# Patient Record
Sex: Male | Born: 1950 | Race: White | Hispanic: No | Marital: Married | Smoking: Current every day smoker
Health system: Southern US, Community
[De-identification: ages and names within clinical notes are randomized; demographics above are authoritative.]

## PROBLEM LIST (undated history)

## (undated) DIAGNOSIS — G8929 Other chronic pain: Secondary | ICD-10-CM

## (undated) DIAGNOSIS — K219 Gastro-esophageal reflux disease without esophagitis: Secondary | ICD-10-CM

## (undated) DIAGNOSIS — J189 Pneumonia, unspecified organism: Secondary | ICD-10-CM

## (undated) DIAGNOSIS — K759 Inflammatory liver disease, unspecified: Secondary | ICD-10-CM

## (undated) DIAGNOSIS — D751 Secondary polycythemia: Secondary | ICD-10-CM

## (undated) DIAGNOSIS — J449 Chronic obstructive pulmonary disease, unspecified: Secondary | ICD-10-CM

## (undated) HISTORY — DX: Gastro-esophageal reflux disease without esophagitis: K21.9

## (undated) HISTORY — DX: Chronic obstructive pulmonary disease, unspecified: J44.9

## (undated) HISTORY — DX: Pneumonia, unspecified organism: J18.9

## (undated) HISTORY — DX: Secondary polycythemia: D75.1

## (undated) HISTORY — DX: Other chronic pain: G89.29

---

## 1996-08-03 HISTORY — PX: OTHER SURGICAL HISTORY: SHX169

## 1999-09-04 ENCOUNTER — Encounter: Payer: Self-pay | Admitting: Orthopedic Surgery

## 1999-09-08 ENCOUNTER — Inpatient Hospital Stay (HOSPITAL_COMMUNITY): Admission: RE | Admit: 1999-09-08 | Discharge: 1999-09-12 | Payer: Self-pay | Admitting: Orthopedic Surgery

## 1999-09-10 ENCOUNTER — Encounter: Payer: Self-pay | Admitting: Orthopedic Surgery

## 2001-04-14 ENCOUNTER — Ambulatory Visit (HOSPITAL_COMMUNITY): Admission: RE | Admit: 2001-04-14 | Discharge: 2001-04-14 | Payer: Self-pay | Admitting: Pulmonary Disease

## 2001-08-03 HISTORY — PX: OTHER SURGICAL HISTORY: SHX169

## 2005-07-08 ENCOUNTER — Ambulatory Visit (HOSPITAL_COMMUNITY): Admission: RE | Admit: 2005-07-08 | Discharge: 2005-07-08 | Payer: Self-pay | Admitting: Pulmonary Disease

## 2009-06-19 ENCOUNTER — Emergency Department (HOSPITAL_COMMUNITY): Admission: EM | Admit: 2009-06-19 | Discharge: 2009-06-19 | Payer: Self-pay | Admitting: Emergency Medicine

## 2010-06-13 ENCOUNTER — Emergency Department (HOSPITAL_COMMUNITY): Admission: EM | Admit: 2010-06-13 | Discharge: 2010-06-13 | Payer: Self-pay | Admitting: Emergency Medicine

## 2010-06-14 ENCOUNTER — Emergency Department (HOSPITAL_COMMUNITY): Admission: EM | Admit: 2010-06-14 | Discharge: 2010-06-14 | Payer: Self-pay | Admitting: Emergency Medicine

## 2010-09-28 ENCOUNTER — Emergency Department (HOSPITAL_COMMUNITY): Payer: Medicaid Other

## 2010-09-28 ENCOUNTER — Emergency Department (HOSPITAL_COMMUNITY)
Admission: EM | Admit: 2010-09-28 | Discharge: 2010-09-28 | Disposition: A | Discharge status: Home / Self Care | Payer: Medicaid Other | Attending: Emergency Medicine | Admitting: Emergency Medicine

## 2010-09-28 DIAGNOSIS — M25519 Pain in unspecified shoulder: Secondary | ICD-10-CM | POA: Insufficient documentation

## 2010-09-28 DIAGNOSIS — J449 Chronic obstructive pulmonary disease, unspecified: Secondary | ICD-10-CM | POA: Insufficient documentation

## 2010-09-28 DIAGNOSIS — W19XXXA Unspecified fall, initial encounter: Secondary | ICD-10-CM | POA: Insufficient documentation

## 2010-09-28 DIAGNOSIS — S4980XA Other specified injuries of shoulder and upper arm, unspecified arm, initial encounter: Secondary | ICD-10-CM | POA: Insufficient documentation

## 2010-09-28 DIAGNOSIS — S46909A Unspecified injury of unspecified muscle, fascia and tendon at shoulder and upper arm level, unspecified arm, initial encounter: Secondary | ICD-10-CM | POA: Insufficient documentation

## 2010-09-28 DIAGNOSIS — Y92009 Unspecified place in unspecified non-institutional (private) residence as the place of occurrence of the external cause: Secondary | ICD-10-CM | POA: Insufficient documentation

## 2010-09-28 DIAGNOSIS — J4489 Other specified chronic obstructive pulmonary disease: Secondary | ICD-10-CM | POA: Insufficient documentation

## 2010-10-14 LAB — DIFFERENTIAL
Lymphocytes Relative: 9 % — ABNORMAL LOW (ref 12–46)
Lymphs Abs: 1.1 10*3/uL (ref 0.7–4.0)
Monocytes Absolute: 1.3 10*3/uL — ABNORMAL HIGH (ref 0.1–1.0)
Monocytes Relative: 10 % (ref 3–12)
Neutro Abs: 10.3 10*3/uL — ABNORMAL HIGH (ref 1.7–7.7)

## 2010-10-14 LAB — BASIC METABOLIC PANEL
GFR calc non Af Amer: >60 mL/min (ref >60)
Glucose, Bld: 117 mg/dL — ABNORMAL HIGH (ref 70–99)
Potassium: 4.1 mEq/L (ref 3.5–5.1)
Sodium: 140 mEq/L (ref 135–145)

## 2010-10-14 LAB — CBC
HCT: 52.1 % — ABNORMAL HIGH (ref 39.0–52.0)
Hemoglobin: 17.7 g/dL — ABNORMAL HIGH (ref 13.0–17.0)
WBC: 12.9 10*3/uL — ABNORMAL HIGH (ref 4.0–10.5)

## 2010-12-04 ENCOUNTER — Encounter: Payer: Self-pay | Admitting: Cardiology

## 2010-12-11 ENCOUNTER — Other Ambulatory Visit: Payer: Self-pay | Admitting: Oncology

## 2010-12-11 ENCOUNTER — Encounter (HOSPITAL_BASED_OUTPATIENT_CLINIC_OR_DEPARTMENT_OTHER): Payer: Medicaid Other | Admitting: Oncology

## 2010-12-11 DIAGNOSIS — D751 Secondary polycythemia: Secondary | ICD-10-CM

## 2010-12-11 LAB — CBC WITH DIFFERENTIAL/PLATELET
Basophils Absolute: 0.1 10*3/uL (ref 0.0–0.1)
EOS%: 2.6 % (ref 0.0–7.0)
HCT: 51.9 % — ABNORMAL HIGH (ref 38.4–49.9)
HGB: 17.9 g/dL — ABNORMAL HIGH (ref 13.0–17.1)
MCH: 35.2 pg — ABNORMAL HIGH (ref 27.2–33.4)
MCV: 101.9 fL — ABNORMAL HIGH (ref 79.3–98.0)
MONO%: 7 % (ref 0.0–14.0)
NEUT%: 69.8 % (ref 39.0–75.0)
lymph#: 2.1 10*3/uL (ref 0.9–3.3)

## 2010-12-12 LAB — COMPREHENSIVE METABOLIC PANEL
Albumin: 4.4 g/dL (ref 3.5–5.2)
Alkaline Phosphatase: 104 U/L (ref 39–117)
BUN: 15 mg/dL (ref 6–23)
CO2: 23 mEq/L (ref 19–32)
Glucose, Bld: 110 mg/dL — ABNORMAL HIGH (ref 70–99)
Potassium: 4.2 mEq/L (ref 3.5–5.3)

## 2010-12-12 LAB — FERRITIN: Ferritin: 107 ng/mL (ref 22–322)

## 2010-12-12 LAB — IRON AND TIBC
Iron: 91 ug/dL (ref 42–165)
UIBC: 222 ug/dL

## 2010-12-12 LAB — ERYTHROPOIETIN: Erythropoietin: 4.5 m[IU]/mL (ref 2.6–34.0)

## 2010-12-16 ENCOUNTER — Encounter: Payer: Self-pay | Admitting: Oncology

## 2010-12-19 ENCOUNTER — Encounter: Payer: Self-pay | Admitting: *Deleted

## 2010-12-19 ENCOUNTER — Other Ambulatory Visit: Payer: Self-pay | Admitting: *Deleted

## 2010-12-19 ENCOUNTER — Ambulatory Visit (INDEPENDENT_AMBULATORY_CARE_PROVIDER_SITE_OTHER): Payer: Medicaid Other | Admitting: Cardiology

## 2010-12-19 ENCOUNTER — Encounter: Payer: Self-pay | Admitting: Cardiology

## 2010-12-19 DIAGNOSIS — Z01818 Encounter for other preprocedural examination: Secondary | ICD-10-CM

## 2010-12-19 DIAGNOSIS — R0602 Shortness of breath: Secondary | ICD-10-CM

## 2010-12-19 DIAGNOSIS — R9431 Abnormal electrocardiogram [ECG] [EKG]: Secondary | ICD-10-CM

## 2010-12-19 DIAGNOSIS — F17200 Nicotine dependence, unspecified, uncomplicated: Secondary | ICD-10-CM

## 2010-12-19 DIAGNOSIS — Z720 Tobacco use: Secondary | ICD-10-CM

## 2010-12-19 DIAGNOSIS — J449 Chronic obstructive pulmonary disease, unspecified: Secondary | ICD-10-CM | POA: Insufficient documentation

## 2010-12-19 NOTE — Progress Notes (Signed)
Clinical Summary Allen Green is a 60 y.o.male referred for cardiology consultation by Dr. Ludwig Clarks secondary to abnormal ECG, also in light of pending elective redo left knee replacement by Dr. Priscille Kluver with [facility].  Limited information is available. He is here with his wife.  He appears chronically ill, citing a long-standing history of tobacco abuse and shortness of breath, probable COPD, history of previous pneumonia. He is not aware of any personal history of CAD or myocardial infarction. Denies any previous stress testing.  He denies any sense of palpitations, has had no syncope, although is occasionally dizzy. He uses a cane to ambulate, and is limited by bilateral knee pain.  ECG is reviewed below. Recent lab work also reviewed. He is noted to be polycythemic, and reports recent followup with Hematology. Likely related to hypoxia, although he has not been formally evaluated by Pulmonary medicine.   No Known Allergies  Current outpatient prescriptions:aspirin 81 MG tablet, Take 81 mg by mouth daily.  , Disp: , Rfl:   Past Medical History  Diagnosis Date  . COPD (chronic obstructive pulmonary disease)     Probable - long-standing tobacco abuse  . Chronic pain     Bilateral knees and shoulders  . Pneumonia     Managed as outpatient  . GERD (gastroesophageal reflux disease)   . 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 Clelia Croft    Past Surgical History  Procedure Date  . Right total knee arthroplasty 2003  . Left total knee arthroplasty 1998    Family History  Problem Relation Age of Onset  . Diabetes type II Father   . Coronary artery disease Father     MI in his 1s    Social History Mr. Bello reports that he has been smoking Cigarettes.  He has a 112.5 pack-year smoking history. He has never used smokeless tobacco. Mr. Bartell reports that he does not drink alcohol.  Review of Systems Chronic lower extremity edema, occasionally uses compression stockings. No reported falls. Reports  stable appetite. No melena or hematochezia. Otherwise reviewed and negative.  Physical Examination Filed Vitals:   12/19/10 1423  BP: 123/83  Pulse: 99  Chronically ill-appearing obese male in no acute distress. HEENT: Conjunctiva and lids normal, oropharynx with poor dentition. Neck: Supple, no elevated JVP or carotid bruits, thyroid. Lungs: Diminished breath sounds throughout, no wheezing. Cardiac: Distant regular heart sounds, indistinct PMI, no S3. Abdomen: Soft, nontender, bowel sounds present. Skin: Warm and dry, distal cyanosis and 1+ edema of the legs distal pulses one plus. Musculoskeletal: No kyphosis. Neuropsychiatric: Alert and oriented x3, affect appropriate.   ECG Sinus rhythm at 98 with possible RVH pattern, vertical axis, anterolateral T wave inversions, QTC 482 ms.   Problem List and Plan

## 2010-12-19 NOTE — Assessment & Plan Note (Signed)
Long-standing significant history. We discussed smoking cessation, although he is not at a point of considering a quit attempt.

## 2010-12-19 NOTE — Assessment & Plan Note (Signed)
Overall nonspecific, however need to exclude underlying ischemic or structural heart disease. Some changes are more reflective of possible pulmonary etiology, perhaps RVH or increased pulmonary pressures. In light of his pending elective knee surgery, further testing as planned.

## 2010-12-19 NOTE — Patient Instructions (Addendum)
Follow up as scheduled. Your physician has requested that you have a lexiscan cardiolite. For further information please visit [email or web address] Please follow instruction sheet, as given. Your physician has requested that you have an echocardiogram. Echocardiography is a painless test that uses sound waves to create images of your heart. It provides your doctor with information about the size and shape of your heart and how well your heart's chambers and valves are working. This procedure takes approximately one hour. There are no restrictions for this procedure. Referral to [facility] in [place]. Your physician recommends that you continue on your current medications as directed. Please refer to the Current Medication list given to you today.

## 2010-12-19 NOTE — Assessment & Plan Note (Signed)
Being considered for redo left knee surgery under general anesthesia, surgery not yet scheduled per patient report. Pulmonary consultation is requested to better understand the patient's COPD diagnosis. We will proceed with further cardiac evaluation including a Lexiscan Cardiolite as well as 2-D echocardiogram for ischemic and structural evaluation. Followup scheduled to discuss with further recommendations to follow.

## 2010-12-19 NOTE — Assessment & Plan Note (Signed)
Presumed diagnosis. No formal PFTs available for review. Patient noted to be polycythemic, possibly hypoxic, and needs formal Pulmonary consultation to better understand his respiratory status, particularly as it relates to pending elective knee surgery, and perioperative risk. Referral made to our Pulmonary division.

## 2010-12-22 ENCOUNTER — Ambulatory Visit (INDEPENDENT_AMBULATORY_CARE_PROVIDER_SITE_OTHER): Payer: Medicaid Other | Admitting: Emergency Medicine

## 2010-12-22 ENCOUNTER — Encounter: Payer: Self-pay | Admitting: Emergency Medicine

## 2010-12-22 DIAGNOSIS — Z720 Tobacco use: Secondary | ICD-10-CM

## 2010-12-22 DIAGNOSIS — Z01818 Encounter for other preprocedural examination: Secondary | ICD-10-CM

## 2010-12-22 DIAGNOSIS — F17200 Nicotine dependence, unspecified, uncomplicated: Secondary | ICD-10-CM

## 2010-12-22 DIAGNOSIS — J449 Chronic obstructive pulmonary disease, unspecified: Secondary | ICD-10-CM

## 2010-12-22 MED ORDER — TIOTROPIUM BROMIDE MONOHYDRATE 18 MCG IN CAPS: 18.0000 ug | ORAL_CAPSULE | Freq: Every day | RESPIRATORY_TRACT | Status: DC
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
## 2010-12-22 NOTE — Patient Instructions (Addendum)
We will perform walking oximetry today We will perform full pulmonary function testing  We will start Spiriva 1 inhalation daily Follow with Dr Diona Browner as planned.  We agreed that you would cut down to 1 pack of cigarettes a day by our next visit.  Follow up with Dr Delton Coombes next available with PFT.

## 2010-12-22 NOTE — Assessment & Plan Note (Signed)
He states that he would like to quit. Has attempted before.  - agreed to cut down to 1pk/day by next visit, then we will push for further decreases/.

## 2010-12-22 NOTE — Progress Notes (Signed)
SATURATION QUALIFICATIONS:  Patient Saturations on Room Air at Rest = 90%  Patient Saturations on Room Air while Ambulating = 83%  Patient Saturations on 2 Liters of oxygen while Ambulating = 91%

## 2010-12-22 NOTE — Assessment & Plan Note (Signed)
Walking oximetry today Smoking cessation discussed Start Spiriva qd Full PFT to assess degree of AFL ROV next available

## 2010-12-22 NOTE — Assessment & Plan Note (Signed)
Suspect that his degree of COPD will make him high risk for any surgery requiring general anaesthesia.

## 2010-12-31 ENCOUNTER — Telehealth: Payer: Self-pay | Admitting: *Deleted

## 2010-12-31 NOTE — Telephone Encounter (Signed)
?   percert for [employer] and 2 D Echo set for 01-01-2011 @ [facility]

## 2010-12-31 NOTE — Telephone Encounter (Signed)
NO PRECERT REQUIRED THROUGH MEDICAID FOR LEXISCAN OR 2D ECHO SCHEDULED FOR 01/01/11

## 2011-01-07 ENCOUNTER — Ambulatory Visit (INDEPENDENT_AMBULATORY_CARE_PROVIDER_SITE_OTHER): Payer: Medicaid Other | Admitting: Emergency Medicine

## 2011-01-07 ENCOUNTER — Encounter: Payer: Self-pay | Admitting: Emergency Medicine

## 2011-01-07 DIAGNOSIS — Z01818 Encounter for other preprocedural examination: Secondary | ICD-10-CM

## 2011-01-07 DIAGNOSIS — J449 Chronic obstructive pulmonary disease, unspecified: Secondary | ICD-10-CM

## 2011-01-07 DIAGNOSIS — F17200 Nicotine dependence, unspecified, uncomplicated: Secondary | ICD-10-CM

## 2011-01-07 DIAGNOSIS — Z720 Tobacco use: Secondary | ICD-10-CM

## 2011-01-07 LAB — PULMONARY FUNCTION TEST

## 2011-01-07 MED ORDER — TIOTROPIUM BROMIDE MONOHYDRATE 18 MCG IN CAPS: 18.0000 ug | ORAL_CAPSULE | Freq: Every day | RESPIRATORY_TRACT | Status: DC

## 2011-01-07 NOTE — Assessment & Plan Note (Signed)
We will restart and continue Spiriva Start albuterol prn Continue O2 Check a1AT testing ROV 2 months Letter to Dr Pilar Grammes about COPD and increased risk for sgy/.

## 2011-01-07 NOTE — Assessment & Plan Note (Signed)
Mr Jakes is at increased risk for prolonged ventilation, respiratory complications, or death if he were to undergo any surgery under general anaesthesia. This does not preclude surgery but must be considered when weighing risks/benefits for his knee surgery.

## 2011-01-07 NOTE — Progress Notes (Signed)
PFT done today. 

## 2011-01-07 NOTE — Assessment & Plan Note (Signed)
He will continue to attempt to cut down

## 2011-01-07 NOTE — Progress Notes (Signed)
  Subjective:    Patient ID: Allen Green, male    DOB: 14-Jun-1951, 60 y.o.   MRN: [id number]  HPI 60 yo man with long tobacco hx, still smoking 2 pks a day. He has hx polycythemia, ? Etiology. Also OA with knee, shoulder and back pain. He is referred for severe exertional SOB. Has been progressive over several years. He has daily cough, some white phlegm. He hears wheezing  - often when exerting or when supine. He has had episodes of what sound like AE-COPD +/- pneumonia, never hospitalized but has had Prednisone and abx before. He has been on Spiriva before - he got it from a friend! Has also been rx with albuterol. He is interested in stopping smoking. He is being evaluated by Dr Pilar Grammes for possible re-do L knee surgery. He is planning to undergo cardiac stress testing under direction of Dr Wyatt Portela 01/07/11 -- follows up for tobacco, COPD, hypoxemia. He tells me that he has cut down cigs significantly - has smoked 1 pk total in 4 days. He is scheduled to get cardiac testing on 01/14/11 with Dr Diona Browner. Last time we started oxygen at 2L/min - he feels that it has helped his exertional tolerance. We started Spiriva x 10 days and he though it probably helped some, doesn't really miss it now that sample has run out. Doesn't have a SABA.   Review of Systems   Objective:   Physical Exam Gen: Pleasant, obese, in no distress,  normal affect  ENT: No lesions,  mouth clear,  oropharynx clear, no postnasal drip, coarse voice  Neck: No JVD, no TMG, no carotid bruits  Lungs: No use of accessory muscles, no dullness to percussion, soft B exp wheeze  Cardiovascular: RRR, heart sounds normal, no murmur or gallops, no peripheral edema  Musculoskeletal: No deformities, no cyanosis or clubbing  Neuro: alert, non focal  Skin: Warm, no lesions or rashes        Assessment & Plan:

## 2011-01-13 ENCOUNTER — Telehealth: Payer: Self-pay | Admitting: *Deleted

## 2011-01-13 NOTE — Telephone Encounter (Signed)
Pt scheduled for Lexiscan and Echo tomorrow 6/13. According to his order he was also previously scheduled for 5/31 and 6/6. Pt and his wife state he was only scheduled for 6/6 and 6/13. Pt is out of town and would like to r/s test. Lanora Manis at [facility] notified. Pt is aware Lynden Ang will call to r/s test when she returns next week.

## 2011-01-27 ENCOUNTER — Encounter: Payer: Self-pay | Admitting: Emergency Medicine

## 2011-01-27 NOTE — Telephone Encounter (Signed)
Allen Green r/s test to 7/2. She mailed information to notify pt as she was unable to reach him.

## 2011-02-06 ENCOUNTER — Encounter (INDEPENDENT_AMBULATORY_CARE_PROVIDER_SITE_OTHER): Payer: Medicaid Other | Admitting: Cardiology

## 2011-02-06 ENCOUNTER — Encounter: Payer: Self-pay | Admitting: Cardiology

## 2011-02-06 NOTE — Progress Notes (Deleted)
Clinical Summary Mr. Bradwell is a 60 y.o.male presenting for followup. He was seen in May.   No Known Allergies  Current outpatient prescriptions:aspirin 81 MG tablet, Take 81 mg by mouth daily.  , Disp: , Rfl: ;  tiotropium (SPIRIVA HANDIHALER) 18 MCG inhalation capsule, Place 1 capsule (18 mcg total) into inhaler and inhale daily., Disp: 30 capsule, Rfl: 12  Past Medical History  Diagnosis Date  . COPD (chronic obstructive pulmonary disease)     Probable - long-standing tobacco abuse  . Chronic pain     Bilateral knees and shoulders  . Pneumonia     Managed as outpatient  . GERD (gastroesophageal reflux disease)   . 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 Clelia Croft    Past Surgical History  Procedure Date  . Right total knee arthroplasty 2003  . Left total knee arthroplasty 1998    Family History  Problem Relation Age of Onset  . Diabetes type II Father   . Coronary artery disease Father     MI in his 75s  . Diabetes Mother   . Diabetes Brother   . Diabetes Sister   . Asthma Sister     Social History Mr. Pollina reports that he has been smoking Cigarettes.  He has a 90 pack-year smoking history. He has never used smokeless tobacco. Mr. Carelock reports that he does not drink alcohol.  Review of Systems   Physical Examination There were no vitals filed for this visit. Chronically ill-appearing obese male in no acute distress.  HEENT: Conjunctiva and lids normal, oropharynx with poor dentition.  Neck: Supple, no elevated JVP or carotid bruits, thyroid.  Lungs: Diminished breath sounds throughout, no wheezing.  Cardiac: Distant regular heart sounds, indistinct PMI, no S3.  Abdomen: Soft, nontender, bowel sounds present.  Skin: Warm and dry, distal cyanosis and 1+ edema of the legs distal pulses one plus.  Musculoskeletal: No kyphosis.  Neuropsychiatric: Alert and oriented x3, affect appropriate.   ECG   Studies   Problem List and Plan

## 2011-02-06 NOTE — Progress Notes (Signed)
This encounter was created in error - please disregard.

## 2011-02-12 DIAGNOSIS — R0602 Shortness of breath: Secondary | ICD-10-CM

## 2011-02-18 ENCOUNTER — Telehealth: Payer: Self-pay | Admitting: *Deleted

## 2011-02-18 NOTE — Telephone Encounter (Signed)
Anticipate full review of 2D echo and Cardiolite results tomorrow, when Dr Diona Browner returns to office. Prelim review suggests NL LVF and a low risk stress test.

## 2011-02-20 NOTE — Telephone Encounter (Signed)
Pt notified of results and verbalized understanding  

## 2011-02-20 NOTE — Telephone Encounter (Signed)
Attempted to reach pt regarding results. No answer, no voicemail.  

## 2011-03-23 ENCOUNTER — Telehealth: Payer: Self-pay | Admitting: *Deleted

## 2011-03-23 NOTE — Telephone Encounter (Signed)
Pt's dgt called regarding results of test. She states her father told her someone had called, but he did not know what they said regarding results.   Pt's dgt notified regarding echo and stress test results.

## 2011-04-09 ENCOUNTER — Ambulatory Visit (HOSPITAL_COMMUNITY)
Admission: RE | Admit: 2011-04-09 | Discharge: 2011-04-09 | Disposition: A | Discharge status: Home / Self Care | Payer: Medicaid Other | Source: Ambulatory Visit | Attending: Family Medicine | Admitting: Family Medicine

## 2011-04-09 DIAGNOSIS — M79609 Pain in unspecified limb: Secondary | ICD-10-CM

## 2011-04-09 DIAGNOSIS — J449 Chronic obstructive pulmonary disease, unspecified: Secondary | ICD-10-CM | POA: Insufficient documentation

## 2011-04-09 DIAGNOSIS — J4489 Other specified chronic obstructive pulmonary disease: Secondary | ICD-10-CM | POA: Insufficient documentation

## 2011-04-09 DIAGNOSIS — F17200 Nicotine dependence, unspecified, uncomplicated: Secondary | ICD-10-CM | POA: Insufficient documentation

## 2011-04-14 ENCOUNTER — Encounter: Payer: Self-pay | Admitting: Emergency Medicine

## 2011-04-14 ENCOUNTER — Ambulatory Visit (INDEPENDENT_AMBULATORY_CARE_PROVIDER_SITE_OTHER): Payer: Medicaid Other | Admitting: Emergency Medicine

## 2011-04-14 VITALS — BP 106/78 | HR 97 | Temp 97.6°F | Ht 71.0 in | Wt 270.8 lb

## 2011-04-14 DIAGNOSIS — J449 Chronic obstructive pulmonary disease, unspecified: Secondary | ICD-10-CM

## 2011-04-14 MED ORDER — BUDESONIDE-FORMOTEROL FUMARATE 160-4.5 MCG/ACT IN AERO: 2.0000 | INHALATION_SPRAY | Freq: Two times a day (BID) | RESPIRATORY_TRACT | Status: DC

## 2011-04-14 MED ORDER — ALBUTEROL SULFATE HFA 108 (90 BASE) MCG/ACT IN AERS: 2.0000 | INHALATION_SPRAY | RESPIRATORY_TRACT | Status: DC | PRN

## 2011-04-14 NOTE — Patient Instructions (Signed)
Continue your Spiriva daily Continue your oxygen at all times Start Symbicort 160/4.86mcg 2 puffs twice a day Start albuterol 2 puffs if needed for shortness of breath Follow up with Dr Delton Coombes in 1 month Your lung disease makes you high risk for any surgical procedure, but does not preclude a surgery if the benefits outweigh these risks.

## 2011-04-14 NOTE — Progress Notes (Signed)
  Subjective:    Patient ID: Allen Green, male    DOB: 1950-09-04, 60 y.o.   MRN: [id number]  HPI 60 yo man with long tobacco hx, still smoking 2 pks a day. He has hx polycythemia, ? Etiology. Also OA with knee, shoulder and back pain. He is referred for severe exertional SOB. Has been progressive over several years. He has daily cough, some white phlegm. He hears wheezing  - often when exerting or when supine. He has had episodes of what sound like AE-COPD +/- pneumonia, never hospitalized but has had Prednisone and abx before. He has been on Spiriva before - he got it from a friend! Has also been rx with albuterol. He is interested in stopping smoking. He is being evaluated by Dr Pilar Grammes for possible re-do L knee surgery. He is planning to undergo cardiac stress testing under direction of Dr Wyatt Portela 01/07/11 -- follows up for tobacco, COPD, hypoxemia. He tells me that he has cut down cigs significantly - has smoked 1 pk total in 4 days. He is scheduled to get cardiac testing on 01/14/11 with Dr Diona Browner. Last time we started oxygen at 2L/min - he feels that it has helped his exertional tolerance. We started Spiriva x 10 days and he though it probably helped some, doesn't really miss it now that sample has run out. Doesn't have a SABA.   ROV 04/14/11 -- follows up for tobacco, COPD, hypoxemia. He tells me that he has been inconsistent with the Spiriva but that he does it most days. He can tell that it is helping him. He is wearing his oxygen to sleep, wears at home but doesn';t always wear it when he is out, but he knows he misses it. He still has significant exertional dyspnea. He is back up to a pack a day of cigarettes. He wants to quit, but doesn't have a plan.     Objective:   Physical Exam Gen: Pleasant, obese, in no distress,  normal affect  ENT: No lesions,  mouth clear,  oropharynx clear, no postnasal drip, coarse voice  Neck: No JVD, no TMG, no carotid bruits  Lungs: No use of accessory  muscles, no dullness to percussion, soft B exp wheeze  Cardiovascular: RRR, heart sounds normal, no murmur or gallops, no peripheral edema  Musculoskeletal: No deformities, no cyanosis or clubbing  Neuro: alert, non focal  Skin: Warm, no lesions or rashes      Assessment & Plan:  COPD (chronic obstructive pulmonary disease) - discussed smoking cessation, not ready to stop so we will set goal < 1/2 pk a day - continue spiriva - start symbicort - SABA prn, needs script - O2, need to work on compliance - he is high risk for any sgy but this does not preclude sgy  - Send note to Dr Priscille Kluver with ortho

## 2011-04-14 NOTE — Assessment & Plan Note (Addendum)
-   discussed smoking cessation, not ready to stop so we will set goal < 1/2 pk a day - continue spiriva - start symbicort - SABA prn, needs script - O2, need to work on compliance - he is high risk for any sgy but this does not preclude sgy  - Send note to Dr Priscille Kluver with ortho

## 2011-04-23 ENCOUNTER — Other Ambulatory Visit: Payer: Self-pay | Admitting: Oncology

## 2011-04-23 ENCOUNTER — Encounter (HOSPITAL_BASED_OUTPATIENT_CLINIC_OR_DEPARTMENT_OTHER): Payer: Medicaid Other | Admitting: Oncology

## 2011-04-23 ENCOUNTER — Other Ambulatory Visit (HOSPITAL_COMMUNITY): Payer: Self-pay | Admitting: Family Medicine

## 2011-04-23 DIAGNOSIS — F17200 Nicotine dependence, unspecified, uncomplicated: Secondary | ICD-10-CM

## 2011-04-23 DIAGNOSIS — R0902 Hypoxemia: Secondary | ICD-10-CM

## 2011-04-23 DIAGNOSIS — D751 Secondary polycythemia: Secondary | ICD-10-CM

## 2011-04-23 DIAGNOSIS — R9389 Abnormal findings on diagnostic imaging of other specified body structures: Secondary | ICD-10-CM

## 2011-04-23 DIAGNOSIS — J449 Chronic obstructive pulmonary disease, unspecified: Secondary | ICD-10-CM

## 2011-04-23 LAB — CBC WITH DIFFERENTIAL/PLATELET
BASO%: 0.3 % (ref 0.0–2.0)
Eosinophils Absolute: 0.2 10*3/uL (ref 0.0–0.5)
MCHC: 35 g/dL (ref 32.0–36.0)
MONO#: 0.7 10*3/uL (ref 0.1–0.9)
NEUT#: 10.3 10*3/uL — ABNORMAL HIGH (ref 1.5–6.5)
Platelets: 248 10*3/uL (ref 140–400)
RBC: 4.86 10*6/uL (ref 4.20–5.82)
RDW: 14.6 % (ref 11.0–14.6)
WBC: 13.1 10*3/uL — ABNORMAL HIGH (ref 4.0–10.3)
lymph#: 1.8 10*3/uL (ref 0.9–3.3)

## 2011-04-27 ENCOUNTER — Ambulatory Visit (HOSPITAL_COMMUNITY)
Admission: RE | Admit: 2011-04-27 | Discharge: 2011-04-27 | Disposition: A | Discharge status: Home / Self Care | Payer: Medicaid Other | Source: Ambulatory Visit | Attending: Family Medicine | Admitting: Family Medicine

## 2011-04-27 DIAGNOSIS — F17200 Nicotine dependence, unspecified, uncomplicated: Secondary | ICD-10-CM

## 2011-04-27 DIAGNOSIS — R9389 Abnormal findings on diagnostic imaging of other specified body structures: Secondary | ICD-10-CM

## 2011-04-27 DIAGNOSIS — J438 Other emphysema: Secondary | ICD-10-CM | POA: Insufficient documentation

## 2011-04-27 DIAGNOSIS — I2510 Atherosclerotic heart disease of native coronary artery without angina pectoris: Secondary | ICD-10-CM | POA: Insufficient documentation

## 2011-04-27 DIAGNOSIS — J449 Chronic obstructive pulmonary disease, unspecified: Secondary | ICD-10-CM

## 2011-04-27 DIAGNOSIS — I2789 Other specified pulmonary heart diseases: Secondary | ICD-10-CM | POA: Insufficient documentation

## 2011-04-27 MED ORDER — IOHEXOL 300 MG/ML  SOLN
100.0000 mL | Freq: Once | INTRAMUSCULAR | Status: AC | PRN
  Administered 2011-04-27: 100 mL via INTRAVENOUS

## 2011-06-01 ENCOUNTER — Ambulatory Visit: Payer: Medicaid Other | Admitting: Emergency Medicine

## 2011-06-08 ENCOUNTER — Ambulatory Visit: Payer: Medicaid Other | Admitting: Emergency Medicine

## 2011-06-22 ENCOUNTER — Ambulatory Visit (INDEPENDENT_AMBULATORY_CARE_PROVIDER_SITE_OTHER): Payer: Medicaid Other | Admitting: Emergency Medicine

## 2011-06-22 ENCOUNTER — Encounter: Payer: Self-pay | Admitting: Emergency Medicine

## 2011-06-22 VITALS — BP 120/72 | HR 89 | Temp 97.8°F | Ht 71.0 in | Wt 259.8 lb

## 2011-06-22 DIAGNOSIS — J449 Chronic obstructive pulmonary disease, unspecified: Secondary | ICD-10-CM

## 2011-06-22 NOTE — Progress Notes (Signed)
  Subjective:    Patient ID: Allen Green, male    DOB: 1951/03/28, 60 y.o.   MRN: [id number]  HPI 60 yo man with long tobacco hx, still smoking 2 pks a day. He has hx polycythemia, ? Etiology. Also OA with knee, shoulder and back pain. He is referred for severe exertional SOB. Has been progressive over several years. He has daily cough, some white phlegm. He hears wheezing  - often when exerting or when supine. He has had episodes of what sound like AE-COPD +/- pneumonia, never hospitalized but has had Prednisone and abx before. He has been on Spiriva before - he got it from a friend! Has also been rx with albuterol. He is interested in stopping smoking. He is being evaluated by Dr Pilar Grammes for possible re-do L knee surgery. He is planning to undergo cardiac stress testing under direction of Dr Wyatt Portela 01/07/11 -- follows up for tobacco, COPD, hypoxemia. He tells me that he has cut down cigs significantly - has smoked 1 pk total in 4 days. He is scheduled to get cardiac testing on 01/14/11 with Dr Diona Browner. Last time we started oxygen at 2L/min - he feels that it has helped his exertional tolerance. We started Spiriva x 10 days and he though it probably helped some, doesn't really miss it now that sample has run out. Doesn't have a SABA.   ROV 04/14/11 -- follows up for tobacco, COPD, hypoxemia. He tells me that he has been inconsistent with the Spiriva but that he does it most days. He can tell that it is helping him. He is wearing his oxygen to sleep, wears at home but doesn';t always wear it when he is out, but he knows he misses it. He still has significant exertional dyspnea. He is back up to a pack a day of cigarettes. He wants to quit, but doesn't have a plan.   ROV 06/22/11 -- 60 yom, follows up for tobacco, COPD, hypoxemia. Still smoking 1 pk/day. Pretty good about wearing his O2 set on 2L/min, but noticed some dry throat. He has had some URI sx last 2 weeks, more cough with white mucous. Last time  we added Symbicort to Spiriva - ? Whether the symbicort is causing throat irritation. He does feel that the breathing is better. He wants to quit smoking - we will make New Years Day our target.     Objective:   Physical Exam Gen: Pleasant, obese, in no distress,  normal affect  ENT: No lesions,  mouth clear,  oropharynx clear, no postnasal drip, coarse voice  Neck: No JVD, no TMG, no carotid bruits  Lungs: No use of accessory muscles, no dullness to percussion, soft B exp wheeze  Cardiovascular: RRR, heart sounds normal, no murmur or gallops, no peripheral edema  Musculoskeletal: No deformities, no cyanosis or clubbing  Neuro: alert, non focal  Skin: Warm, no lesions or rashes   Assessment & Plan:  COPD (chronic obstructive pulmonary disease) Has a URI currently, but no evidence exacerbation. Reviewed w him that he is at risk for this. Also discussed tobacco in detail, plan for stopping. We will set quit date 08/04/2011. I will see him prior to consider meds.  - same BD's - tobacco cessation plan as above, meet late Dec to finalize - O2 - will forward this note to dr Priscille Kluver

## 2011-06-22 NOTE — Assessment & Plan Note (Signed)
Has a URI currently, but no evidence exacerbation. Reviewed w him that he is at risk for this. Also discussed tobacco in detail, plan for stopping. We will set quit date 08/04/2011. I will see him prior to consider meds.  - same BD's - tobacco cessation plan as above, meet late Dec to finalize - O2 - will forward this note to dr Priscille Kluver

## 2011-06-22 NOTE — Patient Instructions (Signed)
Please continue your current inhaled medications - Spiriva and Symbicort We discussed stopping smoking. We will plan for quit date of 08/04/2011.  Follow with Dr Delton Coombes in late December to rediscuss your cigarettes.  Call our office if you have any changes in your breathing, wheezing or mucous production.

## 2011-07-09 ENCOUNTER — Telehealth: Payer: Self-pay | Admitting: Emergency Medicine

## 2011-07-09 NOTE — Telephone Encounter (Signed)
LMTCB

## 2011-07-10 NOTE — Telephone Encounter (Signed)
Spoke with pt's spouse. She states that pt is filing for disability through social security and needs a letter from RB stating when he started being txed here, his dx and tx and prognosis. She states that she will pick up the letter once it's ready. I advised that RB out of the office until 07/13/11 and will forward him the msg. She verbalized understanding.

## 2011-07-15 NOTE — Telephone Encounter (Signed)
Pt's daughter called in to check on status of letter of disability & asked [place] to call pt's wife at [phone] to give an update.  Antionette Fairy

## 2011-07-16 NOTE — Telephone Encounter (Signed)
Pt;'s spouse is aware that the letter is not ready. She says she specifically asked that the date of Fri., 12/14 be put in the msg because this is when the letter is needed. It needs to state when the pt was first seen by RB, pt's diagnosis, prognosis, all dates of service and whether RB feels this pt should be on disability. I will forward this msg back to RB and also hold in my inbox as a reminder to speak with RB about it this afternoon when he is in the office.

## 2011-07-17 ENCOUNTER — Encounter: Payer: Self-pay | Admitting: Emergency Medicine

## 2011-07-17 NOTE — Telephone Encounter (Signed)
Pt and spouse aware letter is ready for pick up and has been placed up front.

## 2011-07-17 NOTE — Telephone Encounter (Signed)
Letter done

## 2011-07-24 ENCOUNTER — Encounter: Payer: Self-pay | Admitting: Emergency Medicine

## 2011-07-24 ENCOUNTER — Ambulatory Visit (INDEPENDENT_AMBULATORY_CARE_PROVIDER_SITE_OTHER): Payer: Medicaid Other | Admitting: Emergency Medicine

## 2011-07-24 DIAGNOSIS — J449 Chronic obstructive pulmonary disease, unspecified: Secondary | ICD-10-CM

## 2011-07-24 DIAGNOSIS — F17200 Nicotine dependence, unspecified, uncomplicated: Secondary | ICD-10-CM

## 2011-07-24 DIAGNOSIS — Z720 Tobacco use: Secondary | ICD-10-CM

## 2011-07-24 MED ORDER — VARENICLINE TARTRATE 0.5 MG X 11 & 1 MG X 42 PO MISC: ORAL | Status: AC

## 2011-07-24 MED ORDER — VARENICLINE TARTRATE 1 MG PO TABS: 1.0000 mg | ORAL_TABLET | Freq: Two times a day (BID) | ORAL | Status: AC

## 2011-07-24 MED ORDER — AZITHROMYCIN 250 MG PO TABS: ORAL_TABLET | ORAL | Status: AC

## 2011-07-24 NOTE — Progress Notes (Signed)
  Subjective:    Patient ID: Allen Green, male    DOB: [birth year], 59 y.o.   MRN: [id number]  HPI 60 yo man with long tobacco hx, still smoking 2 pks a day. He has hx polycythemia, ? Etiology. Also OA with knee, shoulder and back pain. He is referred for severe exertional SOB. Has been progressive over several years. He has daily cough, some white phlegm. He hears wheezing  - often when exerting or when supine. He has had episodes of what sound like AE-COPD +/- pneumonia, never hospitalized but has had Prednisone and abx before. He has been on Spiriva before - he got it from a friend! Has also been rx with albuterol. He is interested in stopping smoking. He is being evaluated by Dr Pilar Grammes for possible re-do L knee surgery. He is planning to undergo cardiac stress testing under direction of Dr Wyatt Portela 01/07/11 -- follows up for tobacco, COPD, hypoxemia. He tells me that he has cut down cigs significantly - has smoked 1 pk total in 4 days. He is scheduled to get cardiac testing on 01/14/11 with Dr Diona Browner. Last time we started oxygen at 2L/min - he feels that it has helped his exertional tolerance. We started Spiriva x 10 days and he though it probably helped some, doesn't really miss it now that sample has run out. Doesn't have a SABA.   ROV 04/14/11 -- follows up for tobacco, COPD, hypoxemia. He tells me that he has been inconsistent with the Spiriva but that he does it most days. He can tell that it is helping him. He is wearing his oxygen to sleep, wears at home but doesn';t always wear it when he is out, but he knows he misses it. He still has significant exertional dyspnea. He is back up to a pack a day of cigarettes. He wants to quit, but doesn't have a plan.   ROV 06/22/11 -- 60 yom, follows up for tobacco, COPD, hypoxemia. Still smoking 1 pk/day. Pretty good about wearing his O2 set on 2L/min, but noticed some dry throat. He has had some URI sx last 2 weeks, more cough with white mucous. Last time  we added Symbicort to Spiriva - ? Whether the symbicort is causing throat irritation. He does feel that the breathing is better. He wants to quit smoking - we will make New Years Day our target.   ROV 07/24/11 -- COPD, CAD and hypoxemia, tobacco use.  He has been having more cough, more sputum - yellow/brown. No blood. Breathing is stable. Looking to have leg surgery at [facility]: [facility]. Stable on Symbicort and Spiriva. O2 on 2L/min - 3L/min.     Objective:   Physical Exam Gen: Pleasant, obese, in no distress,  normal affect  ENT: No lesions,  mouth clear,  oropharynx clear, no postnasal drip, coarse voice  Neck: No JVD, no TMG, no carotid bruits  Lungs: No use of accessory muscles, no dullness to percussion, no wheeze  Cardiovascular: RRR, heart sounds normal, no murmur or gallops, no peripheral edema  Musculoskeletal: No deformities, no cyanosis or clubbing  Neuro: alert, non focal  Skin: Warm, no lesions or rashes   Assessment & Plan:  COPD (chronic obstructive pulmonary disease) Will treat with azithro for possible bronchitis Continue maintenance meds  Tobacco abuse Will use chantix, quit date is 08/04/11

## 2011-07-24 NOTE — Assessment & Plan Note (Signed)
Will use chantix, quit date is 08/04/11

## 2011-07-24 NOTE — Patient Instructions (Signed)
Take azithromycin as directed Take chantix starter pack as directed until completed, then fill the chantix maintenance pack.  Your Tobacco quit date is 08/04/2011 Continue your inhaled medications as you are taking them

## 2011-07-24 NOTE — Assessment & Plan Note (Signed)
Will treat with azithro for possible bronchitis Continue maintenance meds

## 2011-08-27 ENCOUNTER — Telehealth: Payer: Self-pay | Admitting: Oncology

## 2011-08-27 NOTE — Telephone Encounter (Signed)
S/w the pt and he is aware of his march 2013 appts 

## 2011-09-21 ENCOUNTER — Telehealth: Payer: Self-pay | Admitting: Emergency Medicine

## 2011-09-21 MED ORDER — DOXYCYCLINE HYCLATE 100 MG PO TABS: 100.0000 mg | ORAL_TABLET | Freq: Two times a day (BID) | ORAL | Status: AC

## 2011-09-21 MED ORDER — PREDNISONE 10 MG PO TABS: ORAL_TABLET | ORAL | Status: DC

## 2011-09-21 NOTE — Telephone Encounter (Signed)
I spoke with pt and he c/o coughing up dark brown phlem, wheezing, chest tightness, and some increase SOB w/ exertion x 3 days. Denies any fever, chills, sweats, nausea, vomiting, body aches, sore throat. I offered pt OV but refused and stated he was out of town currently. Pt is requesting to have prednisone called in for him. Pt would like RB to address this when he comes in this afternoon. Please advise Dr. Delton Coombes. Thanks  No Known Allergies   [facility]

## 2011-09-21 NOTE — Telephone Encounter (Signed)
I spoke with pt and is aware of RB recs. He voiced his understanding and rx has been sent to the pharmacy. Nothing further was needed

## 2011-09-21 NOTE — Telephone Encounter (Signed)
Please call in script for doxy 100 bid x 7 days, pred taper as follows: Take 40mg  daily for 3 days, then 30mg  daily for 3 days, then 20mg  daily for 3 days, then 10mg  daily for 3 days, then stop

## 2011-10-21 ENCOUNTER — Other Ambulatory Visit: Payer: Medicaid Other | Admitting: Lab

## 2011-10-21 ENCOUNTER — Ambulatory Visit: Payer: Medicaid Other | Admitting: Oncology

## 2011-10-21 ENCOUNTER — Telehealth: Payer: Self-pay | Admitting: Oncology

## 2011-10-21 NOTE — Telephone Encounter (Signed)
Pt came in ans had the wrong appt time and could not keep this pm appt.  appt r/s to 3/27   And printed for pt

## 2011-10-28 ENCOUNTER — Ambulatory Visit (HOSPITAL_BASED_OUTPATIENT_CLINIC_OR_DEPARTMENT_OTHER): Payer: Medicaid Other | Admitting: Oncology

## 2011-10-28 ENCOUNTER — Other Ambulatory Visit: Payer: Medicaid Other | Admitting: Lab

## 2011-10-28 ENCOUNTER — Telehealth: Payer: Self-pay | Admitting: Oncology

## 2011-10-28 ENCOUNTER — Encounter: Payer: Self-pay | Admitting: Oncology

## 2011-10-28 VITALS — BP 124/79 | HR 93 | Temp 97.5°F | Ht 71.0 in | Wt 261.0 lb

## 2011-10-28 DIAGNOSIS — D751 Secondary polycythemia: Secondary | ICD-10-CM

## 2011-10-28 DIAGNOSIS — R0902 Hypoxemia: Secondary | ICD-10-CM

## 2011-10-28 DIAGNOSIS — F17200 Nicotine dependence, unspecified, uncomplicated: Secondary | ICD-10-CM

## 2011-10-28 LAB — CBC WITH DIFFERENTIAL/PLATELET
BASO%: 1.3 % (ref 0.0–2.0)
Eosinophils Absolute: 0.3 10*3/uL (ref 0.0–0.5)
HCT: 50.4 % — ABNORMAL HIGH (ref 38.4–49.9)
HGB: 17.1 g/dL (ref 13.0–17.1)
LYMPH%: 25.5 % (ref 14.0–49.0)
MONO#: 0.8 10*3/uL (ref 0.1–0.9)
NEUT#: 7.7 10*3/uL — ABNORMAL HIGH (ref 1.5–6.5)
NEUT%: 63.9 % (ref 39.0–75.0)
Platelets: 300 10*3/uL (ref 140–400)
WBC: 12 10*3/uL — ABNORMAL HIGH (ref 4.0–10.3)
lymph#: 3.1 10*3/uL (ref 0.9–3.3)

## 2011-10-28 LAB — COMPREHENSIVE METABOLIC PANEL
ALT: 16 U/L (ref 0–53)
Albumin: 4.3 g/dL (ref 3.5–5.2)
CO2: 25 mEq/L (ref 19–32)
Chloride: 104 mEq/L (ref 96–112)
Creatinine, Ser: 0.91 mg/dL (ref 0.50–1.35)
Glucose, Bld: 95 mg/dL (ref 70–99)
Total Bilirubin: 0.3 mg/dL (ref 0.3–1.2)

## 2011-10-28 NOTE — Telephone Encounter (Signed)
gv wife appt schedule for sept.  °

## 2011-10-28 NOTE — Progress Notes (Signed)
Hematology and Oncology Follow Up Visit  Allen Green [id number] 01-18-1951 61 y.o. 10/28/2011 3:42 PM Allen Medal, MD, MDMoreira, [name], MD   Principle Diagnosis: This is a 61 year old gentleman with polycythemia, most likely reactive, related to his lung disease.  Current Therapy: Observation  Interim History:  Allen Green is a 61 year-old gentleman who returns to the clinic today with his wife for routine follow-up. He has COPD and smokes 1.5 packs of cigarettes daily. Wears O2 all of the time per his report (not wearing today in the clinic). The O2 use has helped his symptomatology.  He does have some chronic knee pain again and stated that he is due to follow-up at [facility] for possible surgery to his left knee. He otherwise has no problems today.    Medications: I have reviewed the patient's current medications. Current outpatient prescriptions:albuterol (PROVENTIL HFA) 108 (90 BASE) MCG/ACT inhaler, Inhale 2 puffs into the lungs every 4 (four) hours as needed for wheezing or shortness of breath., Disp: 1 Inhaler, Rfl: 5;  aspirin 81 MG tablet, Take 81 mg by mouth daily.  , Disp: , Rfl: ;  budesonide-formoterol (SYMBICORT) 160-4.5 MCG/ACT inhaler, Inhale 2 puffs into the lungs 2 (two) times daily., Disp: 1 Inhaler, Rfl: 11 metformin (FORTAMET) 500 MG (OSM) 24 hr tablet, Take 500 mg by mouth daily with breakfast.  , Disp: , Rfl: ;  tiotropium (SPIRIVA HANDIHALER) 18 MCG inhalation capsule, Place 1 capsule (18 mcg total) into inhaler and inhale daily., Disp: 30 capsule, Rfl: 12;  celecoxib (CELEBREX) 100 MG capsule, Take 100 mg by mouth 2 (two) times daily as needed. , Disp: , Rfl:  predniSONE (DELTASONE) 10 MG tablet, Take 40 mg x 3 days, 30 mg x 3 days, 20 mg x 3 days, 10 mg x 3 days then stop, Disp: 30 tablet, Rfl: 0  Allergies: No Known Allergies  Past Medical History, Surgical history, Social history, and Family History were reviewed and updated.  Review of Systems: Constitutional:   Negative for fever, chills, night sweats, anorexia, weight loss, pain. Cardiovascular: no chest pain or dyspnea on exertion Respiratory: positive for - cough Neurological: no TIA or stroke symptoms negative Dermatological: negative ENT: negative Skin: Negative. Gastrointestinal: no abdominal pain, change in bowel habits, or black or bloody stools Genito-Urinary: no dysuria, trouble voiding, or hematuria Hematological and Lymphatic: negative Breast: negative for breast lumps Musculoskeletal: positive for - joint pain Remaining ROS negative. Physical Exam: Blood pressure 124/79, pulse 93, temperature 97.5 F (36.4 C), temperature source Oral, height 5\' 11"  (1.803 m), weight 261 lb (118.389 kg). ECOG:  General appearance: alert, cooperative and no distress Head: Normocephalic, without obvious abnormality, atraumatic Neck: no adenopathy, no carotid bruit, no JVD, supple, symmetrical, trachea midline and thyroid not enlarged, symmetric, no tenderness/mass/nodules Lymph nodes: Cervical, supraclavicular, and axillary nodes normal. Heart:regular rate and rhythm, S1, S2 normal, no murmur, click, rub or gallop Lung:chest clear, no wheezing, rales, normal symmetric air entry, Heart exam - S1, S2 normal, no murmur, no gallop, rate regular Abdomen: soft, non-tender, without masses or organomegaly EXT:no erythema, induration, or nodules   Lab Results: Lab Results  Component Value Date   WBC 12.0* 10/28/2011   HGB 17.1 10/28/2011   HCT 50.4* 10/28/2011   MCV 94.9 10/28/2011   PLT 300 10/28/2011     Chemistry      Component Value Date/Time   NA 138 12/11/2010 1029   NA 138 12/11/2010 1029   K 4.2 12/11/2010 1029   K  4.2 12/11/2010 1029   CL 100 12/11/2010 1029   CL 100 12/11/2010 1029   CO2 23 12/11/2010 1029   CO2 23 12/11/2010 1029   BUN 15 12/11/2010 1029   BUN 15 12/11/2010 1029   CREATININE 0.99 12/11/2010 1029   CREATININE 0.99 12/11/2010 1029      Component Value Date/Time   CALCIUM 9.2  12/11/2010 1029   CALCIUM 9.2 12/11/2010 1029   ALKPHOS 104 12/11/2010 1029   ALKPHOS 104 12/11/2010 1029   AST 22 12/11/2010 1029   AST 22 12/11/2010 1029   ALT 20 12/11/2010 1029   ALT 20 12/11/2010 1029   BILITOT 0.4 12/11/2010 1029   BILITOT 0.4 12/11/2010 1029       Radiological Studies: No results found.   Impression and Plan: This is a 61 year old gentleman with the following issues:  1. 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 He may also have obstructive sleep apnea.  With his oxygen use, it seems to have helped.  His hemoglobin is high normal.  He did have a JAK2 mutation that was negative, indicating less likely having a myeloproliferative disorder.  From that standpoint, really no further workup is necessary. No need for phlebotomy at this time. 2. Tobacco dependence. He continues to smoke 1.5 packs per day. He has been given a prescription for Chantix in the past by his pulmonologist. He did not take this. At this time, he is not ready to stop smoking. Will continue to offer ways for the patient to quit smoking. 3. Follow-up. Follow up is scheduled for 6 months.   The patient was seen and examined with Dr Clelia Croft. Spent more than half the time coordinating care.    Clenton Pare 3/27/20133:42 PM

## 2011-12-31 ENCOUNTER — Other Ambulatory Visit: Payer: Self-pay | Admitting: *Deleted

## 2011-12-31 DIAGNOSIS — J449 Chronic obstructive pulmonary disease, unspecified: Secondary | ICD-10-CM

## 2011-12-31 MED ORDER — ALBUTEROL SULFATE HFA 108 (90 BASE) MCG/ACT IN AERS: 2.0000 | INHALATION_SPRAY | RESPIRATORY_TRACT | Status: DC | PRN

## 2011-12-31 MED ORDER — TIOTROPIUM BROMIDE MONOHYDRATE 18 MCG IN CAPS: 18.0000 ug | ORAL_CAPSULE | Freq: Every day | RESPIRATORY_TRACT | Status: DC

## 2012-01-27 ENCOUNTER — Telehealth: Payer: Self-pay | Admitting: *Deleted

## 2012-01-27 NOTE — Telephone Encounter (Signed)
No note

## 2012-04-29 ENCOUNTER — Other Ambulatory Visit (HOSPITAL_BASED_OUTPATIENT_CLINIC_OR_DEPARTMENT_OTHER): Payer: Medicaid Other | Admitting: Lab

## 2012-04-29 ENCOUNTER — Ambulatory Visit (HOSPITAL_BASED_OUTPATIENT_CLINIC_OR_DEPARTMENT_OTHER): Payer: Medicaid Other | Admitting: Oncology

## 2012-04-29 ENCOUNTER — Encounter: Payer: Self-pay | Admitting: Oncology

## 2012-04-29 ENCOUNTER — Telehealth: Payer: Self-pay | Admitting: Oncology

## 2012-04-29 VITALS — BP 109/78 | HR 96 | Temp 97.0°F | Resp 20 | Ht 71.0 in | Wt 246.9 lb

## 2012-04-29 DIAGNOSIS — F17200 Nicotine dependence, unspecified, uncomplicated: Secondary | ICD-10-CM

## 2012-04-29 DIAGNOSIS — D751 Secondary polycythemia: Secondary | ICD-10-CM

## 2012-04-29 DIAGNOSIS — R0902 Hypoxemia: Secondary | ICD-10-CM

## 2012-04-29 LAB — CBC WITH DIFFERENTIAL/PLATELET
Basophils Absolute: 0.1 10*3/uL (ref 0.0–0.1)
Eosinophils Absolute: 0.7 10*3/uL — ABNORMAL HIGH (ref 0.0–0.5)
HCT: 50.2 % — ABNORMAL HIGH (ref 38.4–49.9)
LYMPH%: 17.6 % (ref 14.0–49.0)
MCV: 93.8 fL (ref 79.3–98.0)
MONO#: 1.3 10*3/uL — ABNORMAL HIGH (ref 0.1–0.9)
NEUT#: 10.5 10*3/uL — ABNORMAL HIGH (ref 1.5–6.5)
NEUT%: 68.5 % (ref 39.0–75.0)
Platelets: 306 10*3/uL (ref 140–400)
WBC: 15.3 10*3/uL — ABNORMAL HIGH (ref 4.0–10.3)

## 2012-04-29 NOTE — Telephone Encounter (Signed)
s.w. pt and advise on March appt....sed

## 2012-04-29 NOTE — Progress Notes (Signed)
Hematology and Oncology Follow Up Visit  Allen Green [id number] [birth year] 61 y.o. 05/01/2012 5:48 PM Henri Medal, MDMoreira, [name], MD   Principle Diagnosis: This is a 61 year old gentleman with polycythemia, most likely reactive, related to his lung disease.  Current Therapy: Observation  Interim History:  Allen Green is a 61 year-old gentleman who returns to the clinic today with his wife for routine follow-up. He has COPD and smokes 1 pack of cigarettes daily. Wears O2 all of the time @ 2L via nasal cannula. The O2 use has helped his symptomatology.  He does have some chronic knee pain. He has been scheduled to have surgery on his right knee, but this was cancelled twice due to upper respiratory symptoms. He otherwise has no problems today.    Medications: I have reviewed the patient's current medications. Current outpatient prescriptions:albuterol (PROVENTIL HFA) 108 (90 BASE) MCG/ACT inhaler, Inhale 2 puffs into the lungs every 4 (four) hours as needed for wheezing or shortness of breath., Disp: 1 Inhaler, Rfl: 5;  aspirin 81 MG tablet, Take 81 mg by mouth daily.  , Disp: , Rfl: ;  budesonide-formoterol (SYMBICORT) 160-4.5 MCG/ACT inhaler, Inhale 2 puffs into the lungs 2 (two) times daily., Disp: 1 Inhaler, Rfl: 11 celecoxib (CELEBREX) 100 MG capsule, Take 100 mg by mouth 2 (two) times daily as needed. , Disp: , Rfl: ;  metformin (FORTAMET) 500 MG (OSM) 24 hr tablet, Take 500 mg by mouth daily with breakfast.  , Disp: , Rfl: ;  tiotropium (SPIRIVA HANDIHALER) 18 MCG inhalation capsule, Place 1 capsule (18 mcg total) into inhaler and inhale daily., Disp: 30 capsule, Rfl: 6  Allergies: No Known Allergies  Past Medical History, Surgical history, Social history, and Family History were reviewed and updated.  Review of Systems: Constitutional:  Negative for fever, chills, night sweats, anorexia, weight loss, pain. Cardiovascular: no chest pain or dyspnea on exertion Respiratory: positive  for - cough Neurological: no TIA or stroke symptoms negative Dermatological: negative ENT: negative Skin: Negative. Gastrointestinal: no abdominal pain, change in bowel habits, or black or bloody stools Genito-Urinary: no dysuria, trouble voiding, or hematuria Hematological and Lymphatic: negative Breast: negative for breast lumps Musculoskeletal: positive for - joint pain Remaining ROS negative.  Physical Exam: Blood pressure 109/78, pulse 96, temperature 97 F (36.1 C), temperature source Oral, resp. rate 20, height 5\' 11"  (1.803 m), weight 246 lb 14.4 oz (111.993 kg). ECOG: 2 General appearance: alert, cooperative and no distress Head: Normocephalic, without obvious abnormality, atraumatic Neck: no adenopathy, no carotid bruit, no JVD, supple, symmetrical, trachea midline and thyroid not enlarged, symmetric, no tenderness/mass/nodules Lymph nodes: Cervical, supraclavicular, and axillary nodes normal. Heart:regular rate and rhythm, S1, S2 normal, no murmur, click, rub or gallop Lung:chest clear, no wheezing, rales, normal symmetric air entry, Heart exam - S1, S2 normal, no murmur, no gallop, rate regular Abdomen: soft, non-tender, without masses or organomegaly EXT:no erythema, induration, or nodules   Lab Results: Lab Results  Component Value Date   WBC 15.3* 04/29/2012   HGB 17.0 04/29/2012   HCT 50.2* 04/29/2012   MCV 93.8 04/29/2012   PLT 306 04/29/2012     Chemistry      Component Value Date/Time   NA 139 10/28/2011 1428   K 4.5 10/28/2011 1428   CL 104 10/28/2011 1428   CO2 25 10/28/2011 1428   BUN 18 10/28/2011 1428   CREATININE 0.91 10/28/2011 1428      Component Value Date/Time   CALCIUM 9.7 10/28/2011 1428  ALKPHOS 94 10/28/2011 1428   AST 17 10/28/2011 1428   ALT 16 10/28/2011 1428   BILITOT 0.3 10/28/2011 1428      Impression and Plan: This is a 61 year old gentleman with the following issues:  1. 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 He may also  have obstructive sleep apnea.  With his oxygen use, it seems to have helped.  His hemoglobin is high normal.  He did have a JAK2 mutation that was negative, indicating less likely having a myeloproliferative disorder.  From that standpoint, really no further workup is necessary. No need for phlebotomy at this time. 2. Tobacco dependence. He continues to smoke 1 pack of cigarettes per day. He has been given a prescription for Chantix in the past by his pulmonologist. He did not take this. At this time, he is not ready to stop smoking. Will continue to offer ways for the patient to quit smoking. 3. Follow-up. Follow up is scheduled for 6 months.   [place], [place] 9/29/20135:48 PM

## 2012-05-25 ENCOUNTER — Other Ambulatory Visit: Payer: Self-pay | Admitting: *Deleted

## 2012-05-25 DIAGNOSIS — J449 Chronic obstructive pulmonary disease, unspecified: Secondary | ICD-10-CM

## 2012-05-25 MED ORDER — BUDESONIDE-FORMOTEROL FUMARATE 160-4.5 MCG/ACT IN AERO: 2.0000 | INHALATION_SPRAY | Freq: Two times a day (BID) | RESPIRATORY_TRACT | Status: DC

## 2012-06-23 ENCOUNTER — Ambulatory Visit (INDEPENDENT_AMBULATORY_CARE_PROVIDER_SITE_OTHER): Payer: Medicare Other | Admitting: Emergency Medicine

## 2012-06-23 ENCOUNTER — Encounter: Payer: Self-pay | Admitting: Emergency Medicine

## 2012-06-23 VITALS — BP 138/70 | HR 106 | Temp 98.6°F | Ht 71.0 in | Wt 266.2 lb

## 2012-06-23 DIAGNOSIS — Z23 Encounter for immunization: Secondary | ICD-10-CM

## 2012-06-23 DIAGNOSIS — Z720 Tobacco use: Secondary | ICD-10-CM

## 2012-06-23 DIAGNOSIS — J449 Chronic obstructive pulmonary disease, unspecified: Secondary | ICD-10-CM

## 2012-06-23 DIAGNOSIS — F17200 Nicotine dependence, unspecified, uncomplicated: Secondary | ICD-10-CM

## 2012-06-23 NOTE — Progress Notes (Signed)
Subjective:    Patient ID: Allen Green, male    DOB: [birth year], 60 y.o.   MRN: [id number]  HPI 61 yo man with long tobacco hx, still smoking 2 pks a day. He has hx polycythemia, ? Etiology. Also OA with knee, shoulder and back pain. He is referred for severe exertional SOB. Has been progressive over several years. He has daily cough, some white phlegm. He hears wheezing  - often when exerting or when supine. He has had episodes of what sound like AE-COPD +/- pneumonia, never hospitalized but has had Prednisone and abx before. He has been on Spiriva before - he got it from a friend! Has also been rx with albuterol. He is interested in stopping smoking. He is being evaluated by Dr Pilar Grammes for possible re-do L knee surgery. He is planning to undergo cardiac stress testing under direction of Dr Wyatt Portela 01/07/11 -- follows up for tobacco, COPD, hypoxemia. He tells me that he has cut down cigs significantly - has smoked 1 pk total in 4 days. He is scheduled to get cardiac testing on 01/14/11 with Dr Diona Browner. Last time we started oxygen at 2L/min - he feels that it has helped his exertional tolerance. We started Spiriva x 10 days and he though it probably helped some, doesn't really miss it now that sample has run out. Doesn't have a SABA.   ROV 04/14/11 -- follows up for tobacco, COPD, hypoxemia. He tells me that he has been inconsistent with the Spiriva but that he does it most days. He can tell that it is helping him. He is wearing his oxygen to sleep, wears at home but doesn';t always wear it when he is out, but he knows he misses it. He still has significant exertional dyspnea. He is back up to a pack a day of cigarettes. He wants to quit, but doesn't have a plan.   ROV 06/22/11 -- 60 yom, follows up for tobacco, COPD, hypoxemia. Still smoking 1 pk/day. Pretty good about wearing his O2 set on 2L/min, but noticed some dry throat. He has had some URI sx last 2 weeks, more cough with white mucous. Last time  we added Symbicort to Spiriva - ? Whether the symbicort is causing throat irritation. He does feel that the breathing is better. He wants to quit smoking - we will make New Years Day our target.   ROV 07/24/11 -- COPD, CAD and hypoxemia, tobacco use.  He has been having more cough, more sputum - yellow/brown. No blood. Breathing is stable. Looking to have leg surgery at [facility]. Stable on Symbicort and Spiriva. O2 on 2L/min - 3L/min.   ROV 06/23/12 -- COPD, CAD and hypoxemia, heavy tobacco use. He quit tobacco 1 month ago and is using e-cig. Presents for annual f/u, but reports more cough, congestion, some blood-tinged last 3 days, ? Fever, muscle aches. He is using spiriva and symbicort.      Objective:   Physical Exam Filed Vitals:   06/23/12 1628  BP: 138/70  Pulse: 106  Temp: 98.6 F (37 C)   Gen: Pleasant, obese, in no distress,  normal affect  ENT: No lesions,  mouth clear,  oropharynx clear, no postnasal drip, coarse voice  Neck: No JVD, no TMG, no carotid bruits  Lungs: No use of accessory muscles, no dullness to percussion, no wheeze  Cardiovascular: RRR, heart sounds normal, no murmur or gallops, no peripheral edema  Musculoskeletal: No deformities, no cyanosis or clubbing  Neuro: alert,  non focal  Skin: Warm, no lesions or rashes   Assessment & Plan:  COPD (chronic obstructive pulmonary disease) With an apparent acute bronchitis, assoc with some mild hemoptysis - azithro - cxr now - if the bleeding doesn;t resolve then will need further w/u, probably CT scan and FOB.  - rov 1 month  Tobacco abuse - congratulated cessation

## 2012-06-23 NOTE — Patient Instructions (Addendum)
CONGRATULATIONS on stopping smoking!!! Continue your inhaled medications Take azithromycin as directed Follow with Dr Delton Coombes in 1 month

## 2012-06-23 NOTE — Assessment & Plan Note (Signed)
-   congratulated cessation

## 2012-06-23 NOTE — Assessment & Plan Note (Signed)
With an apparent acute bronchitis, assoc with some mild hemoptysis - azithro - cxr now - if the bleeding doesn;t resolve then will need further w/u, probably CT scan and FOB.  - rov 1 month

## 2012-06-27 ENCOUNTER — Telehealth: Payer: Self-pay | Admitting: Emergency Medicine

## 2012-06-27 DIAGNOSIS — J449 Chronic obstructive pulmonary disease, unspecified: Secondary | ICD-10-CM

## 2012-06-27 MED ORDER — AZITHROMYCIN 250 MG PO TABS: ORAL_TABLET | ORAL | Status: DC

## 2012-06-27 MED ORDER — PREDNISONE 10 MG PO TABS: ORAL_TABLET | ORAL | Status: DC

## 2012-06-27 NOTE — Telephone Encounter (Signed)
Pt aware. Nothing further was needed. RX's has been sent

## 2012-06-27 NOTE — Telephone Encounter (Signed)
COPD (chronic obstructive pulmonary disease) - BYRUM,ROBERT S., MD 06/23/2012 4:48 PM Signed  With an apparent acute bronchitis, assoc with some mild hemoptysis  - azithro  - cxr now  - if the bleeding doesn;t resolve then will need further w/u, probably CT scan and FOB.  - rov 1 month  ---------  I spoke with pt and he never had the CXR done and the azithro was never called in. He will come and have CXR done tomorrow. He is also requesting to have prednisone called in as well with the azithromycin. Please advise RB thanks

## 2012-06-27 NOTE — Telephone Encounter (Signed)
I don't know what happened - please call in azithro and pred taper - Take 40mg  daily for 3 days, then 30mg  daily for 3 days, then 20mg  daily for 3 days, then 10mg  daily for 3 days, then stop

## 2012-06-28 ENCOUNTER — Ambulatory Visit (INDEPENDENT_AMBULATORY_CARE_PROVIDER_SITE_OTHER)
Admission: RE | Admit: 2012-06-28 | Discharge: 2012-06-28 | Disposition: A | Discharge status: Home / Self Care | Payer: Medicaid Other | Source: Ambulatory Visit | Attending: Emergency Medicine | Admitting: Emergency Medicine

## 2012-06-28 DIAGNOSIS — J4489 Other specified chronic obstructive pulmonary disease: Secondary | ICD-10-CM

## 2012-06-28 DIAGNOSIS — J449 Chronic obstructive pulmonary disease, unspecified: Secondary | ICD-10-CM

## 2012-06-28 NOTE — Progress Notes (Signed)
Quick Note:  Spoke with patient, informed him of results as listed below per Dr. Byrum Patient verbalized understanding and nothing further needed at this time. ______ 

## 2012-07-28 ENCOUNTER — Ambulatory Visit: Payer: Medicaid Other | Admitting: Emergency Medicine

## 2012-08-10 ENCOUNTER — Telehealth: Payer: Self-pay | Admitting: Emergency Medicine

## 2012-08-10 NOTE — Telephone Encounter (Signed)
Spoke with Dr. Selena Batten-- Dr. Selena Batten will be doing patients Left Knee Revision 09/09/12. Patient is scheduled to be seen by Dr. Delton Coombes 08/18/12. Dr. Selena Batten is requesting the OV Note from that visit be faxed to her office once done @ [phone] -- Dr. Selena Batten is requesting Dr. Delton Coombes state something in the note clearing patient to have this surgery done.    Will forward to RB as FYI

## 2012-08-12 ENCOUNTER — Other Ambulatory Visit: Payer: Self-pay | Admitting: *Deleted

## 2012-08-12 DIAGNOSIS — J449 Chronic obstructive pulmonary disease, unspecified: Secondary | ICD-10-CM

## 2012-08-12 MED ORDER — BUDESONIDE-FORMOTEROL FUMARATE 160-4.5 MCG/ACT IN AERO: 2.0000 | INHALATION_SPRAY | Freq: Two times a day (BID) | RESPIRATORY_TRACT | Status: DC

## 2012-08-15 NOTE — Telephone Encounter (Signed)
Thank you :)

## 2012-08-18 ENCOUNTER — Ambulatory Visit (INDEPENDENT_AMBULATORY_CARE_PROVIDER_SITE_OTHER): Payer: Medicaid Other | Admitting: Emergency Medicine

## 2012-08-18 ENCOUNTER — Encounter: Payer: Self-pay | Admitting: Emergency Medicine

## 2012-08-18 VITALS — BP 100/70 | HR 85 | Temp 98.0°F | Ht 71.0 in | Wt 271.8 lb

## 2012-08-18 DIAGNOSIS — Z720 Tobacco use: Secondary | ICD-10-CM

## 2012-08-18 DIAGNOSIS — J449 Chronic obstructive pulmonary disease, unspecified: Secondary | ICD-10-CM

## 2012-08-18 DIAGNOSIS — F17200 Nicotine dependence, unspecified, uncomplicated: Secondary | ICD-10-CM

## 2012-08-18 NOTE — Assessment & Plan Note (Signed)
Has moderately severe COPD. I discussed with him the potential risks of a surgery including prolonged mechanical ventilation, death, pneumonia, etc. He understands these risks. There is nothing to preclude a surgery if the benefits outweigh these risks. He is likely goinmg to get spinal + conscious sedation which would be FAR superior to general anesthesia. I will send this info to Dr Elmyra Ricks office

## 2012-08-18 NOTE — Progress Notes (Signed)
Subjective:    Patient ID: Allen Green, male    DOB: [birth year], 62 y.o.   MRN: [id number]  HPI 62 yo man with long tobacco hx, still smoking 2 pks a day. He has hx polycythemia, ? Etiology. Also OA with knee, shoulder and back pain. He is referred for severe exertional SOB. Has been progressive over several years. He has daily cough, some white phlegm. He hears wheezing  - often when exerting or when supine. He has had episodes of what sound like AE-COPD +/- pneumonia, never hospitalized but has had Prednisone and abx before. He has been on Spiriva before - he got it from a friend! Has also been rx with albuterol. He is interested in stopping smoking. He is being evaluated by Dr Pilar Grammes for possible re-do L knee surgery. He is planning to undergo cardiac stress testing under direction of Dr Wyatt Portela 01/07/11 -- follows up for tobacco, COPD, hypoxemia. He tells me that he has cut down cigs significantly - has smoked 1 pk total in 4 days. He is scheduled to get cardiac testing on 01/14/11 with Dr Diona Browner. Last time we started oxygen at 2L/min - he feels that it has helped his exertional tolerance. We started Spiriva x 10 days and he though it probably helped some, doesn't really miss it now that sample has run out. Doesn't have a SABA.   ROV 04/14/11 -- follows up for tobacco, COPD, hypoxemia. He tells me that he has been inconsistent with the Spiriva but that he does it most days. He can tell that it is helping him. He is wearing his oxygen to sleep, wears at home but doesn';t always wear it when he is out, but he knows he misses it. He still has significant exertional dyspnea. He is back up to a pack a day of cigarettes. He wants to quit, but doesn't have a plan.   ROV 06/22/11 -- 60 yom, follows up for tobacco, COPD, hypoxemia. Still smoking 1 pk/day. Pretty good about wearing his O2 set on 2L/min, but noticed some dry throat. He has had some URI sx last 2 weeks, more cough with white mucous. Last time  we added Symbicort to Spiriva - ? Whether the symbicort is causing throat irritation. He does feel that the breathing is better. He wants to quit smoking - we will make New Years Day our target.   ROV 07/24/11 -- COPD, CAD and hypoxemia, tobacco use.  He has been having more cough, more sputum - yellow/brown. No blood. Breathing is stable. Looking to have leg surgery at [facility]. Stable on Symbicort and Spiriva. O2 on 2L/min - 3L/min.   ROV 06/23/12 -- COPD, CAD and hypoxemia, heavy tobacco use. He quit tobacco 1 month ago and is using e-cig. Presents for annual f/u, but reports more cough, congestion, some blood-tinged last 3 days, ? Fever, muscle aches. He is using spiriva and symbicort.    ROV 08/18/12 -- f/u for COPD, chronic bronchitis. He started the e-cig, allowed him to stop cigarettes x 5 weeks, then back to 2-3 a day; now no cigarettes x 1 week. He is on chronic O2. He is planning for L knee sgy, says that anesthesiologist can do it under spinal and conscious sedation.  No flares since last time. He is off prednisone. Still on Spiriva + Symbicort.    PULMONARY FUNCTON TEST 01/07/2011  FVC 4.39  FEV1 1.61  FEV1/FVC 36.7  FVC  % Predicted 92  FEV % Predicted 48  FeF 25-75 .39  FeF 25-75 % Predicted 3.2     Objective:   Physical Exam Filed Vitals:   08/18/12 1455  BP: 100/70  Pulse: 85  Temp: 98 F (36.7 C)   Gen: Pleasant, obese, in no distress,  normal affect  ENT: No lesions,  mouth clear,  oropharynx clear, no postnasal drip, coarse voice  Neck: No JVD, no TMG, no carotid bruits  Lungs: No use of accessory muscles, no dullness to percussion, no wheeze  Cardiovascular: RRR, heart sounds normal, no murmur or gallops, no peripheral edema  Musculoskeletal: No deformities, no cyanosis or clubbing  Neuro: alert, non focal  Skin: Warm, no lesions or rashes   Assessment & Plan:  Tobacco abuse None for a week. He is using the e-cig. Underscored that he CANNOT smoke  between now and the surgery on 09/09/12.   COPD (chronic obstructive pulmonary disease) Has moderately severe COPD. I discussed with him the potential risks of a surgery including prolonged mechanical ventilation, death, pneumonia, etc. He understands these risks. There is nothing to preclude a surgery if the benefits outweigh these risks. He is likely goinmg to get spinal + conscious sedation which would be FAR superior to general anesthesia. I will send this info to Dr Elmyra Ricks office

## 2012-08-18 NOTE — Assessment & Plan Note (Signed)
None for a week. He is using the e-cig. Underscored that he CANNOT smoke between now and the surgery on 09/09/12.

## 2012-08-18 NOTE — Patient Instructions (Addendum)
We discussed the risks of general anesthesia and a surgery today. There is nothing to preclude a surgery if the benefits outweigh these risks. You would do better to have spinal anesthesia and conscious sedation instead of general anesthesia.  Do NOT smoke between now and the surgery Continue your usual inhaled medications.  Follow with Dr Delton Coombes in 4 months or sooner if you have any problems.

## 2012-10-04 ENCOUNTER — Other Ambulatory Visit: Payer: Self-pay | Admitting: Emergency Medicine

## 2012-10-04 DIAGNOSIS — J449 Chronic obstructive pulmonary disease, unspecified: Secondary | ICD-10-CM

## 2012-10-04 MED ORDER — ALBUTEROL SULFATE HFA 108 (90 BASE) MCG/ACT IN AERS: 2.0000 | INHALATION_SPRAY | RESPIRATORY_TRACT | Status: DC | PRN

## 2012-10-04 MED ORDER — TIOTROPIUM BROMIDE MONOHYDRATE 18 MCG IN CAPS: 18.0000 ug | ORAL_CAPSULE | Freq: Every day | RESPIRATORY_TRACT | Status: DC

## 2012-10-04 NOTE — Telephone Encounter (Signed)
Medications filled.  

## 2012-10-27 ENCOUNTER — Other Ambulatory Visit (HOSPITAL_BASED_OUTPATIENT_CLINIC_OR_DEPARTMENT_OTHER): Payer: Medicare Other | Admitting: Lab

## 2012-10-27 ENCOUNTER — Ambulatory Visit (HOSPITAL_BASED_OUTPATIENT_CLINIC_OR_DEPARTMENT_OTHER): Payer: Medicare Other | Admitting: Oncology

## 2012-10-27 VITALS — BP 121/83 | HR 85 | Temp 97.1°F | Resp 22 | Ht 71.0 in | Wt 258.5 lb

## 2012-10-27 DIAGNOSIS — D751 Secondary polycythemia: Secondary | ICD-10-CM

## 2012-10-27 DIAGNOSIS — R0902 Hypoxemia: Secondary | ICD-10-CM

## 2012-10-27 DIAGNOSIS — F17200 Nicotine dependence, unspecified, uncomplicated: Secondary | ICD-10-CM

## 2012-10-27 LAB — CBC WITH DIFFERENTIAL/PLATELET
EOS%: 1.7 % (ref 0.0–7.0)
Eosinophils Absolute: 0.3 10*3/uL (ref 0.0–0.5)
LYMPH%: 24.4 % (ref 14.0–49.0)
MCH: 31 pg (ref 27.2–33.4)
MCV: 93.4 fL (ref 79.3–98.0)
MONO%: 6.2 % (ref 0.0–14.0)
NEUT#: 11.9 10*3/uL — ABNORMAL HIGH (ref 1.5–6.5)
Platelets: 297 10*3/uL (ref 140–400)
RBC: 5.56 10*6/uL (ref 4.20–5.82)
RDW: 14.4 % (ref 11.0–14.6)

## 2012-10-27 NOTE — Progress Notes (Signed)
Hematology and Oncology Follow Up Visit  Allen Green [id number] Jul 17, 1951 62 y.o. 10/27/2012 1:40 PM Allen Green, MDMoreira, [name], MD   Principle Diagnosis: This is a 62 year old gentleman with polycythemia, most likely reactive, related to his lung disease.  Current Therapy: Observation  Interim History:  [name] is a 62 year-old gentleman who returns to the clinic today with his wife for routine follow-up. He has COPD and smokes 1 pack of cigarettes daily. Wears O2 all of the time @ 2L via nasal cannula. The O2 use has helped his symptomatology.  He does have some chronic knee pain. He has been scheduled to have surgery on his right knee, but this was cancelled twice due to upper respiratory symptoms. He otherwise has no problems today.  No thrombosis or bleeding noted.   Medications: I have reviewed the patient's current medications. Current outpatient prescriptions:albuterol (PROVENTIL HFA) 108 (90 BASE) MCG/ACT inhaler, Inhale 2 puffs into the lungs every 4 (four) hours as needed for wheezing or shortness of breath., Disp: 1 Inhaler, Rfl: 5;  aspirin 81 MG tablet, Take 81 mg by mouth daily.  , Disp: , Rfl: ;  budesonide-formoterol (SYMBICORT) 160-4.5 MCG/ACT inhaler, Inhale 2 puffs into the lungs 2 (two) times daily., Disp: 1 Inhaler, Rfl: 5 tiotropium (SPIRIVA HANDIHALER) 18 MCG inhalation capsule, Place 1 capsule (18 mcg total) into inhaler and inhale daily., Disp: 30 capsule, Rfl: 6  Allergies: No Known Allergies  Past Medical History, Surgical history, Social history, and Family History were reviewed and updated.  Review of Systems: Constitutional:  Negative for fever, chills, night sweats, anorexia, weight loss, pain. Cardiovascular: no chest pain or dyspnea on exertion Respiratory: positive for - cough Neurological: no TIA or stroke symptoms negative Dermatological: negative ENT: negative Skin: Negative. Gastrointestinal: no abdominal pain, change in bowel habits,  or black or bloody stools Genito-Urinary: no dysuria, trouble voiding, or hematuria Hematological and Lymphatic: negative Breast: negative for breast lumps Musculoskeletal: positive for - joint pain Remaining ROS negative.  Physical Exam: Blood pressure 121/83, pulse 85, temperature 97.1 F (36.2 C), temperature source Oral, resp. rate 22, height 5\' 11"  (1.803 m), weight 258 lb 8 oz (117.255 kg), SpO2 96.00%. ECOG: 2 General appearance: alert, cooperative and no distress Head: Normocephalic, without obvious abnormality, atraumatic Neck: no adenopathy, no carotid bruit, no JVD, supple, symmetrical, trachea midline and thyroid not enlarged, symmetric, no tenderness/mass/nodules Lymph nodes: Cervical, supraclavicular, and axillary nodes normal. Heart:regular rate and rhythm, S1, S2 normal, no murmur, click, rub or gallop Lung:chest clear, no wheezing, rales, normal symmetric air entry, Heart exam - S1, S2 normal, no murmur, no gallop, rate regular Abdomen: soft, non-tender, without masses or organomegaly EXT:no erythema, induration, or nodules   Lab Results: Lab Results  Component Value Date   WBC 17.8* 10/27/2012   HGB 17.3* 10/27/2012   HCT 52.0* 10/27/2012   MCV 93.4 10/27/2012   PLT 297 10/27/2012     Chemistry      Component Value Date/Time   NA 139 10/28/2011 1428   K 4.5 10/28/2011 1428   CL 104 10/28/2011 1428   CO2 25 10/28/2011 1428   BUN 18 10/28/2011 1428   CREATININE 0.91 10/28/2011 1428      Component Value Date/Time   CALCIUM 9.7 10/28/2011 1428   ALKPHOS 94 10/28/2011 1428   AST 17 10/28/2011 1428   ALT 16 10/28/2011 1428   BILITOT 0.3 10/28/2011 1428      Impression and Plan: This is a 62 year old gentleman with  the following issues:  1. 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 He may also have obstructive sleep apnea.  With his oxygen use, it seems to have helped.  His hemoglobin is high normal.  He did have a JAK2 mutation that was negative, indicating  less likely having a myeloproliferative disorder.  From that standpoint, really no further workup is necessary. No need for phlebotomy at this time. 2. Tobacco dependence. He continues to smoke 1 pack of cigarettes per day. He has been given a prescription for Chantix in the past by his pulmonologist. He did not take this. At this time, he is not ready to stop smoking. Will continue to offer ways for the patient to quit smoking. 3. Follow-up. As needed.   Murlene Revell 3/27/20141:40 PM

## 2012-10-31 ENCOUNTER — Telehealth: Payer: Self-pay | Admitting: Dietician

## 2012-12-05 ENCOUNTER — Telehealth: Payer: Self-pay | Admitting: Emergency Medicine

## 2012-12-05 ENCOUNTER — Ambulatory Visit (INDEPENDENT_AMBULATORY_CARE_PROVIDER_SITE_OTHER): Payer: Medicare Other | Admitting: Adult Health

## 2012-12-05 ENCOUNTER — Encounter: Payer: Self-pay | Admitting: Adult Health

## 2012-12-05 VITALS — BP 122/82 | HR 111 | Temp 98.1°F | Ht 71.0 in | Wt 251.8 lb

## 2012-12-05 DIAGNOSIS — J449 Chronic obstructive pulmonary disease, unspecified: Secondary | ICD-10-CM

## 2012-12-05 MED ORDER — PREDNISONE 10 MG PO TABS: ORAL_TABLET | ORAL | Status: DC

## 2012-12-05 NOTE — Assessment & Plan Note (Signed)
Exacerbation in pt w/ ongoing smoking abuse   Plan  Prednisone taper over next week.  Mucinex DM Twice daily  As needed  Cough/congestion  Work on stopping smoking again- you can do this.  Please contact office for sooner follow up if symptoms do not improve or worsen or seek emergency care  Follow up with Dr. Delton Coombes  As planned next week and As needed

## 2012-12-05 NOTE — Telephone Encounter (Signed)
i spoke with pt and is scheduled to come in at 4:30 to see TP. Nothing further was needed

## 2012-12-05 NOTE — Patient Instructions (Addendum)
Prednisone taper over next week.  Mucinex DM Twice daily  As needed  Cough/congestion  Work on stopping smoking again- you can do this.  Please contact office for sooner follow up if symptoms do not improve or worsen or seek emergency care  Follow up with Dr. Delton Coombes  As planned next week and As needed

## 2012-12-05 NOTE — Progress Notes (Signed)
Subjective:    Patient ID: Allen Green, male    DOB: 1950-08-22, 62 y.o.   MRN: [id number]  HPI 62 yo man with long tobacco hx, still smoking 2 pks a day. He has hx polycythemia, ? Etiology. Also OA with knee, shoulder and back pain. He is referred for severe exertional SOB. Has been progressive over several years. He has daily cough, some white phlegm. He hears wheezing  - often when exerting or when supine. He has had episodes of what sound like AE-COPD +/- pneumonia, never hospitalized but has had Prednisone and abx before. He has been on Spiriva before - he got it from a friend! Has also been rx with albuterol. He is interested in stopping smoking. He is being evaluated by Dr Pilar Grammes for possible re-do L knee surgery. He is planning to undergo cardiac stress testing under direction of Dr Wyatt Portela 01/07/11 -- follows up for tobacco, COPD, hypoxemia. He tells me that he has cut down cigs significantly - has smoked 1 pk total in 4 days. He is scheduled to get cardiac testing on 01/14/11 with Dr Diona Browner. Last time we started oxygen at 2L/min - he feels that it has helped his exertional tolerance. We started Spiriva x 10 days and he though it probably helped some, doesn't really miss it now that sample has run out. Doesn't have a SABA.   ROV 04/14/11 -- follows up for tobacco, COPD, hypoxemia. He tells me that he has been inconsistent with the Spiriva but that he does it most days. He can tell that it is helping him. He is wearing his oxygen to sleep, wears at home but doesn';t always wear it when he is out, but he knows he misses it. He still has significant exertional dyspnea. He is back up to a pack a day of cigarettes. He wants to quit, but doesn't have a plan.   ROV 06/22/11 -- 60 yom, follows up for tobacco, COPD, hypoxemia. Still smoking 1 pk/day. Pretty good about wearing his O2 set on 2L/min, but noticed some dry throat. He has had some URI sx last 2 weeks, more cough with white mucous. Last time  we added Symbicort to Spiriva - ? Whether the symbicort is causing throat irritation. He does feel that the breathing is better. He wants to quit smoking - we will make New Years Day our target.   ROV 07/24/11 -- COPD, CAD and hypoxemia, tobacco use.  He has been having more cough, more sputum - yellow/brown. No blood. Breathing is stable. Looking to have leg surgery at [facility]. Stable on Symbicort and Spiriva. O2 on 2L/min - 3L/min.   ROV 06/23/12 -- COPD, CAD and hypoxemia, heavy tobacco use. He quit tobacco 1 month ago and is using e-cig. Presents for annual f/u, but reports more cough, congestion, some blood-tinged last 3 days, ? Fever, muscle aches. He is using spiriva and symbicort.    ROV 08/18/12 -- f/u for COPD, chronic bronchitis. He started the e-cig, allowed him to stop cigarettes x 5 weeks, then back to 2-3 a day; now no cigarettes x 1 week. He is on chronic O2. He is planning for L knee sgy, says that anesthesiologist can do it under spinal and conscious sedation.  No flares since last time. He is off prednisone. Still on Spiriva + Symbicort.    PULMONARY FUNCTON TEST 01/07/2011  FVC 4.39  FEV1 1.61  FEV1/FVC 36.7  FVC  % Predicted 92  FEV % Predicted 48  FeF 25-75 .39  FeF 25-75 % Predicted 3.2    12/05/2012 Acute OV  Complains of increased SOB, wheezing, chest tightness, prod cough with clear mucus x4-5days - denies f/c/s.  CAT = 27.  No fever, discolored mucus, edema orthopnea, chest pain, or n/v.  Has restarted smoking, discussed smoking cessation.  NO OTC used for symptoms No increased edema.  No recent ER or hospital visit.    ROS Neg except HPI    Objective:   Physical Exam Filed Vitals:   12/05/12 1703  BP: 122/82  Pulse: 111  Temp: 98.1 F (36.7 C)   Gen: Pleasant, obese, in no distress,  normal affect  ENT: No lesions,  mouth clear,  oropharynx clear, no postnasal drip, coarse voice  Neck: No JVD, no TMG, no carotid bruits  Lungs: No use of  accessory muscles, no dullness to percussion, no wheeze  Cardiovascular: RRR, heart sounds normal, no murmur or gallops, no peripheral edema HR recheck 90   Musculoskeletal: No deformities, no cyanosis or clubbing  Neuro: alert, non focal  Skin: Warm, no lesions or rashes   Assessment & Plan:  No problem-specific assessment & plan notes found for this encounter.

## 2012-12-08 MED ORDER — LEVALBUTEROL HCL 0.63 MG/3ML IN NEBU
0.6300 mg | INHALATION_SOLUTION | Freq: Once | RESPIRATORY_TRACT | Status: AC
  Administered 2012-12-08: 0.63 mg via RESPIRATORY_TRACT

## 2012-12-08 NOTE — Addendum Note (Signed)
Addended by: Boone Master E on: 12/08/2012 12:33 PM   Modules accepted: Orders

## 2012-12-16 ENCOUNTER — Ambulatory Visit (INDEPENDENT_AMBULATORY_CARE_PROVIDER_SITE_OTHER): Payer: Medicare Other | Admitting: Emergency Medicine

## 2012-12-16 ENCOUNTER — Encounter: Payer: Self-pay | Admitting: Emergency Medicine

## 2012-12-16 VITALS — BP 140/72 | HR 98 | Temp 97.6°F | Ht 69.75 in | Wt 247.2 lb

## 2012-12-16 DIAGNOSIS — J309 Allergic rhinitis, unspecified: Secondary | ICD-10-CM

## 2012-12-16 DIAGNOSIS — J449 Chronic obstructive pulmonary disease, unspecified: Secondary | ICD-10-CM

## 2012-12-16 MED ORDER — LORATADINE 10 MG PO TABS: 10.0000 mg | ORAL_TABLET | Freq: Every day | ORAL | Status: DC

## 2012-12-16 MED ORDER — PREDNISONE 10 MG PO TABS: ORAL_TABLET | ORAL | Status: DC

## 2012-12-16 MED ORDER — FLUTICASONE PROPIONATE 50 MCG/ACT NA SUSP: 2.0000 | Freq: Two times a day (BID) | NASAL | Status: DC

## 2012-12-16 NOTE — Patient Instructions (Addendum)
Please take prednisone as directed Continue your inhaled medications You need to stop smoking!! Start loratadine 10mg  daily  Start fluticasone nasal spray, 2 sprays each side twice a day Follow with Dr Delton Coombes in 3 months or sooner if you have any problems.

## 2012-12-16 NOTE — Assessment & Plan Note (Signed)
Suspect most of his sx are rhinitis, allergies.  - continue same BD's - pred taper - ov 3 mon

## 2012-12-16 NOTE — Progress Notes (Signed)
Subjective:    Patient ID: Allen Green, male    DOB: 1951-05-01, 62 y.o.   MRN: [id number]  HPI 62 yo man with long tobacco hx, still smoking 2 pks a day. He has hx polycythemia, ? Etiology. Also OA with knee, shoulder and back pain. He is referred for severe exertional SOB. Has been progressive over several years. He has daily cough, some white phlegm. He hears wheezing  - often when exerting or when supine. He has had episodes of what sound like AE-COPD +/- pneumonia, never hospitalized but has had Prednisone and abx before. He has been on Spiriva before - he got it from a friend! Has also been rx with albuterol. He is interested in stopping smoking. He is being evaluated by Dr Pilar Grammes for possible re-do L knee surgery. He is planning to undergo cardiac stress testing under direction of Dr Wyatt Portela 01/07/11 -- follows up for tobacco, COPD, hypoxemia. He tells me that he has cut down cigs significantly - has smoked 1 pk total in 4 days. He is scheduled to get cardiac testing on 01/14/11 with Dr Diona Browner. Last time we started oxygen at 2L/min - he feels that it has helped his exertional tolerance. We started Spiriva x 10 days and he though it probably helped some, doesn't really miss it now that sample has run out. Doesn't have a SABA.   ROV 04/14/11 -- follows up for tobacco, COPD, hypoxemia. He tells me that he has been inconsistent with the Spiriva but that he does it most days. He can tell that it is helping him. He is wearing his oxygen to sleep, wears at home but doesn';t always wear it when he is out, but he knows he misses it. He still has significant exertional dyspnea. He is back up to a pack a day of cigarettes. He wants to quit, but doesn't have a plan.   ROV 06/22/11 -- 60 yom, follows up for tobacco, COPD, hypoxemia. Still smoking 1 pk/day. Pretty good about wearing his O2 set on 2L/min, but noticed some dry throat. He has had some URI sx last 2 weeks, more cough with white mucous. Last time  we added Symbicort to Spiriva - ? Whether the symbicort is causing throat irritation. He does feel that the breathing is better. He wants to quit smoking - we will make New Years Day our target.   ROV 07/24/11 -- COPD, CAD and hypoxemia, tobacco use.  He has been having more cough, more sputum - yellow/brown. No blood. Breathing is stable. Looking to have leg surgery at [facility]. Stable on Symbicort and Spiriva. O2 on 2L/min - 3L/min.   ROV 06/23/12 -- COPD, CAD and hypoxemia, heavy tobacco use. He quit tobacco 1 month ago and is using e-cig. Presents for annual f/u, but reports more cough, congestion, some blood-tinged last 3 days, ? Fever, muscle aches. He is using spiriva and symbicort.    ROV 08/18/12 -- f/u for COPD, chronic bronchitis. He started the e-cig, allowed him to stop cigarettes x 5 weeks, then back to 2-3 a day; now no cigarettes x 1 week. He is on chronic O2. He is planning for L knee sgy, says that anesthesiologist can do it under spinal and conscious sedation.  No flares since last time. He is off prednisone. Still on Spiriva + Symbicort.    PULMONARY FUNCTON TEST 01/07/2011  FVC 4.39  FEV1 1.61  FEV1/FVC 36.7  FVC  % Predicted 92  FEV % Predicted 48  FeF 25-75 .39  FeF 25-75 % Predicted 3.2    12/05/12 -- Acute OV  Complains of increased SOB, wheezing, chest tightness, prod cough with clear mucus x4-5days - denies f/c/s.  CAT = 27.  No fever, discolored mucus, edema orthopnea, chest pain, or n/v.  Has restarted smoking, discussed smoking cessation.  NO OTC used for symptoms No increased edema.  No recent ER or hospital visit.   12/16/12 -- f/u for COPD, chronic bronchitis, smoker (1/2 pk a day). Currently on symbicort, spiriva, saba prn. He is having head congestion, clear mucous, cough productive clear. Using albuterol -3 x a day. He was unable to take the prior pred taper given by TP - he spilled coffee in the bottle     CAT Score 12/05/2012 08/18/2012  Total CAT  Score 27 21    ROS Neg except HPI    Objective:   Physical Exam Filed Vitals:   12/16/12 1500  BP: 140/72  Pulse: 98  Temp: 97.6 F (36.4 C)  TempSrc: Oral  Height: 5' 9.75" (1.772 m)  Weight: 247 lb 3.2 oz (112.129 kg)  SpO2: 93%   Gen: Pleasant, obese, in no distress,  normal affect  ENT: No lesions,  mouth clear,  oropharynx clear, no postnasal drip, coarse voice  Neck: No JVD, no TMG, no carotid bruits  Lungs: No use of accessory muscles, no dullness to percussion, no wheeze  Cardiovascular: RRR, heart sounds normal, no murmur or gallops, no peripheral edema  Musculoskeletal: No deformities, no cyanosis or clubbing  Neuro: alert, non focal  Skin: Warm, no lesions or rashes   Assessment & Plan:  Allergic rhinitis - start loratadine + fluticasone  COPD (chronic obstructive pulmonary disease) Suspect most of his sx are rhinitis, allergies.  - continue same BD's - pred taper - ov 3 mon

## 2012-12-16 NOTE — Assessment & Plan Note (Signed)
-   start loratadine + fluticasone

## 2012-12-16 NOTE — Addendum Note (Signed)
Addended by: Orma Flaming D on: 12/16/2012 04:38 PM   Modules accepted: Orders

## 2012-12-26 ENCOUNTER — Emergency Department (HOSPITAL_COMMUNITY)
Admission: EM | Admit: 2012-12-26 | Discharge: 2012-12-26 | Disposition: A | Discharge status: Home / Self Care | Payer: Medicare Other | Attending: Emergency Medicine | Admitting: Emergency Medicine

## 2012-12-26 ENCOUNTER — Encounter (HOSPITAL_COMMUNITY): Payer: Self-pay

## 2012-12-26 DIAGNOSIS — Z8701 Personal history of pneumonia (recurrent): Secondary | ICD-10-CM | POA: Insufficient documentation

## 2012-12-26 DIAGNOSIS — J449 Chronic obstructive pulmonary disease, unspecified: Secondary | ICD-10-CM | POA: Insufficient documentation

## 2012-12-26 DIAGNOSIS — M25519 Pain in unspecified shoulder: Secondary | ICD-10-CM | POA: Insufficient documentation

## 2012-12-26 DIAGNOSIS — M25569 Pain in unspecified knee: Secondary | ICD-10-CM | POA: Insufficient documentation

## 2012-12-26 DIAGNOSIS — IMO0002 Reserved for concepts with insufficient information to code with codable children: Secondary | ICD-10-CM | POA: Insufficient documentation

## 2012-12-26 DIAGNOSIS — G8929 Other chronic pain: Secondary | ICD-10-CM | POA: Insufficient documentation

## 2012-12-26 DIAGNOSIS — R05 Cough: Secondary | ICD-10-CM | POA: Insufficient documentation

## 2012-12-26 DIAGNOSIS — Z79899 Other long term (current) drug therapy: Secondary | ICD-10-CM | POA: Insufficient documentation

## 2012-12-26 DIAGNOSIS — J329 Chronic sinusitis, unspecified: Secondary | ICD-10-CM | POA: Insufficient documentation

## 2012-12-26 DIAGNOSIS — R51 Headache: Secondary | ICD-10-CM | POA: Insufficient documentation

## 2012-12-26 DIAGNOSIS — F17200 Nicotine dependence, unspecified, uncomplicated: Secondary | ICD-10-CM | POA: Insufficient documentation

## 2012-12-26 DIAGNOSIS — Z862 Personal history of diseases of the blood and blood-forming organs and certain disorders involving the immune mechanism: Secondary | ICD-10-CM | POA: Insufficient documentation

## 2012-12-26 DIAGNOSIS — J4489 Other specified chronic obstructive pulmonary disease: Secondary | ICD-10-CM | POA: Insufficient documentation

## 2012-12-26 DIAGNOSIS — R059 Cough, unspecified: Secondary | ICD-10-CM | POA: Insufficient documentation

## 2012-12-26 DIAGNOSIS — Z8719 Personal history of other diseases of the digestive system: Secondary | ICD-10-CM | POA: Insufficient documentation

## 2012-12-26 DIAGNOSIS — J3489 Other specified disorders of nose and nasal sinuses: Secondary | ICD-10-CM | POA: Insufficient documentation

## 2012-12-26 DIAGNOSIS — Z7982 Long term (current) use of aspirin: Secondary | ICD-10-CM | POA: Insufficient documentation

## 2012-12-26 MED ORDER — AZITHROMYCIN 250 MG PO TABS: ORAL_TABLET | ORAL | Status: DC

## 2012-12-26 MED ORDER — DEXAMETHASONE SODIUM PHOSPHATE 4 MG/ML IJ SOLN
10.0000 mg | Freq: Once | INTRAMUSCULAR | Status: AC
  Administered 2012-12-26: 10 mg via INTRAMUSCULAR
  Filled 2012-12-26: qty 3

## 2012-12-26 NOTE — ED Notes (Signed)
Pt reports being outside mowing about a week ago and came in with a headache.  Pt reports " i usually get a sinus infection around this time of year".  Pt denies any fever but does reports a productive cough.  Pt states "i always have a cough".

## 2012-12-26 NOTE — ED Notes (Signed)
nad noted prior to dc. Dc instructions reviewed and 1 script given to pt. Ambulated out without difficulty.
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
## 2012-12-29 NOTE — ED Provider Notes (Signed)
Medical screening examination/treatment/procedure(s) were performed by non-physician practitioner and as supervising physician I was immediately available for consultation/collaboration.   April Carlyon L Aubra Pappalardo, MD 12/29/12 1437 

## 2013-01-05 ENCOUNTER — Telehealth: Payer: Self-pay | Admitting: Emergency Medicine

## 2013-01-05 NOTE — Telephone Encounter (Signed)
I called and spoke with Allen Green. She is going to call the resp team and see what's going on and will give pt daughter a call. I called and made daughter aware. Nothing further was needed

## 2013-01-05 NOTE — Telephone Encounter (Signed)
I spoke with Allen Green. She stated they are going to do home sat testing on pt since at his last OV this was not done since he was switching DME's. She will bring order by tomorrow and have RB sign this. She states pt is aware of all this. Nothing further was needed

## 2013-01-12 ENCOUNTER — Other Ambulatory Visit: Payer: Self-pay | Admitting: Emergency Medicine

## 2013-01-12 DIAGNOSIS — J309 Allergic rhinitis, unspecified: Secondary | ICD-10-CM

## 2013-01-12 MED ORDER — FLUTICASONE PROPIONATE 50 MCG/ACT NA SUSP: 2.0000 | Freq: Two times a day (BID) | NASAL | Status: DC

## 2013-01-12 NOTE — Telephone Encounter (Signed)
90 supply requested via fax Rx has been sent

## 2013-01-20 ENCOUNTER — Telehealth: Payer: Self-pay | Admitting: Emergency Medicine

## 2013-01-20 NOTE — Telephone Encounter (Signed)
Melissa called back and will check on this

## 2013-01-20 NOTE — Telephone Encounter (Signed)
They did received order but it can take up to 2 weeks for 3rd party to go out to pt home.  I called pt and he stated Allen Green gave him up until the 16th of this month they would supply his O2. I scheduled pt to come in on Monday so we can get qualifying sats on pt and then fax over to [facility] so they can get him O2 tanks.   Melissa is aware of this as well.

## 2013-01-20 NOTE — Telephone Encounter (Signed)
I spoke with pt. He stated [facility] still has not brought any O2 tanks out to him. He has his concentrator he uses.  i called melissa and lmtcb x1 to see what is going on.

## 2013-01-23 ENCOUNTER — Ambulatory Visit (INDEPENDENT_AMBULATORY_CARE_PROVIDER_SITE_OTHER): Payer: Medicare Other

## 2013-01-23 DIAGNOSIS — J449 Chronic obstructive pulmonary disease, unspecified: Secondary | ICD-10-CM

## 2013-01-25 ENCOUNTER — Other Ambulatory Visit: Payer: Self-pay | Admitting: Emergency Medicine

## 2013-01-25 ENCOUNTER — Telehealth: Payer: Self-pay | Admitting: Emergency Medicine

## 2013-01-25 DIAGNOSIS — J309 Allergic rhinitis, unspecified: Secondary | ICD-10-CM

## 2013-01-25 DIAGNOSIS — J449 Chronic obstructive pulmonary disease, unspecified: Secondary | ICD-10-CM

## 2013-01-25 MED ORDER — FLUTICASONE PROPIONATE 50 MCG/ACT NA SUSP: 2.0000 | Freq: Two times a day (BID) | NASAL | Status: DC

## 2013-01-25 MED ORDER — BUDESONIDE-FORMOTEROL FUMARATE 160-4.5 MCG/ACT IN AERO: 2.0000 | INHALATION_SPRAY | Freq: Two times a day (BID) | RESPIRATORY_TRACT | Status: DC

## 2013-01-25 NOTE — Telephone Encounter (Signed)
Per Melissa need to print and refax the o2 order with today's date and RB's sig This was done and faxed back  Nothing further needed

## 2013-01-25 NOTE — Addendum Note (Signed)
Addended by: Orma Flaming D on: 01/25/2013 11:49 AM   Modules accepted: Orders

## 2013-03-07 NOTE — Addendum Note (Signed)
Addended by: Reynaldo Minium C on: 03/07/2013 11:54 AM   Modules accepted: Level of Service

## 2013-03-23 ENCOUNTER — Ambulatory Visit (INDEPENDENT_AMBULATORY_CARE_PROVIDER_SITE_OTHER): Payer: Medicare Other | Admitting: Emergency Medicine

## 2013-03-23 ENCOUNTER — Telehealth: Payer: Self-pay | Admitting: Emergency Medicine

## 2013-03-23 ENCOUNTER — Encounter: Payer: Self-pay | Admitting: Emergency Medicine

## 2013-03-23 VITALS — BP 128/78 | HR 122 | Temp 98.1°F | Ht 71.0 in | Wt 263.8 lb

## 2013-03-23 DIAGNOSIS — J4489 Other specified chronic obstructive pulmonary disease: Secondary | ICD-10-CM

## 2013-03-23 DIAGNOSIS — J449 Chronic obstructive pulmonary disease, unspecified: Secondary | ICD-10-CM

## 2013-03-23 DIAGNOSIS — J309 Allergic rhinitis, unspecified: Secondary | ICD-10-CM

## 2013-03-23 MED ORDER — BENZONATATE 200 MG PO CAPS: 200.0000 mg | ORAL_CAPSULE | Freq: Three times a day (TID) | ORAL | Status: DC | PRN

## 2013-03-23 MED ORDER — PREDNISONE 10 MG PO TABS: ORAL_TABLET | ORAL | Status: DC

## 2013-03-23 MED ORDER — AZITHROMYCIN 250 MG PO TABS: ORAL_TABLET | ORAL | Status: DC

## 2013-03-23 NOTE — Assessment & Plan Note (Addendum)
Continue fluticasone and loratadine 

## 2013-03-23 NOTE — Patient Instructions (Addendum)
Please continue your Spiriva and Symbicort Take prednisone and azithromycin as directed Use tessalon perles as needed for coughing Follow with Dr Delton Coombes or Andrey Campanile, NP, in 2 -3 weeks

## 2013-03-23 NOTE — Telephone Encounter (Signed)
RX fixed and sent 

## 2013-03-23 NOTE — Assessment & Plan Note (Signed)
Acute exacerbation in setting URI - spiriva and symbicort - pred taper - azithro - rov 2-3 weeks.  - discussed smoking cessation

## 2013-03-23 NOTE — Progress Notes (Signed)
Subjective:    Patient ID: Allen Green, male    DOB: 06/28/1951, 62 y.o.   MRN: [id number]  HPI 62 yo man with long tobacco hx, still smoking 2 pks a day. He has hx polycythemia, ? Etiology. Also OA with knee, shoulder and back pain. He is referred for severe exertional SOB. Has been progressive over several years. He has daily cough, some white phlegm. He hears wheezing  - often when exerting or when supine. He has had episodes of what sound like AE-COPD +/- pneumonia, never hospitalized but has had Prednisone and abx before. He has been on Spiriva before - he got it from a friend! Has also been rx with albuterol. He is interested in stopping smoking. He is being evaluated by Dr Pilar Grammes for possible re-do L knee surgery. He is planning to undergo cardiac stress testing under direction of Dr Wyatt Portela 01/07/11 -- follows up for tobacco, COPD, hypoxemia. He tells me that he has cut down cigs significantly - has smoked 1 pk total in 4 days. He is scheduled to get cardiac testing on 01/14/11 with Dr Diona Browner. Last time we started oxygen at 2L/min - he feels that it has helped his exertional tolerance. We started Spiriva x 10 days and he though it probably helped some, doesn't really miss it now that sample has run out. Doesn't have a SABA.   ROV 04/14/11 -- follows up for tobacco, COPD, hypoxemia. He tells me that he has been inconsistent with the Spiriva but that he does it most days. He can tell that it is helping him. He is wearing his oxygen to sleep, wears at home but doesn';t always wear it when he is out, but he knows he misses it. He still has significant exertional dyspnea. He is back up to a pack a day of cigarettes. He wants to quit, but doesn't have a plan.   ROV 06/22/11 -- 60 yom, follows up for tobacco, COPD, hypoxemia. Still smoking 1 pk/day. Pretty good about wearing his O2 set on 2L/min, but noticed some dry throat. He has had some URI sx last 2 weeks, more cough with white mucous. Last time  we added Symbicort to Spiriva - ? Whether the symbicort is causing throat irritation. He does feel that the breathing is better. He wants to quit smoking - we will make New Years Day our target.   ROV 07/24/11 -- COPD, CAD and hypoxemia, tobacco use.  He has been having more cough, more sputum - yellow/brown. No blood. Breathing is stable. Looking to have leg surgery at [facility]. Stable on Symbicort and Spiriva. O2 on 2L/min - 3L/min.   ROV 06/23/12 -- COPD, CAD and hypoxemia, heavy tobacco use. He quit tobacco 1 month ago and is using e-cig. Presents for annual f/u, but reports more cough, congestion, some blood-tinged last 3 days, ? Fever, muscle aches. He is using spiriva and symbicort.    ROV 08/18/12 -- f/u for COPD, chronic bronchitis. He started the e-cig, allowed him to stop cigarettes x 5 weeks, then back to 2-3 a day; now no cigarettes x 1 week. He is on chronic O2. He is planning for L knee sgy, says that anesthesiologist can do it under spinal and conscious sedation.  No flares since last time. He is off prednisone. Still on Spiriva + Symbicort.    PULMONARY FUNCTON TEST 01/07/2011  FVC 4.39  FEV1 1.61  FEV1/FVC 36.7  FVC  % Predicted 92  FEV % Predicted 48  FeF 25-75 .39  FeF 25-75 % Predicted 3.2    12/05/12 -- Acute OV  Complains of increased SOB, wheezing, chest tightness, prod cough with clear mucus x4-5days - denies f/c/s.  CAT = 27.  No fever, discolored mucus, edema orthopnea, chest pain, or n/v.  Has restarted smoking, discussed smoking cessation.  NO OTC used for symptoms No increased edema.  No recent ER or hospital visit.   12/16/12 -- f/u for COPD, chronic bronchitis, smoker (1/2 pk a day). Currently on symbicort, spiriva, saba prn. He is having head congestion, clear mucous, cough productive clear. Using albuterol -3 x a day. He was unable to take the prior pred taper given by TP - he spilled coffee in the bottle  03/23/13 -- f/u for COPD, chronic bronchitis,  smoker (has cut down to 3 cig a day, using the vaporizer). For the last week has had more nasal congestion, more chest congestion, cough productive of white mucous. No fever, HA. Has been stuck in the bed all week.      CAT Score 12/05/2012 08/18/2012  Total CAT Score 27 21   ROS Neg except HPI    Objective:   Physical Exam Filed Vitals:   03/23/13 1423  BP: 128/78  Pulse: 122  Temp: 98.1 F (36.7 C)  TempSrc: Oral  Height: 5\' 11"  (1.803 m)  Weight: 263 lb 12.8 oz (119.659 kg)  SpO2: 91%   Gen: Pleasant, obese, in no distress,  normal affect  ENT: No lesions,  mouth clear,  oropharynx clear, no postnasal drip, coarse voice  Neck: No JVD, no TMG, no carotid bruits  Lungs: No use of accessory muscles, no dullness to percussion, B wheezes esp at bases.   Cardiovascular: RRR, heart sounds normal, no murmur or gallops, no peripheral edema  Musculoskeletal: No deformities, no cyanosis or clubbing  Neuro: alert, non focal  Skin: Warm, no lesions or rashes   Assessment & Plan:  Allergic rhinitis Continue fluticasone and loratadine.   COPD (chronic obstructive pulmonary disease) Acute exacerbation in setting URI - spiriva and symbicort - pred taper - azithro - rov 2-3 weeks.  - discussed smoking cessation

## 2013-03-29 ENCOUNTER — Encounter: Payer: Self-pay | Admitting: Emergency Medicine

## 2013-03-29 ENCOUNTER — Ambulatory Visit (INDEPENDENT_AMBULATORY_CARE_PROVIDER_SITE_OTHER): Payer: Medicare Other | Admitting: Emergency Medicine

## 2013-03-29 VITALS — BP 116/74 | HR 97 | Temp 98.9°F | Ht 71.0 in | Wt 266.2 lb

## 2013-03-29 DIAGNOSIS — J449 Chronic obstructive pulmonary disease, unspecified: Secondary | ICD-10-CM

## 2013-03-29 NOTE — Assessment & Plan Note (Signed)
Continues to have cough, congestion. Slow to resolve URI. Agree that his smoking is huge barrier now - he says that he is quitting today.  - finish prednisone - continue spiriva and symbicort, will likely need to change symbicort to an alternative due to insurance - rov next week

## 2013-03-29 NOTE — Patient Instructions (Signed)
Please continue your Spiriva and symbicort. We will find a substitute for symbicort at your next visit.  Finish your prednisone Congratulations on stopping smoking Follow with Dr Delton Coombes as planned.

## 2013-03-29 NOTE — Progress Notes (Signed)
Subjective:    Patient ID: Allen Green, male    DOB: [birth year], 63 y.o.   MRN: [id number]  HPI 62 yo man with long tobacco hx, still smoking 2 pks a day. He has hx polycythemia, ? Etiology. Also OA with knee, shoulder and back pain. He is referred for severe exertional SOB. Has been progressive over several years. He has daily cough, some white phlegm. He hears wheezing  - often when exerting or when supine. He has had episodes of what sound like AE-COPD +/- pneumonia, never hospitalized but has had Prednisone and abx before. He has been on Spiriva before - he got it from a friend! Has also been rx with albuterol. He is interested in stopping smoking. He is being evaluated by Dr Pilar Grammes for possible re-do L knee surgery. He is planning to undergo cardiac stress testing under direction of Dr Wyatt Portela 01/07/11 -- follows up for tobacco, COPD, hypoxemia. He tells me that he has cut down cigs significantly - has smoked 1 pk total in 4 days. He is scheduled to get cardiac testing on 01/14/11 with Dr Diona Browner. Last time we started oxygen at 2L/min - he feels that it has helped his exertional tolerance. We started Spiriva x 10 days and he though it probably helped some, doesn't really miss it now that sample has run out. Doesn't have a SABA.   ROV 04/14/11 -- follows up for tobacco, COPD, hypoxemia. He tells me that he has been inconsistent with the Spiriva but that he does it most days. He can tell that it is helping him. He is wearing his oxygen to sleep, wears at home but doesn';t always wear it when he is out, but he knows he misses it. He still has significant exertional dyspnea. He is back up to a pack a day of cigarettes. He wants to quit, but doesn't have a plan.   ROV 06/22/11 -- 60 yom, follows up for tobacco, COPD, hypoxemia. Still smoking 1 pk/day. Pretty good about wearing his O2 set on 2L/min, but noticed some dry throat. He has had some URI sx last 2 weeks, more cough with white mucous. Last time  we added Symbicort to Spiriva - ? Whether the symbicort is causing throat irritation. He does feel that the breathing is better. He wants to quit smoking - we will make New Years Day our target.   ROV 07/24/11 -- COPD, CAD and hypoxemia, tobacco use.  He has been having more cough, more sputum - yellow/brown. No blood. Breathing is stable. Looking to have leg surgery at [facility]. Stable on Symbicort and Spiriva. O2 on 2L/min - 3L/min.   ROV 06/23/12 -- COPD, CAD and hypoxemia, heavy tobacco use. He quit tobacco 1 month ago and is using e-cig. Presents for annual f/u, but reports more cough, congestion, some blood-tinged last 3 days, ? Fever, muscle aches. He is using spiriva and symbicort.    ROV 08/18/12 -- f/u for COPD, chronic bronchitis. He started the e-cig, allowed him to stop cigarettes x 5 weeks, then back to 2-3 a day; now no cigarettes x 1 week. He is on chronic O2. He is planning for L knee sgy, says that anesthesiologist can do it under spinal and conscious sedation.  No flares since last time. He is off prednisone. Still on Spiriva + Symbicort.    PULMONARY FUNCTON TEST 01/07/2011  FVC 4.39  FEV1 1.61  FEV1/FVC 36.7  FVC  % Predicted 92  FEV % Predicted 48  FeF 25-75 .39  FeF 25-75 % Predicted 3.2    12/05/12 -- Acute OV  Complains of increased SOB, wheezing, chest tightness, prod cough with clear mucus x4-5days - denies f/c/s.  CAT = 27.  No fever, discolored mucus, edema orthopnea, chest pain, or n/v.  Has restarted smoking, discussed smoking cessation.  NO OTC used for symptoms No increased edema.  No recent ER or hospital visit.   12/16/12 -- f/u for COPD, chronic bronchitis, smoker (1/2 pk a day). Currently on symbicort, spiriva, saba prn. He is having head congestion, clear mucous, cough productive clear. Using albuterol -3 x a day. He was unable to take the prior pred taper given by TP - he spilled coffee in the bottle  03/23/13 -- f/u for COPD, chronic bronchitis,  smoker (has cut down to 3 cig a day, using the vaporizer). For the last week has had more nasal congestion, more chest congestion, cough productive of white mucous. No fever, HA. Has been stuck in the bed all week.   ROV acute 03/29/13 -- f/u for COPD, chronic bronchitis, smoker. Treated with pred taper + azithro a week ago for AE in setting URI. He had a CXR at PCP >> COPD, no infiltrates. He has continued to have nasal congestion, cough. Turns out he wasn't smoking 3 cig a day, he admits now that he was smoking 3 packs a day. His Symbicort is no longer to be covered by his insurance. Will need a substitute. CXR reviewed from 8/20 > showed hyperinflation, chronic bronchitis changes, mild interstitial prominence.     CAT Score 12/05/2012 08/18/2012  Total CAT Score 27 21   ROS Neg except HPI    Objective:   Physical Exam Filed Vitals:   03/29/13 1642  BP: 116/74  Pulse: 97  Temp: 98.9 F (37.2 C)  TempSrc: Oral  Height: 5\' 11"  (1.803 m)  Weight: 266 lb 3.2 oz (120.748 kg)  SpO2: 92%   Gen: Pleasant, obese, in no distress,  normal affect  ENT: No lesions,  mouth clear,  oropharynx clear, no postnasal drip, coarse voice  Neck: No JVD, no TMG, no carotid bruits  Lungs: No use of accessory muscles, B end exp wheezes esp at bases.   Cardiovascular: RRR, heart sounds normal, no murmur or gallops, no peripheral edema  Musculoskeletal: No deformities, no cyanosis or clubbing  Neuro: alert, non focal  Skin: Warm, no lesions or rashes   Assessment & Plan:  No problem-specific assessment & plan notes found for this encounter.

## 2013-04-07 ENCOUNTER — Ambulatory Visit: Payer: Medicare Other | Admitting: Adult Health

## 2013-04-11 ENCOUNTER — Ambulatory Visit (INDEPENDENT_AMBULATORY_CARE_PROVIDER_SITE_OTHER): Payer: Medicare Other | Admitting: Adult Health

## 2013-04-11 ENCOUNTER — Encounter: Payer: Self-pay | Admitting: Adult Health

## 2013-04-11 VITALS — BP 108/74 | HR 94 | Ht 71.0 in | Wt 267.2 lb

## 2013-04-11 DIAGNOSIS — J449 Chronic obstructive pulmonary disease, unspecified: Secondary | ICD-10-CM

## 2013-04-11 MED ORDER — MOMETASONE FURO-FORMOTEROL FUM 200-5 MCG/ACT IN AERO: 2.0000 | INHALATION_SPRAY | Freq: Two times a day (BID) | RESPIRATORY_TRACT | Status: DC

## 2013-04-11 NOTE — Assessment & Plan Note (Signed)
Compensated on present regimen  Change to [employer] formulary coverage.   Plan  change Symbicort to Dulera 2 puffs Twice daily   Continue on Spiriva 1 puff daily  Flu shot today .  Great job not smoking .  follow up Dr. Delton Coombes  In 3 months and As needed

## 2013-04-11 NOTE — Patient Instructions (Addendum)
We will change Symbicort to Dulera 2 puffs Twice daily   Continue on Spiriva 1 puff daily  Flu shot today .  Great job not smoking .  follow up Dr. Delton Coombes  In 3 months and As needed
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
## 2013-07-04 ENCOUNTER — Other Ambulatory Visit: Payer: Self-pay | Admitting: *Deleted

## 2013-07-04 DIAGNOSIS — J449 Chronic obstructive pulmonary disease, unspecified: Secondary | ICD-10-CM

## 2013-07-04 MED ORDER — TIOTROPIUM BROMIDE MONOHYDRATE 18 MCG IN CAPS: 18.0000 ug | ORAL_CAPSULE | Freq: Every day | RESPIRATORY_TRACT | Status: DC

## 2013-07-07 ENCOUNTER — Telehealth: Payer: Self-pay | Admitting: Emergency Medicine

## 2013-07-07 ENCOUNTER — Ambulatory Visit (INDEPENDENT_AMBULATORY_CARE_PROVIDER_SITE_OTHER): Payer: Medicare Other | Admitting: Pulmonary Disease

## 2013-07-07 ENCOUNTER — Encounter: Payer: Self-pay | Admitting: Pulmonary Disease

## 2013-07-07 VITALS — BP 126/74 | HR 86 | Temp 97.6°F | Ht 71.0 in | Wt 276.0 lb

## 2013-07-07 DIAGNOSIS — J441 Chronic obstructive pulmonary disease with (acute) exacerbation: Secondary | ICD-10-CM

## 2013-07-07 DIAGNOSIS — J449 Chronic obstructive pulmonary disease, unspecified: Secondary | ICD-10-CM

## 2013-07-07 MED ORDER — CEFDINIR 300 MG PO CAPS: ORAL_CAPSULE | ORAL | Status: DC

## 2013-07-07 MED ORDER — PREDNISONE 10 MG PO TABS: ORAL_TABLET | ORAL | Status: DC

## 2013-07-07 NOTE — Telephone Encounter (Signed)
I called pt and he is scheduled to come in and see KC this afternoon. Nothing further needed

## 2013-07-07 NOTE — Assessment & Plan Note (Signed)
The patient is clearly having an acute COPD exacerbation, probably secondary to his ongoing smoking with airway inflammation. He will need a course of antibiotics for possible infection, as well as prednisone taper. I have also counseled him on the importance of total smoking cessation.

## 2013-07-07 NOTE — Patient Instructions (Signed)
omnicef 300mg , take 2 each am for 5 days Prednisone taper over 8 days Stop smoking.  This is the key to doing well.  followup with Dr. Delton Coombes as scheduled, but call if you are not improving.

## 2013-07-07 NOTE — Addendum Note (Signed)
Addended by: Gweneth Dimitri D on: 07/07/2013 02:47 PM   Modules accepted: Orders

## 2013-07-07 NOTE — Progress Notes (Signed)
   Subjective:    Patient ID: Allen Green, male    DOB: Nov 16, 1950, 62 y.o.   MRN: [id number]  HPI Patient comes in today for an acute sick visit. He has known COPD with chronic respiratory failure, and unfortunately restarted smoking. He gives a five-day history of increasing chest congestion, cough with discolored mucus, as well as increased shortness of breath and wheezing. He has been staying on his maintenance regimen compliantly.   Review of Systems  Constitutional: Negative for fever and unexpected weight change.  HENT: Positive for congestion, postnasal drip and sneezing. Negative for dental problem, ear pain, nosebleeds, rhinorrhea, sinus pressure, sore throat and trouble swallowing.   Eyes: Negative for redness and itching.  Respiratory: Positive for cough, chest tightness, shortness of breath and wheezing.   Cardiovascular: Positive for palpitations. Negative for leg swelling.  Gastrointestinal: Negative for nausea and vomiting.  Genitourinary: Negative for dysuria.  Musculoskeletal: Negative for joint swelling.  Skin: Negative for rash.  Neurological: Negative for headaches.  Hematological: Bruises/bleeds easily.  Psychiatric/Behavioral: Negative for dysphoric mood. The patient is not nervous/anxious.        Objective:   Physical Exam Obese male in no acute distress Nose without purulence or discharge noted Neck without lymphadenopathy or thyromegaly Chest with diffuse rhonchi and a few wheezes, decreased airflow, mild basilar crackles. Cardiac exam with distant heart sounds but regular Lower extremities without edema, but arterial insufficiency changes. Neurologically he is alert and oriented, moves all 4 extremities.       Assessment & Plan:

## 2013-07-14 ENCOUNTER — Ambulatory Visit: Payer: Medicare Other | Admitting: Emergency Medicine

## 2013-07-20 ENCOUNTER — Ambulatory Visit: Payer: Medicare Other | Admitting: Emergency Medicine

## 2013-08-01 ENCOUNTER — Other Ambulatory Visit: Payer: Self-pay | Admitting: *Deleted

## 2013-08-01 DIAGNOSIS — J449 Chronic obstructive pulmonary disease, unspecified: Secondary | ICD-10-CM

## 2013-08-01 MED ORDER — ALBUTEROL SULFATE HFA 108 (90 BASE) MCG/ACT IN AERS: 2.0000 | INHALATION_SPRAY | RESPIRATORY_TRACT | Status: DC | PRN

## 2013-08-01 MED ORDER — TIOTROPIUM BROMIDE MONOHYDRATE 18 MCG IN CAPS: 18.0000 ug | ORAL_CAPSULE | Freq: Every day | RESPIRATORY_TRACT | Status: DC

## 2013-08-06 ENCOUNTER — Encounter (HOSPITAL_COMMUNITY): Payer: Self-pay | Admitting: Emergency Medicine

## 2013-08-06 ENCOUNTER — Emergency Department (HOSPITAL_COMMUNITY)
Admission: EM | Admit: 2013-08-06 | Discharge: 2013-08-06 | Disposition: A | Discharge status: Home / Self Care | Payer: Medicare Other | Source: Home / Self Care | Attending: Emergency Medicine | Admitting: Emergency Medicine

## 2013-08-06 ENCOUNTER — Emergency Department (HOSPITAL_COMMUNITY): Payer: Medicare Other

## 2013-08-06 DIAGNOSIS — J45909 Unspecified asthma, uncomplicated: Secondary | ICD-10-CM

## 2013-08-06 DIAGNOSIS — Z862 Personal history of diseases of the blood and blood-forming organs and certain disorders involving the immune mechanism: Secondary | ICD-10-CM

## 2013-08-06 DIAGNOSIS — G8929 Other chronic pain: Secondary | ICD-10-CM

## 2013-08-06 DIAGNOSIS — IMO0002 Reserved for concepts with insufficient information to code with codable children: Secondary | ICD-10-CM

## 2013-08-06 DIAGNOSIS — F17200 Nicotine dependence, unspecified, uncomplicated: Secondary | ICD-10-CM | POA: Insufficient documentation

## 2013-08-06 DIAGNOSIS — Z8701 Personal history of pneumonia (recurrent): Secondary | ICD-10-CM

## 2013-08-06 DIAGNOSIS — J45901 Unspecified asthma with (acute) exacerbation: Secondary | ICD-10-CM

## 2013-08-06 DIAGNOSIS — Z79899 Other long term (current) drug therapy: Secondary | ICD-10-CM | POA: Insufficient documentation

## 2013-08-06 DIAGNOSIS — J441 Chronic obstructive pulmonary disease with (acute) exacerbation: Secondary | ICD-10-CM

## 2013-08-06 DIAGNOSIS — Z8719 Personal history of other diseases of the digestive system: Secondary | ICD-10-CM | POA: Insufficient documentation

## 2013-08-06 DIAGNOSIS — R52 Pain, unspecified: Secondary | ICD-10-CM

## 2013-08-06 LAB — CBC WITH DIFFERENTIAL/PLATELET
Basophils Absolute: 0.1 10*3/uL (ref 0.0–0.1)
Basophils Relative: 1 % (ref 0–1)
Eosinophils Absolute: 0.2 10*3/uL (ref 0.0–0.7)
Eosinophils Relative: 2 % (ref 0–5)
HCT: 48.9 % (ref 39.0–52.0)
HEMOGLOBIN: 16.9 g/dL (ref 13.0–17.0)
Lymphocytes Relative: 8 % — ABNORMAL LOW (ref 12–46)
Lymphs Abs: 1 10*3/uL (ref 0.7–4.0)
MCH: 32.9 pg (ref 26.0–34.0)
MCHC: 34.6 g/dL (ref 30.0–36.0)
MCV: 95.1 fL (ref 78.0–100.0)
Monocytes Absolute: 1.3 10*3/uL — ABNORMAL HIGH (ref 0.1–1.0)
Monocytes Relative: 11 % (ref 3–12)
NEUTROS ABS: 9.7 10*3/uL — AB (ref 1.7–7.7)
Neutrophils Relative %: 79 % — ABNORMAL HIGH (ref 43–77)
Platelets: 216 10*3/uL (ref 150–400)
RBC: 5.14 MIL/uL (ref 4.22–5.81)
RDW: 14.3 % (ref 11.5–15.5)
WBC: 12.3 10*3/uL — ABNORMAL HIGH (ref 4.0–10.5)

## 2013-08-06 LAB — COMPREHENSIVE METABOLIC PANEL
ALK PHOS: 73 U/L (ref 39–117)
ALT: 43 U/L (ref 0–53)
AST: 24 U/L (ref 0–37)
Albumin: 3.7 g/dL (ref 3.5–5.2)
BUN: 18 mg/dL (ref 6–23)
CO2: 28 meq/L (ref 19–32)
Calcium: 9.2 mg/dL (ref 8.4–10.5)
Chloride: 99 mEq/L (ref 96–112)
Creatinine, Ser: 0.97 mg/dL (ref 0.50–1.35)
GFR calc non Af Amer: 87 mL/min — ABNORMAL LOW (ref >90)
GLUCOSE: 129 mg/dL — AB (ref 70–99)
POTASSIUM: 4.4 meq/L (ref 3.7–5.3)
SODIUM: 137 meq/L (ref 137–147)
TOTAL PROTEIN: 7.3 g/dL (ref 6.0–8.3)
Total Bilirubin: 0.4 mg/dL (ref 0.3–1.2)

## 2013-08-06 LAB — BLOOD GAS, ARTERIAL
ACID-BASE EXCESS: 1 mmol/L (ref 0.0–2.0)
Bicarbonate: 25.2 mEq/L — ABNORMAL HIGH (ref 20.0–24.0)
O2 Content: 2 L/min
O2 Saturation: 94.5 %
PCO2 ART: 40.7 mmHg (ref 35.0–45.0)
Patient temperature: 37
TCO2: 21.2 mmol/L (ref 0–100)
pH, Arterial: 7.408 (ref 7.350–7.450)
pO2, Arterial: 74.5 mmHg — ABNORMAL LOW (ref 80.0–100.0)

## 2013-08-06 MED ORDER — ALBUTEROL SULFATE (2.5 MG/3ML) 0.083% IN NEBU
INHALATION_SOLUTION | RESPIRATORY_TRACT | Status: AC
  Administered 2013-08-06: 5 mg
  Filled 2013-08-06: qty 6

## 2013-08-06 MED ORDER — PREDNISONE 10 MG PO TABS: ORAL_TABLET | ORAL | Status: DC

## 2013-08-06 MED ORDER — PREDNISONE 10 MG PO TABS: 20.0000 mg | ORAL_TABLET | Freq: Every day | ORAL | Status: DC

## 2013-08-06 MED ORDER — METHYLPREDNISOLONE SODIUM SUCC 125 MG IJ SOLR
125.0000 mg | Freq: Once | INTRAMUSCULAR | Status: AC
  Administered 2013-08-06: 125 mg via INTRAVENOUS
  Filled 2013-08-06: qty 2

## 2013-08-06 MED ORDER — ALBUTEROL (5 MG/ML) CONTINUOUS INHALATION SOLN: 5.0000 mg/h | INHALATION_SOLUTION | Freq: Once | RESPIRATORY_TRACT | Status: AC

## 2013-08-06 MED ORDER — AZITHROMYCIN 250 MG PO TABS: ORAL_TABLET | ORAL | Status: DC

## 2013-08-06 MED ORDER — AZITHROMYCIN 250 MG PO TABS
500.0000 mg | ORAL_TABLET | Freq: Once | ORAL | Status: AC
  Administered 2013-08-06: 500 mg via ORAL
  Filled 2013-08-06: qty 2

## 2013-08-06 MED ORDER — SODIUM CHLORIDE 0.9 % IV SOLN
Freq: Once | INTRAVENOUS | Status: AC
  Administered 2013-08-06: 09:00:00 via INTRAVENOUS

## 2013-08-06 MED ORDER — IPRATROPIUM BROMIDE 0.02 % IN SOLN
0.5000 mg | Freq: Once | RESPIRATORY_TRACT | Status: AC
  Administered 2013-08-06: 0.5 mg via RESPIRATORY_TRACT
  Filled 2013-08-06: qty 2.5

## 2013-08-06 NOTE — Discharge Instructions (Signed)
Bronchitis Bronchitis is the body's way of reacting to injury and/or infection (inflammation) of the bronchi. Bronchi are the air tubes that extend from the windpipe into the lungs. If the inflammation becomes severe, it may cause shortness of breath. CAUSES  Inflammation may be caused by:  A virus.  Germs (bacteria).  Dust.  Allergens.  Pollutants and many other irritants. The cells lining the bronchial tree are covered with tiny hairs (cilia). These constantly beat upward, away from the lungs, toward the mouth. This keeps the lungs free of pollutants. When these cells become too irritated and are unable to do their job, mucus begins to develop. This causes the characteristic cough of bronchitis. The cough clears the lungs when the cilia are unable to do their job. Without either of these protective mechanisms, the mucus would settle in the lungs. Then you would develop pneumonia. Smoking is a common cause of bronchitis and can contribute to pneumonia. Stopping this habit is the single most important thing you can do to help yourself. TREATMENT   Your caregiver may prescribe an antibiotic if the cough is caused by bacteria. Also, medicines that open up your airways make it easier to breathe. Your caregiver may also recommend or prescribe an expectorant. It will loosen the mucus to be coughed up. Only take over-the-counter or prescription medicines for pain, discomfort, or fever as directed by your caregiver.  Removing whatever causes the problem (smoking, for example) is critical to preventing the problem from getting worse.  Cough suppressants may be prescribed for relief of cough symptoms.  Inhaled medicines may be prescribed to help with symptoms now and to help prevent problems from returning.  For those with recurrent (chronic) bronchitis, there may be a need for steroid medicines. SEEK IMMEDIATE MEDICAL CARE IF:   During treatment, you develop more pus-like mucus (purulent  sputum).  You have a fever.  You become progressively more ill.  You have increased difficulty breathing, wheezing, or shortness of breath. It is necessary to seek immediate medical care if you are elderly or sick from any other disease. MAKE SURE YOU:   Understand these instructions.  Will watch your condition.  Will get help right away if you are not doing well or get worse. Document Released: 07/20/2005 Document Revised: 03/22/2013 Document Reviewed: 03/14/2013 [facility] Patient Information 2014 [place]. Chronic Obstructive Pulmonary Disease Chronic obstructive pulmonary disease (COPD) is a condition in which airflow from the lungs is restricted. The lungs can never return to normal, but there are measures you can take which will improve them and make you feel better. CAUSES   Smoking.  Exposure to secondhand smoke.  Breathing in irritants such as air pollution, dust, cigarette smoke, strong odors, aerosol sprays, or paint fumes.  History of lung infections. SYMPTOMS   Deep, persistent (chronic) cough with a large amount of thick mucus.  Wheezing.  Shortness of breath, especially with physical activity.  Feeling like you cannot get enough air.  Difficulty breathing.  Rapid breaths (tachypnea).  Gray or bluish discoloration (cyanosis) of the skin, especially in fingers, toes, or lips.  Fatigue.  Weight loss.  Swelling in legs, ankles, or feet.  Fast heartbeat (tachycardia).  Frequent lung infections.   Chest tightness. DIAGNOSIS  Initial diagnosis may be based on your history, symptoms, and physical examination. Additional tests for COPD may include:  Chest X-ray.  Computed tomography (CT) scan.  Lung (pulmonary) function tests.  Blood tests. TREATMENT  Treatment focuses on making you comfortable (supportive care). Your  caregiver may prescribe medicines (inhaled or pills) to help improve your breathing. Additional treatment options may  include oxygen therapy and pulmonary rehabilitation. Treatment should also include reducing your exposure to known irritants and following a plan to stop smoking. HOME CARE INSTRUCTIONS   Take all medicines, including antibiotic medicines, as directed by your caregiver.  Use inhaled medicines as directed by your caregiver.  Avoid medicines or cough syrups that dry up your airway (antihistamines) and slow down the elimination of secretions. This decreases respiratory capacity and may lead to infections.  If you smoke, stop smoking.  Avoid exposure to smoke, chemicals, and fumes that aggravate your breathing.  Avoid contact with individuals that have a contagious illness.  Avoid extreme temperature and humidity changes.  Use humidifiers at home and at your bedside if they do not make breathing difficult.  Drink enough water and fluids to keep your urine clear or pale yellow. This loosens secretions.  Eat healthy foods. Eating smaller, more frequent meals and resting before meals may help you maintain your strength.  Ask your caregiver about the use of vitamins and mineral supplements.  Stay active. Exercise and physical activity will help maintain your ability to do things you want to do.  Balance activity with periods of rest.  Assume a position of comfort if you become short of breath.  Learn and use relaxation techniques.  Learn and use controlled breathing techniques as directed by your caregiver. Controlled breathing techniques include:  Pursed lip breathing. This breathing technique starts with breathing in (inhaling) through your nose for 1 second. Next, purse your lips as if you were going to whistle. Then breathe out (exhale) through the pursed lips for 2 seconds.  Diaphragmatic breathing. Start by putting one hand on your abdomen just above your waist. Inhale slowly through your nose. The hand on your abdomen should move out. Then exhale slowly through pursed lips. You  should be able to feel the hand on your abdomen moving in as you exhale.  Learn and use controlled coughing to clear mucus from your lungs. Controlled coughing is a series of short, progressive coughs. The steps of controlled coughing are: 1. Lean your head slightly forward. 2. Breathe in deeply using diaphragmatic breathing. 3. Try to hold your breath for 3 seconds. 4. Keep your mouth slightly open while coughing twice. 5. Spit any mucus out into a tissue. 6. Rest and repeat the steps once or twice as needed.  Receive all protective vaccines your caregiver suggests, especially pneumococcal and influenza vaccines.  Learn to manage stress.  Schedule and attend all follow-up appointments as directed by your caregiver. It is important to keep all your appointments.  Participate in pulmonary rehabilitation as directed by your caregiver.  Use home oxygen as suggested. SEEK MEDICAL CARE IF:   You are coughing up more mucus than usual.  There is a change in the color or thickness of the mucus.  Breathing is more labored than usual.  Your breathing is faster than usual.  Your skin color is more cyanotic than usual.  You are running out of the medicine you take for your breathing.  You are anxious, apprehensive, or restless.  You have a fever. SEEK IMMEDIATE MEDICAL CARE IF:   You have a rapid heart rate.  You have shortness of breath while you are resting.  You have shortness of breath that prevents you from being able to talk.  You have shortness of breath that prevents you from performing your usual physical  activities.  You have chest pain lasting longer than 5 minutes.  You have a seizure.  Your family or friends notice that you are agitated or confused. MAKE SURE YOU:   Understand these instructions.  Will watch your condition.  Will get help right away if you are not doing well or get worse. Document Released: 04/29/2005 Document Revised: 04/13/2012 Document  Reviewed: 03/16/2013 [facility] Patient Information 2014 [place].

## 2013-08-06 NOTE — ED Notes (Signed)
Chills, body aches, productive cough, yellow in color. Symtpoms x 2 days.

## 2013-08-06 NOTE — ED Notes (Signed)
IV d/c'd to left AC, catheter intact and site WNL
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
## 2013-08-07 NOTE — ED Provider Notes (Signed)
Medical screening examination/treatment/procedure(s) were conducted as a shared visit with non-physician practitioner(s) and myself.  I personally evaluated the patient during the encounter.   Patient presents for cough and congestion. Patient has a history of COPD. He reports that his entire family has been sick with upper respiratory infection type symptoms. Patient has improved with Solu-Medrol and nebulizers here in the ER. He has maintained his oxygenation. Patient is stable for outpatient treatment of bronchitis with COPD exacerbation.  Orpah Greek, MD 08/07/13 [phone]

## 2013-08-09 ENCOUNTER — Emergency Department (HOSPITAL_COMMUNITY): Payer: Medicare Other

## 2013-08-09 ENCOUNTER — Inpatient Hospital Stay (HOSPITAL_COMMUNITY)
Admission: EM | Admit: 2013-08-09 | Discharge: 2013-08-13 | DRG: 871 | Disposition: A | Discharge status: Home / Self Care | Payer: Medicare Other | Attending: Internal Medicine | Admitting: Internal Medicine

## 2013-08-09 ENCOUNTER — Encounter (HOSPITAL_COMMUNITY): Payer: Self-pay | Admitting: Emergency Medicine

## 2013-08-09 DIAGNOSIS — Z9981 Dependence on supplemental oxygen: Secondary | ICD-10-CM

## 2013-08-09 DIAGNOSIS — A419 Sepsis, unspecified organism: Principal | ICD-10-CM

## 2013-08-09 DIAGNOSIS — J449 Chronic obstructive pulmonary disease, unspecified: Secondary | ICD-10-CM

## 2013-08-09 DIAGNOSIS — D751 Secondary polycythemia: Secondary | ICD-10-CM

## 2013-08-09 DIAGNOSIS — Z96659 Presence of unspecified artificial knee joint: Secondary | ICD-10-CM

## 2013-08-09 DIAGNOSIS — J441 Chronic obstructive pulmonary disease with (acute) exacerbation: Secondary | ICD-10-CM | POA: Diagnosis present

## 2013-08-09 DIAGNOSIS — R062 Wheezing: Secondary | ICD-10-CM | POA: Diagnosis present

## 2013-08-09 DIAGNOSIS — R5383 Other fatigue: Secondary | ICD-10-CM

## 2013-08-09 DIAGNOSIS — Z720 Tobacco use: Secondary | ICD-10-CM

## 2013-08-09 DIAGNOSIS — D45 Polycythemia vera: Secondary | ICD-10-CM | POA: Diagnosis present

## 2013-08-09 DIAGNOSIS — Z833 Family history of diabetes mellitus: Secondary | ICD-10-CM

## 2013-08-09 DIAGNOSIS — F17200 Nicotine dependence, unspecified, uncomplicated: Secondary | ICD-10-CM | POA: Diagnosis present

## 2013-08-09 DIAGNOSIS — Z8249 Family history of ischemic heart disease and other diseases of the circulatory system: Secondary | ICD-10-CM

## 2013-08-09 DIAGNOSIS — J1100 Influenza due to unidentified influenza virus with unspecified type of pneumonia: Secondary | ICD-10-CM | POA: Diagnosis present

## 2013-08-09 DIAGNOSIS — R110 Nausea: Secondary | ICD-10-CM | POA: Diagnosis present

## 2013-08-09 DIAGNOSIS — R5381 Other malaise: Secondary | ICD-10-CM | POA: Diagnosis present

## 2013-08-09 DIAGNOSIS — R42 Dizziness and giddiness: Secondary | ICD-10-CM | POA: Diagnosis present

## 2013-08-09 DIAGNOSIS — R0902 Hypoxemia: Secondary | ICD-10-CM | POA: Diagnosis present

## 2013-08-09 DIAGNOSIS — J189 Pneumonia, unspecified organism: Secondary | ICD-10-CM | POA: Diagnosis present

## 2013-08-09 DIAGNOSIS — R0789 Other chest pain: Secondary | ICD-10-CM | POA: Diagnosis present

## 2013-08-09 DIAGNOSIS — K219 Gastro-esophageal reflux disease without esophagitis: Secondary | ICD-10-CM | POA: Diagnosis present

## 2013-08-09 LAB — CBC WITH DIFFERENTIAL/PLATELET
BASOS PCT: 0 % (ref 0–1)
Basophils Absolute: 0 10*3/uL (ref 0.0–0.1)
EOS PCT: 0 % (ref 0–5)
Eosinophils Absolute: 0 10*3/uL (ref 0.0–0.7)
HCT: 49.4 % (ref 39.0–52.0)
Hemoglobin: 16.9 g/dL (ref 13.0–17.0)
LYMPHS ABS: 2 10*3/uL (ref 0.7–4.0)
Lymphocytes Relative: 6 % — ABNORMAL LOW (ref 12–46)
MCH: 32.4 pg (ref 26.0–34.0)
MCHC: 34.2 g/dL (ref 30.0–36.0)
MCV: 94.6 fL (ref 78.0–100.0)
MONOS PCT: 4 % (ref 3–12)
Monocytes Absolute: 1.3 10*3/uL — ABNORMAL HIGH (ref 0.1–1.0)
Neutro Abs: 30 10*3/uL — ABNORMAL HIGH (ref 1.7–7.7)
Neutrophils Relative %: 90 % — ABNORMAL HIGH (ref 43–77)
Platelets: 227 10*3/uL (ref 150–400)
RBC: 5.22 MIL/uL (ref 4.22–5.81)
RDW: 14.7 % (ref 11.5–15.5)
WBC: 33.3 10*3/uL — ABNORMAL HIGH (ref 4.0–10.5)

## 2013-08-09 LAB — COMPREHENSIVE METABOLIC PANEL
ALK PHOS: 72 U/L (ref 39–117)
ALT: 36 U/L (ref 0–53)
AST: 33 U/L (ref 0–37)
Albumin: 3.4 g/dL — ABNORMAL LOW (ref 3.5–5.2)
BILIRUBIN TOTAL: 0.9 mg/dL (ref 0.3–1.2)
BUN: 27 mg/dL — AB (ref 6–23)
CHLORIDE: 97 meq/L (ref 96–112)
CO2: 29 meq/L (ref 19–32)
CREATININE: 1.09 mg/dL (ref 0.50–1.35)
Calcium: 9.4 mg/dL (ref 8.4–10.5)
GFR calc Af Amer: 82 mL/min — ABNORMAL LOW (ref >90)
GFR, EST NON AFRICAN AMERICAN: 71 mL/min — AB (ref >90)
Glucose, Bld: 109 mg/dL — ABNORMAL HIGH (ref 70–99)
Potassium: 4.1 mEq/L (ref 3.7–5.3)
Sodium: 140 mEq/L (ref 137–147)
Total Protein: 7.3 g/dL (ref 6.0–8.3)

## 2013-08-09 LAB — URINALYSIS, ROUTINE W REFLEX MICROSCOPIC
Glucose, UA: NEGATIVE mg/dL
Hgb urine dipstick: NEGATIVE
KETONES UR: NEGATIVE mg/dL
LEUKOCYTES UA: NEGATIVE
Nitrite: NEGATIVE
PH: 5.5 (ref 5.0–8.0)
PROTEIN: 30 mg/dL — AB
Specific Gravity, Urine: 1.029 (ref 1.005–1.030)
Urobilinogen, UA: 1 mg/dL (ref 0.0–1.0)

## 2013-08-09 LAB — PRO B NATRIURETIC PEPTIDE: PRO B NATRI PEPTIDE: 222 pg/mL — AB (ref 0–125)

## 2013-08-09 LAB — HEPATIC FUNCTION PANEL
ALBUMIN: 3.2 g/dL — AB (ref 3.5–5.2)
ALK PHOS: 66 U/L (ref 39–117)
ALT: 35 U/L (ref 0–53)
AST: 34 U/L (ref 0–37)
Bilirubin, Direct: <0.2 mg/dL (ref 0.0–0.3)
TOTAL PROTEIN: 7.2 g/dL (ref 6.0–8.3)
Total Bilirubin: 0.8 mg/dL (ref 0.3–1.2)

## 2013-08-09 LAB — CBC
HCT: 47 % (ref 39.0–52.0)
Hemoglobin: 16.3 g/dL (ref 13.0–17.0)
MCH: 32.4 pg (ref 26.0–34.0)
MCHC: 34.7 g/dL (ref 30.0–36.0)
MCV: 93.4 fL (ref 78.0–100.0)
Platelets: 228 K/uL (ref 150–400)
RBC: 5.03 MIL/uL (ref 4.22–5.81)
RDW: 14.8 % (ref 11.5–15.5)
WBC: 37.9 K/uL — ABNORMAL HIGH (ref 4.0–10.5)

## 2013-08-09 LAB — URINE MICROSCOPIC-ADD ON

## 2013-08-09 LAB — POCT I-STAT TROPONIN I: Troponin i, poc: 0 ng/mL (ref 0.00–0.08)

## 2013-08-09 LAB — HEMOGLOBIN A1C
HEMOGLOBIN A1C: 6 % — AB (ref <5.7)
MEAN PLASMA GLUCOSE: 126 mg/dL — AB (ref <117)

## 2013-08-09 LAB — TROPONIN I

## 2013-08-09 LAB — CG4 I-STAT (LACTIC ACID): Lactic Acid, Venous: 2.82 mmol/L — ABNORMAL HIGH (ref 0.5–2.2)

## 2013-08-09 LAB — CREATININE, SERUM
Creatinine, Ser: 0.96 mg/dL (ref 0.50–1.35)
GFR calc Af Amer: >90 mL/min
GFR calc non Af Amer: 87 mL/min — ABNORMAL LOW

## 2013-08-09 MED ORDER — SODIUM CHLORIDE 0.9 % IV SOLN
Freq: Once | INTRAVENOUS | Status: AC
  Administered 2013-08-09: 18:00:00 via INTRAVENOUS

## 2013-08-09 MED ORDER — LEVALBUTEROL HCL 0.63 MG/3ML IN NEBU
0.6300 mg | INHALATION_SOLUTION | Freq: Four times a day (QID) | RESPIRATORY_TRACT | Status: DC
  Administered 2013-08-09 – 2013-08-13 (×15): 0.63 mg via RESPIRATORY_TRACT
  Filled 2013-08-09 (×31): qty 3

## 2013-08-09 MED ORDER — METHYLPREDNISOLONE SODIUM SUCC 125 MG IJ SOLR
125.0000 mg | Freq: Once | INTRAMUSCULAR | Status: AC
  Administered 2013-08-09: 125 mg via INTRAVENOUS
  Filled 2013-08-09: qty 2

## 2013-08-09 MED ORDER — SODIUM CHLORIDE 0.9 % IV BOLUS (SEPSIS): 500.0000 mL | Freq: Once | INTRAVENOUS | Status: DC

## 2013-08-09 MED ORDER — MOMETASONE FURO-FORMOTEROL FUM 200-5 MCG/ACT IN AERO
2.0000 | INHALATION_SPRAY | Freq: Two times a day (BID) | RESPIRATORY_TRACT | Status: DC
  Administered 2013-08-10 – 2013-08-13 (×7): 2 via RESPIRATORY_TRACT
  Filled 2013-08-09: qty 8.8

## 2013-08-09 MED ORDER — ENOXAPARIN SODIUM 40 MG/0.4ML ~~LOC~~ SOLN
40.0000 mg | SUBCUTANEOUS | Status: DC
  Filled 2013-08-09: qty 0.4

## 2013-08-09 MED ORDER — ONDANSETRON HCL 4 MG PO TABS: 4.0000 mg | ORAL_TABLET | Freq: Four times a day (QID) | ORAL | Status: DC | PRN

## 2013-08-09 MED ORDER — SODIUM CHLORIDE 0.9 % IV SOLN
INTRAVENOUS | Status: DC
Start: 2013-08-09 — End: 2013-08-13
  Administered 2013-08-09 – 2013-08-11 (×5): via INTRAVENOUS
  Administered 2013-08-12: 75 mL/h via INTRAVENOUS

## 2013-08-09 MED ORDER — ACETAMINOPHEN 325 MG PO TABS: 650.0000 mg | ORAL_TABLET | Freq: Four times a day (QID) | ORAL | Status: DC | PRN

## 2013-08-09 MED ORDER — ONDANSETRON HCL 4 MG/2ML IJ SOLN: 4.0000 mg | Freq: Four times a day (QID) | INTRAMUSCULAR | Status: DC | PRN

## 2013-08-09 MED ORDER — ENOXAPARIN SODIUM 60 MG/0.6ML ~~LOC~~ SOLN
60.0000 mg | SUBCUTANEOUS | Status: DC
  Administered 2013-08-09 – 2013-08-12 (×4): 60 mg via SUBCUTANEOUS
  Filled 2013-08-09 (×5): qty 0.6

## 2013-08-09 MED ORDER — ACETAMINOPHEN 650 MG RE SUPP: 650.0000 mg | Freq: Four times a day (QID) | RECTAL | Status: DC | PRN

## 2013-08-09 MED ORDER — SODIUM CHLORIDE 0.9 % IV BOLUS (SEPSIS)
500.0000 mL | INTRAVENOUS | Status: AC
  Administered 2013-08-09: 1000 mL via INTRAVENOUS

## 2013-08-09 MED ORDER — ALBUTEROL SULFATE (2.5 MG/3ML) 0.083% IN NEBU
2.5000 mg | INHALATION_SOLUTION | Freq: Once | RESPIRATORY_TRACT | Status: AC
  Administered 2013-08-09: 2.5 mg via RESPIRATORY_TRACT
  Filled 2013-08-09: qty 3

## 2013-08-09 MED ORDER — METHYLPREDNISOLONE SODIUM SUCC 125 MG IJ SOLR
60.0000 mg | Freq: Three times a day (TID) | INTRAMUSCULAR | Status: DC
  Administered 2013-08-09 – 2013-08-11 (×5): 60 mg via INTRAVENOUS
  Filled 2013-08-09 (×8): qty 0.96

## 2013-08-09 MED ORDER — DEXTROSE 5 % IV SOLN
500.0000 mg | Freq: Once | INTRAVENOUS | Status: AC
  Administered 2013-08-09: 500 mg via INTRAVENOUS

## 2013-08-09 MED ORDER — DOCUSATE SODIUM 100 MG PO CAPS
100.0000 mg | ORAL_CAPSULE | Freq: Two times a day (BID) | ORAL | Status: DC
  Administered 2013-08-09 – 2013-08-13 (×7): 100 mg via ORAL
  Filled 2013-08-09 (×9): qty 1

## 2013-08-09 MED ORDER — DEXTROSE 5 % IV SOLN
500.0000 mg | INTRAVENOUS | Status: DC
  Administered 2013-08-10 – 2013-08-12 (×3): 500 mg via INTRAVENOUS
  Filled 2013-08-09 (×5): qty 500

## 2013-08-09 MED ORDER — SODIUM CHLORIDE 0.9 % IJ SOLN
3.0000 mL | Freq: Two times a day (BID) | INTRAMUSCULAR | Status: DC
  Administered 2013-08-09 – 2013-08-12 (×3): 3 mL via INTRAVENOUS

## 2013-08-09 MED ORDER — BENZONATATE 100 MG PO CAPS
200.0000 mg | ORAL_CAPSULE | Freq: Three times a day (TID) | ORAL | Status: DC | PRN
  Administered 2013-08-11 – 2013-08-12 (×3): 200 mg via ORAL
  Filled 2013-08-09 (×4): qty 2

## 2013-08-09 MED ORDER — DEXTROSE 5 % IV SOLN
1.0000 g | INTRAVENOUS | Status: DC
Start: 2013-08-10 — End: 2013-08-13
  Administered 2013-08-09 – 2013-08-13 (×4): 1 g via INTRAVENOUS
  Filled 2013-08-09 (×6): qty 10

## 2013-08-09 MED ORDER — DEXTROSE 5 % IV SOLN
1.0000 g | Freq: Once | INTRAVENOUS | Status: AC
  Administered 2013-08-09: 1 g via INTRAVENOUS
  Filled 2013-08-09: qty 10

## 2013-08-09 MED ORDER — IPRATROPIUM BROMIDE 0.02 % IN SOLN
0.5000 mg | Freq: Four times a day (QID) | RESPIRATORY_TRACT | Status: DC
  Administered 2013-08-09 – 2013-08-13 (×14): 0.5 mg via RESPIRATORY_TRACT
  Filled 2013-08-09 (×14): qty 2.5

## 2013-08-09 NOTE — ED Notes (Signed)
Called report to 2 W. RN states that bed is not clean and they have put in for a "stat clean".

## 2013-08-09 NOTE — ED Notes (Signed)
Bed: WA07 Expected date:  Expected time:  Means of arrival:  Comments: EMS-SOB 

## 2013-08-09 NOTE — ED Provider Notes (Signed)
CSN: [id number]     Arrival date & time 08/09/13  1452 History   First MD Initiated Contact with Patient 08/09/13 1458     Chief Complaint  Patient presents with  . COPD   (Consider location/radiation/quality/duration/timing/severity/associated sxs/prior Treatment) HPI Patient presents with dyspnea, cough, congestion, generalized fatigue. Symptoms began approximately 6 days ago.  Since onset symptoms have been progressive.  The patient was seen at another facility 2 days ago, started on azithromycin and steroids.  He notes that his symptoms have progressed in spite of taking all medication as directed. Today he had a fever as well. No new lightheadedness, syncope, confusion, disorientation, edema. Patient does note chest pressure diffusely across the anterior thorax. History of present illness is according to the patient's family members.   Past Medical History  Diagnosis Date  . COPD (chronic obstructive pulmonary disease)     Probable - long-standing tobacco abuse  . Chronic pain     Bilateral knees and shoulders  . Pneumonia     Managed as outpatient  . GERD (gastroesophageal reflux disease)   . 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 [name]   Past Surgical History  Procedure Laterality Date  . Right total knee arthroplasty  2003  . Left total knee arthroplasty  1998   Family History  Problem Relation Age of Onset  . Diabetes type II Father   . Coronary artery disease Father     MI in his 11s  . Diabetes Mother   . Diabetes Brother   . Diabetes Sister   . Asthma Sister    History  Substance Use Topics  . Smoking status: Current Every Day Smoker -- 0.20 packs/day    Types: Cigarettes    Start date: 08/03/1962  . Smokeless tobacco: Never Used     Comment: using e-cig and smoking 2-3 cig per day  . 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  Endocrine:       Negative aside from HPI  Genitourinary:       Neg aside from HPI   Musculoskeletal:       Per HPI, otherwise negative  Skin: Negative.   Neurological: Negative for syncope.    Allergies  Review of patient's allergies indicates no known allergies.  Home Medications   Current Outpatient Rx  Name  Route  Sig  Dispense  Refill  . albuterol (VENTOLIN HFA) 108 (90 BASE) MCG/ACT inhaler   Inhalation   Inhale 2 puffs into the lungs every 4 (four) hours as needed.   1 Inhaler   2   . azithromycin (ZITHROMAX) 250 MG tablet      One tablet a day   4 tablet   0   . benzonatate (TESSALON) 200 MG capsule   Oral   Take 1 capsule (200 mg total) by mouth 3 (three) times daily as needed for cough.   30 capsule   1   . cefdinir (OMNICEF) 300 MG capsule      Take 2 capsules each morning for 5 days   10 capsule   0   . fluticasone (FLONASE) 50  MCG/ACT nasal spray   Nasal   Place 2 sprays into the nose 2 (two) times daily.   48 g   3     90 day supply   . mometasone-formoterol (DULERA) 200-5 MCG/ACT AERO   Inhalation   Inhale 2 puffs into the lungs 2 (two) times daily.   1 Inhaler   5   . predniSONE (DELTASONE) 10 MG tablet      6,5,4,3,2,1   21 tablet   0   . tiotropium (SPIRIVA HANDIHALER) 18 MCG inhalation capsule   Inhalation   Place 1 capsule (18 mcg total) into inhaler and inhale daily.   30 capsule   6    SpO2 92% Physical Exam  Nursing note and vitals reviewed. Constitutional: He is oriented to person, place, and time. He appears well-developed. He appears ill. No distress.  HENT:  Head: Normocephalic and atraumatic.  Eyes: Conjunctivae and EOM are normal.  Cardiovascular: Normal rate and regular rhythm.   Pulmonary/Chest: Effort normal. No stridor. No respiratory  distress.  Abdominal: He exhibits no distension.  Musculoskeletal: He exhibits no edema.  Neurological: He is alert and oriented to person, place, and time.  Skin: Skin is warm and dry.  Psychiatric: He has a normal mood and affect.    ED Course  Procedures (including critical care time) Labs Review Labs Reviewed  CBC WITH DIFFERENTIAL  COMPREHENSIVE METABOLIC PANEL  PRO B NATRIURETIC PEPTIDE  URINALYSIS, ROUTINE W REFLEX MICROSCOPIC   Imaging Review No results found.  EKG Interpretation    Date/Time:  Wednesday August 09 2013 15:23:41 EST Ventricular Rate:  106 PR Interval:  144 QRS Duration: 88 QT Interval:  348 QTC Calculation: 462 R Axis:   75 Text Interpretation:  Sinus tachycardia Low voltage, extremity leads Posterior infarct, old Baseline wander in lead(s) V4 V5 Sinus tachycardia Artifact Abnormal ekg Confirmed by Carmin Muskrat  MD [phone]) on 08/09/2013 3:45:58 PM           After the initial evaluation I reviewed the patient's chart. The patient was seen in 2 days ago. The patient has oxygen saturation 92% on nasal cannula, this is abnormal I interpret the x-ray, discussed the results with the patient and his wife. MDM   1. CAP (community acquired pneumonia)    Patient presents with ongoing dyspnea, diaphoresis, fever at home.  With patient's history, though he was in the immediate concern.  This was demonstrated on x-ray.  Patient's notable leukocytosis.  Patient's vital signs were largely reassuring, though with mild hypotension he received fluid bolus, confusion.  He received antibiotics, was admitted for further evaluation and management.    Carmin Muskrat, MD 08/09/13 [phone]

## 2013-08-09 NOTE — ED Notes (Signed)
Pt is aware of the need for urine. Urinal at bedside.  

## 2013-08-09 NOTE — H&P (Signed)
[facility] History and Physical  Allen Green YPP:[id number] DOB: Mar 26, 1951 DOA: 08/09/2013  Referring physician:  PCP: Lauraine Rinne, MD   Chief Complaint: Shortness of breath   HPI:  63 year old male with a history of advanced COPD, polycythemia followed by Dr. Lamonte Sakai, presents to have a pen ED on 1/40 with myalgias, cough yellow productive, white count of 12,000, chest x-ray that is consistent with acute bronchitis but no pneumonia, discharged on azithromycin and prednisone. Subsequently presents today with worsening blood pressure systolic blood pressure in the 80s upon presentation, patient has also had some nausea no vomiting and diarrhea. He did not get tested for influenza. He is also been lightheaded has had some confusion disorientation. Denies any chest pain WBC count of 33,000, elevated lactic acid, According to pulmonology, the patient continues to smoke 2 to a half pack per day. He is home oxygen dependent on 2 L..    Review of Systems: negative for the following  Constitutional: As in history of present illness HEENT: Denies photophobia, eye pain, redness, hearing loss, ear pain, congestion, sore throat, rhinorrhea, sneezing, mouth sores, trouble swallowing, neck pain, neck stiffness and tinnitus.  Respiratory: Denies SOB, DOE, cough, chest tightness, and wheezing.  Cardiovascular: As in history of present illness Gastrointestinal: Denies nausea, vomiting, abdominal pain, diarrhea, constipation, blood in stool and abdominal distention.  Genitourinary: Denies dysuria, urgency, frequency, hematuria, flank pain and difficulty urinating.  Musculoskeletal: Denies myalgias, back pain, joint swelling, arthralgias and gait problem.  Skin: Denies pallor, rash and wound.  Neurological: Denies dizziness, seizures, syncope, weakness, light-headedness, numbness and headaches.  Hematological: Denies adenopathy. Easy bruising, personal or family bleeding history   Psychiatric/Behavioral: Denies suicidal ideation, mood changes, confusion, nervousness, sleep disturbance and agitation       Past Medical History  Diagnosis Date  . COPD (chronic obstructive pulmonary disease)     Probable - long-standing tobacco abuse  . Chronic pain     Bilateral knees and shoulders  . Pneumonia     Managed as outpatient  . GERD (gastroesophageal reflux disease)   . 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 [name]     Past Surgical History  Procedure Laterality Date  . Right total knee arthroplasty  2003  . Left total knee arthroplasty  1998    "left knee replacement is broken" - 08/09/13      Social History:  reports that he has been smoking Cigarettes.  He started smoking about 51 years ago. He has a 100 pack-year smoking history. He has never used smokeless tobacco. He reports that he does not drink alcohol or use illicit drugs.    No Known Allergies  Family History  Problem Relation Age of Onset  . Diabetes type II Father   . Coronary artery disease Father     MI in his 51s  . Diabetes Mother   . Diabetes Brother   . Diabetes Sister   . Asthma Sister      Prior to Admission medications   Medication Sig Start Date End Date Taking? Authorizing Provider  acetaminophen (TYLENOL) 500 MG tablet Take 500 mg by mouth every 6 (six) hours as needed for moderate pain or fever.   Yes Historical Provider, MD  albuterol (PROVENTIL HFA;VENTOLIN HFA) 108 (90 BASE) MCG/ACT inhaler Inhale 2 puffs into the lungs every 4 (four) hours as needed for wheezing or shortness of breath.   Yes Historical Provider, MD  azithromycin (ZITHROMAX) 250 MG tablet One tablet a day 08/06/13  Yes Hollace Kinnier Sofia, PA-C  azithromycin (ZITHROMAX) 250 MG tablet Take 250 mg by mouth daily. 08/07/13 08/14/13 Yes Historical Provider, MD  benzonatate (TESSALON) 200 MG capsule Take 1 capsule (200 mg total) by mouth 3 (three) times daily as needed for cough. 03/23/13  Yes Collene Gobble, MD   fluticasone (FLONASE) 50 MCG/ACT nasal spray Place 2 sprays into the nose 2 (two) times daily. 01/25/13  Yes Collene Gobble, MD  mometasone-formoterol (DULERA) 200-5 MCG/ACT AERO Inhale 2 puffs into the lungs 2 (two) times daily. 04/11/13  Yes Tammy S Parrett, NP  predniSONE (DELTASONE) 10 MG tablet Take 10 mg by mouth daily.   Yes Historical Provider, MD  tiotropium (SPIRIVA HANDIHALER) 18 MCG inhalation capsule Place 1 capsule (18 mcg total) into inhaler and inhale daily. 08/01/13 08/01/14 Yes Collene Gobble, MD     Physical Exam: Filed Vitals:   08/09/13 1454 08/09/13 1523 08/09/13 1552 08/09/13 1753  BP:  97/56  102/68  Pulse:  105  109  Temp:  98.6 F (37 C)  98.8 F (37.1 C)  TempSrc:  Oral  Oral  Resp:  24  21  SpO2: 92% 93% 94% 92%     Constitutional: Vital signs reviewed. Patient is a well-developed and well-nourished in no acute distress and cooperative with exam. Alert and oriented x3.  Head: Normocephalic and atraumatic  Ear: TM normal bilaterally  Mouth: no erythema or exudates, MMM  Eyes: PERRL, EOMI, conjunctivae normal, No scleral icterus.  Neck: Supple, Trachea midline normal ROM, No JVD, mass, thyromegaly, or carotid bruit present.  Cardiovascular: RRR, S1 normal, S2 normal, no MRG, pulses symmetric and intact bilaterally  Pulmonary/Chest: CTAB, no wheezes, rales, or rhonchi  Abdominal: Soft. Non-tender, non-distended, bowel sounds are normal, no masses, organomegaly, or guarding present.  GU: no CVA tenderness Musculoskeletal: No joint deformities, erythema, or stiffness, ROM full and no nontender Ext: no edema and no cyanosis, pulses palpable bilaterally (DP and PT)  Hematology: no cervical, inginal, or axillary adenopathy.  Neurological: A&O x3, Strenght is normal and symmetric bilaterally, cranial nerve II-XII are grossly intact, no focal motor deficit, sensory intact to light touch bilaterally.  Skin: Warm, dry and intact. No rash, cyanosis, or clubbing.   Psychiatric: Normal mood and affect. speech and behavior is normal. Judgment and thought content normal. Cognition and memory are normal.       Labs on Admission:    Basic Metabolic Panel:  Recent Labs Lab 08/06/13 0908 08/09/13 1550  NA 137 140  K 4.4 4.1  CL 99 97  CO2 28 29  GLUCOSE 129* 109*  BUN 18 27*  CREATININE 0.97 1.09  CALCIUM 9.2 9.4   Liver Function Tests:  Recent Labs Lab 08/06/13 0908 08/09/13 1550  AST 24 33  ALT 43 36  ALKPHOS 73 72  BILITOT 0.4 0.9  PROT 7.3 7.3  ALBUMIN 3.7 3.4*   No results found for this basename: LIPASE, AMYLASE,  in the last 168 hours No results found for this basename: AMMONIA,  in the last 168 hours CBC:  Recent Labs Lab 08/06/13 0908 08/09/13 1550  WBC 12.3* 33.3*  NEUTROABS 9.7* 30.0*  HGB 16.9 16.9  HCT 48.9 49.4  MCV 95.1 94.6  PLT 216 227   Cardiac Enzymes: No results found for this basename: CKTOTAL, CKMB, CKMBINDEX, TROPONINI,  in the last 168 hours  BNP (last 3 results)  Recent Labs  08/09/13 1550  PROBNP 222.0*      CBG: No results  found for this basename: GLUCAP,  in the last 168 hours  Radiological Exams on Admission: Dg Chest 2 View  08/09/2013   CLINICAL DATA:  Cough and dyspnea with history of COPD  EXAM: CHEST  2 VIEW  COMPARISON:  PA and lateral chest x-ray dated August 06, 2012  FINDINGS: Since the previous study there have developed confluent interstitial and alveolar densities in the right mid and lower lung and at the left lung base. There is no pleural effusion. The pulmonary vascularity remains prominent. The cardiac silhouette is top-normal in size. The mediastinum is normal in width.  IMPRESSION: The findings are consistent with bilateral interstitial and alveolar pneumonia superimposed upon underlying changes of COPD. The findings are most conspicuous on the right.   Electronically Signed   By: David  Martinique   On: 08/09/2013 16:15    EKG: Independently reviewed.  EKG  Interpretation    Date/Time: Wednesday August 09 2013 15:23:41 EST  Ventricular Rate: 106  PR Interval: 144  QRS Duration: 88  QT Interval: 348  QTC Calculation: 462  R Axis: 75     Assessment/Plan Active Problems:   CAP (community acquired pneumonia)   Pneumonia   Sepsis secondary to community-acquired pneumonia Admitted to step down because of low blood pressure Blood culture x2 Azithromycin/Rocephin IV Solu-Medrol given COPD, oxygen dependence Nebulizer treatments, hold Spiriva Aggressive IV hydration Repeat bolus tonight Continue normal saline at 125 cc an hour  Polycythemia likely secondary to tobacco use  Code Status:   full Family Communication: bedside Disposition Plan: admit   Time spent: 70 mins   [facility] Pager [phone]  If 7PM-7AM, please contact night-coverage [email or web address] Password [facility] 08/09/2013, 6:36 PM

## 2013-08-09 NOTE — ED Notes (Signed)
RT called for neb tx.

## 2013-08-09 NOTE — ED Notes (Signed)
Per EMS pt coming from doctor's office with c/o COPD exacerbation. Per EMS pt was seen at [facility] hospital and was given zpack and prednisone without relief. Pt is on home o2 at 2 L.

## 2013-08-09 NOTE — ED Notes (Signed)
Pt is still aware of the need for urine.

## 2013-08-09 NOTE — Progress Notes (Signed)
Utilization Review completed.  Vesna Kable RN CM  

## 2013-08-09 NOTE — ED Notes (Signed)
Called 2 W. They stated that ES is still not in room to clean it. Charge RN Terri aware.

## 2013-08-10 DIAGNOSIS — J449 Chronic obstructive pulmonary disease, unspecified: Secondary | ICD-10-CM

## 2013-08-10 DIAGNOSIS — D751 Secondary polycythemia: Secondary | ICD-10-CM

## 2013-08-10 DIAGNOSIS — A419 Sepsis, unspecified organism: Secondary | ICD-10-CM

## 2013-08-10 LAB — COMPREHENSIVE METABOLIC PANEL
ALBUMIN: 2.9 g/dL — AB (ref 3.5–5.2)
ALT: 30 U/L (ref 0–53)
AST: 22 U/L (ref 0–37)
Alkaline Phosphatase: 71 U/L (ref 39–117)
BUN: 25 mg/dL — ABNORMAL HIGH (ref 6–23)
CALCIUM: 9.3 mg/dL (ref 8.4–10.5)
CO2: 28 mEq/L (ref 19–32)
CREATININE: 0.84 mg/dL (ref 0.50–1.35)
Chloride: 98 mEq/L (ref 96–112)
GFR calc Af Amer: >90 mL/min (ref >90)
GFR calc non Af Amer: >90 mL/min (ref >90)
Glucose, Bld: 141 mg/dL — ABNORMAL HIGH (ref 70–99)
Potassium: 4.3 mEq/L (ref 3.7–5.3)
Sodium: 137 mEq/L (ref 137–147)
TOTAL PROTEIN: 7 g/dL (ref 6.0–8.3)
Total Bilirubin: 0.3 mg/dL (ref 0.3–1.2)

## 2013-08-10 LAB — CBC
HCT: 45.5 % (ref 39.0–52.0)
HEMOGLOBIN: 15.6 g/dL (ref 13.0–17.0)
MCH: 32.4 pg (ref 26.0–34.0)
MCHC: 34.3 g/dL (ref 30.0–36.0)
MCV: 94.4 fL (ref 78.0–100.0)
Platelets: 228 10*3/uL (ref 150–400)
RBC: 4.82 MIL/uL (ref 4.22–5.81)
RDW: 14.7 % (ref 11.5–15.5)
WBC: 38.5 10*3/uL — ABNORMAL HIGH (ref 4.0–10.5)

## 2013-08-10 LAB — INFLUENZA PANEL BY PCR (TYPE A & B)
H1N1 flu by pcr: NOT DETECTED
INFLBPCR: NEGATIVE
Influenza A By PCR: POSITIVE — AB

## 2013-08-10 LAB — GLUCOSE, CAPILLARY
GLUCOSE-CAPILLARY: 126 mg/dL — AB (ref 70–99)
GLUCOSE-CAPILLARY: 124 mg/dL — AB (ref 70–99)
Glucose-Capillary: 219 mg/dL — ABNORMAL HIGH (ref 70–99)

## 2013-08-10 LAB — TROPONIN I: Troponin I: <0.3 ng/mL (ref <0.30)

## 2013-08-10 LAB — MRSA PCR SCREENING: MRSA BY PCR: NEGATIVE

## 2013-08-10 MED ORDER — OSELTAMIVIR PHOSPHATE 75 MG PO CAPS
75.0000 mg | ORAL_CAPSULE | Freq: Two times a day (BID) | ORAL | Status: DC
  Administered 2013-08-10 – 2013-08-13 (×7): 75 mg via ORAL
  Filled 2013-08-10 (×9): qty 1

## 2013-08-10 MED ORDER — NICOTINE 14 MG/24HR TD PT24
14.0000 mg | MEDICATED_PATCH | Freq: Every day | TRANSDERMAL | Status: DC
  Administered 2013-08-10 – 2013-08-13 (×4): 14 mg via TRANSDERMAL
  Filled 2013-08-10 (×4): qty 1

## 2013-08-10 NOTE — Progress Notes (Signed)
[facility] PROGRESS NOTE  ABHIRAJ DOZAL IOX:[id number] DOB: 1950-08-28 DOA: 08/09/2013 PCP: Lauraine Rinne, MD  Assessment/Plan: 1. Sepsis with CAP 1. Pt with WBC in the 30K range and initial HR over 100's with bilateral pneumonia 2. Tolerating abx 3. Afebrile 2. COPD 1. End-expiratory wheezing on exam 2. On IV steroids (solumedrol 60 q8h), wean as tolerated 3. On q6 xopenex nebs 3. Polycythemia 1. Likely secondary to chronic hypoxia from tobacco abuse 2. Stable 4. Tobacco abuse 1. Reports officially quitting as of now 2. Nicotine patch ordered 5. DVT prophylaxis 1. Lovenox  Code Status: Full Family Communication: Pt in room (indicate person spoken with, relationship, and if by phone, the number) Disposition Plan: Pending  Antibiotics:  Azithromycin 08/09/13>>>  Ceftriaxone 08/09/13>>>  HPI/Subjective: No acute events overnight. Reports feeling a little better today.  Objective: Filed Vitals:   08/10/13 0400 08/10/13 0500 08/10/13 0600 08/10/13 0808  BP: 105/64 129/62 131/73   Pulse: 80 80 81   Temp:      TempSrc:      Resp: 13 13 19    Height:      Weight:      SpO2: 95% 95% 95% 95%    Intake/Output Summary (Last 24 hours) at 08/10/13 0825 Last data filed at 08/10/13 0600  Gross per 24 hour  Intake    928 ml  Output    475 ml  Net    453 ml   Filed Weights   08/09/13 1842  Weight: 117.935 kg (260 lb)    Exam:   General:  Awake, in nad  Cardiovascular: regular, s1, s2  Respiratory: normal resp effort, end-expiratory wheezing  Abdomen: soft, nondistended  Musculoskeletal: perfused, no clubbing   Data Reviewed: Basic Metabolic Panel:  Recent Labs Lab 08/06/13 0908 08/09/13 1550 08/09/13 1845 08/10/13 0648  NA 137 140  --  137  K 4.4 4.1  --  4.3  CL 99 97  --  98  CO2 28 29  --  28  GLUCOSE 129* 109*  --  141*  BUN 18 27*  --  25*  CREATININE 0.97 1.09 0.96 0.84  CALCIUM 9.2 9.4  --  9.3   Liver Function Tests:  Recent  Labs Lab 08/06/13 0908 08/09/13 1550 08/09/13 1845 08/10/13 0648  AST 24 33 34 22  ALT 43 36 35 30  ALKPHOS 73 72 66 71  BILITOT 0.4 0.9 0.8 0.3  PROT 7.3 7.3 7.2 7.0  ALBUMIN 3.7 3.4* 3.2* 2.9*   No results found for this basename: LIPASE, AMYLASE,  in the last 168 hours No results found for this basename: AMMONIA,  in the last 168 hours CBC:  Recent Labs Lab 08/06/13 0908 08/09/13 1550 08/09/13 1845 08/10/13 0648  WBC 12.3* 33.3* 37.9* 38.5*  NEUTROABS 9.7* 30.0*  --   --   HGB 16.9 16.9 16.3 15.6  HCT 48.9 49.4 47.0 45.5  MCV 95.1 94.6 93.4 94.4  PLT 216 227 228 228   Cardiac Enzymes:  Recent Labs Lab 08/09/13 1845 08/10/13 0045 08/10/13 0648  TROPONINI <0.30 <0.30 <0.30   BNP (last 3 results)  Recent Labs  08/09/13 1550  PROBNP 222.0*   CBG: No results found for this basename: GLUCAP,  in the last 168 hours  Recent Results (from the past 240 hour(s))  MRSA PCR SCREENING     Status: None   Collection Time    08/09/13 10:36 PM      Result Value Range Status   MRSA by  PCR NEGATIVE  NEGATIVE Final   Comment:            The GeneXpert MRSA Assay (FDA     approved for NASAL specimens     only), is one component of a     comprehensive MRSA colonization     surveillance program. It is not     intended to diagnose MRSA     infection nor to guide or     monitor treatment for     MRSA infections.     Studies: Dg Chest 2 View  08/09/2013   CLINICAL DATA:  Cough and dyspnea with history of COPD  EXAM: CHEST  2 VIEW  COMPARISON:  PA and lateral chest x-ray dated August 06, 2012  FINDINGS: Since the previous study there have developed confluent interstitial and alveolar densities in the right mid and lower lung and at the left lung base. There is no pleural effusion. The pulmonary vascularity remains prominent. The cardiac silhouette is top-normal in size. The mediastinum is normal in width.  IMPRESSION: The findings are consistent with bilateral interstitial  and alveolar pneumonia superimposed upon underlying changes of COPD. The findings are most conspicuous on the right.   Electronically Signed   By: David  Martinique   On: 08/09/2013 16:15    Scheduled Meds: . azithromycin  500 mg Intravenous Q24H  . cefTRIAXone (ROCEPHIN)  IV  1 g Intravenous Q24H  . docusate sodium  100 mg Oral BID  . enoxaparin (LOVENOX) injection  60 mg Subcutaneous Q24H  . ipratropium  0.5 mg Nebulization Q6H  . levalbuterol  0.63 mg Nebulization Q6H  . methylPREDNISolone sodium succinate  60 mg Intravenous Q8H  . mometasone-formoterol  2 puff Inhalation BID  . sodium chloride  500 mL Intravenous Once  . sodium chloride  3 mL Intravenous Q12H   Continuous Infusions: . sodium chloride 125 mL/hr at 08/10/13 0600    Active Problems:   CAP (community acquired pneumonia)   Pneumonia   Sepsis  Time spent: 68min  [facility], [place] [facility] Pager [phone]. If 7PM-7AM, please contact night-coverage at [email or web address], password [facility] 08/10/2013, 8:25 AM  LOS: 1 day

## 2013-08-11 ENCOUNTER — Telehealth: Payer: Self-pay | Admitting: Emergency Medicine

## 2013-08-11 ENCOUNTER — Ambulatory Visit: Payer: Medicare Other | Admitting: Emergency Medicine

## 2013-08-11 DIAGNOSIS — J189 Pneumonia, unspecified organism: Secondary | ICD-10-CM

## 2013-08-11 DIAGNOSIS — J449 Chronic obstructive pulmonary disease, unspecified: Secondary | ICD-10-CM

## 2013-08-11 LAB — CBC
HCT: 42 % (ref 39.0–52.0)
Hemoglobin: 14 g/dL (ref 13.0–17.0)
MCH: 31.1 pg (ref 26.0–34.0)
MCHC: 33.3 g/dL (ref 30.0–36.0)
MCV: 93.3 fL (ref 78.0–100.0)
PLATELETS: 259 10*3/uL (ref 150–400)
RBC: 4.5 MIL/uL (ref 4.22–5.81)
RDW: 14.3 % (ref 11.5–15.5)
WBC: 35.8 10*3/uL — AB (ref 4.0–10.5)

## 2013-08-11 LAB — BASIC METABOLIC PANEL
BUN: 24 mg/dL — ABNORMAL HIGH (ref 6–23)
CHLORIDE: 99 meq/L (ref 96–112)
CO2: 28 mEq/L (ref 19–32)
Calcium: 9.1 mg/dL (ref 8.4–10.5)
Creatinine, Ser: 0.83 mg/dL (ref 0.50–1.35)
GFR calc non Af Amer: >90 mL/min (ref >90)
Glucose, Bld: 163 mg/dL — ABNORMAL HIGH (ref 70–99)
Potassium: 4.1 mEq/L (ref 3.7–5.3)
Sodium: 138 mEq/L (ref 137–147)

## 2013-08-11 LAB — GLUCOSE, CAPILLARY
GLUCOSE-CAPILLARY: 136 mg/dL — AB (ref 70–99)
Glucose-Capillary: 152 mg/dL — ABNORMAL HIGH (ref 70–99)
Glucose-Capillary: 165 mg/dL — ABNORMAL HIGH (ref 70–99)

## 2013-08-11 MED ORDER — METHYLPREDNISOLONE SODIUM SUCC 125 MG IJ SOLR
60.0000 mg | Freq: Two times a day (BID) | INTRAMUSCULAR | Status: DC
  Administered 2013-08-11 – 2013-08-13 (×4): 60 mg via INTRAVENOUS
  Filled 2013-08-11 (×5): qty 0.96

## 2013-08-11 NOTE — Telephone Encounter (Signed)
Ok thank you 

## 2013-08-11 NOTE — Telephone Encounter (Signed)
I called and spoke with spouse [place]. She reports pt has been admitted since 08/09/13 w/ PNA in both lungs and the flu. Pt currently at [facility]. I advised will let RB know.

## 2013-08-11 NOTE — Clinical Documentation Improvement (Signed)
Pt on home O2 Possible Clinical Conditions?  Acute on Chronic Respiratory Failure Chronic Respiratory Failure  Other Condition Cannot Clinically Determine   Supporting Information:  Risk Factors:CAP, advanced COPD, Tobacco abuse, MIW:[id number] secondary to hypoxia   Signs & Symptoms:note 08/10/13: "Polycythemia   Likely secondary to chronic hypoxia from tobacco abuse Note 08/09/13:":smoke 2 to a half pack per day. He is home oxygen dependent on 2 L.."  Treatment: IV steroids (solumedrol 60 q8h), wean as tolerated   // q6 xopenex nebs Albuterol inhale 2 puffs prn q4hr for wheezing or SOB  Thank You, Philippa Chester ,RN Clinical Documentation Specialist:  [place] Information Management

## 2013-08-11 NOTE — Progress Notes (Signed)
[facility] PROGRESS NOTE  Allen Green GLO:[id number] DOB: 1950-11-30 DOA: 08/09/2013 PCP: Lauraine Rinne, MD  Assessment/Plan: 1. Sepsis with CAP and Influenza 1. Pt with WBC in the mid-30K range and initial HR over 100's with bilateral pneumonia 2. Tolerating abx 3. Flu +, started tamiflu 08/10/13 4. Afebrile 5. Reports feeling better 6. Would obtain repeat cxr tomorrow, if improved, consider tx to floor 2. Influenza 1. Started tamiflu per above 3. COPD 1. End-expiratory wheezing on exam 2. On IV steroids (solumedrol 60 q8h), wean as tolerated 3. On q6 xopenex nebs 4. Polycythemia 1. Likely secondary to chronic hypoxia from tobacco abuse 2. Stable 5. Tobacco abuse 1. Reports officially quitting as of now 2. Nicotine patch ordered 6. DVT prophylaxis 1. Lovenox  Code Status: Full Family Communication: Pt in room (indicate person spoken with, relationship, and if by phone, the number) Disposition Plan: Pending  Antibiotics:  Azithromycin 08/09/13>>>  Ceftriaxone 08/09/13>>>  Tamiflu 08/10/13>>>  HPI/Subjective: No acute events overnight. Reports feeling a little better today.  Objective: Filed Vitals:   08/11/13 0330 08/11/13 0400 08/11/13 0800 08/11/13 0848  BP: 126/64   141/54  Pulse: 91     Temp:  98.8 F (37.1 C) 96.9 F (36.1 C)   TempSrc:  Oral Oral   Resp: 16   20  Height:      Weight:      SpO2: 92%       Intake/Output Summary (Last 24 hours) at 08/11/13 0854 Last data filed at 08/11/13 0800  Gross per 24 hour  Intake   3315 ml  Output   1925 ml  Net   1390 ml   Filed Weights   08/09/13 1842  Weight: 117.935 kg (260 lb)    Exam:   General:  Awake, in nad  Cardiovascular: regular, s1, s2  Respiratory: normal resp effort, end-expiratory wheezing  Abdomen: soft, nondistended  Musculoskeletal: perfused, no clubbing   Data Reviewed: Basic Metabolic Panel:  Recent Labs Lab 08/06/13 0908 08/09/13 1550 08/09/13 1845 08/10/13 0648  08/11/13 0406  NA 137 140  --  137 138  K 4.4 4.1  --  4.3 4.1  CL 99 97  --  98 99  CO2 28 29  --  28 28  GLUCOSE 129* 109*  --  141* 163*  BUN 18 27*  --  25* 24*  CREATININE 0.97 1.09 0.96 0.84 0.83  CALCIUM 9.2 9.4  --  9.3 9.1   Liver Function Tests:  Recent Labs Lab 08/06/13 0908 08/09/13 1550 08/09/13 1845 08/10/13 0648  AST 24 33 34 22  ALT 43 36 35 30  ALKPHOS 73 72 66 71  BILITOT 0.4 0.9 0.8 0.3  PROT 7.3 7.3 7.2 7.0  ALBUMIN 3.7 3.4* 3.2* 2.9*   No results found for this basename: LIPASE, AMYLASE,  in the last 168 hours No results found for this basename: AMMONIA,  in the last 168 hours CBC:  Recent Labs Lab 08/06/13 0908 08/09/13 1550 08/09/13 1845 08/10/13 0648 08/11/13 0406  WBC 12.3* 33.3* 37.9* 38.5* 35.8*  NEUTROABS 9.7* 30.0*  --   --   --   HGB 16.9 16.9 16.3 15.6 14.0  HCT 48.9 49.4 47.0 45.5 42.0  MCV 95.1 94.6 93.4 94.4 93.3  PLT 216 227 228 228 259   Cardiac Enzymes:  Recent Labs Lab 08/09/13 1845 08/10/13 0045 08/10/13 0648  TROPONINI <0.30 <0.30 <0.30   BNP (last 3 results)  Recent Labs  08/09/13 1550  PROBNP 222.0*  CBG:  Recent Labs Lab 08/10/13 0822 08/10/13 1132 08/10/13 1627 08/11/13 0733  GLUCAP 126* 219* 124* 136*    Recent Results (from the past 240 hour(s))  MRSA PCR SCREENING     Status: None   Collection Time    08/09/13 10:36 PM      Result Value Range Status   MRSA by PCR NEGATIVE  NEGATIVE Final   Comment:            The GeneXpert MRSA Assay (FDA     approved for NASAL specimens     only), is one component of a     comprehensive MRSA colonization     surveillance program. It is not     intended to diagnose MRSA     infection nor to guide or     monitor treatment for     MRSA infections.     Studies: Dg Chest 2 View  08/09/2013   CLINICAL DATA:  Cough and dyspnea with history of COPD  EXAM: CHEST  2 VIEW  COMPARISON:  PA and lateral chest x-ray dated August 06, 2012  FINDINGS: Since the  previous study there have developed confluent interstitial and alveolar densities in the right mid and lower lung and at the left lung base. There is no pleural effusion. The pulmonary vascularity remains prominent. The cardiac silhouette is top-normal in size. The mediastinum is normal in width.  IMPRESSION: The findings are consistent with bilateral interstitial and alveolar pneumonia superimposed upon underlying changes of COPD. The findings are most conspicuous on the right.   Electronically Signed   By: David  Martinique   On: 08/09/2013 16:15    Scheduled Meds: . azithromycin  500 mg Intravenous Q24H  . cefTRIAXone (ROCEPHIN)  IV  1 g Intravenous Q24H  . docusate sodium  100 mg Oral BID  . enoxaparin (LOVENOX) injection  60 mg Subcutaneous Q24H  . ipratropium  0.5 mg Nebulization Q6H  . levalbuterol  0.63 mg Nebulization Q6H  . methylPREDNISolone sodium succinate  60 mg Intravenous Q8H  . mometasone-formoterol  2 puff Inhalation BID  . nicotine  14 mg Transdermal Daily  . oseltamivir  75 mg Oral BID  . sodium chloride  500 mL Intravenous Once  . sodium chloride  3 mL Intravenous Q12H   Continuous Infusions: . sodium chloride 75 mL/hr at 08/10/13 1821    Active Problems:   CAP (community acquired pneumonia)   Pneumonia   Sepsis  Time spent: 67min  Latisha Lasch, [facility] Pager [phone]. If 7PM-7AM, please contact night-coverage at [email or web address], password [facility] 08/11/2013, 8:54 AM  LOS: 2 days

## 2013-08-12 ENCOUNTER — Inpatient Hospital Stay (HOSPITAL_COMMUNITY): Payer: Medicare Other

## 2013-08-12 DIAGNOSIS — F17200 Nicotine dependence, unspecified, uncomplicated: Secondary | ICD-10-CM

## 2013-08-12 LAB — CBC WITH DIFFERENTIAL/PLATELET
BASOS ABS: 0 10*3/uL (ref 0.0–0.1)
Basophils Relative: 0 % (ref 0–1)
Eosinophils Absolute: 0 10*3/uL (ref 0.0–0.7)
Eosinophils Relative: 0 % (ref 0–5)
HCT: 43.2 % (ref 39.0–52.0)
Hemoglobin: 14.7 g/dL (ref 13.0–17.0)
Lymphocytes Relative: 7 % — ABNORMAL LOW (ref 12–46)
Lymphs Abs: 2.1 10*3/uL (ref 0.7–4.0)
MCH: 32.5 pg (ref 26.0–34.0)
MCHC: 34 g/dL (ref 30.0–36.0)
MCV: 95.6 fL (ref 78.0–100.0)
MONO ABS: 0.9 10*3/uL (ref 0.1–1.0)
MONOS PCT: 3 % (ref 3–12)
NEUTROS PCT: 90 % — AB (ref 43–77)
Neutro Abs: 27.1 10*3/uL — ABNORMAL HIGH (ref 1.7–7.7)
PLATELETS: 295 10*3/uL (ref 150–400)
RBC: 4.52 MIL/uL (ref 4.22–5.81)
RDW: 14.4 % (ref 11.5–15.5)
WBC: 30.1 10*3/uL — AB (ref 4.0–10.5)

## 2013-08-12 LAB — GLUCOSE, CAPILLARY
Glucose-Capillary: 156 mg/dL — ABNORMAL HIGH (ref 70–99)
Glucose-Capillary: 135 mg/dL — ABNORMAL HIGH (ref 70–99)
Glucose-Capillary: 159 mg/dL — ABNORMAL HIGH (ref 70–99)

## 2013-08-12 MED ORDER — CHLORHEXIDINE GLUCONATE 0.12 % MT SOLN
15.0000 mL | Freq: Two times a day (BID) | OROMUCOSAL | Status: DC
  Administered 2013-08-12 – 2013-08-13 (×4): 15 mL via OROMUCOSAL
  Filled 2013-08-12 (×6): qty 15

## 2013-08-12 MED ORDER — BIOTENE DRY MOUTH MT LIQD
15.0000 mL | Freq: Two times a day (BID) | OROMUCOSAL | Status: DC
Start: 2013-08-12 — End: 2013-08-13
  Administered 2013-08-12 (×2): 15 mL via OROMUCOSAL

## 2013-08-12 NOTE — Progress Notes (Signed)
[facility] PROGRESS NOTE  STEELE STRACENER MWN:[id number] DOB: Sep 20, 1950 DOA: 08/09/2013 PCP: Lauraine Rinne, MD  Assessment/Plan: 1. Sepsis with CAP and Influenza 1. Pt with WBC in the mid-30K range and initial HR over 100's with bilateral pneumonia 2. Tolerating abx 3. Flu +, started tamiflu 08/10/13 4. Afebrile 5. Reports continuing to feel better 6. WBC improving 2. Influenza 1. Started tamiflu per above 3. COPD 1. End-expiratory wheezing on exam, but improving 2. On IV steroids (solumedrol 60 q8h), wean as tolerated 3. Cont on q6 xopenex nebs 4. Polycythemia 1. Likely secondary to chronic hypoxia from tobacco abuse 2. Stable 5. Tobacco abuse 1. Reports officially quitting as of now 2. Congratulated 3. Nicotine patch ordered 6. DVT prophylaxis 1. Lovenox  Code Status: Full Family Communication: Pt in room (indicate person spoken with, relationship, and if by phone, the number) Disposition Plan: Transfer to floor today  Antibiotics:  Azithromycin 08/09/13>>>  Ceftriaxone 08/09/13>>>  Tamiflu 08/10/13>>>  HPI/Subjective: No acute events overnight. Reports feeling markedly improved since initial admit.  Objective: Filed Vitals:   08/12/13 0321 08/12/13 0352 08/12/13 0400 08/12/13 0639  BP:  145/85    Pulse: 85     Temp:   97.7 F (36.5 C)   TempSrc:   Oral   Resp: 15 16  15   Height:      Weight:  121.3 kg (267 lb 6.7 oz)    SpO2: 98% 98%  98%    Intake/Output Summary (Last 24 hours) at 08/12/13 0826 Last data filed at 08/12/13 0700  Gross per 24 hour  Intake   3530 ml  Output   1900 ml  Net   1630 ml   Filed Weights   08/09/13 1842 08/12/13 0352  Weight: 117.935 kg (260 lb) 121.3 kg (267 lb 6.7 oz)    Exam:   General:  Awake, in nad  Cardiovascular: regular, s1, s2  Respiratory: normal resp effort, end-expiratory wheezing  Abdomen: soft, nondistended  Musculoskeletal: perfused, no clubbing   Data Reviewed: Basic Metabolic  Panel:  Recent Labs Lab 08/06/13 0908 08/09/13 1550 08/09/13 1845 08/10/13 0648 08/11/13 0406  NA 137 140  --  137 138  K 4.4 4.1  --  4.3 4.1  CL 99 97  --  98 99  CO2 28 29  --  28 28  GLUCOSE 129* 109*  --  141* 163*  BUN 18 27*  --  25* 24*  CREATININE 0.97 1.09 0.96 0.84 0.83  CALCIUM 9.2 9.4  --  9.3 9.1   Liver Function Tests:  Recent Labs Lab 08/06/13 0908 08/09/13 1550 08/09/13 1845 08/10/13 0648  AST 24 33 34 22  ALT 43 36 35 30  ALKPHOS 73 72 66 71  BILITOT 0.4 0.9 0.8 0.3  PROT 7.3 7.3 7.2 7.0  ALBUMIN 3.7 3.4* 3.2* 2.9*   No results found for this basename: LIPASE, AMYLASE,  in the last 168 hours No results found for this basename: AMMONIA,  in the last 168 hours CBC:  Recent Labs Lab 08/06/13 0908 08/09/13 1550 08/09/13 1845 08/10/13 0648 08/11/13 0406 08/12/13 0343  WBC 12.3* 33.3* 37.9* 38.5* 35.8* 30.1*  NEUTROABS 9.7* 30.0*  --   --   --  27.1*  HGB 16.9 16.9 16.3 15.6 14.0 14.7  HCT 48.9 49.4 47.0 45.5 42.0 43.2  MCV 95.1 94.6 93.4 94.4 93.3 95.6  PLT 216 227 228 228 259 295   Cardiac Enzymes:  Recent Labs Lab 08/09/13 1845 08/10/13 0045 08/10/13 4034  TROPONINI <0.30 <0.30 <0.30   BNP (last 3 results)  Recent Labs  08/09/13 1550  PROBNP 222.0*   CBG:  Recent Labs Lab 08/10/13 1627 08/11/13 0733 08/11/13 1232 08/11/13 1738 08/12/13 0613  GLUCAP 124* 136* 152* 165* 156*    Recent Results (from the past 240 hour(s))  MRSA PCR SCREENING     Status: None   Collection Time    08/09/13 10:36 PM      Result Value Range Status   MRSA by PCR NEGATIVE  NEGATIVE Final   Comment:            The GeneXpert MRSA Assay (FDA     approved for NASAL specimens     only), is one component of a     comprehensive MRSA colonization     surveillance program. It is not     intended to diagnose MRSA     infection nor to guide or     monitor treatment for     MRSA infections.     Studies: Dg Chest Port 1 View  08/12/2013    CLINICAL DATA:  COPD exacerbation.  followup pneumonia.  EXAM: PORTABLE CHEST - 1 VIEW  COMPARISON:  Two-view chest x-ray 08/09/2013, 08/06/2013, 03/22/2013, 06/28/2012.  FINDINGS: Emphysematous changes in the upper lobes and chronic interstitial opacity in the mid and lower lungs, likely fibrosis. Superimposed patchy airspace opacities in the right base, improved since the examination 3 days ago. No new pulmonary parenchymal abnormalities. Cardiac silhouette upper normal in size for technique.  IMPRESSION: Improving pneumonia at the right lung base, with only mild residual airspace opacity persisting. No new abnormalities. Baseline COPD and likely interstitial fibrosis.   Electronically Signed   By: Evangeline Dakin M.D.   On: 08/12/2013 07:03    Scheduled Meds: . antiseptic oral rinse  15 mL Mouth Rinse q12n4p  . azithromycin  500 mg Intravenous Q24H  . cefTRIAXone (ROCEPHIN)  IV  1 g Intravenous Q24H  . chlorhexidine  15 mL Mouth Rinse BID  . docusate sodium  100 mg Oral BID  . enoxaparin (LOVENOX) injection  60 mg Subcutaneous Q24H  . ipratropium  0.5 mg Nebulization Q6H  . levalbuterol  0.63 mg Nebulization Q6H  . methylPREDNISolone sodium succinate  60 mg Intravenous Q12H  . mometasone-formoterol  2 puff Inhalation BID  . nicotine  14 mg Transdermal Daily  . oseltamivir  75 mg Oral BID  . sodium chloride  500 mL Intravenous Once  . sodium chloride  3 mL Intravenous Q12H   Continuous Infusions: . sodium chloride 75 mL/hr at 08/11/13 2344    Active Problems:   CAP (community acquired pneumonia)   Pneumonia   Sepsis  Time spent: 66min  Clydine Parkison, [facility] Pager [phone]. If 7PM-7AM, please contact night-coverage at [email or web address], password [facility] 08/12/2013, 8:26 AM  LOS: 3 days

## 2013-08-12 NOTE — Progress Notes (Signed)
Report called to [place], Therapist, sports.  Pending transfer to room 1521 via wheelchair.  Wife at bedside.  Patient alert and oriented.

## 2013-08-12 NOTE — Plan of Care (Signed)
Problem: Phase I Progression Outcomes Goal: OOB as tolerated unless otherwise ordered Outcome: Completed/Met Date Met:  08/12/13 Up to bedside chair.

## 2013-08-13 DIAGNOSIS — J189 Pneumonia, unspecified organism: Secondary | ICD-10-CM

## 2013-08-13 LAB — CBC
HCT: 43.7 % (ref 39.0–52.0)
Hemoglobin: 15.1 g/dL (ref 13.0–17.0)
MCH: 32.3 pg (ref 26.0–34.0)
MCHC: 34.6 g/dL (ref 30.0–36.0)
MCV: 93.4 fL (ref 78.0–100.0)
PLATELETS: 363 10*3/uL (ref 150–400)
RBC: 4.68 MIL/uL (ref 4.22–5.81)
RDW: 14.2 % (ref 11.5–15.5)
WBC: 19 10*3/uL — AB (ref 4.0–10.5)

## 2013-08-13 LAB — GLUCOSE, CAPILLARY
GLUCOSE-CAPILLARY: 129 mg/dL — AB (ref 70–99)
Glucose-Capillary: 123 mg/dL — ABNORMAL HIGH (ref 70–99)

## 2013-08-13 MED ORDER — OSELTAMIVIR PHOSPHATE 75 MG PO CAPS: 75.0000 mg | ORAL_CAPSULE | Freq: Two times a day (BID) | ORAL | Status: AC

## 2013-08-13 MED ORDER — PREDNISONE 5 MG PO TABS: 5.0000 mg | ORAL_TABLET | Freq: Every day | ORAL | Status: DC

## 2013-08-13 MED ORDER — NICOTINE 14 MG/24HR TD PT24: 14.0000 mg | MEDICATED_PATCH | Freq: Every day | TRANSDERMAL | Status: DC

## 2013-08-13 MED ORDER — TIOTROPIUM BROMIDE MONOHYDRATE 18 MCG IN CAPS
18.0000 ug | ORAL_CAPSULE | Freq: Every day | RESPIRATORY_TRACT | Status: DC
  Filled 2013-08-13: qty 5

## 2013-08-13 MED ORDER — AZITHROMYCIN 500 MG PO TABS: 500.0000 mg | ORAL_TABLET | Freq: Every day | ORAL | Status: DC

## 2013-08-13 MED ORDER — CEFPODOXIME PROXETIL 100 MG PO TABS: 100.0000 mg | ORAL_TABLET | Freq: Two times a day (BID) | ORAL | Status: DC

## 2013-08-13 MED ORDER — LEVALBUTEROL HCL 0.63 MG/3ML IN NEBU: 0.6300 mg | INHALATION_SOLUTION | Freq: Four times a day (QID) | RESPIRATORY_TRACT | Status: DC | PRN

## 2013-08-13 NOTE — Discharge Summary (Signed)
Physician Discharge Summary  Allen Green [id number] DOB: [birth year] DOA: 08/09/2013  PCP: Lauraine Rinne, MD  Admit date: 08/09/2013 Discharge date: 08/13/2013  Time spent: 35 minutes  Recommendations for Outpatient Follow-up:  1. Follow up with PCP in 1 week 2. Follow up with Pulmonary in 1-2 weeks  Discharge Diagnoses:  Active Problems:   CAP (community acquired pneumonia)   Pneumonia   Sepsis  Discharge Condition: Improved  Diet recommendation: Heart healthy  Filed Weights   08/09/13 1842 08/12/13 0352 08/12/13 1557  Weight: 117.935 kg (260 lb) 121.3 kg (267 lb 6.7 oz) 122.6 kg (270 lb 4.5 oz)    History of present illness:  63 year old male with a history of advanced COPD, polycythemia followed by Dr. Lamonte Sakai, presents to have a pen ED on 1/40 with myalgias, cough yellow productive, white count of 12,000, chest x-ray that is consistent with acute bronchitis but no pneumonia, discharged on azithromycin and prednisone. Subsequently presents today with worsening blood pressure systolic blood pressure in the 80s upon presentation, patient has also had some nausea no vomiting and diarrhea. He did not get tested for influenza. He is also been lightheaded has had some confusion disorientation. Denies any chest pain  WBC count of 33,000, elevated lactic acid,  According to pulmonology, the patient continues to smoke 2 to a half pack per day. He is home oxygen dependent on 2 L.[name]  Hospital Course:  1. Sepsis with CAP and Influenza  1. Pt with WBC in the mid-30K range and initial HR over 100's with bilateral pneumonia 2. Tolerating abx 3. Flu +, started tamiflu 08/10/13 (anticipated end date 08/15/13) 4. Remained afebrile through this hospital course 5. Reports continuing to feel better 6. WBC markedly improved by the day of discharge with WBC of 19K 7. Will convert to PO abx on discharge to complete total of 10 days of antibiotics 2. Influenza  1. Started tamiflu per above 3. COPD   1. End-expiratory wheezing on exam, but improving, suspect this patient has chronic wheezing given his extensive past smoking history 2. Patient reports breathing close to his baseline 3. O2 requirements back to baseline on day of discharge 4. Initially started on IV steroids with plans to wean with prednisone as an outpatient 5. Patient was cont on q6 xopenex nebs 4. Polycythemia  1. Likely secondary to chronic hypoxia from tobacco abuse 2. Stable 5. Tobacco abuse  1. Reports officially quitting as of now 2. Congratulated 3. Nicotine patch ordered 6. DVT prophylaxis  1. Was continued on Lovenox  Discharge Exam: Filed Vitals:   08/12/13 1943 08/12/13 2130 08/13/13 0536 08/13/13 0802  BP:  144/87 148/75   Pulse:  91 76   Temp:  97.6 F (36.4 C) 98.1 F (36.7 C)   TempSrc:  Oral Oral   Resp:  18 18   Height:      Weight:      SpO2: 95% 94% 94% 93%    General: Awake, in nad Cardiovascular: regular, s1, s2 Respiratory: normal resp effort, end-expiratory wheezing bilaterally, better air movement  Discharge Instructions     Medication List    STOP taking these medications       predniSONE 10 MG tablet  Commonly known as:  DELTASONE      TAKE these medications       acetaminophen 500 MG tablet  Commonly known as:  TYLENOL  Take 500 mg by mouth every 6 (six) hours as needed for moderate pain or fever.  albuterol 108 (90 BASE) MCG/ACT inhaler  Commonly known as:  PROVENTIL HFA;VENTOLIN HFA  Inhale 2 puffs into the lungs every 4 (four) hours as needed for wheezing or shortness of breath.     azithromycin 500 MG tablet  Commonly known as:  ZITHROMAX  Take 1 tablet (500 mg total) by mouth daily.     benzonatate 200 MG capsule  Commonly known as:  TESSALON  Take 1 capsule (200 mg total) by mouth 3 (three) times daily as needed for cough.     cefpodoxime 100 MG tablet  Commonly known as:  VANTIN  Take 1 tablet (100 mg total) by mouth 2 (two) times daily.      fluticasone 50 MCG/ACT nasal spray  Commonly known as:  FLONASE  Place 2 sprays into the nose 2 (two) times daily.     mometasone-formoterol 200-5 MCG/ACT Aero  Commonly known as:  DULERA  Inhale 2 puffs into the lungs 2 (two) times daily.     nicotine 14 mg/24hr patch  Commonly known as:  [employer] NICOTINE TRANSDERMAL SYS  Place 1 patch (14 mg total) onto the skin daily.     oseltamivir 75 MG capsule  Commonly known as:  TAMIFLU  Take 1 capsule (75 mg total) by mouth 2 (two) times daily.     tiotropium 18 MCG inhalation capsule  Commonly known as:  SPIRIVA HANDIHALER  Place 1 capsule (18 mcg total) into inhaler and inhale daily.       No Known Allergies    The results of significant diagnostics from this hospitalization (including imaging, microbiology, ancillary and laboratory) are listed below for reference.    Significant Diagnostic Studies: Dg Chest 2 View  08/09/2013   CLINICAL DATA:  Cough and dyspnea with history of COPD  EXAM: CHEST  2 VIEW  COMPARISON:  PA and lateral chest x-ray dated August 06, 2012  FINDINGS: Since the previous study there have developed confluent interstitial and alveolar densities in the right mid and lower lung and at the left lung base. There is no pleural effusion. The pulmonary vascularity remains prominent. The cardiac silhouette is top-normal in size. The mediastinum is normal in width.  IMPRESSION: The findings are consistent with bilateral interstitial and alveolar pneumonia superimposed upon underlying changes of COPD. The findings are most conspicuous on the right.   Electronically Signed   By: David  Martinique   On: 08/09/2013 16:15   Dg Chest 2 View  08/06/2013   CLINICAL DATA:  Cough and congestion.  EXAM: CHEST  2 VIEW  COMPARISON:  03/22/2013  FINDINGS: Again noted are enlarged central vascular structures with coarse interstitial lung markings. These lung findings have not significantly changed. Heart size is within normal limits and stable. No  evidence for pleural effusions. Bony thorax is intact.  IMPRESSION: Enlarged central vascular structures and prominent interstitial lung markings appear to be chronic. Findings could represent chronic pulmonary hypertension. No focal airspace disease.   Electronically Signed   By: Markus Daft M.D.   On: 08/06/2013 11:05   Dg Chest Port 1 View  08/12/2013   CLINICAL DATA:  COPD exacerbation.  followup pneumonia.  EXAM: PORTABLE CHEST - 1 VIEW  COMPARISON:  Two-view chest x-ray 08/09/2013, 08/06/2013, 03/22/2013, 06/28/2012.  FINDINGS: Emphysematous changes in the upper lobes and chronic interstitial opacity in the mid and lower lungs, likely fibrosis. Superimposed patchy airspace opacities in the right base, improved since the examination 3 days ago. No new pulmonary parenchymal abnormalities. Cardiac silhouette upper normal  in size for technique.  IMPRESSION: Improving pneumonia at the right lung base, with only mild residual airspace opacity persisting. No new abnormalities. Baseline COPD and likely interstitial fibrosis.   Electronically Signed   By: Evangeline Dakin M.D.   On: 08/12/2013 07:03    Microbiology: Recent Results (from the past 240 hour(s))  MRSA PCR SCREENING     Status: None   Collection Time    08/09/13 10:36 PM      Result Value Range Status   MRSA by PCR NEGATIVE  NEGATIVE Final   Comment:            The GeneXpert MRSA Assay (FDA     approved for NASAL specimens     only), is one component of a     comprehensive MRSA colonization     surveillance program. It is not     intended to diagnose MRSA     infection nor to guide or     monitor treatment for     MRSA infections.     Labs: Basic Metabolic Panel:  Recent Labs Lab 08/09/13 1550 08/09/13 1845 08/10/13 0648 08/11/13 0406  NA 140  --  137 138  K 4.1  --  4.3 4.1  CL 97  --  98 99  CO2 29  --  28 28  GLUCOSE 109*  --  141* 163*  BUN 27*  --  25* 24*  CREATININE 1.09 0.96 0.84 0.83  CALCIUM 9.4  --  9.3 9.1    Liver Function Tests:  Recent Labs Lab 08/09/13 1550 08/09/13 1845 08/10/13 0648  AST 33 34 22  ALT 36 35 30  ALKPHOS 72 66 71  BILITOT 0.9 0.8 0.3  PROT 7.3 7.2 7.0  ALBUMIN 3.4* 3.2* 2.9*   No results found for this basename: LIPASE, AMYLASE,  in the last 168 hours No results found for this basename: AMMONIA,  in the last 168 hours CBC:  Recent Labs Lab 08/09/13 1550 08/09/13 1845 08/10/13 0648 08/11/13 0406 08/12/13 0343 08/13/13 0805  WBC 33.3* 37.9* 38.5* 35.8* 30.1* 19.0*  NEUTROABS 30.0*  --   --   --  27.1*  --   HGB 16.9 16.3 15.6 14.0 14.7 15.1  HCT 49.4 47.0 45.5 42.0 43.2 43.7  MCV 94.6 93.4 94.4 93.3 95.6 93.4  PLT 227 228 228 259 295 363   Cardiac Enzymes:  Recent Labs Lab 08/09/13 1845 08/10/13 0045 08/10/13 0648  TROPONINI <0.30 <0.30 <0.30   BNP: BNP (last 3 results)  Recent Labs  08/09/13 1550  PROBNP 222.0*   CBG:  Recent Labs Lab 08/11/13 1738 08/12/13 0613 08/12/13 1201 08/12/13 1618 08/13/13 0713  GLUCAP 165* 156* 135* 159* 123*    Signed:  Elyssia Strausser K  [facility] 08/13/2013, 10:34 AM

## 2013-08-13 NOTE — Progress Notes (Signed)
Discharge instructions and prescriptions explained to pt and his wife. They state understanding the d/c instructions. Five prescriptions given to the wife. Pt's  O2 sats 96% on 2L of O2  [place]ia [place].[id number] @0730 .

## 2013-08-14 ENCOUNTER — Telehealth: Payer: Self-pay | Admitting: Emergency Medicine

## 2013-08-14 NOTE — Telephone Encounter (Signed)
Advised pt RB did not have appt avail.  Scheduled pt with TP 08/23/13 @9 :00.  Pt aware and nothing further is needed

## 2013-08-23 ENCOUNTER — Inpatient Hospital Stay: Payer: Medicare Other | Admitting: Adult Health

## 2013-08-24 ENCOUNTER — Inpatient Hospital Stay: Payer: Medicare Other | Admitting: Adult Health

## 2013-08-28 ENCOUNTER — Ambulatory Visit (INDEPENDENT_AMBULATORY_CARE_PROVIDER_SITE_OTHER)
Admission: RE | Admit: 2013-08-28 | Discharge: 2013-08-28 | Disposition: A | Discharge status: Home / Self Care | Payer: Medicare Other | Source: Ambulatory Visit | Attending: Adult Health | Admitting: Adult Health

## 2013-08-28 ENCOUNTER — Ambulatory Visit (INDEPENDENT_AMBULATORY_CARE_PROVIDER_SITE_OTHER): Payer: Medicare Other | Admitting: Adult Health

## 2013-08-28 ENCOUNTER — Encounter: Payer: Self-pay | Admitting: Adult Health

## 2013-08-28 VITALS — BP 112/68 | HR 98 | Temp 98.2°F | Ht 71.0 in | Wt 268.2 lb

## 2013-08-28 DIAGNOSIS — J189 Pneumonia, unspecified organism: Secondary | ICD-10-CM

## 2013-08-28 MED ORDER — BENZONATATE 200 MG PO CAPS: 200.0000 mg | ORAL_CAPSULE | Freq: Three times a day (TID) | ORAL | Status: DC | PRN

## 2013-08-28 NOTE — Assessment & Plan Note (Addendum)
Recent PNA with influenza. Slow to resolve  cxr today improved with resolved PNA  >requiring extended abx and steroids  Must quit smoking   Plan  Finish Levaquin and steroids as directed.  Mucinex DM Twice daily  As needed  Cough/congestion  Fluids and rest  Tessalon Three times a day  As needed  Cough.  Follow up Dr. Lamonte Sakai  4 weeks and As needed   Smoking cessation  Please contact office for sooner follow up if symptoms do not improve or worsen or seek emergency care

## 2013-08-28 NOTE — Patient Instructions (Signed)
Finish Levaquin and steroids as directed.  Mucinex DM Twice daily  As needed  Cough/congestion  Fluids and rest  Tessalon Three times a day  As needed  Cough.  Follow up Dr. Lamonte Sakai  4 weeks and As needed   Please contact office for sooner follow up if symptoms do not improve or worsen or seek emergency care

## 2013-08-28 NOTE — Progress Notes (Signed)
Subjective:    Patient ID: Allen Green, male    DOB: 02-Jul-1951, 63 y.o.   MRN: [id number] HPI 63 yo man with long tobacco hx, still smoking 2 pks a day. He has hx polycythemia, ? Etiology. Also OA with knee, shoulder and back pain. He is referred for severe exertional SOB. Has been progressive over several years. He has daily cough, some white phlegm. He hears wheezing  - often when exerting or when supine. He has had episodes of what sound like AE-COPD +/- pneumonia, never hospitalized but has had Prednisone and abx before. He has been on Spiriva before - he got it from a friend! Has also been rx with albuterol. He is interested in stopping smoking. He is being evaluated by Dr Birdie Hopes for possible re-do L knee surgery. He is planning to undergo cardiac stress testing under direction of Dr Scherrie Bateman 01/07/11 -- follows up for tobacco, COPD, hypoxemia. He tells me that he has cut down cigs significantly - has smoked 1 pk total in 4 days. He is scheduled to get cardiac testing on 01/14/11 with Dr Domenic Polite. Last time we started oxygen at 2L/min - he feels that it has helped his exertional tolerance. We started Spiriva x 10 days and he though it probably helped some, doesn't really miss it now that sample has run out. Doesn't have a SABA.   ROV 04/14/11 -- follows up for tobacco, COPD, hypoxemia. He tells me that he has been inconsistent with the Spiriva but that he does it most days. He can tell that it is helping him. He is wearing his oxygen to sleep, wears at home but doesn';t always wear it when he is out, but he knows he misses it. He still has significant exertional dyspnea. He is back up to a pack a day of cigarettes. He wants to quit, but doesn't have a plan.   ROV 06/22/11 -- 15 yom, follows up for tobacco, COPD, hypoxemia. Still smoking 1 pk/day. Pretty good about wearing his O2 set on 2L/min, but noticed some dry throat. He has had some URI sx last 2 weeks, more cough with white mucous. Last time we  added Symbicort to Spiriva - ? Whether the symbicort is causing throat irritation. He does feel that the breathing is better. He wants to quit smoking - we will make New Years Day our target.   ROV 07/24/11 -- COPD, CAD and hypoxemia, tobacco use.  He has been having more cough, more sputum - yellow/brown. No blood. Breathing is stable. Looking to have leg surgery at [facility]. Stable on Symbicort and Spiriva. O2 on 2L/min - 3L/min.   ROV 06/23/12 -- COPD, CAD and hypoxemia, heavy tobacco use. He quit tobacco 1 month ago and is using e-cig. Presents for annual f/u, but reports more cough, congestion, some blood-tinged last 3 days, ? Fever, muscle aches. He is using spiriva and symbicort.    ROV 08/18/12 -- f/u for COPD, chronic bronchitis. He started the e-cig, allowed him to stop cigarettes x 5 weeks, then back to 2-3 a day; now no cigarettes x 1 week. He is on chronic O2. He is planning for L knee sgy, says that anesthesiologist can do it under spinal and conscious sedation.  No flares since last time. He is off prednisone. Still on Spiriva + Symbicort.    PULMONARY FUNCTON TEST 01/07/2011  FVC 4.39  FEV1 1.61  FEV1/FVC 36.7  FVC  % Predicted 92  FEV % Predicted 48  FeF 25-75 .39  FeF 25-75 % Predicted 3.2    12/05/12 -- Acute OV  Complains of increased SOB, wheezing, chest tightness, prod cough with clear mucus x4-5days - denies f/c/s.  CAT = 27.  No fever, discolored mucus, edema orthopnea, chest pain, or n/v.  Has restarted smoking, discussed smoking cessation.  NO OTC used for symptoms No increased edema.  No recent ER or hospital visit.   12/16/12 -- f/u for COPD, chronic bronchitis, smoker (1/2 pk a day). Currently on symbicort, spiriva, saba prn. He is having head congestion, clear mucous, cough productive clear. Using albuterol -3 x a day. He was unable to take the prior pred taper given by TP - he spilled coffee in the bottle  03/23/13 -- f/u for COPD, chronic bronchitis, smoker  (has cut down to 3 cig a day, using the vaporizer). For the last week has had more nasal congestion, more chest congestion, cough productive of white mucous. No fever, HA. Has been stuck in the bed all week.   ROV acute 03/29/13 -- f/u for COPD, chronic bronchitis, smoker. Treated with pred taper + azithro a week ago for AE in setting URI. He had a CXR at PCP >> COPD, no infiltrates. He has continued to have nasal congestion, cough. Turns out he wasn't smoking 3 cig a day, he admits now that he was smoking 3 packs a day. His Symbicort is no longer to be covered by his insurance. Will need a substitute. CXR reviewed from 8/20 > showed hyperinflation, chronic bronchitis changes, mild interstitial prominence.  >no changes    04/11/2013  Follow up  3 week follow up - reports breathing is doing well.  no new complaints.[name]  CAT Score is 20 .  Remains off cigarettes.  No flare in cough , wheezing,  Feels his dyspnea is better esp at night since stopping smoking .  Insurance will no longer cover Symbicort.  No fever, hemoptysis, chest pain or orthopnea.    08/28/2013 [facility] follow up  Patient returns for a post hospital followup. Patient was admitted January 7 through 08/13/2013 for community-acquired pneumonia complicated by influenza and sepsis he was treated with aggressive IV antibiotics, nebulized bronchodilators, and Tamiflu and steroids. Patient was discharged on antibiotics, to complete a full course, along with a steroid taper. Since discharge has been slow to improve. Went to PCP on 1/24 for dyspnea and was rx'd Levaquin 750mg  x6days and pred taper - taken 30mg  today..  Says that breathing got worse 2 days ago. Cough is improved , minimally productive, mainly clear mucus. No fever. No hemopytis, or edema.  CXR today improved     ROS Neg except HPI    Objective:   Physical Exam  Gen: Pleasant, obese, in no distress,  normal affect  ENT: No lesions,  mouth clear,  oropharynx clear,  no postnasal drip,   Neck: No JVD, no TMG, no carotid bruits  Lungs: No use of accessory muscles, no wheezing  , dimiinshed BS in bases   Cardiovascular: RRR, heart sounds normal, no murmur or gallops, no peripheral edema  Musculoskeletal: No deformities, no cyanosis or clubbing  Neuro: alert, non focal  Skin: Warm, no lesions or rashes   CXR 08/28/13 >No acute infiltrate or pulmonary edema. Mild residual atelectasis or scarring right base

## 2013-08-29 NOTE — Progress Notes (Signed)
Quick Note:  Called spoke with patient, advised of cxr results / recs as stated by TP. Pt verbalized his understanding and denied any questions. ______ 

## 2013-09-25 ENCOUNTER — Ambulatory Visit (INDEPENDENT_AMBULATORY_CARE_PROVIDER_SITE_OTHER): Payer: Medicare Other | Admitting: Emergency Medicine

## 2013-09-25 ENCOUNTER — Encounter: Payer: Self-pay | Admitting: Emergency Medicine

## 2013-09-25 VITALS — BP 122/88 | HR 89 | Ht 70.0 in | Wt 267.0 lb

## 2013-09-25 DIAGNOSIS — Z720 Tobacco use: Secondary | ICD-10-CM

## 2013-09-25 DIAGNOSIS — J449 Chronic obstructive pulmonary disease, unspecified: Secondary | ICD-10-CM

## 2013-09-25 DIAGNOSIS — F17200 Nicotine dependence, unspecified, uncomplicated: Secondary | ICD-10-CM

## 2013-09-25 NOTE — Assessment & Plan Note (Signed)
Continue same regimen. Discussed his smoking in detail. We set a goal to be down to 1 pk/day by next visit in 3 months

## 2013-09-25 NOTE — Patient Instructions (Signed)
We have agreed that you will decrease your cigarettes to 1 pack a day as soon as possible and stay there until next visit.  Please continue your spiriva, dulera and oxygen as you are taking them If your cough worsens then go back to using the fluticasone nasal spray every day.  Follow with Dr Lamonte Sakai in 3 months or sooner if you have any problems.

## 2013-09-25 NOTE — Progress Notes (Signed)
Subjective:    Patient ID: Allen Green, male    DOB: [birth year], 63 y.o.   MRN: [id number] HPI 63 yo man with long tobacco hx, still smoking 2 pks a day. He has hx polycythemia, ? Etiology. Also OA with knee, shoulder and back pain. He is referred for severe exertional SOB. Has been progressive over several years. He has daily cough, some white phlegm. He hears wheezing  - often when exerting or when supine. He has had episodes of what sound like AE-COPD +/- pneumonia, never hospitalized but has had Prednisone and abx before. He has been on Spiriva before - he got it from a friend! Has also been rx with albuterol. He is interested in stopping smoking. He is being evaluated by Dr Birdie Hopes for possible re-do L knee surgery. He is planning to undergo cardiac stress testing under direction of Dr Scherrie Bateman 01/07/11 -- follows up for tobacco, COPD, hypoxemia. He tells me that he has cut down cigs significantly - has smoked 1 pk total in 4 days. He is scheduled to get cardiac testing on 01/14/11 with Dr Domenic Polite. Last time we started oxygen at 2L/min - he feels that it has helped his exertional tolerance. We started Spiriva x 10 days and he though it probably helped some, doesn't really miss it now that sample has run out. Doesn't have a SABA.   ROV 04/14/11 -- follows up for tobacco, COPD, hypoxemia. He tells me that he has been inconsistent with the Spiriva but that he does it most days. He can tell that it is helping him. He is wearing his oxygen to sleep, wears at home but doesn';t always wear it when he is out, but he knows he misses it. He still has significant exertional dyspnea. He is back up to a pack a day of cigarettes. He wants to quit, but doesn't have a plan.   ROV 06/22/11 -- 63 yom, follows up for tobacco, COPD, hypoxemia. Still smoking 1 pk/day. Pretty good about wearing his O2 set on 2L/min, but noticed some dry throat. He has had some URI sx last 2 weeks, more cough with white mucous. Last time we  added Symbicort to Spiriva - ? Whether the symbicort is causing throat irritation. He does feel that the breathing is better. He wants to quit smoking - we will make New Years Day our target.   ROV 07/24/11 -- COPD, CAD and hypoxemia, tobacco use.  He has been having more cough, more sputum - yellow/brown. No blood. Breathing is stable. Looking to have leg surgery at [facility]. Stable on Symbicort and Spiriva. O2 on 2L/min - 3L/min.   ROV 06/23/12 -- COPD, CAD and hypoxemia, heavy tobacco use. He quit tobacco 1 month ago and is using e-cig. Presents for annual f/u, but reports more cough, congestion, some blood-tinged last 3 days, ? Fever, muscle aches. He is using spiriva and symbicort.    ROV 08/18/12 -- f/u for COPD, chronic bronchitis. He started the e-cig, allowed him to stop cigarettes x 5 weeks, then back to 2-3 a day; now no cigarettes x 1 week. He is on chronic O2. He is planning for L knee sgy, says that anesthesiologist can do it under spinal and conscious sedation.  No flares since last time. He is off prednisone. Still on Spiriva + Symbicort.    PULMONARY FUNCTON TEST 01/07/2011  FVC 4.39  FEV1 1.61  FEV1/FVC 36.7  FVC  % Predicted 92  FEV % Predicted 48  FeF 25-75 .39  FeF 25-75 % Predicted 3.2    12/05/12 -- Acute OV  Complains of increased SOB, wheezing, chest tightness, prod cough with clear mucus x4-5days - denies f/c/s.  CAT = 27.  No fever, discolored mucus, edema orthopnea, chest pain, or n/v.  Has restarted smoking, discussed smoking cessation.  NO OTC used for symptoms No increased edema.  No recent ER or hospital visit.   12/16/12 -- f/u for COPD, chronic bronchitis, smoker (1/2 pk a day). Currently on symbicort, spiriva, saba prn. He is having head congestion, clear mucous, cough productive clear. Using albuterol -3 x a day. He was unable to take the prior pred taper given by TP - he spilled coffee in the bottle  03/23/13 -- f/u for COPD, chronic bronchitis, smoker  (has cut down to 3 cig a day, using the vaporizer). For the last week has had more nasal congestion, more chest congestion, cough productive of white mucous. No fever, HA. Has been stuck in the bed all week.   ROV acute 03/29/13 -- f/u for COPD, chronic bronchitis, smoker. Treated with pred taper + azithro a week ago for AE in setting URI. He had a CXR at PCP >> COPD, no infiltrates. He has continued to have nasal congestion, cough. Turns out he wasn't smoking 3 cig a day, he admits now that he was smoking 3 packs a day. His Symbicort is no longer to be covered by his insurance. Will need a substitute. CXR reviewed from 8/20 > showed hyperinflation, chronic bronchitis changes, mild interstitial prominence.  >no changes    04/11/2013  Follow up  3 week follow up - reports breathing is doing well.  no new complaints.[name]  CAT Score is 20 .  Remains off cigarettes.  No flare in cough , wheezing,  Feels his dyspnea is better esp at night since stopping smoking .  Insurance will no longer cover Symbicort.  No fever, hemoptysis, chest pain or orthopnea.    [facility] follow up  Patient returns for a post hospital followup. Patient was admitted January 7 through 08/13/2013 for community-acquired pneumonia complicated by influenza and sepsis he was treated with aggressive IV antibiotics, nebulized bronchodilators, and Tamiflu and steroids. Patient was discharged on antibiotics, to complete a full course, along with a steroid taper. Since discharge has been slow to improve. Went to PCP on 1/24 for dyspnea and was rx'd Levaquin 750mg  x6days and pred taper - taken 30mg  today..  Says that breathing got worse 2 days ago. Cough is improved , minimally productive, mainly clear mucus. No fever. No hemopytis, or edema.  CXR today improved   ROV 09/25/13 -- Hx obesity, severe COPD, chronic bronchitis, smoker. He was admitted for CAP as above. He feels back to his baseline. He is limited by his leg pain and his  breathing. He is wearing 2-3 L/min. He has daily cough, sometimes gets food hung in his throat and makes him cough. Remains on Dulera and Spiriva. Uses fluticasone qd prn. He is smoking 2 pk a day. Discussed cessation today.     ROS Neg except HPI    Objective:   Physical Exam Filed Vitals:   09/25/13 1412  BP: 122/88  Pulse: 89  Height: 5\' 10"  (1.778 m)  Weight: 267 lb (121.11 kg)  SpO2: 97%   Gen: Pleasant, obese, in no distress,  normal affect  ENT: No lesions,  mouth clear,  oropharynx clear, no postnasal drip,   Neck: No JVD, no TMG, no  carotid bruits  Lungs: No use of accessory muscles, no wheezing  , dimiinshed BS in bases   Cardiovascular: RRR, heart sounds normal, no murmur or gallops, no peripheral edema  Musculoskeletal: No deformities, no cyanosis or clubbing  Neuro: alert, non focal  Skin: Warm, no lesions or rashes   CXR 08/28/13 >No acute infiltrate or pulmonary edema. Mild residual atelectasis or scarring right base  COPD (chronic obstructive pulmonary disease) Continue same regimen. Discussed his smoking in detail. We set a goal to be down to 1 pk/day by next visit in 3 months

## 2013-10-13 ENCOUNTER — Other Ambulatory Visit: Payer: Self-pay | Admitting: Emergency Medicine

## 2013-10-25 ENCOUNTER — Other Ambulatory Visit: Payer: Self-pay | Admitting: Adult Health

## 2013-10-25 MED ORDER — MOMETASONE FURO-FORMOTEROL FUM 200-5 MCG/ACT IN AERO: 2.0000 | INHALATION_SPRAY | Freq: Two times a day (BID) | RESPIRATORY_TRACT | Status: DC

## 2013-10-25 NOTE — Telephone Encounter (Signed)
Received fax from [place] requesting pt's Allen Green be changed to 20 ay supply for insurance purposes Last ov 2.23.15 w/ RB Dulera last refilled 9.9.14 by TP Rx sent for 90day supply with 3 refills Med list updated

## 2013-11-02 ENCOUNTER — Encounter (HOSPITAL_COMMUNITY): Payer: Self-pay | Admitting: Emergency Medicine

## 2013-11-02 ENCOUNTER — Inpatient Hospital Stay (HOSPITAL_COMMUNITY)
Admission: EM | Admit: 2013-11-02 | Discharge: 2013-11-05 | DRG: 445 | Disposition: A | Discharge status: Home / Self Care | Payer: Medicare Other | Attending: Internal Medicine | Admitting: Internal Medicine

## 2013-11-02 ENCOUNTER — Inpatient Hospital Stay (HOSPITAL_COMMUNITY): Payer: Medicare Other

## 2013-11-02 ENCOUNTER — Emergency Department (HOSPITAL_COMMUNITY): Payer: Medicare Other

## 2013-11-02 DIAGNOSIS — J441 Chronic obstructive pulmonary disease with (acute) exacerbation: Secondary | ICD-10-CM | POA: Diagnosis present

## 2013-11-02 DIAGNOSIS — D45 Polycythemia vera: Secondary | ICD-10-CM | POA: Diagnosis present

## 2013-11-02 DIAGNOSIS — R110 Nausea: Secondary | ICD-10-CM | POA: Diagnosis present

## 2013-11-02 DIAGNOSIS — J189 Pneumonia, unspecified organism: Secondary | ICD-10-CM

## 2013-11-02 DIAGNOSIS — F17200 Nicotine dependence, unspecified, uncomplicated: Secondary | ICD-10-CM | POA: Diagnosis present

## 2013-11-02 DIAGNOSIS — R5381 Other malaise: Secondary | ICD-10-CM | POA: Diagnosis present

## 2013-11-02 DIAGNOSIS — R0902 Hypoxemia: Secondary | ICD-10-CM | POA: Diagnosis present

## 2013-11-02 DIAGNOSIS — R109 Unspecified abdominal pain: Secondary | ICD-10-CM | POA: Diagnosis present

## 2013-11-02 DIAGNOSIS — E669 Obesity, unspecified: Secondary | ICD-10-CM | POA: Diagnosis present

## 2013-11-02 DIAGNOSIS — Z720 Tobacco use: Secondary | ICD-10-CM

## 2013-11-02 DIAGNOSIS — Z833 Family history of diabetes mellitus: Secondary | ICD-10-CM

## 2013-11-02 DIAGNOSIS — K8000 Calculus of gallbladder with acute cholecystitis without obstruction: Principal | ICD-10-CM | POA: Diagnosis present

## 2013-11-02 DIAGNOSIS — D72829 Elevated white blood cell count, unspecified: Secondary | ICD-10-CM | POA: Diagnosis present

## 2013-11-02 DIAGNOSIS — Z79899 Other long term (current) drug therapy: Secondary | ICD-10-CM

## 2013-11-02 DIAGNOSIS — Z6837 Body mass index (BMI) 37.0-37.9, adult: Secondary | ICD-10-CM

## 2013-11-02 DIAGNOSIS — J449 Chronic obstructive pulmonary disease, unspecified: Secondary | ICD-10-CM | POA: Diagnosis present

## 2013-11-02 DIAGNOSIS — I509 Heart failure, unspecified: Secondary | ICD-10-CM | POA: Diagnosis present

## 2013-11-02 DIAGNOSIS — D751 Secondary polycythemia: Secondary | ICD-10-CM | POA: Diagnosis present

## 2013-11-02 DIAGNOSIS — R9431 Abnormal electrocardiogram [ECG] [EKG]: Secondary | ICD-10-CM

## 2013-11-02 DIAGNOSIS — Z9981 Dependence on supplemental oxygen: Secondary | ICD-10-CM

## 2013-11-02 DIAGNOSIS — Z96659 Presence of unspecified artificial knee joint: Secondary | ICD-10-CM

## 2013-11-02 DIAGNOSIS — A419 Sepsis, unspecified organism: Secondary | ICD-10-CM

## 2013-11-02 DIAGNOSIS — K8020 Calculus of gallbladder without cholecystitis without obstruction: Secondary | ICD-10-CM

## 2013-11-02 DIAGNOSIS — G4733 Obstructive sleep apnea (adult) (pediatric): Secondary | ICD-10-CM | POA: Diagnosis present

## 2013-11-02 DIAGNOSIS — Z01818 Encounter for other preprocedural examination: Secondary | ICD-10-CM

## 2013-11-02 DIAGNOSIS — Z8249 Family history of ischemic heart disease and other diseases of the circulatory system: Secondary | ICD-10-CM

## 2013-11-02 DIAGNOSIS — J9610 Chronic respiratory failure, unspecified whether with hypoxia or hypercapnia: Secondary | ICD-10-CM | POA: Diagnosis present

## 2013-11-02 DIAGNOSIS — R5383 Other fatigue: Secondary | ICD-10-CM

## 2013-11-02 DIAGNOSIS — K219 Gastro-esophageal reflux disease without esophagitis: Secondary | ICD-10-CM | POA: Diagnosis present

## 2013-11-02 DIAGNOSIS — K810 Acute cholecystitis: Secondary | ICD-10-CM

## 2013-11-02 DIAGNOSIS — G8929 Other chronic pain: Secondary | ICD-10-CM | POA: Diagnosis present

## 2013-11-02 DIAGNOSIS — J309 Allergic rhinitis, unspecified: Secondary | ICD-10-CM

## 2013-11-02 LAB — CBC WITH DIFFERENTIAL/PLATELET
BASOS ABS: 0.1 10*3/uL (ref 0.0–0.1)
BASOS PCT: 1 % (ref 0–1)
EOS ABS: 0.5 10*3/uL (ref 0.0–0.7)
EOS PCT: 4 % (ref 0–5)
HEMATOCRIT: 43.3 % (ref 39.0–52.0)
HEMOGLOBIN: 14.5 g/dL (ref 13.0–17.0)
Lymphocytes Relative: 22 % (ref 12–46)
Lymphs Abs: 2.7 10*3/uL (ref 0.7–4.0)
MCH: 31 pg (ref 26.0–34.0)
MCHC: 33.5 g/dL (ref 30.0–36.0)
MCV: 92.7 fL (ref 78.0–100.0)
MONOS PCT: 8 % (ref 3–12)
Monocytes Absolute: 1 10*3/uL (ref 0.1–1.0)
NEUTROS ABS: 8.3 10*3/uL — AB (ref 1.7–7.7)
Neutrophils Relative %: 66 % (ref 43–77)
Platelets: 316 10*3/uL (ref 150–400)
RBC: 4.67 MIL/uL (ref 4.22–5.81)
RDW: 14.2 % (ref 11.5–15.5)
WBC: 12.5 10*3/uL — ABNORMAL HIGH (ref 4.0–10.5)

## 2013-11-02 LAB — COMPREHENSIVE METABOLIC PANEL
ALBUMIN: 3.8 g/dL (ref 3.5–5.2)
ALK PHOS: 76 U/L (ref 39–117)
ALT: 21 U/L (ref 0–53)
ALT: 20 U/L (ref 0–53)
AST: 18 U/L (ref 0–37)
AST: 18 U/L (ref 0–37)
Albumin: 3.6 g/dL (ref 3.5–5.2)
Alkaline Phosphatase: 90 U/L (ref 39–117)
BUN: 22 mg/dL (ref 6–23)
BUN: 17 mg/dL (ref 6–23)
CALCIUM: 9.1 mg/dL (ref 8.4–10.5)
CO2: 25 mEq/L (ref 19–32)
CO2: 26 mEq/L (ref 19–32)
CREATININE: 0.91 mg/dL (ref 0.50–1.35)
Calcium: 9.4 mg/dL (ref 8.4–10.5)
Chloride: 99 mEq/L (ref 96–112)
Chloride: 98 mEq/L (ref 96–112)
Creatinine, Ser: 0.85 mg/dL (ref 0.50–1.35)
GFR calc Af Amer: >90 mL/min (ref >90)
GFR calc non Af Amer: 89 mL/min — ABNORMAL LOW (ref >90)
GFR calc non Af Amer: >90 mL/min (ref >90)
Glucose, Bld: 118 mg/dL — ABNORMAL HIGH (ref 70–99)
Glucose, Bld: 114 mg/dL — ABNORMAL HIGH (ref 70–99)
POTASSIUM: 4.2 meq/L (ref 3.7–5.3)
Potassium: 4.2 mEq/L (ref 3.7–5.3)
SODIUM: 134 meq/L — AB (ref 137–147)
Sodium: 136 mEq/L — ABNORMAL LOW (ref 137–147)
TOTAL PROTEIN: 7 g/dL (ref 6.0–8.3)
Total Bilirubin: 0.4 mg/dL (ref 0.3–1.2)
Total Bilirubin: 0.5 mg/dL (ref 0.3–1.2)
Total Protein: 7.9 g/dL (ref 6.0–8.3)

## 2013-11-02 LAB — CBC
HCT: 47 % (ref 39.0–52.0)
Hemoglobin: 16.5 g/dL (ref 13.0–17.0)
MCH: 32.8 pg (ref 26.0–34.0)
MCHC: 35.1 g/dL (ref 30.0–36.0)
MCV: 93.4 fL (ref 78.0–100.0)
PLATELETS: 322 10*3/uL (ref 150–400)
RBC: 5.03 MIL/uL (ref 4.22–5.81)
RDW: 14.2 % (ref 11.5–15.5)
WBC: 15.9 10*3/uL — ABNORMAL HIGH (ref 4.0–10.5)

## 2013-11-02 LAB — GLUCOSE, CAPILLARY
Glucose-Capillary: 113 mg/dL — ABNORMAL HIGH (ref 70–99)
Glucose-Capillary: 93 mg/dL (ref 70–99)
Glucose-Capillary: 89 mg/dL (ref 70–99)

## 2013-11-02 LAB — PROTIME-INR
INR: 1.01 (ref 0.00–1.49)
Prothrombin Time: 13.1 seconds (ref 11.6–15.2)

## 2013-11-02 LAB — LIPASE, BLOOD: Lipase: 17 U/L (ref 11–59)

## 2013-11-02 MED ORDER — MOMETASONE FURO-FORMOTEROL FUM 200-5 MCG/ACT IN AERO
2.0000 | INHALATION_SPRAY | Freq: Two times a day (BID) | RESPIRATORY_TRACT | Status: DC
  Administered 2013-11-02 – 2013-11-03 (×3): 2 via RESPIRATORY_TRACT
  Filled 2013-11-02: qty 8.8

## 2013-11-02 MED ORDER — FENTANYL CITRATE 0.05 MG/ML IJ SOLN
50.0000 ug | INTRAMUSCULAR | Status: DC | PRN
  Administered 2013-11-02 (×2): 50 ug via INTRAVENOUS
  Filled 2013-11-02 (×2): qty 2

## 2013-11-02 MED ORDER — ALBUTEROL SULFATE HFA 108 (90 BASE) MCG/ACT IN AERS: 2.0000 | INHALATION_SPRAY | RESPIRATORY_TRACT | Status: DC | PRN

## 2013-11-02 MED ORDER — MORPHINE SULFATE 2 MG/ML IJ SOLN
2.0000 mg | INTRAMUSCULAR | Status: DC | PRN
  Administered 2013-11-02 – 2013-11-03 (×4): 2 mg via INTRAVENOUS
  Filled 2013-11-02 (×4): qty 1

## 2013-11-02 MED ORDER — PIPERACILLIN-TAZOBACTAM 3.375 G IVPB 30 MIN
3.3750 g | Freq: Once | INTRAVENOUS | Status: AC
  Administered 2013-11-02: 3.375 g via INTRAVENOUS
  Filled 2013-11-02: qty 50

## 2013-11-02 MED ORDER — ONDANSETRON HCL 4 MG/2ML IJ SOLN: 4.0000 mg | Freq: Four times a day (QID) | INTRAMUSCULAR | Status: DC | PRN

## 2013-11-02 MED ORDER — TIOTROPIUM BROMIDE MONOHYDRATE 18 MCG IN CAPS
18.0000 ug | ORAL_CAPSULE | Freq: Every day | RESPIRATORY_TRACT | Status: DC
  Administered 2013-11-02 – 2013-11-04 (×3): 18 ug via RESPIRATORY_TRACT
  Filled 2013-11-02: qty 5

## 2013-11-02 MED ORDER — IOHEXOL 300 MG/ML  SOLN
100.0000 mL | Freq: Once | INTRAMUSCULAR | Status: AC | PRN
  Administered 2013-11-02: 100 mL via INTRAVENOUS

## 2013-11-02 MED ORDER — ACETAMINOPHEN 650 MG RE SUPP: 650.0000 mg | Freq: Four times a day (QID) | RECTAL | Status: DC | PRN

## 2013-11-02 MED ORDER — PIPERACILLIN-TAZOBACTAM 3.375 G IVPB: 3.3750 g | Freq: Once | INTRAVENOUS | Status: DC

## 2013-11-02 MED ORDER — ONDANSETRON HCL 4 MG/2ML IJ SOLN
4.0000 mg | Freq: Once | INTRAMUSCULAR | Status: AC
Start: 2013-11-02 — End: 2013-11-02
  Administered 2013-11-02: 4 mg via INTRAVENOUS
  Filled 2013-11-02: qty 2

## 2013-11-02 MED ORDER — CLOBETASOL PROPIONATE 0.05 % EX OINT
1.0000 "application " | TOPICAL_OINTMENT | Freq: Two times a day (BID) | CUTANEOUS | Status: DC | PRN
  Filled 2013-11-02: qty 15

## 2013-11-02 MED ORDER — ONDANSETRON HCL 4 MG PO TABS: 4.0000 mg | ORAL_TABLET | Freq: Four times a day (QID) | ORAL | Status: DC | PRN

## 2013-11-02 MED ORDER — PIPERACILLIN-TAZOBACTAM 3.375 G IVPB
3.3750 g | Freq: Three times a day (TID) | INTRAVENOUS | Status: DC
  Administered 2013-11-02 – 2013-11-05 (×9): 3.375 g via INTRAVENOUS
  Filled 2013-11-02 (×13): qty 50

## 2013-11-02 MED ORDER — ALBUTEROL SULFATE (2.5 MG/3ML) 0.083% IN NEBU
2.5000 mg | INHALATION_SOLUTION | RESPIRATORY_TRACT | Status: DC | PRN
  Administered 2013-11-02 – 2013-11-03 (×2): 2.5 mg via RESPIRATORY_TRACT
  Filled 2013-11-02 (×2): qty 3

## 2013-11-02 MED ORDER — ACETAMINOPHEN 325 MG PO TABS: 650.0000 mg | ORAL_TABLET | Freq: Four times a day (QID) | ORAL | Status: DC | PRN

## 2013-11-02 MED ORDER — IOHEXOL 300 MG/ML  SOLN
50.0000 mL | Freq: Once | INTRAMUSCULAR | Status: AC | PRN
  Administered 2013-11-02: 50 mL via ORAL

## 2013-11-02 MED ORDER — FLUTICASONE PROPIONATE 50 MCG/ACT NA SUSP
2.0000 | Freq: Every day | NASAL | Status: DC | PRN
  Filled 2013-11-02: qty 16

## 2013-11-02 MED ORDER — SODIUM CHLORIDE 0.9 % IV SOLN: INTRAVENOUS | Status: AC

## 2013-11-02 NOTE — H&P (Signed)
[facility] History and Physical  Allen Green ZSW:[id number] DOB: 20-Sep-1950 DOA: 11/02/2013  Referring physician: ER physician. PCP: Lauraine Rinne, MD  Specialists: Dr. Lamonte Sakai. Pulmonologist.  Chief Complaint: Abdominal pain.  HPI: Allen Green is a 63 y.o. 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 Patient has been abdominal pain since last evening the right upper quadrant. Patient's pain increases with food. Denies any chest pain and has chronic shortness of breath but presently not in acute distress. Denies any nausea vomiting diarrhea fever chills. In the ER CT abdomen and pelvis shows features concerning for acute cholecystitis and sonogram shows gallstones. At this time Dr. Excell Seltzer surgeon on call has been consulted and patient has been admitted for further management. Patient has had a stress test done in 2012 which was negative for she may but did show right ventricular dysfunction probably from COPD.   Review of Systems: As presented in the history of presenting illness, rest negative.  Past Medical History  Diagnosis Date  . COPD (chronic obstructive pulmonary disease)     Probable - long-standing tobacco abuse  . Chronic pain     Bilateral knees and shoulders  . Pneumonia     Managed as outpatient  . GERD (gastroesophageal reflux disease)   . 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 [name]   Past Surgical History  Procedure Laterality Date  . Right total knee arthroplasty  2003  . Left total knee arthroplasty  1998    "left knee replacement is broken" - 08/09/13   Social History:  reports that he has been smoking Cigarettes.  He started smoking about 51 years ago. He has a 100 pack-year smoking history. He has never used smokeless tobacco. He reports that he does not drink alcohol or use illicit drugs. Where does patient live home. Can patient participate in ADLs? Yes.  No Known  Allergies  Family History:  Family History  Problem Relation Age of Onset  . Diabetes type II Father   . Coronary artery disease Father     MI in his 63s  . Diabetes Mother   . Diabetes Brother   . Diabetes Sister   . Asthma Sister       Prior to Admission medications   Medication Sig Start Date End Date Taking? Authorizing Provider  albuterol (PROVENTIL HFA;VENTOLIN HFA) 108 (90 BASE) MCG/ACT inhaler Inhale 2 puffs into the lungs every 4 (four) hours as needed for wheezing or shortness of breath.   Yes Historical Provider, MD  benzonatate (TESSALON) 200 MG capsule Take 1 capsule (200 mg total) by mouth 3 (three) times daily as needed for cough. 03/23/13  Yes Collene Gobble, MD  clobetasol ointment (TEMOVATE) 3.22 % Apply 1 application topically 2 (two) times daily as needed (rash).   Yes Historical Provider, MD  fluticasone (FLONASE) 50 MCG/ACT nasal spray Place 2 sprays into both nostrils daily as needed for allergies or rhinitis.   Yes Historical Provider, MD  mometasone-formoterol (DULERA) 200-5 MCG/ACT AERO Inhale 2 puffs into the lungs 2 (two) times daily. 10/25/13  Yes Collene Gobble, MD  tiotropium (SPIRIVA HANDIHALER) 18 MCG inhalation capsule Place 1 capsule (18 mcg total) into inhaler and inhale daily. 08/01/13 08/01/14 Yes Collene Gobble, MD    Physical Exam: Filed Vitals:   11/02/13 0122 11/02/13 0428 11/02/13 0515 11/02/13 0534  BP: 149/85   110/90  Pulse: 86     Temp:  97.8 F (36.6 C)   97.4 F (36.3 C)  TempSrc: Oral   Oral  Resp: 20 12 13 13   SpO2: 95%   94%     General:  Well-developed well-nourished.  Eyes: Anicteric no pallor.  ENT: No discharge from the ears eyes nose mouth.  Neck: No mass felt.  Cardiovascular: S1-S2 heard.  Respiratory: No rhonchi or crepitations.  Abdomen: Soft mild tenderness in the right upper quadrant no guarding or rigidity.  Skin: No rash.  Musculoskeletal: No edema.  Psychiatric: Appears normal.  Neurologic: Alert  awake oriented to time place and person. Moves all extremities.  Labs on Admission:  Basic Metabolic Panel:  Recent Labs Lab 11/02/13 0200  NA 136*  K 4.2  CL 99  CO2 25  GLUCOSE 118*  BUN 22  CREATININE 0.91  CALCIUM 9.4   Liver Function Tests:  Recent Labs Lab 11/02/13 0200  AST 18  ALT 21  ALKPHOS 90  BILITOT 0.4  PROT 7.9  ALBUMIN 3.8    Recent Labs Lab 11/02/13 0200  LIPASE 17   No results found for this basename: AMMONIA,  in the last 168 hours CBC:  Recent Labs Lab 11/02/13 0200  WBC 15.9*  HGB 16.5  HCT 47.0  MCV 93.4  PLT 322   Cardiac Enzymes: No results found for this basename: CKTOTAL, CKMB, CKMBINDEX, TROPONINI,  in the last 168 hours  BNP (last 3 results)  Recent Labs  08/09/13 1550  PROBNP 222.0*   CBG: No results found for this basename: GLUCAP,  in the last 168 hours  Radiological Exams on Admission: Ct Abdomen Pelvis W Contrast  11/02/2013   CLINICAL DATA:  Abdominal pain, distention, nausea.  EXAM: CT ABDOMEN AND PELVIS WITH CONTRAST  TECHNIQUE: Multidetector CT imaging of the abdomen and pelvis was performed using the standard protocol following bolus administration of intravenous contrast.  CONTRAST:  163mL OMNIPAQUE IOHEXOL 300 MG/ML  SOLN  COMPARISON:  CT CHEST W/CM dated 04/27/2011  FINDINGS: Included view of the lung bases 4 mm pulmonary nodule in right middle lobe, axial 1/22, unchanged. Visualized heart and pericardium are unremarkable.  Moderate gallbladder wall thickening and inflammatory changes. No CT findings of cholelithiasis or choledocholithiasis. The liver, spleen, pancreas and adrenal glands are unremarkable.  The stomach, small and large bowel are normal in course and caliber without inflammatory changes. A few scattered colonic diverticula seen. Normal appendix. No intraperitoneal free fluid nor free air.  Kidneys are orthotopic, demonstrating symmetric enhancement punctate nonobstructing right interpolar renal  calculus. No hydronephrosis or renal masses. The unopacified ureters are normal in course and caliber. Delayed imaging through the kidneys demonstrates symmetric prompt excretion to the proximal urinary collecting system. Urinary bladder is partially distended and unremarkable.  The aortoiliac vessels are normal in course and caliber with moderate to severe intimal thickening and calcific atherosclerosis, no aneurysm. No lymphadenopathy by CT size criteria. Internal reproductive organs are unremarkable. Chronic bilateral L5 pars interarticularis defects without spondylolisthesis. Mild degenerative change of lumbar spine.  IMPRESSION: Gallbladder wall thickening and inflammatory changes consistent with acute cholecystitis.   Electronically Signed   By: Elon Alas   On: 11/02/2013 03:41   US Abdomen Limited Ruq  11/02/2013   CLINICAL DATA:  Abdominal pain.  EXAM: US ABDOMEN LIMITED - RIGHT UPPER QUADRANT  COMPARISON:  None.  FINDINGS: Gallbladder:  Multiple stones are seen dependently within the gallbladder, measuring up to 1.8 cm in size. There is suggestion of mild gallbladder wall thickening, but no  pericholecystic fluid is seen, and no ultrasonographic Murphy's sign is elicited.  Common bile duct:  Diameter: 0.5 cm, within normal limits in caliber.  Liver:  No focal lesion identified. Diffusely increased parenchymal echogenicity and coarsened echotexture, compatible with fatty infiltration.  IMPRESSION: 1. Cholelithiasis noted; suggestion of mild gallbladder wall thickening, without additional evidence to suggest cholecystitis or obstruction. No ultrasonographic Murphy's sign seen. 2. Diffuse fatty infiltration within the liver.   Electronically Signed   By: Garald Balding M.D.   On: 11/02/2013 05:35    EKG: Independently reviewed. Normal sinus rhythm.  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 Acute cholecystitis - patient has been placed n.p.o. and started on Zosyn. I have discussed with on-call surgeon Dr. Excell Seltzer and since patient has severe COPD at this time for risk assessment I have also consulted pulmonologist Dr. Lamonte Sakai. Based on the risk patient will either have surgery or percutaneous drain. 2. COPD - chest x-ray is pending. Patient is not in respiratory distress. On exam is not wheezing. Continue inhalers. 3. History of polycythemia secondary COPD - follow CBC. 4. Leukocytosis - probably from #1. Patient is on antibiotics.  I have reviewed patient's old charts and labs. Have discussed with on-call surgeon Dr. Excell Seltzer and pulmonologist Dr. Lamonte Sakai.  Code Status: Full code.  Family Communication: Patient's wife.  Disposition Plan: Admit to inpatient.    Allen Green N. [facility] Pager [phone].  If 7PM-7AM, please contact night-coverage [email or web address] Password [facility] 11/02/2013, 5:51 AM

## 2013-11-02 NOTE — Progress Notes (Signed)
ANTIBIOTIC CONSULT NOTE - INITIAL  Pharmacy Consult for zosyn Indication: Intra-abdominal infection  No Known Allergies  Patient Measurements:   Adjusted Body Weight:   Vital Signs: Temp: 97.4 F (36.3 C) (04/02 0534) Temp src: Oral (04/02 0534) BP: 110/90 mmHg (04/02 0534) Pulse Rate: 86 (04/02 0122) Intake/Output from previous day:   Intake/Output from this shift:    Labs:  Recent Labs  11/02/13 0200  WBC 15.9*  HGB 16.5  PLT 322  CREATININE 0.91   The CrCl is unknown because both a height and weight (above a minimum accepted value) are required for this calculation. No results found for this basename: VANCOTROUGH, VANCOPEAK, VANCORANDOM, GENTTROUGH, GENTPEAK, GENTRANDOM, TOBRATROUGH, TOBRAPEAK, TOBRARND, AMIKACINPEAK, AMIKACINTROU, AMIKACIN,  in the last 72 hours   Microbiology: No results found for this or any previous visit (from the past 720 hour(s)).  Medical History: Past Medical History  Diagnosis Date  . COPD (chronic obstructive pulmonary disease)     Probable - long-standing tobacco abuse  . Chronic pain     Bilateral knees and shoulders  . Pneumonia     Managed as outpatient  . GERD (gastroesophageal reflux disease)   . 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 [name]    Medications:  Anti-infectives   Start     Dose/Rate Route Frequency Ordered Stop   11/02/13 1400  piperacillin-tazobactam (ZOSYN) IVPB 3.375 g     3.375 g 12.5 mL/hr over 240 Minutes Intravenous 3 times per day 11/02/13 0602     11/02/13 0500  piperacillin-tazobactam (ZOSYN) IVPB 3.375 g  Status:  Discontinued     3.375 g 12.5 mL/hr over 240 Minutes Intravenous  Once 11/02/13 0449 11/02/13 0455   11/02/13 0500  piperacillin-tazobactam (ZOSYN) IVPB 3.375 g     3.375 g 100 mL/hr over 30 Minutes Intravenous  Once 11/02/13 0455 11/02/13 0535     Assessment: Patient with intra-abdominal infection. First dose of antibiotics already given.  Goal of Therapy:  Zosyn based on  renal function   Plan:  Follow up culture results Zosyn 3.375g IV Q8H infused over 4hrs.   Tyler Deis, Shea Stakes Crowford 11/02/2013,6:07 AM

## 2013-11-02 NOTE — Progress Notes (Addendum)
General surgery note:  I have interviewed and examined this patient. I reviewed his imaging studies. I have reviewed Dr. Bari Mantis pulmonary assessment and recommendations. He continues to smoke 2-3 packs of cigarettes per day, is oxygen-dependent COPD, and is at high risk for pulmonary complications and postop respiratory failure. His pulmonary disease, however, is probably fixed and will not improve over time. I have discussed treatment options including cholecystectomy with the patient, his wife, and both of his daughters.  He has acute cholecystitis, although there is no evidence of rupture and no evidence of gangrene this point. He feels better and is hungry and is begging for food. He still has some discomfort in the right upper quadrant. On exam he is tender in the right upper quadrant but no palpable  mass. His abdomen is not distended.  Liver function tests are normal. White blood cell count is 12,500.  I discussed 3 options with him. I discussed laparoscopic cholecystectomy, possible open cholecystectomy. I discussed the risks of the procedure itself and the risks of postoperative respiratory  failure. I told him that that was somewhat unpredictable but certainly he is at high risk. Also discussed the option of antibiotics and percutaneous drainage., with high probability of resolving this episode, a low probability a respiratory failure, but he will be at increased risk for recurrent cholecystitis in the future. We talked about antibiotics alone, I discouraged that as being less likely to resolve this acute event. We talked about the natural history and all of these scenarios. We talked about the risks as well as the potential benefits of one approach over the other.  It is his strong desire to avoid surgery and have the percutaneous cholecystostomy performed. He accepts the risk of recurrent attacks in the future and the possibility of requiring surgery in the future.His wife is comfortable with  that as well.  I think from a medical perspective it is reasonable to do what he wants to do in this setting.  I strongly encouraged him to stop smoking.  We will arrange for percutaneous cholecystostomy to be done tomorrow. Orders have been entered. We will continue to follow him.   Allen Green. Dalbert Batman, M.D., [facility] General and Minimally invasive Surgery Breast and Colorectal Surgery Office:   [phone] Pager:   [phone]

## 2013-11-02 NOTE — Progress Notes (Signed)
Patient ID: Allen Green, male   DOB: 1950-12-13, 63 y.o.   MRN: [id number] Request received for percutaneous cholecystostomy tube placement on pt with hx RUQ pain, leukocytosis, cholelithiasis/GB wall thickening, poor surgical candidate. Additional PMH as below. Imaging studies were reviewed by Dr. Anselm Pancoast. Exam:pt awake/alert; chest- few exp wheezes, sl dim BS bases; heart -RRR; abd- obese,soft, mild RUQ discomfort with deep palpation; ext- FROM, no edema.   Filed Vitals:   11/02/13 0534 11/02/13 0617 11/02/13 0832 11/02/13 1334  BP: 110/90 139/75  106/74  Pulse:  81  84  Temp: 97.4 F (36.3 C) 97.7 F (36.5 C)  98.1 F (36.7 C)  TempSrc: Oral Oral  Oral  Resp: 13 14  16   Weight:  267 lb 12.8 oz (121.473 kg)    SpO2: 94% 97% 92% 94%   Past Medical History  Diagnosis Date  . COPD (chronic obstructive pulmonary disease)     Probable - long-standing tobacco abuse  . Chronic pain     Bilateral knees and shoulders  . Pneumonia     Managed as outpatient  . GERD (gastroesophageal reflux disease)   . 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 [name]   Past Surgical History  Procedure Laterality Date  . Right total knee arthroplasty  2003  . Left total knee arthroplasty  1998    "left knee replacement is broken" - 08/09/13   Ct Abdomen Pelvis W Contrast  11/02/2013   CLINICAL DATA:  Abdominal pain, distention, nausea.  EXAM: CT ABDOMEN AND PELVIS WITH CONTRAST  TECHNIQUE: Multidetector CT imaging of the abdomen and pelvis was performed using the standard protocol following bolus administration of intravenous contrast.  CONTRAST:  141mL OMNIPAQUE IOHEXOL 300 MG/ML  SOLN  COMPARISON:  CT CHEST W/CM dated 04/27/2011  FINDINGS: Included view of the lung bases 4 mm pulmonary nodule in right middle lobe, axial 1/22, unchanged. Visualized heart and pericardium are unremarkable.  Moderate gallbladder wall thickening and inflammatory changes. No CT findings of cholelithiasis or choledocholithiasis. The liver,  spleen, pancreas and adrenal glands are unremarkable.  The stomach, small and large bowel are normal in course and caliber without inflammatory changes. A few scattered colonic diverticula seen. Normal appendix. No intraperitoneal free fluid nor free air.  Kidneys are orthotopic, demonstrating symmetric enhancement punctate nonobstructing right interpolar renal calculus. No hydronephrosis or renal masses. The unopacified ureters are normal in course and caliber. Delayed imaging through the kidneys demonstrates symmetric prompt excretion to the proximal urinary collecting system. Urinary bladder is partially distended and unremarkable.  The aortoiliac vessels are normal in course and caliber with moderate to severe intimal thickening and calcific atherosclerosis, no aneurysm. No lymphadenopathy by CT size criteria. Internal reproductive organs are unremarkable. Chronic bilateral L5 pars interarticularis defects without spondylolisthesis. Mild degenerative change of lumbar spine.  IMPRESSION: Gallbladder wall thickening and inflammatory changes consistent with acute cholecystitis.   Electronically Signed   By: Elon Alas   On: 11/02/2013 03:41   Dg Chest Port 1 View  11/02/2013   CLINICAL DATA:  Cough, congestion and shortness of breath.  EXAM: PORTABLE CHEST - 1 VIEW  COMPARISON:  Chest radiograph performed 08/28/2013  FINDINGS: The lungs are well-aerated. Vascular congestion is noted. Increased interstitial markings may reflect mild interstitial edema. No definite pleural effusion or pneumothorax is seen.  The cardiomediastinal silhouette is within normal limits. No acute osseous abnormalities are seen.  IMPRESSION: Vascular congestion noted. Increased interstitial markings may reflect mild interstitial edema.   Electronically  Signed   By: Garald Balding M.D.   On: 11/02/2013 06:00   US Abdomen Limited Ruq  11/02/2013   CLINICAL DATA:  Abdominal pain.  EXAM: US ABDOMEN LIMITED - RIGHT UPPER QUADRANT   COMPARISON:  None.  FINDINGS: Gallbladder:  Multiple stones are seen dependently within the gallbladder, measuring up to 1.8 cm in size. There is suggestion of mild gallbladder wall thickening, but no pericholecystic fluid is seen, and no ultrasonographic Murphy's sign is elicited.  Common bile duct:  Diameter: 0.5 cm, within normal limits in caliber.  Liver:  No focal lesion identified. Diffusely increased parenchymal echogenicity and coarsened echotexture, compatible with fatty infiltration.  IMPRESSION: 1. Cholelithiasis noted; suggestion of mild gallbladder wall thickening, without additional evidence to suggest cholecystitis or obstruction. No ultrasonographic Murphy's sign seen. 2. Diffuse fatty infiltration within the liver.   Electronically Signed   By: Garald Balding M.D.   On: 11/02/2013 05:35  Results for orders placed during the hospital encounter of 11/02/13  CBC      Result Value Ref Range   WBC 15.9 (*) 4.0 - 10.5 K/uL   RBC 5.03  4.22 - 5.81 MIL/uL   Hemoglobin 16.5  13.0 - 17.0 g/dL   HCT 47.0  39.0 - 52.0 %   MCV 93.4  78.0 - 100.0 fL   MCH 32.8  26.0 - 34.0 pg   MCHC 35.1  30.0 - 36.0 g/dL   RDW 14.2  11.5 - 15.5 %   Platelets 322  150 - 400 K/uL  COMPREHENSIVE METABOLIC PANEL      Result Value Ref Range   Sodium 136 (*) 137 - 147 mEq/L   Potassium 4.2  3.7 - 5.3 mEq/L   Chloride 99  96 - 112 mEq/L   CO2 25  19 - 32 mEq/L   Glucose, Bld 118 (*) 70 - 99 mg/dL   BUN 22  6 - 23 mg/dL   Creatinine, Ser 0.91  0.50 - 1.35 mg/dL   Calcium 9.4  8.4 - 10.5 mg/dL   Total Protein 7.9  6.0 - 8.3 g/dL   Albumin 3.8  3.5 - 5.2 g/dL   AST 18  0 - 37 U/L   ALT 21  0 - 53 U/L   Alkaline Phosphatase 90  39 - 117 U/L   Total Bilirubin 0.4  0.3 - 1.2 mg/dL   GFR calc non Af Amer 89 (*) >90 mL/min   GFR calc Af Amer >90  >90 mL/min  LIPASE, BLOOD      Result Value Ref Range   Lipase 17  11 - 59 U/L  PROTIME-INR      Result Value Ref Range   Prothrombin Time 13.1  11.6 - 15.2 seconds    INR 1.01  0.00 - 1.49  COMPREHENSIVE METABOLIC PANEL      Result Value Ref Range   Sodium 134 (*) 137 - 147 mEq/L   Potassium 4.2  3.7 - 5.3 mEq/L   Chloride 98  96 - 112 mEq/L   CO2 26  19 - 32 mEq/L   Glucose, Bld 114 (*) 70 - 99 mg/dL   BUN 17  6 - 23 mg/dL   Creatinine, Ser 0.85  0.50 - 1.35 mg/dL   Calcium 9.1  8.4 - 10.5 mg/dL   Total Protein 7.0  6.0 - 8.3 g/dL   Albumin 3.6  3.5 - 5.2 g/dL   AST 18  0 - 37 U/L   ALT 20  0 -  53 U/L   Alkaline Phosphatase 76  39 - 117 U/L   Total Bilirubin 0.5  0.3 - 1.2 mg/dL   GFR calc non Af Amer >90  >90 mL/min   GFR calc Af Amer >90  >90 mL/min  CBC WITH DIFFERENTIAL      Result Value Ref Range   WBC 12.5 (*) 4.0 - 10.5 K/uL   RBC 4.67  4.22 - 5.81 MIL/uL   Hemoglobin 14.5  13.0 - 17.0 g/dL   HCT 43.3  39.0 - 52.0 %   MCV 92.7  78.0 - 100.0 fL   MCH 31.0  26.0 - 34.0 pg   MCHC 33.5  30.0 - 36.0 g/dL   RDW 14.2  11.5 - 15.5 %   Platelets 316  150 - 400 K/uL   Neutrophils Relative % 66  43 - 77 %   Neutro Abs 8.3 (*) 1.7 - 7.7 K/uL   Lymphocytes Relative 22  12 - 46 %   Lymphs Abs 2.7  0.7 - 4.0 K/uL   Monocytes Relative 8  3 - 12 %   Monocytes Absolute 1.0  0.1 - 1.0 K/uL   Eosinophils Relative 4  0 - 5 %   Eosinophils Absolute 0.5  0.0 - 0.7 K/uL   Basophils Relative 1  0 - 1 %   Basophils Absolute 0.1  0.0 - 0.1 K/uL  GLUCOSE, CAPILLARY      Result Value Ref Range   Glucose-Capillary 113 (*) 70 - 99 mg/dL  GLUCOSE, CAPILLARY      Result Value Ref Range   Glucose-Capillary 93  70 - 99 mg/dL   Comment 1 Notify RN    GLUCOSE, CAPILLARY      Result Value Ref Range   Glucose-Capillary 89  70 - 99 mg/dL   Comment 1 Notify RN     A/P: Pt with hx RUQ pain, leukocytosis, cholelithiasis/GB wall thickening, poor surgical candidate. Tent plan is for perc cholecystostomy tube placement on 4/3. Details/risks of procedure d/w pt/wife with their understanding and consent.

## 2013-11-02 NOTE — ED Notes (Signed)
Per Pt report: pt sick all day today but has become progressively worse this evening.  Pt's pain hurts so bad that pt could not lay down.  Pt reports abd is distended and putting pressure on his breathing. Pt is purse lips breathing in triage. Pt a/o x 4.  Skin warm and dry.
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
## 2013-11-02 NOTE — Consult Note (Addendum)
PULMONARY / CRITICAL CARE MEDICINE   Name: Allen Green MRN: [id number] DOB: 14-May-1951    ADMISSION DATE:  11/02/2013 CONSULTATION DATE: 4/2  REFERRING MD :  [facility] PRIMARY SERVICE: [facility]  CHIEF COMPLAINT:  Abd pain  BRIEF PATIENT DESCRIPTION:  63 yo obese 2-3 ppd smoker, O 2 dependent x 3 years, followed by Dr. Lamonte Sakai who presented 4-1 with 24 hours of abdominal pain and positive for cholecystitis. He is able to ambulate with O2 and broken left knee prosthesis about 30 feet without SOB. PCCM asked to evaluate for surgical intervention and help stratify risks and benefits.  FEV1 48% 01/2011   SIGNIFICANT EVENTS / STUDIES:  4/1 ct abd with cholecystitis.   LINES / TUBES:   CULTURES:   ANTIBIOTICS: 4/2 pip-tazo>>  HISTORY OF PRESENT ILLNESS:   63 yo 2-3 ppd smoker, O 2 dependent x 3 years, followed by Dr. Lamonte Sakai who presented 4-1 with 24 hours of abdominal pain and positive for cholecystitis. He is able to ambulate with O2 and broken left knee prosthesis about 30 feet without SOB. PCCM asked to evaluate for surgical intervention and help stratify risks and benefits.on Dulera and Spiriva   PULMONARY FUNCTON TEST  01/07/2011  FVC 4.39  FEV1 1.61  FEV1/FVC 36.7  FVC % Predicted 92  FEV % Predicted 48     PAST MEDICAL HISTORY :  Past Medical History  Diagnosis Date  . COPD (chronic obstructive pulmonary disease)     Probable - long-standing tobacco abuse  . Chronic pain     Bilateral knees and shoulders  . Pneumonia     Managed as outpatient  . GERD (gastroesophageal reflux disease)   . 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 [name]   Past Surgical History  Procedure Laterality Date  . Right total knee arthroplasty  2003  . Left total knee arthroplasty  1998    "left knee replacement is broken" - 08/09/13   Prior to Admission medications   Medication Sig Start Date End Date Taking? Authorizing Provider  albuterol (PROVENTIL HFA;VENTOLIN HFA) 108 (90 BASE) MCG/ACT inhaler  Inhale 2 puffs into the lungs every 4 (four) hours as needed for wheezing or shortness of breath.   Yes Historical Provider, MD  benzonatate (TESSALON) 200 MG capsule Take 1 capsule (200 mg total) by mouth 3 (three) times daily as needed for cough. 03/23/13  Yes Collene Gobble, MD  clobetasol ointment (TEMOVATE) 0.62 % Apply 1 application topically 2 (two) times daily as needed (rash).   Yes Historical Provider, MD  fluticasone (FLONASE) 50 MCG/ACT nasal spray Place 2 sprays into both nostrils daily as needed for allergies or rhinitis.   Yes Historical Provider, MD  mometasone-formoterol (DULERA) 200-5 MCG/ACT AERO Inhale 2 puffs into the lungs 2 (two) times daily. 10/25/13  Yes Collene Gobble, MD  tiotropium (SPIRIVA HANDIHALER) 18 MCG inhalation capsule Place 1 capsule (18 mcg total) into inhaler and inhale daily. 08/01/13 08/01/14 Yes Collene Gobble, MD   No Known Allergies  FAMILY HISTORY:  Family History  Problem Relation Age of Onset  . Diabetes type II Father   . Coronary artery disease Father     MI in his 19s  . Diabetes Mother   . Diabetes Brother   . Diabetes Sister   . Asthma Sister    SOCIAL HISTORY:  reports that he has been smoking Cigarettes.  He started smoking about 51 years ago. He has a 100 pack-year smoking history. He has  never used smokeless tobacco. He reports that he does not drink alcohol or use illicit drugs.  REVIEW OF SYSTEMS:   10 point review of system taken, please see HPI for positives and negatives.   SUBJECTIVE:   VITAL SIGNS: Temp:  [97.4 F (36.3 C)-97.8 F (36.6 C)] 97.7 F (36.5 C) (04/02 0617) Pulse Rate:  [81-86] 81 (04/02 0617) Resp:  [12-20] 14 (04/02 0617) BP: (110-149)/(75-90) 139/75 mmHg (04/02 0617) SpO2:  [92 %-97 %] 92 % (04/02 0832) Weight:  [121.473 kg (267 lb 12.8 oz)] 121.473 kg (267 lb 12.8 oz) (04/02 0617) HEMODYNAMICS:   VENTILATOR SETTINGS:   INTAKE / OUTPUT: Intake/Output     04/01 0701 - 04/02 0700 04/02 0701 -  04/03 0700   Urine (mL/kg/hr)  400 (0.9)   Total Output   400   Net   -400          PHYSICAL EXAMINATION: General:  Obese WM NAD at rest on O2 Neuro:  Intact HEENT:  No LAN/JVD Cardiovascular:  HSR RRR Lungs:  Forced exp wheeze otherwise clear Abdomen:  Sogt, + bs , suprisingly non tender  Musculoskeletal:  Left knee misshapen  Skin:  Warm/dry/no edema  LABS:  CBC  Recent Labs Lab 11/02/13 0200 11/02/13 0815  WBC 15.9* 12.5*  HGB 16.5 14.5  HCT 47.0 43.3  PLT 322 316   Coag's  Recent Labs Lab 11/02/13 0815  INR 1.01   BMET  Recent Labs Lab 11/02/13 0200 11/02/13 0815  NA 136* 134*  K 4.2 4.2  CL 99 98  CO2 25 26  BUN 22 17  CREATININE 0.91 0.85  GLUCOSE 118* 114*   Electrolytes  Recent Labs Lab 11/02/13 0200 11/02/13 0815  CALCIUM 9.4 9.1   Sepsis Markers No results found for this basename: LATICACIDVEN, PROCALCITON, O2SATVEN,  in the last 168 hours ABG No results found for this basename: PHART, PCO2ART, PO2ART,  in the last 168 hours Liver Enzymes  Recent Labs Lab 11/02/13 0200 11/02/13 0815  AST 18 18  ALT 21 20  ALKPHOS 90 76  BILITOT 0.4 0.5  ALBUMIN 3.8 3.6   Cardiac Enzymes No results found for this basename: TROPONINI, PROBNP,  in the last 168 hours Glucose  Recent Labs Lab 11/02/13 0725  GLUCAP 113*    Imaging Ct Abdomen Pelvis W Contrast  11/02/2013   CLINICAL DATA:  Abdominal pain, distention, nausea.  EXAM: CT ABDOMEN AND PELVIS WITH CONTRAST  TECHNIQUE: Multidetector CT imaging of the abdomen and pelvis was performed using the standard protocol following bolus administration of intravenous contrast.  CONTRAST:  149mL OMNIPAQUE IOHEXOL 300 MG/ML  SOLN  COMPARISON:  CT CHEST W/CM dated 04/27/2011  FINDINGS: Included view of the lung bases 4 mm pulmonary nodule in right middle lobe, axial 1/22, unchanged. Visualized heart and pericardium are unremarkable.  Moderate gallbladder wall thickening and inflammatory changes. No CT  findings of cholelithiasis or choledocholithiasis. The liver, spleen, pancreas and adrenal glands are unremarkable.  The stomach, small and large bowel are normal in course and caliber without inflammatory changes. A few scattered colonic diverticula seen. Normal appendix. No intraperitoneal free fluid nor free air.  Kidneys are orthotopic, demonstrating symmetric enhancement punctate nonobstructing right interpolar renal calculus. No hydronephrosis or renal masses. The unopacified ureters are normal in course and caliber. Delayed imaging through the kidneys demonstrates symmetric prompt excretion to the proximal urinary collecting system. Urinary bladder is partially distended and unremarkable.  The aortoiliac vessels are normal in course and caliber  with moderate to severe intimal thickening and calcific atherosclerosis, no aneurysm. No lymphadenopathy by CT size criteria. Internal reproductive organs are unremarkable. Chronic bilateral L5 pars interarticularis defects without spondylolisthesis. Mild degenerative change of lumbar spine.  IMPRESSION: Gallbladder wall thickening and inflammatory changes consistent with acute cholecystitis.   Electronically Signed   By: Elon Alas   On: 11/02/2013 03:41   Dg Chest Port 1 View  11/02/2013   CLINICAL DATA:  Cough, congestion and shortness of breath.  EXAM: PORTABLE CHEST - 1 VIEW  COMPARISON:  Chest radiograph performed 08/28/2013  FINDINGS: The lungs are well-aerated. Vascular congestion is noted. Increased interstitial markings may reflect mild interstitial edema. No definite pleural effusion or pneumothorax is seen.  The cardiomediastinal silhouette is within normal limits. No acute osseous abnormalities are seen.  IMPRESSION: Vascular congestion noted. Increased interstitial markings may reflect mild interstitial edema.   Electronically Signed   By: Garald Balding M.D.   On: 11/02/2013 06:00   US Abdomen Limited Ruq  11/02/2013   CLINICAL DATA:   Abdominal pain.  EXAM: US ABDOMEN LIMITED - RIGHT UPPER QUADRANT  COMPARISON:  None.  FINDINGS: Gallbladder:  Multiple stones are seen dependently within the gallbladder, measuring up to 1.8 cm in size. There is suggestion of mild gallbladder wall thickening, but no pericholecystic fluid is seen, and no ultrasonographic Murphy's sign is elicited.  Common bile duct:  Diameter: 0.5 cm, within normal limits in caliber.  Liver:  No focal lesion identified. Diffusely increased parenchymal echogenicity and coarsened echotexture, compatible with fatty infiltration.  IMPRESSION: 1. Cholelithiasis noted; suggestion of mild gallbladder wall thickening, without additional evidence to suggest cholecystitis or obstruction. No ultrasonographic Murphy's sign seen. 2. Diffuse fatty infiltration within the liver.   Electronically Signed   By: Garald Balding M.D.   On: 11/02/2013 05:35    PULMONARY FUNCTON TEST  01/07/2011   FVC  4.39   FEV1  1.61   FEV1/FVC  36.7   FVC % Predicted  92   FEV % Predicted  48   FeF 25-75  .39   FeF 25-75 % Predicted  3.2     EKG - nSR, no ST/T changes    ASSESSMENT / PLAN:  PULMONARY A: O2 dependent COPD with continued tobacco abuse with activity limited 30 feet of ambulation due to left knee issues. He is at high risk for post operative pulmonary complication due to his COPD and continued tobacco abuse but the risks are not prohibitive. Probable OSA  P:   O2 as needed BD's Pulmonary toilet PCCM will follow post operative Smoking cessation Would consider standard precautions for OSA peri-operatively including perhaps empiric CPAP if witnessed apneas or desatns Denies chest pain, pre-op EKG is nml I had a frank discussion with him about risks of surgery, he will discuss with the surgeon & consult his family before deciding.  Kara Mead MD. Shade Flood. [facility] Pager [phone] If no response call [phone]    11/02/2013, 10:51 AM

## 2013-11-02 NOTE — ED Notes (Signed)
Patient transported to CT 

## 2013-11-02 NOTE — Consult Note (Signed)
Reason for Consult: abdominal pain, cholecystitis Referring Physician: [name] is an 63 y.o. male.   HPI: the patient was in his usual state of health until yesterday morning when he had the gradual onset of malaise and nausea and then right-sided abdominal pain. The abdominal pain gradually worsened through the night until early this morning when it became very severe and he presented to the emergency room. He describes pressure and aching pain in his right mid abdomen radiating around toward his back. He has remained nauseated but no vomiting. No fever or chills. No diarrhea or constipation or melena or hematochezia. He denies any previous similar symptoms or chronic GI complaints. Patient has very significant COPD with associated polycythemia and some degree of right heart failure. He has a poor performance status really just able to walk around his house.  Past Medical History  Diagnosis Date  . COPD (chronic obstructive pulmonary disease)     Probable - long-standing tobacco abuse  . Chronic pain     Bilateral knees and shoulders  . Pneumonia     Managed as outpatient  . GERD (gastroesophageal reflux disease)   . 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 [name]    Past Surgical History  Procedure Laterality Date  . Right total knee arthroplasty  2003  . Left total knee arthroplasty  1998    "left knee replacement is broken" - 08/09/13    Family History  Problem Relation Age of Onset  . Diabetes type II Father   . Coronary artery disease Father     MI in his 69s  . Diabetes Mother   . Diabetes Brother   . Diabetes Sister   . Asthma Sister     Social History:  reports that he has been smoking Cigarettes.  He started smoking about 51 years ago. He has a 100 pack-year smoking history. He has never used smokeless tobacco. He reports that he does not drink alcohol or use illicit drugs.  Allergies: No Known Allergies  Current Facility-Administered Medications   Medication Dose Route Frequency Provider Last Rate Last Dose  . 0.9 %  sodium chloride infusion   Intravenous Continuous Rise Patience, MD 50 mL/hr at 11/02/13 0600    . acetaminophen (TYLENOL) tablet 650 mg  650 mg Oral Q6H PRN Rise Patience, MD       Or  . acetaminophen (TYLENOL) suppository 650 mg  650 mg Rectal Q6H PRN Rise Patience, MD      . albuterol (PROVENTIL) (2.5 MG/3ML) 0.083% nebulizer solution 2.5 mg  2.5 mg Nebulization Q2H PRN Rise Patience, MD   2.5 mg at 11/02/13 0640  . clobetasol ointment (TEMOVATE) 9.56 % 1 application  1 application Topical BID PRN Rise Patience, MD      . fluticasone (FLONASE) 50 MCG/ACT nasal spray 2 spray  2 spray Each Nare Daily PRN Rise Patience, MD      . mometasone-formoterol (DULERA) 200-5 MCG/ACT inhaler 2 puff  2 puff Inhalation BID Rise Patience, MD      . morphine 2 MG/ML injection 2 mg  2 mg Intravenous Q3H PRN Rise Patience, MD      . ondansetron [facility]) tablet 4 mg  4 mg Oral Q6H PRN Rise Patience, MD       Or  . ondansetron (ZOFRAN) injection 4 mg  4 mg Intravenous Q6H PRN Rise Patience, MD      . piperacillin-tazobactam (  ZOSYN) IVPB 3.375 g  3.375 g Intravenous 3 times per day Rise Patience, MD      . tiotropium [facility]) inhalation capsule 18 mcg  18 mcg Inhalation Daily Rise Patience, MD         Results for orders placed during the hospital encounter of 11/02/13 (from the past 48 hour(s))  CBC     Status: Abnormal   Collection Time    11/02/13  2:00 AM      Result Value Ref Range   WBC 15.9 (*) 4.0 - 10.5 K/uL   RBC 5.03  4.22 - 5.81 MIL/uL   Hemoglobin 16.5  13.0 - 17.0 g/dL   HCT 47.0  39.0 - 52.0 %   MCV 93.4  78.0 - 100.0 fL   MCH 32.8  26.0 - 34.0 pg   MCHC 35.1  30.0 - 36.0 g/dL   RDW 14.2  11.5 - 15.5 %   Platelets 322  150 - 400 K/uL  COMPREHENSIVE METABOLIC PANEL     Status: Abnormal   Collection Time    11/02/13  2:00 AM      Result Value  Ref Range   Sodium 136 (*) 137 - 147 mEq/L   Potassium 4.2  3.7 - 5.3 mEq/L   Chloride 99  96 - 112 mEq/L   CO2 25  19 - 32 mEq/L   Glucose, Bld 118 (*) 70 - 99 mg/dL   BUN 22  6 - 23 mg/dL   Creatinine, Ser 0.91  0.50 - 1.35 mg/dL   Calcium 9.4  8.4 - 10.5 mg/dL   Total Protein 7.9  6.0 - 8.3 g/dL   Albumin 3.8  3.5 - 5.2 g/dL   AST 18  0 - 37 U/L   ALT 21  0 - 53 U/L   Alkaline Phosphatase 90  39 - 117 U/L   Total Bilirubin 0.4  0.3 - 1.2 mg/dL   GFR calc non Af Amer 89 (*) >90 mL/min   GFR calc Af Amer >90  >90 mL/min   Comment: (NOTE)     The eGFR has been calculated using the CKD EPI equation.     This calculation has not been validated in all clinical situations.     eGFR's persistently <90 mL/min signify possible Chronic Kidney     Disease.  LIPASE, BLOOD     Status: None   Collection Time    11/02/13  2:00 AM      Result Value Ref Range   Lipase 17  11 - 59 U/L    Ct Abdomen Pelvis W Contrast  11/02/2013   CLINICAL DATA:  Abdominal pain, distention, nausea.  EXAM: CT ABDOMEN AND PELVIS WITH CONTRAST  TECHNIQUE: Multidetector CT imaging of the abdomen and pelvis was performed using the standard protocol following bolus administration of intravenous contrast.  CONTRAST:  186m OMNIPAQUE IOHEXOL 300 MG/ML  SOLN  COMPARISON:  CT CHEST W/CM dated 04/27/2011  FINDINGS: Included view of the lung bases 4 mm pulmonary nodule in right middle lobe, axial 1/22, unchanged. Visualized heart and pericardium are unremarkable.  Moderate gallbladder wall thickening and inflammatory changes. No CT findings of cholelithiasis or choledocholithiasis. The liver, spleen, pancreas and adrenal glands are unremarkable.  The stomach, small and large bowel are normal in course and caliber without inflammatory changes. A few scattered colonic diverticula seen. Normal appendix. No intraperitoneal free fluid nor free air.  Kidneys are orthotopic, demonstrating symmetric enhancement punctate nonobstructing right  interpolar renal calculus.  No hydronephrosis or renal masses. The unopacified ureters are normal in course and caliber. Delayed imaging through the kidneys demonstrates symmetric prompt excretion to the proximal urinary collecting system. Urinary bladder is partially distended and unremarkable.  The aortoiliac vessels are normal in course and caliber with moderate to severe intimal thickening and calcific atherosclerosis, no aneurysm. No lymphadenopathy by CT size criteria. Internal reproductive organs are unremarkable. Chronic bilateral L5 pars interarticularis defects without spondylolisthesis. Mild degenerative change of lumbar spine.  IMPRESSION: Gallbladder wall thickening and inflammatory changes consistent with acute cholecystitis.   Electronically Signed   By: Elon Alas   On: 11/02/2013 03:41   Dg Chest Port 1 View  11/02/2013   CLINICAL DATA:  Cough, congestion and shortness of breath.  EXAM: PORTABLE CHEST - 1 VIEW  COMPARISON:  Chest radiograph performed 08/28/2013  FINDINGS: The lungs are well-aerated. Vascular congestion is noted. Increased interstitial markings may reflect mild interstitial edema. No definite pleural effusion or pneumothorax is seen.  The cardiomediastinal silhouette is within normal limits. No acute osseous abnormalities are seen.  IMPRESSION: Vascular congestion noted. Increased interstitial markings may reflect mild interstitial edema.   Electronically Signed   By: Garald Balding M.D.   On: 11/02/2013 06:00   US Abdomen Limited Ruq  11/02/2013   CLINICAL DATA:  Abdominal pain.  EXAM: US ABDOMEN LIMITED - RIGHT UPPER QUADRANT  COMPARISON:  None.  FINDINGS: Gallbladder:  Multiple stones are seen dependently within the gallbladder, measuring up to 1.8 cm in size. There is suggestion of mild gallbladder wall thickening, but no pericholecystic fluid is seen, and no ultrasonographic Murphy's sign is elicited.  Common bile duct:  Diameter: 0.5 cm, within normal limits in  caliber.  Liver:  No focal lesion identified. Diffusely increased parenchymal echogenicity and coarsened echotexture, compatible with fatty infiltration.  IMPRESSION: 1. Cholelithiasis noted; suggestion of mild gallbladder wall thickening, without additional evidence to suggest cholecystitis or obstruction. No ultrasonographic Murphy's sign seen. 2. Diffuse fatty infiltration within the liver.   Electronically Signed   By: Garald Balding M.D.   On: 11/02/2013 05:35    Review of Systems  Constitutional: Positive for malaise/fatigue. Negative for fever, chills and weight loss.  HENT: Negative.   Respiratory: Positive for cough, shortness of breath and wheezing. Negative for hemoptysis and sputum production.   Cardiovascular: Negative.   Gastrointestinal: Positive for nausea and abdominal pain. Negative for vomiting, diarrhea, constipation and blood in stool.  Musculoskeletal: Positive for joint pain and myalgias.   Blood pressure 139/75, pulse 81, temperature 97.7 F (36.5 C), temperature source Oral, resp. rate 14, weight 267 lb 12.8 oz (121.473 kg), SpO2 97.00%. Physical Exam General: Alert, obese Caucasian male, in mild distress Skin: Warm and dry without rash or infection. HEENT: No palpable masses or thyromegaly. Sclera nonicteric. Pupils equal round and reactive. Oropharynx clear. Lymph nodes: No cervical, supraclavicular, or inguinal nodes palpable. Lungs: mild increased work of breathing with some pursed lip breathing. Mild scattered wheezing bilaterally Cardiovascular: Regular rate and rhythm without murmur. No JVD or edema. Peripheral pulses intact. Abdomen: obese.Nondistended. Localized right upper quadrant tenderness with guarding. No masses palpable. No organomegaly. No palpable hernias. Extremities: No edema or joint swelling or deformity. No chronic venous stasis changes. Neurologic: Alert and fully oriented.no gross motor deficits.  Assessment/Plan: Cholelithiasis and acute  cholecystitis. Laparoscopic cholecystectomy is the standard treatment. However the patient has severe COPD and what appeared to be at significantly increased risk of pulmonary complications perioperatively. An alternative would be percutaneous  drainage and nonoperative management. This however would risk further episodes in the future. Pulmonary evaluation is pending to help predict operative risk and I think the decision would have to be based in a large part on this evaluation. Patient is being appropriately managed with IV antibiotics and analgesics.  Tailyn Hantz T 11/02/2013, 7:23 AM

## 2013-11-02 NOTE — Progress Notes (Signed)
Called and received report form merle, rn in ED. Vwilliams,rn.

## 2013-11-02 NOTE — ED Notes (Signed)
Dr. Kakrakandy at bedside. 

## 2013-11-02 NOTE — Progress Notes (Addendum)
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 male with history of severe COPD, polycythemia secondary to COPD presented to the ER because of abdominal pain found to have cholecystitis   1. Acute cholecystitis; Awaiting pulmonology evaluation, celarance due to severe COPD -pend surgery vs percutaneous drain. appreciate surgery care; cont IVF, IV atx, NPO  2. COPD+active smoker, exam no wheezing; CXR: mild congestion; echo (2012)LVEF 55%, reduced RV function;  -cont inhalers prn, oxygen, pend pulmonology eval   3. Chronic respiratory failure on home oxygen; cont inhalers, oxygen    Code Status: full Family Communication: d/w patient, his wife (indicate person spoken with, relationship, and if by phone, the number) Disposition Plan: home pend clinical improvemkent   Consultants:  Surgery, pulmonology   Procedures:  None   Antibiotics:  Zosyn 4/2<<< (indicate start date, and stop date if known)  HPI/Subjective: alert  Objective: Filed Vitals:   11/02/13 0617  BP: 139/75  Pulse: 81  Temp: 97.7 F (36.5 C)  Resp: 14    Intake/Output Summary (Last 24 hours) at 11/02/13 1040 Last data filed at 11/02/13 0952  Gross per 24 hour  Intake      0 ml  Output    400 ml  Net   -400 ml   Filed Weights   11/02/13 0617  Weight: 121.473 kg (267 lb 12.8 oz)    Exam:   General:  alert  Cardiovascular: s1,s2 rrr  Respiratory: decreased AE in LL  Abdomen: soft, obese, mild tender  Musculoskeletal: no LE edema   Data Reviewed: Basic Metabolic Panel:  Recent Labs Lab 11/02/13 0200 11/02/13 0815  NA 136* 134*  K 4.2 4.2  CL 99 98  CO2 25 26  GLUCOSE 118* 114*  BUN 22 17  CREATININE 0.91 0.85   CALCIUM 9.4 9.1   Liver Function Tests:  Recent Labs Lab 11/02/13 0200 11/02/13 0815  AST 18 18  ALT 21 20  ALKPHOS 90 76  BILITOT 0.4 0.5  PROT 7.9 7.0  ALBUMIN 3.8 3.6    Recent Labs Lab 11/02/13 0200  LIPASE 17   No results found for this basename: AMMONIA,  in the last 168 hours CBC:  Recent Labs Lab 11/02/13 0200 11/02/13 0815  WBC 15.9* 12.5*  NEUTROABS  --  8.3*  HGB 16.5 14.5  HCT 47.0 43.3  MCV 93.4 92.7  PLT 322 316   Cardiac Enzymes: No results found for this basename: CKTOTAL, CKMB, CKMBINDEX, TROPONINI,  in the last 168 hours BNP (last 3 results)  Recent Labs  08/09/13 1550  PROBNP 222.0*   CBG:  Recent Labs Lab 11/02/13 0725  GLUCAP 113*    No results found for this or any previous visit (from the past 240 hour(s)).   Studies: Ct Abdomen Pelvis W Contrast  11/02/2013   CLINICAL DATA:  Abdominal pain, distention, nausea.  EXAM: CT ABDOMEN AND PELVIS WITH CONTRAST  TECHNIQUE: Multidetector CT imaging of the abdomen and pelvis was performed using the standard protocol following bolus administration of intravenous contrast.  CONTRAST:  122mL OMNIPAQUE IOHEXOL 300 MG/ML  SOLN  COMPARISON:  CT CHEST W/CM dated 04/27/2011  FINDINGS: Included view of the lung bases 4 mm pulmonary nodule in right middle lobe, axial 1/22, unchanged. Visualized heart and pericardium are unremarkable.  Moderate gallbladder wall thickening and inflammatory changes. No CT findings of cholelithiasis or choledocholithiasis. The liver, spleen, pancreas and adrenal glands are unremarkable.  The stomach, small and large bowel are normal in course and caliber without inflammatory changes. A few scattered colonic diverticula seen. Normal appendix. No intraperitoneal free fluid nor free air.  Kidneys are orthotopic, demonstrating symmetric enhancement punctate nonobstructing right interpolar renal calculus. No hydronephrosis or renal masses. The unopacified ureters are normal in course  and caliber. Delayed imaging through the kidneys demonstrates symmetric prompt excretion to the proximal urinary collecting system. Urinary bladder is partially distended and unremarkable.  The aortoiliac vessels are normal in course and caliber with moderate to severe intimal thickening and calcific atherosclerosis, no aneurysm. No lymphadenopathy by CT size criteria. Internal reproductive organs are unremarkable. Chronic bilateral L5 pars interarticularis defects without spondylolisthesis. Mild degenerative change of lumbar spine.  IMPRESSION: Gallbladder wall thickening and inflammatory changes consistent with acute cholecystitis.   Electronically Signed   By: Elon Alas   On: 11/02/2013 03:41   Dg Chest Port 1 View  11/02/2013   CLINICAL DATA:  Cough, congestion and shortness of breath.  EXAM: PORTABLE CHEST - 1 VIEW  COMPARISON:  Chest radiograph performed 08/28/2013  FINDINGS: The lungs are well-aerated. Vascular congestion is noted. Increased interstitial markings may reflect mild interstitial edema. No definite pleural effusion or pneumothorax is seen.  The cardiomediastinal silhouette is within normal limits. No acute osseous abnormalities are seen.  IMPRESSION: Vascular congestion noted. Increased interstitial markings may reflect mild interstitial edema.   Electronically Signed   By: Garald Balding M.D.   On: 11/02/2013 06:00   US Abdomen Limited Ruq  11/02/2013   CLINICAL DATA:  Abdominal pain.  EXAM: US ABDOMEN LIMITED - RIGHT UPPER QUADRANT  COMPARISON:  None.  FINDINGS: Gallbladder:  Multiple stones are seen dependently within the gallbladder, measuring up to 1.8 cm in size. There is suggestion of mild gallbladder wall thickening, but no pericholecystic fluid is seen, and no ultrasonographic Murphy's sign is elicited.  Common bile duct:  Diameter: 0.5 cm, within normal limits in caliber.  Liver:  No focal lesion identified. Diffusely increased parenchymal echogenicity and coarsened  echotexture, compatible with fatty infiltration.  IMPRESSION: 1. Cholelithiasis noted; suggestion of mild gallbladder wall thickening, without additional evidence to suggest cholecystitis or obstruction. No ultrasonographic Murphy's sign seen. 2. Diffuse fatty infiltration within the liver.   Electronically Signed   By: Garald Balding M.D.   On: 11/02/2013 05:35    Scheduled Meds: . mometasone-formoterol  2 puff Inhalation BID  . piperacillin-tazobactam (ZOSYN)  IV  3.375 g Intravenous 3 times per day  . tiotropium  18 mcg Inhalation Daily   Continuous Infusions: . 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 If 7PM-7AM, please contact night-coverage at [email or web address], password [facility] 11/02/2013, 10:40 AM  LOS: 0 days

## 2013-11-03 ENCOUNTER — Inpatient Hospital Stay (HOSPITAL_COMMUNITY): Payer: Medicare Other

## 2013-11-03 DIAGNOSIS — K8000 Calculus of gallbladder with acute cholecystitis without obstruction: Secondary | ICD-10-CM

## 2013-11-03 DIAGNOSIS — J441 Chronic obstructive pulmonary disease with (acute) exacerbation: Secondary | ICD-10-CM

## 2013-11-03 DIAGNOSIS — A419 Sepsis, unspecified organism: Secondary | ICD-10-CM

## 2013-11-03 DIAGNOSIS — F17200 Nicotine dependence, unspecified, uncomplicated: Secondary | ICD-10-CM

## 2013-11-03 LAB — GLUCOSE, CAPILLARY
GLUCOSE-CAPILLARY: 103 mg/dL — AB (ref 70–99)
GLUCOSE-CAPILLARY: 102 mg/dL — AB (ref 70–99)
Glucose-Capillary: 100 mg/dL — ABNORMAL HIGH (ref 70–99)
Glucose-Capillary: 102 mg/dL — ABNORMAL HIGH (ref 70–99)
Glucose-Capillary: 104 mg/dL — ABNORMAL HIGH (ref 70–99)
Glucose-Capillary: 89 mg/dL (ref 70–99)

## 2013-11-03 LAB — COMPREHENSIVE METABOLIC PANEL
ALT: 27 U/L (ref 0–53)
AST: 22 U/L (ref 0–37)
Albumin: 3.3 g/dL — ABNORMAL LOW (ref 3.5–5.2)
Alkaline Phosphatase: 69 U/L (ref 39–117)
BILIRUBIN TOTAL: 0.4 mg/dL (ref 0.3–1.2)
BUN: 15 mg/dL (ref 6–23)
CO2: 26 mEq/L (ref 19–32)
CREATININE: 0.86 mg/dL (ref 0.50–1.35)
Calcium: 8.7 mg/dL (ref 8.4–10.5)
Chloride: 102 mEq/L (ref 96–112)
GFR calc Af Amer: >90 mL/min (ref >90)
Glucose, Bld: 109 mg/dL — ABNORMAL HIGH (ref 70–99)
POTASSIUM: 4.6 meq/L (ref 3.7–5.3)
Sodium: 137 mEq/L (ref 137–147)
Total Protein: 6.7 g/dL (ref 6.0–8.3)

## 2013-11-03 LAB — MRSA PCR SCREENING: MRSA BY PCR: NEGATIVE

## 2013-11-03 LAB — CBC
HEMATOCRIT: 41.5 % (ref 39.0–52.0)
Hemoglobin: 14.1 g/dL (ref 13.0–17.0)
MCH: 31.9 pg (ref 26.0–34.0)
MCHC: 34 g/dL (ref 30.0–36.0)
MCV: 93.9 fL (ref 78.0–100.0)
Platelets: 309 10*3/uL (ref 150–400)
RBC: 4.42 MIL/uL (ref 4.22–5.81)
RDW: 14.1 % (ref 11.5–15.5)
WBC: 11.1 10*3/uL — ABNORMAL HIGH (ref 4.0–10.5)

## 2013-11-03 LAB — LIPASE, BLOOD: LIPASE: 15 U/L (ref 11–59)

## 2013-11-03 MED ORDER — ARFORMOTEROL TARTRATE 15 MCG/2ML IN NEBU
15.0000 ug | INHALATION_SOLUTION | Freq: Two times a day (BID) | RESPIRATORY_TRACT | Status: DC
  Administered 2013-11-03 – 2013-11-04 (×4): 15 ug via RESPIRATORY_TRACT
  Filled 2013-11-03 (×8): qty 2

## 2013-11-03 MED ORDER — SODIUM CHLORIDE 0.9 % IV SOLN
INTRAVENOUS | Status: DC
  Administered 2013-11-03: 12:00:00 via INTRAVENOUS

## 2013-11-03 MED ORDER — HYDROMORPHONE HCL PF 2 MG/ML IJ SOLN
INTRAMUSCULAR | Status: AC
  Administered 2013-11-03: 1 mg
  Filled 2013-11-03: qty 1

## 2013-11-03 MED ORDER — IOHEXOL 300 MG/ML  SOLN
150.0000 mL | Freq: Once | INTRAMUSCULAR | Status: AC | PRN
  Administered 2013-11-03: 10 mL via INTRAVENOUS

## 2013-11-03 MED ORDER — MIDAZOLAM HCL 2 MG/2ML IJ SOLN
INTRAMUSCULAR | Status: AC
  Filled 2013-11-03: qty 4

## 2013-11-03 MED ORDER — BUDESONIDE 0.5 MG/2ML IN SUSP
0.5000 mg | Freq: Two times a day (BID) | RESPIRATORY_TRACT | Status: DC
  Administered 2013-11-03 – 2013-11-04 (×4): 0.5 mg via RESPIRATORY_TRACT
  Filled 2013-11-03 (×11): qty 2

## 2013-11-03 MED ORDER — LIDOCAINE HCL 1 % IJ SOLN
INTRAMUSCULAR | Status: AC
  Filled 2013-11-03: qty 20

## 2013-11-03 MED ORDER — SENNOSIDES-DOCUSATE SODIUM 8.6-50 MG PO TABS
1.0000 | ORAL_TABLET | Freq: Two times a day (BID) | ORAL | Status: DC
  Administered 2013-11-03 – 2013-11-05 (×4): 1 via ORAL
  Filled 2013-11-03 (×6): qty 1

## 2013-11-03 MED ORDER — FENTANYL CITRATE 0.05 MG/ML IJ SOLN
INTRAMUSCULAR | Status: AC | PRN
  Administered 2013-11-03: 50 ug via INTRAVENOUS

## 2013-11-03 MED ORDER — POLYETHYLENE GLYCOL 3350 17 G PO PACK
17.0000 g | PACK | Freq: Every day | ORAL | Status: DC | PRN
  Filled 2013-11-03: qty 1

## 2013-11-03 MED ORDER — HYDROMORPHONE HCL PF 1 MG/ML IJ SOLN: 1.0000 mg | Freq: Once | INTRAMUSCULAR | Status: AC

## 2013-11-03 MED ORDER — IPRATROPIUM-ALBUTEROL 0.5-2.5 (3) MG/3ML IN SOLN
3.0000 mL | Freq: Once | RESPIRATORY_TRACT | Status: AC
  Administered 2013-11-03: 3 mL via RESPIRATORY_TRACT
  Filled 2013-11-03: qty 3

## 2013-11-03 MED ORDER — FENTANYL CITRATE 0.05 MG/ML IJ SOLN
INTRAMUSCULAR | Status: AC
Start: 2013-11-03 — End: 2013-11-03
  Filled 2013-11-03: qty 4

## 2013-11-03 MED ORDER — HYDROMORPHONE HCL PF 1 MG/ML IJ SOLN
1.0000 mg | INTRAMUSCULAR | Status: DC | PRN
  Administered 2013-11-03 – 2013-11-04 (×5): 1 mg via INTRAVENOUS
  Filled 2013-11-03 (×5): qty 1

## 2013-11-03 MED ORDER — MIDAZOLAM HCL 2 MG/2ML IJ SOLN
INTRAMUSCULAR | Status: AC | PRN
  Administered 2013-11-03: 1 mg via INTRAVENOUS

## 2013-11-03 NOTE — Progress Notes (Signed)
ANTIBIOTIC CONSULT NOTE - FOLLOW UP  Pharmacy Consult for zosyn Indication: Intra-abdominal infection  No Known Allergies  Patient Measurements: Weight: 267 lb 12.8 oz (121.473 kg) Adjusted Body Weight:   Vital Signs: Temp: 97.7 F (36.5 C) (04/03 0547) Temp src: Oral (04/03 0547) BP: 123/77 mmHg (04/03 0547) Pulse Rate: 70 (04/03 0547) Intake/Output from previous day: 04/02 0701 - 04/03 0700 In: 1150 [I.V.:1100; IV Piggyback:50] Out: 1250 [Urine:1250] Intake/Output from this shift:    Labs:  Recent Labs  11/02/13 0200 11/02/13 0815 11/03/13 0521  WBC 15.9* 12.5* 11.1*  HGB 16.5 14.5 14.1  PLT 322 316 309  CREATININE 0.91 0.85 0.86   The CrCl is unknown because both a height and weight (above a minimum accepted value) are required for this calculation. No results found for this basename: VANCOTROUGH, VANCOPEAK, VANCORANDOM, GENTTROUGH, GENTPEAK, GENTRANDOM, TOBRATROUGH, TOBRAPEAK, TOBRARND, AMIKACINPEAK, AMIKACINTROU, AMIKACIN,  in the last 72 hours   Microbiology: No results found for this or any previous visit (from the past 720 hour(s)).  Anti-infectives   Start     Dose/Rate Route Frequency Ordered Stop   11/02/13 1400  piperacillin-tazobactam (ZOSYN) IVPB 3.375 g     3.375 g 12.5 mL/hr over 240 Minutes Intravenous 3 times per day 11/02/13 0602     11/02/13 0500  piperacillin-tazobactam (ZOSYN) IVPB 3.375 g  Status:  Discontinued     3.375 g 12.5 mL/hr over 240 Minutes Intravenous  Once 11/02/13 0449 11/02/13 0455   11/02/13 0500  piperacillin-tazobactam (ZOSYN) IVPB 3.375 g     3.375 g 100 mL/hr over 30 Minutes Intravenous  Once 11/02/13 0455 11/02/13 0535      Assessment: 49 YOM started on zosyn for cholecystitis. He has severe COPD and being evaluated re: surgical risk - pulmonary consultant felt high risk for post-op pulmonary and plan for IR to place percutaneous tube 4/3.  11/02/2013 >> zosyn >>  Tmax: afeb WBCs: 11.1 Renal: Scr = 0.86 for est  CrCl >147ml/min  No culture data   Goal of Therapy:  Dose appropriately for renal function  Plan:   Continue zosyn 3.375gm IV q8h over 4h infusion  Pharmacy to follow at distance, will only write note if change in dose required  Thank you,  Doreene Eland, PharmD, BCPS.   Pager: [phone]  11/03/2013,9:49 AM

## 2013-11-03 NOTE — Progress Notes (Signed)
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 male with history of severe COPD, polycythemia secondary to COPD presented to the ER because of abdominal pain found to have cholecystitis   1. Acute cholecystitis; patient is at high risk surgery due to COPD/respiratory failure, evaluated by pulmonology; per surgery patient preferred percutaneous drain.  -appreciate surgery care; cont IVF, IV atx, NPO; diet post procedure   2. COPD+active smoker, exam no wheezing; CXR: mild congestion; echo (2012)LVEF 55%, reduced RV function;  -cont inhalers prn, oxygen  3. Chronic respiratory failure on home oxygen; cont inhalers, oxygen    Code Status: full Family Communication: d/w patient, his wife (indicate person spoken with, relationship, and if by phone, the number) Disposition Plan: home pend clinical improvemkent   Consultants:  Surgery, pulmonology   Procedures:  None   Antibiotics:  Zosyn 4/2<<< (indicate start date, and stop date if known)  HPI/Subjective: alert  Objective: Filed Vitals:   11/03/13 1009  BP: 135/76  Pulse: 78  Temp:   Resp: 24    Intake/Output Summary (Last 24 hours) at 11/03/13 1015 Last data filed at 11/03/13 0548  Gross per 24 hour  Intake   1150 ml  Output    850 ml  Net    300 ml   Filed Weights   11/02/13 0617  Weight: 121.473 kg (267 lb 12.8 oz)    Exam:   General:  alert  Cardiovascular: s1,s2 rrr  Respiratory: decreased AE in LL  Abdomen: soft, obese, mild tender  Musculoskeletal: no LE edema   Data Reviewed: Basic Metabolic Panel:  Recent Labs Lab 11/02/13 0200 11/02/13 0815 11/03/13 0521  NA 136* 134* 137  K 4.2 4.2 4.6  CL 99 98 102  CO2 25 26 26    GLUCOSE 118* 114* 109*  BUN 22 17 15   CREATININE 0.91 0.85 0.86  CALCIUM 9.4 9.1 8.7   Liver Function Tests:  Recent Labs Lab 11/02/13 0200 11/02/13 0815 11/03/13 0521  AST 18 18 22   ALT 21 20 27   ALKPHOS 90 76 69  BILITOT 0.4 0.5 0.4  PROT 7.9 7.0 6.7  ALBUMIN 3.8 3.6 3.3*    Recent Labs Lab 11/02/13 0200 11/03/13 0521  LIPASE 17 15   No results found for this basename: AMMONIA,  in the last 168 hours CBC:  Recent Labs Lab 11/02/13 0200 11/02/13 0815 11/03/13 0521  WBC 15.9* 12.5* 11.1*  NEUTROABS  --  8.3*  --   HGB 16.5 14.5 14.1  HCT 47.0 43.3 41.5  MCV 93.4 92.7 93.9  PLT 322 316 309   Cardiac Enzymes: No results found for this basename: CKTOTAL, CKMB, CKMBINDEX, TROPONINI,  in the last 168 hours BNP (last 3 results)  Recent Labs  08/09/13 1550  PROBNP 222.0*   CBG:  Recent Labs Lab 11/02/13 1123 11/02/13 1625 11/02/13 2008 11/02/13 2357 11/03/13 0349  GLUCAP 93 89 100* 104* 103*    No results found for this or any previous visit (from the past 240 hour(s)).   Studies: Ct Abdomen Pelvis W Contrast  11/02/2013   CLINICAL DATA:  Abdominal pain, distention, nausea.  EXAM: CT ABDOMEN AND PELVIS WITH CONTRAST  TECHNIQUE: Multidetector CT imaging of the abdomen and pelvis was performed using the standard protocol following bolus administration of  intravenous contrast.  CONTRAST:  175mL OMNIPAQUE IOHEXOL 300 MG/ML  SOLN  COMPARISON:  CT CHEST W/CM dated 04/27/2011  FINDINGS: Included view of the lung bases 4 mm pulmonary nodule in right middle lobe, axial 1/22, unchanged. Visualized heart and pericardium are unremarkable.  Moderate gallbladder wall thickening and inflammatory changes. No CT findings of cholelithiasis or choledocholithiasis. The liver, spleen, pancreas and adrenal glands are unremarkable.  The stomach, small and large bowel are normal in course and caliber without inflammatory changes. A few scattered colonic diverticula seen. Normal  appendix. No intraperitoneal free fluid nor free air.  Kidneys are orthotopic, demonstrating symmetric enhancement punctate nonobstructing right interpolar renal calculus. No hydronephrosis or renal masses. The unopacified ureters are normal in course and caliber. Delayed imaging through the kidneys demonstrates symmetric prompt excretion to the proximal urinary collecting system. Urinary bladder is partially distended and unremarkable.  The aortoiliac vessels are normal in course and caliber with moderate to severe intimal thickening and calcific atherosclerosis, no aneurysm. No lymphadenopathy by CT size criteria. Internal reproductive organs are unremarkable. Chronic bilateral L5 pars interarticularis defects without spondylolisthesis. Mild degenerative change of lumbar spine.  IMPRESSION: Gallbladder wall thickening and inflammatory changes consistent with acute cholecystitis.   Electronically Signed   By: Elon Alas   On: 11/02/2013 03:41   Dg Chest Port 1 View  11/02/2013   CLINICAL DATA:  Cough, congestion and shortness of breath.  EXAM: PORTABLE CHEST - 1 VIEW  COMPARISON:  Chest radiograph performed 08/28/2013  FINDINGS: The lungs are well-aerated. Vascular congestion is noted. Increased interstitial markings may reflect mild interstitial edema. No definite pleural effusion or pneumothorax is seen.  The cardiomediastinal silhouette is within normal limits. No acute osseous abnormalities are seen.  IMPRESSION: Vascular congestion noted. Increased interstitial markings may reflect mild interstitial edema.   Electronically Signed   By: Garald Balding M.D.   On: 11/02/2013 06:00   US Abdomen Limited Ruq  11/02/2013   CLINICAL DATA:  Abdominal pain.  EXAM: US ABDOMEN LIMITED - RIGHT UPPER QUADRANT  COMPARISON:  None.  FINDINGS: Gallbladder:  Multiple stones are seen dependently within the gallbladder, measuring up to 1.8 cm in size. There is suggestion of mild gallbladder wall thickening, but no  pericholecystic fluid is seen, and no ultrasonographic Murphy's sign is elicited.  Common bile duct:  Diameter: 0.5 cm, within normal limits in caliber.  Liver:  No focal lesion identified. Diffusely increased parenchymal echogenicity and coarsened echotexture, compatible with fatty infiltration.  IMPRESSION: 1. Cholelithiasis noted; suggestion of mild gallbladder wall thickening, without additional evidence to suggest cholecystitis or obstruction. No ultrasonographic Murphy's sign seen. 2. Diffuse fatty infiltration within the liver.   Electronically Signed   By: Garald Balding M.D.   On: 11/02/2013 05:35    Scheduled Meds: . fentaNYL      . lidocaine      . midazolam      . mometasone-formoterol  2 puff Inhalation BID  . piperacillin-tazobactam (ZOSYN)  IV  3.375 g Intravenous 3 times per day  . 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 If 7PM-7AM, please contact night-coverage at [email or web address], password [facility] 11/03/2013, 10:15 AM  LOS: 1 day

## 2013-11-03 NOTE — Sedation Documentation (Signed)
After procedure completed patient had increased pain at site and had difficulty catching his breath.  Nasal cannula exchanged for a simple mask and O2 increased.  Dr. Barbie Banner called Dr. Elsworth Soho who recommended patient be moved to the ICU.  ICU charge RN called, and Floor RN notified and asked to call report to ICU RN.

## 2013-11-03 NOTE — Progress Notes (Signed)
Acute cholecystitis  Subjective: Pt feels about the same today.    Objective: Vital signs in last 24 hours: Temp:  [97.7 F (36.5 C)-98.7 F (37.1 C)] 97.7 F (36.5 C) (04/03 0547) Pulse Rate:  [70-84] 70 (04/03 0547) Resp:  [16-18] 18 (04/03 0547) BP: (106-134)/(64-77) 123/77 mmHg (04/03 0547) SpO2:  [94 %-98 %] 98 % (04/03 0643) Last BM Date: 11/01/13  Intake/Output from previous day: 04/02 0701 - 04/03 0700 In: 1150 [I.V.:1100; IV Piggyback:50] Out: 1250 [Urine:1250] Intake/Output this shift:    General appearance: alert and cooperative GI: RUQ tenderness  Lab Results:  Results for orders placed during the hospital encounter of 11/02/13 (from the past 24 hour(s))  GLUCOSE, CAPILLARY     Status: None   Collection Time    11/02/13 11:23 AM      Result Value Ref Range   Glucose-Capillary 93  70 - 99 mg/dL   Comment 1 Notify RN    GLUCOSE, CAPILLARY     Status: None   Collection Time    11/02/13  4:25 PM      Result Value Ref Range   Glucose-Capillary 89  70 - 99 mg/dL   Comment 1 Notify RN    GLUCOSE, CAPILLARY     Status: Abnormal   Collection Time    11/02/13  8:08 PM      Result Value Ref Range   Glucose-Capillary 100 (*) 70 - 99 mg/dL   Comment 1 Notify RN    GLUCOSE, CAPILLARY     Status: Abnormal   Collection Time    11/02/13 11:57 PM      Result Value Ref Range   Glucose-Capillary 104 (*) 70 - 99 mg/dL   Comment 1 Notify RN    GLUCOSE, CAPILLARY     Status: Abnormal   Collection Time    11/03/13  3:49 AM      Result Value Ref Range   Glucose-Capillary 103 (*) 70 - 99 mg/dL   Comment 1 Notify RN    CBC     Status: Abnormal   Collection Time    11/03/13  5:21 AM      Result Value Ref Range   WBC 11.1 (*) 4.0 - 10.5 K/uL   RBC 4.42  4.22 - 5.81 MIL/uL   Hemoglobin 14.1  13.0 - 17.0 g/dL   HCT 41.5  39.0 - 52.0 %   MCV 93.9  78.0 - 100.0 fL   MCH 31.9  26.0 - 34.0 pg   MCHC 34.0  30.0 - 36.0 g/dL   RDW 14.1  11.5 - 15.5 %   Platelets 309  150  - 400 K/uL  COMPREHENSIVE METABOLIC PANEL     Status: Abnormal   Collection Time    11/03/13  5:21 AM      Result Value Ref Range   Sodium 137  137 - 147 mEq/L   Potassium 4.6  3.7 - 5.3 mEq/L   Chloride 102  96 - 112 mEq/L   CO2 26  19 - 32 mEq/L   Glucose, Bld 109 (*) 70 - 99 mg/dL   BUN 15  6 - 23 mg/dL   Creatinine, Ser 0.86  0.50 - 1.35 mg/dL   Calcium 8.7  8.4 - 10.5 mg/dL   Total Protein 6.7  6.0 - 8.3 g/dL   Albumin 3.3 (*) 3.5 - 5.2 g/dL   AST 22  0 - 37 U/L   ALT 27  0 - 53 U/L   Alkaline  Phosphatase 69  39 - 117 U/L   Total Bilirubin 0.4  0.3 - 1.2 mg/dL   GFR calc non Af Amer >90  >90 mL/min   GFR calc Af Amer >90  >90 mL/min  LIPASE, BLOOD     Status: None   Collection Time    11/03/13  5:21 AM      Result Value Ref Range   Lipase 15  11 - 59 U/L     Studies/Results Radiology     MEDS, Scheduled . mometasone-formoterol  2 puff Inhalation BID  . piperacillin-tazobactam (ZOSYN)  IV  3.375 g Intravenous 3 times per day  . tiotropium  18 mcg Inhalation Daily     Assessment: Acute cholecystitis End stage, O2 dependent COPD  Plan: Perc cholecystostomy drain per IR today Cont abx OK to advance diet slowly after drain placement   LOS: 1 day    Rosario Adie, MD [facility] Surgery, [place]   11/03/2013 8:50 AM

## 2013-11-03 NOTE — Consult Note (Signed)
PULMONARY / CRITICAL CARE MEDICINE   Name: Allen Green MRN: [id number] DOB: 10/10/1950    ADMISSION DATE:  11/02/2013 CONSULTATION DATE: 11/02/2013  REFERRING MD :  CCS  CHIEF COMPLAINT:  Abd pain  BRIEF PATIENT DESCRIPTION:  63 yo male smoker admitted with abdominal pain from cholecystitis.  Followed by Dr. Lamonte Sakai for COPD on home oxygen (baseline FEV1 1.61 (48%) from June 2012).  PCCM consulted for pre-op pulmonary assessment.  SIGNIFICANT EVENTS: 4/2 Admit, surgery, PCCM consult 4/3 GB drain placed by IR >> transfer to SDU due to respiratory distress from abd pain with splinting  STUDIES:  4/2 CT abd/pelvis >> Gallbladder wall thickening and inflammatory changes consistent with acute cholecystitis. 4/2 Abd u/s >> cholelithiasis, cholecystitis, fatty liver  LINES / TUBES: PIV  CULTURES: Gallbladder fluid 4/3 >>  ANTIBIOTICS: Zosyn 4/1 >>   SUBJECTIVE:  Had perc drain this AM.  Developed worsening abd pain after procedure causing splinting with decreased respiratory excursion.  Pt reports that morphine is like giving him water, and only dilaudid helps with his pain when he is sick like this.  VITAL SIGNS: Temp:  [97.7 F (36.5 C)-98.7 F (37.1 C)] 97.7 F (36.5 C) (04/03 0547) Pulse Rate:  [70-88] 88 (04/03 1040) Resp:  [10-26] 20 (04/03 1040) BP: (90-135)/(64-78) 123/78 mmHg (04/03 1040) SpO2:  [73 %-99 %] 99 % (04/03 1050) FiO2 (%):  [45 %-100 %] 45 % (04/03 1105) INTAKE / OUTPUT: Intake/Output     04/02 0701 - 04/03 0700 04/03 0701 - 04/04 0700   I.V. (mL/kg) 1100 (9.1)    IV Piggyback 50    Total Intake(mL/kg) 1150 (9.5)    Urine (mL/kg/hr) 1250 (0.4)    Total Output 1250     Net -100            PHYSICAL EXAMINATION: General: Ill appearing, anxious Neuro: Awake, follows commands, normal strength HEENT: no sinus tenderness Cardiovascular: regular Lungs: decreased respiratory excursion, faint b/l wheeze Abdomen: soft, tender RUQ, drain in RUQ, decreased  bowel sounds Musculoskeletal: no edema Skin: no rashes  LABS: CBC Recent Labs     11/02/13  0200  11/02/13  0815  11/03/13  0521  WBC  15.9*  12.5*  11.1*  HGB  16.5  14.5  14.1  HCT  47.0  43.3  41.5  PLT  322  316  309    Coag's Recent Labs     11/02/13  0815  INR  1.01    BMET Recent Labs     11/02/13  0200  11/02/13  0815  11/03/13  0521  NA  136*  134*  137  K  4.2  4.2  4.6  CL  99  98  102  CO2  25  26  26   BUN  22  17  15   CREATININE  0.91  0.85  0.86  GLUCOSE  118*  114*  109*    Electrolytes Recent Labs     11/02/13  0200  11/02/13  0815  11/03/13  0521  CALCIUM  9.4  9.1  8.7   Liver Enzymes Recent Labs     11/02/13  0200  11/02/13  0815  11/03/13  0521  AST  18  18  22   ALT  21  20  27   ALKPHOS  90  76  69  BILITOT  0.4  0.5  0.4  ALBUMIN  3.8  3.6  3.3*   Glucose Recent Labs     11/02/13  0725  11/02/13  1123  11/02/13  1625  11/02/13  2008  11/02/13  2357  11/03/13  0349  GLUCAP  113*  93  89  100*  104*  103*    Imaging Ct Abdomen Pelvis W Contrast  11/02/2013   CLINICAL DATA:  Abdominal pain, distention, nausea.  EXAM: CT ABDOMEN AND PELVIS WITH CONTRAST  TECHNIQUE: Multidetector CT imaging of the abdomen and pelvis was performed using the standard protocol following bolus administration of intravenous contrast.  CONTRAST:  151mL OMNIPAQUE IOHEXOL 300 MG/ML  SOLN  COMPARISON:  CT CHEST W/CM dated 04/27/2011  FINDINGS: Included view of the lung bases 4 mm pulmonary nodule in right middle lobe, axial 1/22, unchanged. Visualized heart and pericardium are unremarkable.  Moderate gallbladder wall thickening and inflammatory changes. No CT findings of cholelithiasis or choledocholithiasis. The liver, spleen, pancreas and adrenal glands are unremarkable.  The stomach, small and large bowel are normal in course and caliber without inflammatory changes. A few scattered colonic diverticula seen. Normal appendix. No intraperitoneal free  fluid nor free air.  Kidneys are orthotopic, demonstrating symmetric enhancement punctate nonobstructing right interpolar renal calculus. No hydronephrosis or renal masses. The unopacified ureters are normal in course and caliber. Delayed imaging through the kidneys demonstrates symmetric prompt excretion to the proximal urinary collecting system. Urinary bladder is partially distended and unremarkable.  The aortoiliac vessels are normal in course and caliber with moderate to severe intimal thickening and calcific atherosclerosis, no aneurysm. No lymphadenopathy by CT size criteria. Internal reproductive organs are unremarkable. Chronic bilateral L5 pars interarticularis defects without spondylolisthesis. Mild degenerative change of lumbar spine.  IMPRESSION: Gallbladder wall thickening and inflammatory changes consistent with acute cholecystitis.   Electronically Signed   By: Elon Alas   On: 11/02/2013 03:41   Dg Chest Port 1 View  11/02/2013   CLINICAL DATA:  Cough, congestion and shortness of breath.  EXAM: PORTABLE CHEST - 1 VIEW  COMPARISON:  Chest radiograph performed 08/28/2013  FINDINGS: The lungs are well-aerated. Vascular congestion is noted. Increased interstitial markings may reflect mild interstitial edema. No definite pleural effusion or pneumothorax is seen.  The cardiomediastinal silhouette is within normal limits. No acute osseous abnormalities are seen.  IMPRESSION: Vascular congestion noted. Increased interstitial markings may reflect mild interstitial edema.   Electronically Signed   By: Garald Balding M.D.   On: 11/02/2013 06:00   US Abdomen Limited Ruq  11/02/2013   CLINICAL DATA:  Abdominal pain.  EXAM: US ABDOMEN LIMITED - RIGHT UPPER QUADRANT  COMPARISON:  None.  FINDINGS: Gallbladder:  Multiple stones are seen dependently within the gallbladder, measuring up to 1.8 cm in size. There is suggestion of mild gallbladder wall thickening, but no pericholecystic fluid is seen, and no  ultrasonographic Murphy's sign is elicited.  Common bile duct:  Diameter: 0.5 cm, within normal limits in caliber.  Liver:  No focal lesion identified. Diffusely increased parenchymal echogenicity and coarsened echotexture, compatible with fatty infiltration.  IMPRESSION: 1. Cholelithiasis noted; suggestion of mild gallbladder wall thickening, without additional evidence to suggest cholecystitis or obstruction. No ultrasonographic Murphy's sign seen. 2. Diffuse fatty infiltration within the liver.   Electronically Signed   By: Garald Balding M.D.   On: 11/02/2013 05:35    ASSESSMENT / PLAN:  PULMONARY A:  Acute respiratory distress 4/03 after perc drain inserted >> likely from Abdominal pain and splinting. Hx of COPD on home oxygen with mild exacerbation. Tobacco abuse. Possible sleep apnea. P:   Defer addition  of systemic steroids for now Change dulera to nebulized brovana/pulmicort for now Continue schedule spiriva PRN albuterol PRN BiPAP Oxygen to keep SpO2 > 90% Bronchial hygiene F/u CXR  A: Acute cholecystitis s/p perc drain 4/03. P: Per primary team, CCS, IR Day 3 of zosyn  A: DVT prevention. P: SCD's Add heparin when okay with IR  Updated family at bedside.  Chesley Mires, MD [facility] Pulmonary/Critical Care 11/03/2013, 11:26 AM Pager:  [phone] After 3pm call: [phone]

## 2013-11-03 NOTE — Procedures (Signed)
10 Fr Chole drain Cystic duct is occluded.  No comp

## 2013-11-04 ENCOUNTER — Inpatient Hospital Stay (HOSPITAL_COMMUNITY): Payer: Medicare Other

## 2013-11-04 LAB — GLUCOSE, CAPILLARY
GLUCOSE-CAPILLARY: 101 mg/dL — AB (ref 70–99)
GLUCOSE-CAPILLARY: 113 mg/dL — AB (ref 70–99)
GLUCOSE-CAPILLARY: 121 mg/dL — AB (ref 70–99)
GLUCOSE-CAPILLARY: 91 mg/dL (ref 70–99)
GLUCOSE-CAPILLARY: 99 mg/dL (ref 70–99)
Glucose-Capillary: 100 mg/dL — ABNORMAL HIGH (ref 70–99)

## 2013-11-04 LAB — COMPREHENSIVE METABOLIC PANEL
ALK PHOS: 73 U/L (ref 39–117)
ALT: 31 U/L (ref 0–53)
AST: 28 U/L (ref 0–37)
Albumin: 3.6 g/dL (ref 3.5–5.2)
BUN: 16 mg/dL (ref 6–23)
CO2: 28 meq/L (ref 19–32)
CREATININE: 0.89 mg/dL (ref 0.50–1.35)
Calcium: 9 mg/dL (ref 8.4–10.5)
Chloride: 101 mEq/L (ref 96–112)
GFR calc Af Amer: >90 mL/min (ref >90)
GFR, EST NON AFRICAN AMERICAN: 90 mL/min — AB (ref >90)
Glucose, Bld: 103 mg/dL — ABNORMAL HIGH (ref 70–99)
POTASSIUM: 4.6 meq/L (ref 3.7–5.3)
Sodium: 138 mEq/L (ref 137–147)
Total Bilirubin: 0.4 mg/dL (ref 0.3–1.2)
Total Protein: 7.4 g/dL (ref 6.0–8.3)

## 2013-11-04 LAB — CBC
HCT: 42.6 % (ref 39.0–52.0)
Hemoglobin: 13.9 g/dL (ref 13.0–17.0)
MCH: 31.4 pg (ref 26.0–34.0)
MCHC: 32.6 g/dL (ref 30.0–36.0)
MCV: 96.2 fL (ref 78.0–100.0)
PLATELETS: 320 10*3/uL (ref 150–400)
RBC: 4.43 MIL/uL (ref 4.22–5.81)
RDW: 13.9 % (ref 11.5–15.5)
WBC: 13.9 10*3/uL — ABNORMAL HIGH (ref 4.0–10.5)

## 2013-11-04 MED ORDER — CALCIUM CARBONATE ANTACID 500 MG PO CHEW: 500.0000 mg | CHEWABLE_TABLET | Freq: Four times a day (QID) | ORAL | Status: DC | PRN

## 2013-11-04 NOTE — Progress Notes (Signed)
Subjective: Perc Cholecystostomy placed 4/3 Pt feeling better Up in chair No complaints  Objective: Vital signs in last 24 hours: Temp:  [98.1 F (36.7 C)-98.2 F (36.8 C)] 98.1 F (36.7 C) (04/04 0553) Pulse Rate:  [76-89] 76 (04/04 0553) Resp:  [12-20] 20 (04/04 0553) BP: (122-168)/(72-89) 122/75 mmHg (04/04 0553) SpO2:  [91 %-96 %] 94 % (04/04 0926) FiO2 (%):  [35 %] 35 % (04/03 1119) Last BM Date: 11/01/13  Intake/Output from previous day: 04/03 0701 - 04/04 0700 In: 264.2 [P.O.:240; I.V.:24.2] Out: 825 [Urine:625; Drains:200] Intake/Output this shift:    PE:  Afeb; vss Wbc 13.9 (11) Up in chair; pleasant Drain intact Output 200 cc yesterday 200 cc in bag now: bilious   Lab Results:   Recent Labs  11/03/13 0521 11/04/13 0525  WBC 11.1* 13.9*  HGB 14.1 13.9  HCT 41.5 42.6  PLT 309 320   BMET  Recent Labs  11/03/13 0521 11/04/13 0525  NA 137 138  K 4.6 4.6  CL 102 101  CO2 26 28  GLUCOSE 109* 103*  BUN 15 16  CREATININE 0.86 0.89  CALCIUM 8.7 9.0   PT/INR  Recent Labs  11/02/13 0815  LABPROT 13.1  INR 1.01   ABG No results found for this basename: PHART, PCO2, PO2, HCO3,  in the last 72 hours  Studies/Results: Ir Perc Cholecystostomy  11/03/2013   CLINICAL DATA:  Acute cholecystitis  EXAM: CHOLECYSTOSTOMY  FLUOROSCOPY TIME:  48 seconds.  MEDICATIONS AND MEDICAL HISTORY: Versed 1 mg, Fentanyl 50 mcg.  Additional Medications: None  ANESTHESIA/SEDATION: Moderate sedation time: 30 minutes  CONTRAST:  10 cc Omnipaque 300  PROCEDURE: The procedure, risks, benefits, and alternatives were explained to the patient. Questions regarding the procedure were encouraged and answered. The patient understands and consents to the procedure.  The right upper quadrant was prepped with Betadine in a sterile fashion, and a sterile drape was applied covering the operative field. A sterile gown and sterile gloves were used for the procedure.  A 21 gauge needle  was inserted into the gallbladder lumen under sonographic guidance and via transhepatic approach. It was removed over a 018 wire, which was upsized to a 3-J. A 10-French drain was advanced over the wire and coiled in the gallbladder lumen. It was sewn to the skin after being string fixed.  FINDINGS: The cystic duct is occluded.  COMPLICATIONS: None  IMPRESSION: Successful cholecystostomy. This needs to remain in place at least 6 weeks.   Electronically Signed   By: Maryclare Bean M.D.   On: 11/03/2013 15:18   Dg Chest Port 1 View  11/04/2013   CLINICAL DATA:  Followup COPD  EXAM: PORTABLE CHEST - 1 VIEW  COMPARISON:  11/03/2013  FINDINGS: Cardiac shadow is stable. Mild interstitial changes are again identified bilaterally without focal confluent infiltrate. This likely represents a component of interstitial edema. No other focal abnormality is seen.  IMPRESSION: Stable appearance of the chest when compare with the previous day.   Electronically Signed   By: Inez Catalina M.D.   On: 11/04/2013 07:30   Dg Chest Port 1 View  11/03/2013   CLINICAL DATA:  Respiratory distress.  EXAM: PORTABLE CHEST - 1 VIEW  COMPARISON:  11/02/2013  FINDINGS: The cardiomediastinal silhouette is within normal limits. The patient has taken a shallower inspiration than on the prior study. There is mild pulmonary vascular congestion, similar prior. Diffusely increased interstitial markings are unchanged. There is no evidence of segmental airspace consolidation, pleural effusion,  or pneumothorax. No acute osseous abnormality is identified.  IMPRESSION: Shallow lung inflation with persistent pulmonary vascular congestion and increased interstitial markings, possibly mild edema.   Electronically Signed   By: Logan Bores   On: 11/03/2013 11:38    Anti-infectives: Anti-infectives   Start     Dose/Rate Route Frequency Ordered Stop   11/02/13 1400  piperacillin-tazobactam (ZOSYN) IVPB 3.375 g     3.375 g 12.5 mL/hr over 240 Minutes  Intravenous 3 times per day 11/02/13 0602     11/02/13 0500  piperacillin-tazobactam (ZOSYN) IVPB 3.375 g  Status:  Discontinued     3.375 g 12.5 mL/hr over 240 Minutes Intravenous  Once 11/02/13 0449 11/02/13 0455   11/02/13 0500  piperacillin-tazobactam (ZOSYN) IVPB 3.375 g     3.375 g 100 mL/hr over 30 Minutes Intravenous  Once 11/02/13 0455 11/02/13 0535      Assessment/Plan: s/p * No surgery found *  Perc chole drain placed 4/3 Remain x 6 weeks unless go to [place] per surgery   LOS: 2 days    Syvilla Martin A 11/04/2013

## 2013-11-04 NOTE — Progress Notes (Signed)
Acute cholecystitis  Subjective: Pt feels much better after drain placement.      Objective: Vital signs in last 24 hours: Temp:  [98.1 F (36.7 C)-98.2 F (36.8 C)] 98.1 F (36.7 C) (04/04 0553) Pulse Rate:  [76-89] 76 (04/04 0553) Resp:  [10-26] 20 (04/04 0553) BP: (90-168)/(65-92) 122/75 mmHg (04/04 0553) SpO2:  [73 %-99 %] 94 % (04/04 0553) FiO2 (%):  [35 %-100 %] 35 % (04/03 1119) Weight:  [270 lb 4.5 oz (122.6 kg)] 270 lb 4.5 oz (122.6 kg) (04/03 1100) Last BM Date: 11/01/13  Intake/Output from previous day: 04/03 0701 - 04/04 0700 In: 264.2 [P.O.:240; I.V.:24.2] Out: 825 [Urine:625; Drains:200] Intake/Output this shift:    General appearance: alert and cooperative GI: RUQ tenderness better Drain with 228ml out, bilious and non-cloudy  Lab Results:  Results for orders placed during the hospital encounter of 11/02/13 (from the past 24 hour(s))  MRSA PCR SCREENING     Status: None   Collection Time    11/03/13 11:04 AM      Result Value Ref Range   MRSA by PCR NEGATIVE  NEGATIVE  GLUCOSE, CAPILLARY     Status: Abnormal   Collection Time    11/03/13  2:53 PM      Result Value Ref Range   Glucose-Capillary 102 (*) 70 - 99 mg/dL  GLUCOSE, CAPILLARY     Status: Abnormal   Collection Time    11/03/13  5:24 PM      Result Value Ref Range   Glucose-Capillary 102 (*) 70 - 99 mg/dL  GLUCOSE, CAPILLARY     Status: None   Collection Time    11/03/13  8:01 PM      Result Value Ref Range   Glucose-Capillary 89  70 - 99 mg/dL   Comment 1 Notify RN    GLUCOSE, CAPILLARY     Status: Abnormal   Collection Time    11/04/13 12:18 AM      Result Value Ref Range   Glucose-Capillary 101 (*) 70 - 99 mg/dL   Comment 1 Notify RN    GLUCOSE, CAPILLARY     Status: Abnormal   Collection Time    11/04/13  5:18 AM      Result Value Ref Range   Glucose-Capillary 100 (*) 70 - 99 mg/dL  COMPREHENSIVE METABOLIC PANEL     Status: Abnormal   Collection Time    11/04/13  5:25 AM    Result Value Ref Range   Sodium 138  137 - 147 mEq/L   Potassium 4.6  3.7 - 5.3 mEq/L   Chloride 101  96 - 112 mEq/L   CO2 28  19 - 32 mEq/L   Glucose, Bld 103 (*) 70 - 99 mg/dL   BUN 16  6 - 23 mg/dL   Creatinine, Ser 0.89  0.50 - 1.35 mg/dL   Calcium 9.0  8.4 - 10.5 mg/dL   Total Protein 7.4  6.0 - 8.3 g/dL   Albumin 3.6  3.5 - 5.2 g/dL   AST 28  0 - 37 U/L   ALT 31  0 - 53 U/L   Alkaline Phosphatase 73  39 - 117 U/L   Total Bilirubin 0.4  0.3 - 1.2 mg/dL   GFR calc non Af Amer 90 (*) >90 mL/min   GFR calc Af Amer >90  >90 mL/min  CBC     Status: Abnormal   Collection Time    11/04/13  5:25 AM  Result Value Ref Range   WBC 13.9 (*) 4.0 - 10.5 K/uL   RBC 4.43  4.22 - 5.81 MIL/uL   Hemoglobin 13.9  13.0 - 17.0 g/dL   HCT 42.6  39.0 - 52.0 %   MCV 96.2  78.0 - 100.0 fL   MCH 31.4  26.0 - 34.0 pg   MCHC 32.6  30.0 - 36.0 g/dL   RDW 13.9  11.5 - 15.5 %   Platelets 320  150 - 400 K/uL  GLUCOSE, CAPILLARY     Status: None   Collection Time    11/04/13  7:50 AM      Result Value Ref Range   Glucose-Capillary 99  70 - 99 mg/dL     Studies/Results Radiology     MEDS, Scheduled . arformoterol  15 mcg Nebulization BID  . budesonide (PULMICORT) nebulizer solution  0.5 mg Nebulization BID  . piperacillin-tazobactam (ZOSYN)  IV  3.375 g Intravenous 3 times per day  . senna-docusate  1 tablet Oral BID  . tiotropium  18 mcg Inhalation Daily     Assessment: Acute cholecystitis End stage, O2 dependent COPD  Plan: Perc cholecystostomy drain  Cont IV abx until WBC trending back down then can transition to PO OK to advance diet slowly   Pt will keep drain for ~6 weeks, can f/u with Dr Dalbert Batman as an outpatient  LOS: 2 days    Rosario Adie, MD [facility], [place] [phone]   11/04/2013 8:21 AM

## 2013-11-04 NOTE — Progress Notes (Signed)
PULMONARY / CRITICAL CARE MEDICINE   Name: Allen Green MRN: [id number] DOB: [birth year]    ADMISSION DATE:  11/02/2013 CONSULTATION DATE: 11/02/2013  REFERRING MD :  CCS  CHIEF COMPLAINT:  Abd pain  BRIEF PATIENT DESCRIPTION:  63 yo male smoker admitted with abdominal pain from cholecystitis.  Followed by Dr. Lamonte Sakai for COPD on home oxygen (baseline FEV1 1.61 (48%) from June 2012).  PCCM consulted for pre-op pulmonary assessment.  SIGNIFICANT EVENTS: 4/2 Admit, surgery, PCCM consult 4/3 GB drain placed by IR >> transfer to SDU due to respiratory distress from abd pain with splinting  STUDIES:  4/2 CT abd/pelvis >> Gallbladder wall thickening and inflammatory changes consistent with acute cholecystitis. 4/2 Abd u/s >> cholelithiasis, cholecystitis, fatty liver  LINES / TUBES: PIV  CULTURES: Gallbladder fluid 4/3 >>  ANTIBIOTICS: Zosyn 4/1 >>   SUBJECTIVE:   sitting in chair, much better  VITAL SIGNS: Temp:  [98.1 F (36.7 C)-98.2 F (36.8 C)] 98.1 F (36.7 C) (04/04 0553) Pulse Rate:  [76-89] 76 (04/04 0553) Resp:  [10-26] 20 (04/04 0553) BP: (90-168)/(65-92) 122/75 mmHg (04/04 0553) SpO2:  [73 %-99 %] 94 % (04/04 0553) FiO2 (%):  [35 %-100 %] 35 % (04/03 1119) Weight:  [270 lb 4.5 oz (122.6 kg)] 270 lb 4.5 oz (122.6 kg) (04/03 1100)  INTAKE / OUTPUT: Intake/Output     04/03 0701 - 04/04 0700 04/04 0701 - 04/05 0700   P.O. 240    I.V. (mL/kg) 24.2 (0.2)    IV Piggyback     Total Intake(mL/kg) 264.2 (2.2)    Urine (mL/kg/hr) 625 (0.2)    Drains 200 (0.1)    Total Output 825     Net -560.8            PHYSICAL EXAMINATION: General: Ill appearing, anxious Neuro: Awake, follows commands, normal strength HEENT: no sinus tenderness Cardiovascular: regular Lungs: decreased respiratory excursion, faint b/l wheeze Abdomen: soft, tender RUQ, drain in RUQ, decreased bowel sounds Musculoskeletal: no edema Skin: no rashes  LABS: CBC Recent Labs     11/02/13  0815   11/03/13  0521  11/04/13  0525  WBC  12.5*  11.1*  13.9*  HGB  14.5  14.1  13.9  HCT  43.3  41.5  42.6  PLT  316  309  320    Coag's Recent Labs     11/02/13  0815  INR  1.01    BMET Recent Labs     11/02/13  0815  11/03/13  0521  11/04/13  0525  NA  134*  137  138  K  4.2  4.6  4.6  CL  98  102  101  CO2  26  26  28   BUN  17  15  16   CREATININE  0.85  0.86  0.89  GLUCOSE  114*  109*  103*    Electrolytes Recent Labs     11/02/13  0815  11/03/13  0521  11/04/13  0525  CALCIUM  9.1  8.7  9.0   Liver Enzymes Recent Labs     11/02/13  0815  11/03/13  0521  11/04/13  0525  AST  18  22  28   ALT  20  27  31   ALKPHOS  76  69  73  BILITOT  0.5  0.4  0.4  ALBUMIN  3.6  3.3*  3.6   Glucose Recent Labs     11/03/13  0349  11/03/13  1453  11/03/13  1724  11/03/13  2001  11/04/13  0018  11/04/13  0518  GLUCAP  103*  102*  102*  89  101*  100*    Imaging Ir Perc Cholecystostomy  11/03/2013   CLINICAL DATA:  Acute cholecystitis  EXAM: CHOLECYSTOSTOMY  FLUOROSCOPY TIME:  48 seconds.  MEDICATIONS AND MEDICAL HISTORY: Versed 1 mg, Fentanyl 50 mcg.  Additional Medications: None  ANESTHESIA/SEDATION: Moderate sedation time: 30 minutes  CONTRAST:  10 cc Omnipaque 300  PROCEDURE: The procedure, risks, benefits, and alternatives were explained to the patient. Questions regarding the procedure were encouraged and answered. The patient understands and consents to the procedure.  The right upper quadrant was prepped with Betadine in a sterile fashion, and a sterile drape was applied covering the operative field. A sterile gown and sterile gloves were used for the procedure.  A 21 gauge needle was inserted into the gallbladder lumen under sonographic guidance and via transhepatic approach. It was removed over a 018 wire, which was upsized to a 3-J. A 10-French drain was advanced over the wire and coiled in the gallbladder lumen. It was sewn to the skin after being string fixed.   FINDINGS: The cystic duct is occluded.  COMPLICATIONS: None  IMPRESSION: Successful cholecystostomy. This needs to remain in place at least 6 weeks.   Electronically Signed   By: Maryclare Bean M.D.   On: 11/03/2013 15:18   Dg Chest Port 1 View  11/04/2013   CLINICAL DATA:  Followup COPD  EXAM: PORTABLE CHEST - 1 VIEW  COMPARISON:  11/03/2013  FINDINGS: Cardiac shadow is stable. Mild interstitial changes are again identified bilaterally without focal confluent infiltrate. This likely represents a component of interstitial edema. No other focal abnormality is seen.  IMPRESSION: Stable appearance of the chest when compare with the previous day.   Electronically Signed   By: Inez Catalina M.D.   On: 11/04/2013 07:30   Dg Chest Port 1 View  11/03/2013   CLINICAL DATA:  Respiratory distress.  EXAM: PORTABLE CHEST - 1 VIEW  COMPARISON:  11/02/2013  FINDINGS: The cardiomediastinal silhouette is within normal limits. The patient has taken a shallower inspiration than on the prior study. There is mild pulmonary vascular congestion, similar prior. Diffusely increased interstitial markings are unchanged. There is no evidence of segmental airspace consolidation, pleural effusion, or pneumothorax. No acute osseous abnormality is identified.  IMPRESSION: Shallow lung inflation with persistent pulmonary vascular congestion and increased interstitial markings, possibly mild edema.   Electronically Signed   By: Logan Bores   On: 11/03/2013 11:38    ASSESSMENT / PLAN:  PULMONARY A:  Acute respiratory distress 4/03 after perc drain inserted >> likely from Abdominal pain and splinting> resolved 4/4 Hx of COPD on home oxygen with mild exacerbation. Tobacco abuse. Possible sleep apnea. P:   Defer addition of systemic steroids for now Changed dulera to nebulized brovana/pulmicort for now Continue schedule spiriva though there is some risk of ileus/constipation from anticholinergic effects so low threshold to hold PRN  albuterol PRN BiPAP Oxygen to keep SpO2 > 90% Bronchial hygiene/ IS F/u CXR  A: Acute cholecystitis s/p perc drain 4/03. P: Per primary team, CCS, IR   Zosyn per primary team   A: DVT prevention. P: SCD's Add heparin when okay with IR   Will see again 4/6, call in meantime if needed    Christinia Gully, MD Pulmonary and [place] [phone] After 5:30 PM or weekends, call [phone]

## 2013-11-04 NOTE — Progress Notes (Signed)
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 male with history of severe COPD, polycythemia secondary to COPD presented to the ER because of abdominal pain found to have cholecystitis   1. Acute cholecystitis; s/p percutaneous drain 4/3 -appreciate surgery care; cont IVF, IV atx, diet as tolerated   2. COPD+active smoker, exam no wheezing; CXR: mild congestion; echo (2012)LVEF 55%, reduced RV function;  -cont inhalers prn, oxygen; appreciate pulmonology assistance   3. Chronic respiratory failure on home oxygen; cont inhalers, oxygen   D/c plans per surgery, pulmonology   Code Status: full Family Communication: d/w patient, his wife (indicate person spoken with, relationship, and if by phone, the number) Disposition Plan: home pend clinical improvemkent   Consultants:  Surgery, pulmonology   Procedures:  None   Antibiotics:  Zosyn 4/2<<< (indicate start date, and stop date if known)  HPI/Subjective: alert  Objective: Filed Vitals:   11/04/13 0553  BP: 122/75  Pulse: 76  Temp: 98.1 F (36.7 C)  Resp: 20    Intake/Output Summary (Last 24 hours) at 11/04/13 0846 Last data filed at 11/04/13 0454  Gross per 24 hour  Intake 264.17 ml  Output    825 ml  Net -560.83 ml   Filed Weights   11/02/13 0617 11/03/13 1100  Weight: 121.473 kg (267 lb 12.8 oz) 122.6 kg (270 lb 4.5 oz)    Exam:   General:  alert  Cardiovascular: s1,s2 rrr  Respiratory: decreased AE in LL  Abdomen: soft, obese, mild tender  Musculoskeletal: no LE edema   Data Reviewed: Basic Metabolic Panel:  Recent Labs Lab 11/02/13 0200 11/02/13 0815 11/03/13 0521 11/04/13 0525  NA 136* 134* 137 138  K 4.2 4.2 4.6  4.6  CL 99 98 102 101  CO2 25 26 26 28   GLUCOSE 118* 114* 109* 103*  BUN 22 17 15 16   CREATININE 0.91 0.85 0.86 0.89  CALCIUM 9.4 9.1 8.7 9.0   Liver Function Tests:  Recent Labs Lab 11/02/13 0200 11/02/13 0815 11/03/13 0521 11/04/13 0525  AST 18 18 22 28   ALT 21 20 27 31   ALKPHOS 90 76 69 73  BILITOT 0.4 0.5 0.4 0.4  PROT 7.9 7.0 6.7 7.4  ALBUMIN 3.8 3.6 3.3* 3.6    Recent Labs Lab 11/02/13 0200 11/03/13 0521  LIPASE 17 15   No results found for this basename: AMMONIA,  in the last 168 hours CBC:  Recent Labs Lab 11/02/13 0200 11/02/13 0815 11/03/13 0521 11/04/13 0525  WBC 15.9* 12.5* 11.1* 13.9*  NEUTROABS  --  8.3*  --   --   HGB 16.5 14.5 14.1 13.9  HCT 47.0 43.3 41.5 42.6  MCV 93.4 92.7 93.9 96.2  PLT 322 316 309 320   Cardiac Enzymes: No results found for this basename: CKTOTAL, CKMB, CKMBINDEX, TROPONINI,  in the last 168 hours BNP (last 3 results)  Recent Labs  08/09/13 1550  PROBNP 222.0*   CBG:  Recent Labs Lab 11/03/13 1724 11/03/13 2001 11/04/13 0018 11/04/13 0518 11/04/13 0750  GLUCAP 102* 89 101* 100* 99    Recent Results (from the past 240 hour(s))  MRSA PCR SCREENING     Status: None   Collection Time    11/03/13 11:04 AM      Result Value  Ref Range Status   MRSA by PCR NEGATIVE  NEGATIVE Final   Comment:            The GeneXpert MRSA Assay (FDA     approved for NASAL specimens     only), is one component of a     comprehensive MRSA colonization     surveillance program. It is not     intended to diagnose MRSA     infection nor to guide or     monitor treatment for     MRSA infections.     Studies: Ir Perc Cholecystostomy  11/03/2013   CLINICAL DATA:  Acute cholecystitis  EXAM: CHOLECYSTOSTOMY  FLUOROSCOPY TIME:  48 seconds.  MEDICATIONS AND MEDICAL HISTORY: Versed 1 mg, Fentanyl 50 mcg.  Additional Medications: None  ANESTHESIA/SEDATION: Moderate sedation time: 30 minutes  CONTRAST:  10 cc Omnipaque 300  PROCEDURE:  The procedure, risks, benefits, and alternatives were explained to the patient. Questions regarding the procedure were encouraged and answered. The patient understands and consents to the procedure.  The right upper quadrant was prepped with Betadine in a sterile fashion, and a sterile drape was applied covering the operative field. A sterile gown and sterile gloves were used for the procedure.  A 21 gauge needle was inserted into the gallbladder lumen under sonographic guidance and via transhepatic approach. It was removed over a 018 wire, which was upsized to a 3-J. A 10-French drain was advanced over the wire and coiled in the gallbladder lumen. It was sewn to the skin after being string fixed.  FINDINGS: The cystic duct is occluded.  COMPLICATIONS: None  IMPRESSION: Successful cholecystostomy. This needs to remain in place at least 6 weeks.   Electronically Signed   By: Maryclare Bean M.D.   On: 11/03/2013 15:18   Dg Chest Port 1 View  11/04/2013   CLINICAL DATA:  Followup COPD  EXAM: PORTABLE CHEST - 1 VIEW  COMPARISON:  11/03/2013  FINDINGS: Cardiac shadow is stable. Mild interstitial changes are again identified bilaterally without focal confluent infiltrate. This likely represents a component of interstitial edema. No other focal abnormality is seen.  IMPRESSION: Stable appearance of the chest when compare with the previous day.   Electronically Signed   By: Inez Catalina M.D.   On: 11/04/2013 07:30   Dg Chest Port 1 View  11/03/2013   CLINICAL DATA:  Respiratory distress.  EXAM: PORTABLE CHEST - 1 VIEW  COMPARISON:  11/02/2013  FINDINGS: The cardiomediastinal silhouette is within normal limits. The patient has taken a shallower inspiration than on the prior study. There is mild pulmonary vascular congestion, similar prior. Diffusely increased interstitial markings are unchanged. There is no evidence of segmental airspace consolidation, pleural effusion, or pneumothorax. No acute osseous abnormality is  identified.  IMPRESSION: Shallow lung inflation with persistent pulmonary vascular congestion and increased interstitial markings, possibly mild edema.   Electronically Signed   By: Logan Bores   On: 11/03/2013 11:38    Scheduled Meds: . arformoterol  15 mcg Nebulization BID  . budesonide (PULMICORT) nebulizer solution  0.5 mg Nebulization BID  . piperacillin-tazobactam (ZOSYN)  IV  3.375 g Intravenous 3 times per day  . senna-docusate  1 tablet Oral BID  . tiotropium  18 mcg Inhalation Daily   Continuous Infusions: . sodium chloride 50 mL/hr at 11/03/13 1131    Principal Problem:   Acute cholecystitis Active Problems:   COPD (chronic obstructive pulmonary disease)   Tobacco abuse   Polycythemia secondary  to hypoxia   Leucocytosis    Time spent: >35 minutes     Kinnie Feil  [facility] Pager [phone]. If 7PM-7AM, please contact night-coverage at [email or web address], password [facility] 11/04/2013, 8:46 AM  LOS: 2 days

## 2013-11-05 LAB — CBC
HCT: 40.6 % (ref 39.0–52.0)
Hemoglobin: 13.5 g/dL (ref 13.0–17.0)
MCH: 31 pg (ref 26.0–34.0)
MCHC: 33.3 g/dL (ref 30.0–36.0)
MCV: 93.1 fL (ref 78.0–100.0)
PLATELETS: 298 10*3/uL (ref 150–400)
RBC: 4.36 MIL/uL (ref 4.22–5.81)
RDW: 13.4 % (ref 11.5–15.5)
WBC: 11.1 10*3/uL — AB (ref 4.0–10.5)

## 2013-11-05 LAB — GLUCOSE, CAPILLARY
GLUCOSE-CAPILLARY: 89 mg/dL (ref 70–99)
Glucose-Capillary: 130 mg/dL — ABNORMAL HIGH (ref 70–99)

## 2013-11-05 MED ORDER — LEVOFLOXACIN 750 MG PO TABS: 750.0000 mg | ORAL_TABLET | Freq: Every day | ORAL | Status: DC

## 2013-11-05 MED ORDER — METRONIDAZOLE 500 MG PO TABS: 500.0000 mg | ORAL_TABLET | Freq: Three times a day (TID) | ORAL | Status: DC

## 2013-11-05 NOTE — Progress Notes (Signed)
Acute cholecystitis  Subjective: Pt still feeling better after drain placement.  Tolerating a diet.  Objective: Vital signs in last 24 hours: Temp:  [97.4 F (36.3 C)-98.2 F (36.8 C)] 97.4 F (36.3 C) (04/05 0530) Pulse Rate:  [75-98] 75 (04/05 0530) Resp:  [18] 18 (04/05 0530) BP: (103-195)/(66-97) 118/66 mmHg (04/05 0530) SpO2:  [94 %] 94 % (04/05 0530) Last BM Date: 11/01/13  Intake/Output from previous day: 04/04 0701 - 04/05 0700 In: 600 [P.O.:600] Out: 1525 [Urine:1150; Drains:375] Intake/Output this shift:    General appearance: alert and cooperative GI: much less RUQ tenderness  Drain output bilious and non-cloudy  Lab Results:  Results for orders placed during the hospital encounter of 11/02/13 (from the past 24 hour(s))  GLUCOSE, CAPILLARY     Status: None   Collection Time    11/04/13 12:10 PM      Result Value Ref Range   Glucose-Capillary 91  70 - 99 mg/dL  GLUCOSE, CAPILLARY     Status: Abnormal   Collection Time    11/04/13  5:03 PM      Result Value Ref Range   Glucose-Capillary 113 (*) 70 - 99 mg/dL  GLUCOSE, CAPILLARY     Status: Abnormal   Collection Time    11/04/13  8:03 PM      Result Value Ref Range   Glucose-Capillary 121 (*) 70 - 99 mg/dL  GLUCOSE, CAPILLARY     Status: Abnormal   Collection Time    11/05/13  1:21 AM      Result Value Ref Range   Glucose-Capillary 130 (*) 70 - 99 mg/dL  GLUCOSE, CAPILLARY     Status: None   Collection Time    11/05/13  4:14 AM      Result Value Ref Range   Glucose-Capillary 89  70 - 99 mg/dL  CBC     Status: Abnormal   Collection Time    11/05/13  7:18 AM      Result Value Ref Range   WBC 11.1 (*) 4.0 - 10.5 K/uL   RBC 4.36  4.22 - 5.81 MIL/uL   Hemoglobin 13.5  13.0 - 17.0 g/dL   HCT 40.6  39.0 - 52.0 %   MCV 93.1  78.0 - 100.0 fL   MCH 31.0  26.0 - 34.0 pg   MCHC 33.3  30.0 - 36.0 g/dL   RDW 13.4  11.5 - 15.5 %   Platelets 298  150 - 400 K/uL      Studies/Results Radiology     MEDS, Scheduled . arformoterol  15 mcg Nebulization BID  . budesonide (PULMICORT) nebulizer solution  0.5 mg Nebulization BID  . piperacillin-tazobactam (ZOSYN)  IV  3.375 g Intravenous 3 times per day  . senna-docusate  1 tablet Oral BID  . tiotropium  18 mcg Inhalation Daily     Assessment: Acute cholecystitis End stage, O2 dependent COPD  Plan: Perc cholecystostomy drain  transition to PO abx for a 7-10 days total Pt will keep drain for ~6 weeks, can f/u with Dr Dalbert Batman as an outpatient  [place] to d/c from my standpoint   LOS: 3 days    Rosario Adie, MD [facility], [place]   11/05/2013 8:38 AM

## 2013-11-05 NOTE — Progress Notes (Signed)
Discharge instructions given understanding verbalized education on dressing change and cleaning.

## 2013-11-05 NOTE — Progress Notes (Signed)
PULMONARY / CRITICAL CARE MEDICINE   Name: Allen Green MRN: [id number] DOB: April 12, 1951    ADMISSION DATE:  11/02/2013 CONSULTATION DATE: 11/02/2013  REFERRING MD :  CCS  CHIEF COMPLAINT:  Abd pain  BRIEF PATIENT DESCRIPTION:  63 yo male smoker admitted with abdominal pain from cholecystitis.  Followed by Dr. Lamonte Sakai for COPD on home oxygen (baseline FEV1 1.61 (48%) from June 2012).  PCCM consulted for pre-op pulmonary assessment.  SIGNIFICANT EVENTS: 4/2 Admit, surgery, PCCM consult 4/3 GB drain placed by IR >> transfer to SDU due to respiratory distress from abd pain with splinting  STUDIES:  4/2 CT abd/pelvis >> Gallbladder wall thickening and inflammatory changes consistent with acute cholecystitis. 4/2 Abd u/s >> cholelithiasis, cholecystitis, fatty liver  LINES / TUBES: PIV  CULTURES: Gallbladder fluid 4/3 >>  ANTIBIOTICS: Zosyn 4/1 >>   SUBJECTIVE:  Supine/ 30 degrees hob on 3lpm nad  VITAL SIGNS: Temp:  [97.4 F (36.3 C)-98.2 F (36.8 C)] 97.4 F (36.3 C) (04/05 0530) Pulse Rate:  [75-98] 75 (04/05 0530) Resp:  [18] 18 (04/05 0530) BP: (103-195)/(66-97) 118/66 mmHg (04/05 0530) SpO2:  [94 %] 94 % (04/05 0530)  INTAKE / OUTPUT: Intake/Output     04/04 0701 - 04/05 0700 04/05 0701 - 04/06 0700   P.O. 600    I.V. (mL/kg)     Total Intake(mL/kg) 600 (4.9)    Urine (mL/kg/hr) 1150 (0.4)    Drains 375 (0.1)    Total Output 1525     Net -925          Urine Occurrence 2 x    Stool Occurrence 2 x      PHYSICAL EXAMINATION: General: chronically ill appearing nad  Neuro: Awake, follows commands, normal strength HEENT: no sinus tenderness Cardiovascular: regular Lungs: decreased respiratory excursion, no exp wheeze Abdomen: soft, tender RUQ, drain in RUQ, decreased bowel sounds Musculoskeletal: no edema Skin: no rashes  LABS: CBC Recent Labs     11/03/13  0521  11/04/13  0525  11/05/13  0718  WBC  11.1*  13.9*  11.1*  HGB  14.1  13.9  13.5  HCT  41.5   42.6  40.6  PLT  309  320  298    Coag's No results found for this basename: APTT, INR,  in the last 72 hours  BMET Recent Labs     11/03/13  0521  11/04/13  0525  NA  137  138  K  4.6  4.6  CL  102  101  CO2  26  28  BUN  15  16  CREATININE  0.86  0.89  GLUCOSE  109*  103*    Electrolytes Recent Labs     11/03/13  0521  11/04/13  0525  CALCIUM  8.7  9.0   Liver Enzymes Recent Labs     11/03/13  0521  11/04/13  0525  AST  22  28  ALT  27  31  ALKPHOS  69  73  BILITOT  0.4  0.4  ALBUMIN  3.3*  3.6   Glucose Recent Labs     11/04/13  0750  11/04/13  1210  11/04/13  1703  11/04/13  2003  11/05/13  0121  11/05/13  0414  GLUCAP  99  91  113*  121*  130*  89    Imaging Ir Perc Cholecystostomy  11/03/2013   CLINICAL DATA:  Acute cholecystitis  EXAM: CHOLECYSTOSTOMY  FLUOROSCOPY TIME:  48 seconds.  MEDICATIONS  AND MEDICAL HISTORY: Versed 1 mg, Fentanyl 50 mcg.  Additional Medications: None  ANESTHESIA/SEDATION: Moderate sedation time: 30 minutes  CONTRAST:  10 cc Omnipaque 300  PROCEDURE: The procedure, risks, benefits, and alternatives were explained to the patient. Questions regarding the procedure were encouraged and answered. The patient understands and consents to the procedure.  The right upper quadrant was prepped with Betadine in a sterile fashion, and a sterile drape was applied covering the operative field. A sterile gown and sterile gloves were used for the procedure.  A 21 gauge needle was inserted into the gallbladder lumen under sonographic guidance and via transhepatic approach. It was removed over a 018 wire, which was upsized to a 3-J. A 10-French drain was advanced over the wire and coiled in the gallbladder lumen. It was sewn to the skin after being string fixed.  FINDINGS: The cystic duct is occluded.  COMPLICATIONS: None  IMPRESSION: Successful cholecystostomy. This needs to remain in place at least 6 weeks.   Electronically Signed   By: Maryclare Bean M.D.    On: 11/03/2013 15:18   Dg Chest Port 1 View  11/04/2013   CLINICAL DATA:  Followup COPD  EXAM: PORTABLE CHEST - 1 VIEW  COMPARISON:  11/03/2013  FINDINGS: Cardiac shadow is stable. Mild interstitial changes are again identified bilaterally without focal confluent infiltrate. This likely represents a component of interstitial edema. No other focal abnormality is seen.  IMPRESSION: Stable appearance of the chest when compare with the previous day.   Electronically Signed   By: Inez Catalina M.D.   On: 11/04/2013 07:30   Dg Chest Port 1 View  11/03/2013   CLINICAL DATA:  Respiratory distress.  EXAM: PORTABLE CHEST - 1 VIEW  COMPARISON:  11/02/2013  FINDINGS: The cardiomediastinal silhouette is within normal limits. The patient has taken a shallower inspiration than on the prior study. There is mild pulmonary vascular congestion, similar prior. Diffusely increased interstitial markings are unchanged. There is no evidence of segmental airspace consolidation, pleural effusion, or pneumothorax. No acute osseous abnormality is identified.  IMPRESSION: Shallow lung inflation with persistent pulmonary vascular congestion and increased interstitial markings, possibly mild edema.   Electronically Signed   By: Logan Bores   On: 11/03/2013 11:38    ASSESSMENT / PLAN:  PULMONARY A:  Acute respiratory distress 4/03 after perc drain inserted >> likely from Abdominal pain and splinting> resolved 4/4 Hx of COPD on home oxygen with mild exacerbation, resolved  Tobacco abuse. Possible sleep apnea. P:   No systemic steroids for now Changed dulera to nebulized brovana/pulmicort > ok to resume home rx on discharge Continue schedule spiriva though there is some risk of ileus/constipation from anticholinergic effects so low threshold to hold PRN albuterol PRN BiPAP Oxygen to keep SpO2 > 90% Bronchial hygiene/ IS    A: Acute cholecystitis s/p perc drain 4/03. P: Per primary team, [facility], IR   Zosyn per primary team    A: DVT prevention. P: SCD's Add heparin when okay with IR   Ok from pulmonary perspective to d/c home and continue home pulmonary rx - he already has f/u with Dr Lamonte Sakai   Christinia Gully, MD Pulmonary and [place] [phone] After 5:30 PM or weekends, call [phone]

## 2013-11-05 NOTE — Discharge Summary (Signed)
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 male with history of severe COPD, polycythemia secondary to COPD presented to the ER because of abdominal pain found to have cholecystitis   Hospital Course:  1. Acute cholecystitis; due to severe COPD/respiratory risk and pulmonology, surgery evaluations patient decided to get perc chole draining than cholecystectomy  -s/p percutaneous drain 4/3; improved, afebrile; leukocytosis resolving; tolerating diet; d/c home on PO atx, f/u with surgery   2. COPD+active smoker, exam no wheezing; CXR: mild congestion; echo (2012)LVEF 55%, reduced RV function;  -stable on current regimen  3. Chronic respiratory failure on home oxygen; cont inhalers, oxygen    Procedures:  S/p pect cytostome  (i.e. Studies not automatically included, echos, thoracentesis, etc; not x-rays)  Consultations:  Surgery, pulmonology   Discharge Exam: Filed Vitals:   11/05/13 0530  BP: 118/66  Pulse: 75  Temp: 97.4 F (36.3 C)  Resp: 18    General: alert Cardiovascular: s1,s2 rrr Respiratory: CTA BL  Discharge Instructions  Discharge Orders   Future  Appointments Provider Department Dept Phone   12/18/2013 4:00 PM Satira Sark, MD [place] [phone]   Future Orders Complete By Expires   Diet - low sodium heart healthy  As directed    Discharge instructions  As directed    Comments:     Please follow up with surgery in 2 weeks   Increase activity slowly  As directed        Medication List         albuterol 108 (90 BASE) MCG/ACT inhaler  Commonly known as:  PROVENTIL HFA;VENTOLIN HFA  Inhale 2 puffs into the lungs every 4 (four) hours as needed for wheezing or shortness of breath.     benzonatate 200 MG capsule  Commonly known as:  TESSALON  Take 1 capsule (200 mg total) by mouth 3 (three) times daily as needed for cough.     clobetasol ointment 0.05 %  Commonly known as:  TEMOVATE  Apply 1 application topically 2 (two) times daily as needed (rash).     fluticasone 50 MCG/ACT nasal spray  Commonly known as:  FLONASE  Place 2 sprays into both nostrils daily as needed for allergies or rhinitis.     levofloxacin 750 MG tablet  Commonly known as:  LEVAQUIN  Take 1 tablet (750 mg total) by mouth daily.     metroNIDAZOLE 500 MG tablet  Commonly known as:  FLAGYL  Take 1 tablet (500 mg total) by mouth 3 (three) times daily.     mometasone-formoterol 200-5 MCG/ACT Aero  Commonly known as:  DULERA  Inhale 2 puffs into the lungs 2 (two) times daily.  tiotropium 18 MCG inhalation capsule  Commonly known as:  SPIRIVA HANDIHALER  Place 1 capsule (18 mcg total) into inhaler and inhale daily.       No Known Allergies     Follow-up Information   Follow up with Adin Hector, MD In 2 weeks.   Specialty:  General Surgery   Contact information:   [place] [id number] [phone]       Follow up with Thornton Dales I, MD In 1 week.   Specialty:  Family Medicine       The results of significant diagnostics from this hospitalization (including  imaging, microbiology, ancillary and laboratory) are listed below for reference.    Significant Diagnostic Studies: Ct Abdomen Pelvis W Contrast  11/02/2013   CLINICAL DATA:  Abdominal pain, distention, nausea.  EXAM: CT ABDOMEN AND PELVIS WITH CONTRAST  TECHNIQUE: Multidetector CT imaging of the abdomen and pelvis was performed using the standard protocol following bolus administration of intravenous contrast.  CONTRAST:  138mL OMNIPAQUE IOHEXOL 300 MG/ML  SOLN  COMPARISON:  CT CHEST W/CM dated 04/27/2011  FINDINGS: Included view of the lung bases 4 mm pulmonary nodule in right middle lobe, axial 1/22, unchanged. Visualized heart and pericardium are unremarkable.  Moderate gallbladder wall thickening and inflammatory changes. No CT findings of cholelithiasis or choledocholithiasis. The liver, spleen, pancreas and adrenal glands are unremarkable.  The stomach, small and large bowel are normal in course and caliber without inflammatory changes. A few scattered colonic diverticula seen. Normal appendix. No intraperitoneal free fluid nor free air.  Kidneys are orthotopic, demonstrating symmetric enhancement punctate nonobstructing right interpolar renal calculus. No hydronephrosis or renal masses. The unopacified ureters are normal in course and caliber. Delayed imaging through the kidneys demonstrates symmetric prompt excretion to the proximal urinary collecting system. Urinary bladder is partially distended and unremarkable.  The aortoiliac vessels are normal in course and caliber with moderate to severe intimal thickening and calcific atherosclerosis, no aneurysm. No lymphadenopathy by CT size criteria. Internal reproductive organs are unremarkable. Chronic bilateral L5 pars interarticularis defects without spondylolisthesis. Mild degenerative change of lumbar spine.  IMPRESSION: Gallbladder wall thickening and inflammatory changes consistent with acute cholecystitis.   Electronically Signed   By: Elon Alas   On: 11/02/2013 03:41   Ir Perc Cholecystostomy  11/03/2013   CLINICAL DATA:  Acute cholecystitis  EXAM: CHOLECYSTOSTOMY  FLUOROSCOPY TIME:  48 seconds.  MEDICATIONS AND MEDICAL HISTORY: Versed 1 mg, Fentanyl 50 mcg.  Additional Medications: None  ANESTHESIA/SEDATION: Moderate sedation time: 30 minutes  CONTRAST:  10 cc Omnipaque 300  PROCEDURE: The procedure, risks, benefits, and alternatives were explained to the patient. Questions regarding the procedure were encouraged and answered. The patient understands and consents to the procedure.  The right upper quadrant was prepped with Betadine in a sterile fashion, and a sterile drape was applied covering the operative field. A sterile gown and sterile gloves were used for the procedure.  A 21 gauge needle was inserted into the gallbladder lumen under sonographic guidance and via transhepatic approach. It was removed over a 018 wire, which was upsized to a 3-J. A 10-French drain was advanced over the wire and coiled in the gallbladder lumen. It was sewn to the skin after being string fixed.  FINDINGS: The cystic duct is occluded.  COMPLICATIONS: None  IMPRESSION: Successful cholecystostomy. This needs to remain in place at least 6 weeks.   Electronically Signed   By: Maryclare Bean M.D.   On:  11/03/2013 15:18   Dg Chest Port 1 View  11/04/2013   CLINICAL DATA:  Followup COPD  EXAM: PORTABLE CHEST - 1 VIEW  COMPARISON:  11/03/2013  FINDINGS: Cardiac shadow is stable. Mild interstitial changes are again identified bilaterally without focal confluent infiltrate. This likely represents a component of interstitial edema. No other focal abnormality is seen.  IMPRESSION: Stable appearance of the chest when compare with the previous day.   Electronically Signed   By: Inez Catalina M.D.   On: 11/04/2013 07:30   Dg Chest Port 1 View  11/03/2013   CLINICAL DATA:  Respiratory distress.  EXAM: PORTABLE CHEST - 1 VIEW  COMPARISON:  11/02/2013  FINDINGS: The  cardiomediastinal silhouette is within normal limits. The patient has taken a shallower inspiration than on the prior study. There is mild pulmonary vascular congestion, similar prior. Diffusely increased interstitial markings are unchanged. There is no evidence of segmental airspace consolidation, pleural effusion, or pneumothorax. No acute osseous abnormality is identified.  IMPRESSION: Shallow lung inflation with persistent pulmonary vascular congestion and increased interstitial markings, possibly mild edema.   Electronically Signed   By: Logan Bores   On: 11/03/2013 11:38   Dg Chest Port 1 View  11/02/2013   CLINICAL DATA:  Cough, congestion and shortness of breath.  EXAM: PORTABLE CHEST - 1 VIEW  COMPARISON:  Chest radiograph performed 08/28/2013  FINDINGS: The lungs are well-aerated. Vascular congestion is noted. Increased interstitial markings may reflect mild interstitial edema. No definite pleural effusion or pneumothorax is seen.  The cardiomediastinal silhouette is within normal limits. No acute osseous abnormalities are seen.  IMPRESSION: Vascular congestion noted. Increased interstitial markings may reflect mild interstitial edema.   Electronically Signed   By: Garald Balding M.D.   On: 11/02/2013 06:00   US Abdomen Limited Ruq  11/02/2013   CLINICAL DATA:  Abdominal pain.  EXAM: US ABDOMEN LIMITED - RIGHT UPPER QUADRANT  COMPARISON:  None.  FINDINGS: Gallbladder:  Multiple stones are seen dependently within the gallbladder, measuring up to 1.8 cm in size. There is suggestion of mild gallbladder wall thickening, but no pericholecystic fluid is seen, and no ultrasonographic Murphy's sign is elicited.  Common bile duct:  Diameter: 0.5 cm, within normal limits in caliber.  Liver:  No focal lesion identified. Diffusely increased parenchymal echogenicity and coarsened echotexture, compatible with fatty infiltration.  IMPRESSION: 1. Cholelithiasis noted; suggestion of mild gallbladder wall thickening,  without additional evidence to suggest cholecystitis or obstruction. No ultrasonographic Murphy's sign seen. 2. Diffuse fatty infiltration within the liver.   Electronically Signed   By: Garald Balding M.D.   On: 11/02/2013 05:35    Microbiology: Recent Results (from the past 240 hour(s))  MRSA PCR SCREENING     Status: None   Collection Time    11/03/13 11:04 AM      Result Value Ref Range Status   MRSA by PCR NEGATIVE  NEGATIVE Final   Comment:            The GeneXpert MRSA Assay (FDA     approved for NASAL specimens     only), is one component of a     comprehensive MRSA colonization     surveillance program. It is not     intended to diagnose MRSA     infection nor to guide or     monitor treatment for     MRSA infections.     Labs: Basic Metabolic Panel:  Recent Labs Lab 11/02/13 0200 11/02/13  0815 11/03/13 0521 11/04/13 0525  NA 136* 134* 137 138  K 4.2 4.2 4.6 4.6  CL 99 98 102 101  CO2 25 26 26 28   GLUCOSE 118* 114* 109* 103*  BUN 22 17 15 16   CREATININE 0.91 0.85 0.86 0.89  CALCIUM 9.4 9.1 8.7 9.0   Liver Function Tests:  Recent Labs Lab 11/02/13 0200 11/02/13 0815 11/03/13 0521 11/04/13 0525  AST 18 18 22 28   ALT 21 20 27 31   ALKPHOS 90 76 69 73  BILITOT 0.4 0.5 0.4 0.4  PROT 7.9 7.0 6.7 7.4  ALBUMIN 3.8 3.6 3.3* 3.6    Recent Labs Lab 11/02/13 0200 11/03/13 0521  LIPASE 17 15   No results found for this basename: AMMONIA,  in the last 168 hours CBC:  Recent Labs Lab 11/02/13 0200 11/02/13 0815 11/03/13 0521 11/04/13 0525 11/05/13 0718  WBC 15.9* 12.5* 11.1* 13.9* 11.1*  NEUTROABS  --  8.3*  --   --   --   HGB 16.5 14.5 14.1 13.9 13.5  HCT 47.0 43.3 41.5 42.6 40.6  MCV 93.4 92.7 93.9 96.2 93.1  PLT 322 316 309 320 298   Cardiac Enzymes: No results found for this basename: CKTOTAL, CKMB, CKMBINDEX, TROPONINI,  in the last 168 hours BNP: BNP (last 3 results)  Recent Labs  08/09/13 1550  PROBNP 222.0*   CBG:  Recent  Labs Lab 11/04/13 1210 11/04/13 1703 11/04/13 2003 11/05/13 0121 11/05/13 0414  GLUCAP 91 113* 121* 130* 89       Signed:  Sharra Cayabyab N  [facility] 11/05/2013, 9:51 AM

## 2013-11-07 ENCOUNTER — Telehealth (INDEPENDENT_AMBULATORY_CARE_PROVIDER_SITE_OTHER): Payer: Self-pay | Admitting: *Deleted

## 2013-11-07 NOTE — Telephone Encounter (Signed)
Called and let pt and wife know his f/u appt is 4.23.15 with Dr. Excell Seltzer and to arrive 30 mins before appt time. They both understand and are in agreeance.  Anderson Malta

## 2013-11-23 ENCOUNTER — Ambulatory Visit (INDEPENDENT_AMBULATORY_CARE_PROVIDER_SITE_OTHER): Payer: Medicare Other | Admitting: General Surgery

## 2013-11-23 ENCOUNTER — Encounter (INDEPENDENT_AMBULATORY_CARE_PROVIDER_SITE_OTHER): Payer: Self-pay | Admitting: General Surgery

## 2013-11-23 VITALS — BP 128/82 | HR 80 | Temp 97.6°F | Ht 71.0 in | Wt 267.2 lb

## 2013-11-23 DIAGNOSIS — K810 Acute cholecystitis: Secondary | ICD-10-CM

## 2013-11-23 NOTE — Progress Notes (Signed)
Chief complaint: Followup acute cholecystitis  History: Patient returns to the office after being hospitalized about 3 weeks ago with acute cholecystitis. The patient has severe COPD, oxygen dependent. Consultation was obtained at that time with pulmonary who felt he was at increased risk for pulmonary complications although not prohibitive. Dr. Dalbert Batman discussed options with him and he elected at that time percutaneous drainage. He did well and was discharged about 2 weeks ago. He reports he has not had any recurrent abdominal pain or fever. He gets a little bloating after eating. No problems with the catheter. No acute pulmonary issues.  Exam: BP 128/82  Pulse 80  Temp(Src) 97.6 F (36.4 C)  Ht 5\' 11"  (1.803 m)  Wt 267 lb 3.2 oz (121.201 kg)  BMI 37.28 kg/m2 General: Obese Caucasian male in no distress Lungs: No increased work of breathing Abdomen: Soft and nontender. Biliary drainage catheter in place draining clear bile.  Assessment and plan: Status post percutaneous drainage for acute cholecystitis as above. I will see him back in 3-4 weeks and we will get a catheter injection to assess for cystic duct patency just prior to that appointment. We discussed the pros and cons of cholecystectomy.

## 2013-11-30 ENCOUNTER — Other Ambulatory Visit (INDEPENDENT_AMBULATORY_CARE_PROVIDER_SITE_OTHER): Payer: Self-pay

## 2013-12-01 ENCOUNTER — Other Ambulatory Visit (INDEPENDENT_AMBULATORY_CARE_PROVIDER_SITE_OTHER): Payer: Self-pay

## 2013-12-04 ENCOUNTER — Other Ambulatory Visit (INDEPENDENT_AMBULATORY_CARE_PROVIDER_SITE_OTHER): Payer: Self-pay

## 2013-12-04 DIAGNOSIS — K811 Chronic cholecystitis: Secondary | ICD-10-CM

## 2013-12-05 ENCOUNTER — Ambulatory Visit (HOSPITAL_COMMUNITY)
Admission: RE | Admit: 2013-12-05 | Discharge: 2013-12-05 | Disposition: A | Discharge status: Home / Self Care | Payer: Medicare Other | Source: Ambulatory Visit | Attending: General Surgery | Admitting: General Surgery

## 2013-12-05 ENCOUNTER — Other Ambulatory Visit (INDEPENDENT_AMBULATORY_CARE_PROVIDER_SITE_OTHER): Payer: Self-pay | Admitting: General Surgery

## 2013-12-05 DIAGNOSIS — K810 Acute cholecystitis: Secondary | ICD-10-CM | POA: Insufficient documentation

## 2013-12-05 DIAGNOSIS — K811 Chronic cholecystitis: Secondary | ICD-10-CM

## 2013-12-05 MED ORDER — IOHEXOL 300 MG/ML  SOLN
20.0000 mL | Freq: Once | INTRAMUSCULAR | Status: AC | PRN
  Administered 2013-12-05: 20 mL

## 2013-12-05 NOTE — Procedures (Signed)
Procedure:  Cholangiogram through perc choly tube Findings:  Cystic duct, distal CBD widely patent.  Minimal filling defects in GB.  No extravasation of contrast.

## 2013-12-18 ENCOUNTER — Encounter: Payer: Self-pay | Admitting: Cardiology

## 2013-12-18 ENCOUNTER — Ambulatory Visit (INDEPENDENT_AMBULATORY_CARE_PROVIDER_SITE_OTHER): Payer: Medicare Other | Admitting: Cardiology

## 2013-12-18 VITALS — BP 121/79 | HR 90 | Ht 71.0 in | Wt 260.4 lb

## 2013-12-18 DIAGNOSIS — J449 Chronic obstructive pulmonary disease, unspecified: Secondary | ICD-10-CM

## 2013-12-18 DIAGNOSIS — Z01818 Encounter for other preprocedural examination: Secondary | ICD-10-CM

## 2013-12-18 DIAGNOSIS — N529 Male erectile dysfunction, unspecified: Secondary | ICD-10-CM

## 2013-12-18 NOTE — Assessment & Plan Note (Signed)
Followed by Dr. Byrum.  

## 2013-12-18 NOTE — Assessment & Plan Note (Signed)
Patient states that he has asked his primary care physician Dr. Mellody Drown for a trial of Viagra. As outlined above there is no specific cardiac contraindication for him to try this.

## 2013-12-18 NOTE — Progress Notes (Signed)
Clinical Summary Allen Green is a 63 y.o.male last seen in the office in May 2012 for a preoperative consultation. He has had no regular cardiology followup. He tells me that he asked his primary care physician Dr. Mellody Drown if he could take Viagra, and was told to check with [place] first. As noted below, he does not have any clearly diagnosed obstructive CAD or history of myocardial infarction. He is not on any antihypertensives, and is not on any nitrates. He follows with Dr. Lamonte Sakai for management of his COPD, has had recent problems with cholecystitis, status post percutaneous drainage.  Recent lab work in April showed potassium 4.6, BUN 16, creatinine 0.8, hemoglobin 13.9, platelets 320.  Echocardiogram from July 2012 showed mild LVH with LVEF 65-03%, diastolic dysfunction, mild left atrial enlargement, mild mitral regurgitation, sclerotic aortic valve without stenosis, mildly to moderately dilated right ventricle with moderately reduced function, mild right atrial enlargement, mild tricuspid regurgitation with normal RVSP. Lexiscan Cardiolite from July 2012 indicated LVEF 46% with no abuse ischemic defects, soft tissue attenuation noted, increased RV uptake likely consistent with the patient's chronic lung disease.  I reviewed his ECG from April finding sinus rhythm with borderline low-voltage in the limb leads. No significant ST segment changes. He denies any significant chest pain, has stable dyspnea, uses oxygen and MDIs.   No Known Allergies  Current Outpatient Prescriptions  Medication Sig Dispense Refill  . albuterol (PROVENTIL HFA;VENTOLIN HFA) 108 (90 BASE) MCG/ACT inhaler Inhale 2 puffs into the lungs every 4 (four) hours as needed for wheezing or shortness of breath.      . clobetasol ointment (TEMOVATE) 5.46 % Apply 1 application topically 2 (two) times daily as needed (rash).      . fluticasone (FLONASE) 50 MCG/ACT nasal spray Place 2 sprays into both nostrils daily as needed for  allergies or rhinitis.      [name] metroNIDAZOLE (FLAGYL) 500 MG tablet Take 1 tablet (500 mg total) by mouth 3 (three) times daily.  30 tablet  0  . mometasone-formoterol (DULERA) 200-5 MCG/ACT AERO Inhale 2 puffs into the lungs 2 (two) times daily.  3 Inhaler  3  . tiotropium (SPIRIVA HANDIHALER) 18 MCG inhalation capsule Place 1 capsule (18 mcg total) into inhaler and inhale daily.  30 capsule  6   No current facility-administered medications for this visit.    Past Medical History  Diagnosis Date  . COPD (chronic obstructive pulmonary disease)     Probable - long-standing tobacco abuse  . Chronic pain     Bilateral knees and shoulders  . Pneumonia     Managed as outpatient  . GERD (gastroesophageal reflux disease)   . 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 [name]    Social History Allen Green reports that he has been smoking Cigarettes.  He started smoking about 51 years ago. He has a 100 pack-year smoking history. He has never used smokeless tobacco. Allen Green reports that he does not drink alcohol.  Review of Systems As outlined above. Intermittent nausea since trouble with his gallbladder. No leg edema, orthopnea or PND.  Physical Examination Filed Vitals:   12/18/13 1612  BP: 121/79  Pulse: 90   Filed Weights   12/18/13 1612  Weight: 260 lb 6.4 oz (118.117 kg)   Overweight, chronically ill-appearing male wearing oxygen. HEENT: Conjunctiva and lids normal, oropharynx clear. Neck: Supple, no elevated JVP or carotid bruits, no thyromegaly. Lungs: Decreased breath sounds without wheezing, nonlabored breathing at rest. Cardiac:  Regular rate and rhythm, no S3 or significant systolic murmur, no pericardial rub. Abdomen: Soft, nontender,bowel sounds present, percutaneous drain with bag. Extremities: No pitting edema, distal pulses 2+. Skin: Warm and dry. Musculoskeletal: No kyphosis. Neuropsychiatric: Alert and oriented x3, affect grossly appropriate.   Problem List and Plan    Preoperative evaluation to rule out surgical contraindication Patient has no definitive history of obstructive CAD or myocardial infarction, underwent cardiac testing in 2012 as outlined above. We have not been following him regularly since that time. I expect that he should be able to proceed with cholecystectomy if this is the necessary course after his episode of cholecystitis with percutaneous drainage. Would check with Dr. Lamonte Sakai regarding the patient's COPD status on oxygen. No additional cardiac testing is anticipated at this time.  Erectile dysfunction Patient states that he has asked his primary care physician Dr. Mellody Drown for a trial of Viagra. As outlined above there is no specific cardiac contraindication for him to try this.  COPD (chronic obstructive pulmonary disease) Followed by Dr. Lamonte Sakai.    Satira Sark, M.D., F.A.C.C.

## 2013-12-18 NOTE — Assessment & Plan Note (Signed)
Patient has no definitive history of obstructive CAD or myocardial infarction, underwent cardiac testing in 2012 as outlined above. We have not been following him regularly since that time. I expect that he should be able to proceed with cholecystectomy if this is the necessary course after his episode of cholecystitis with percutaneous drainage. Would check with Dr. Lamonte Sakai regarding the patient's COPD status on oxygen. No additional cardiac testing is anticipated at this time.

## 2013-12-18 NOTE — Patient Instructions (Signed)
Your physician recommends that you schedule a follow-up appointment in: as needed only. Your physician recommends that you continue on your current medications as directed. Please refer to the Current Medication list given to you today.

## 2013-12-21 ENCOUNTER — Encounter (INDEPENDENT_AMBULATORY_CARE_PROVIDER_SITE_OTHER): Payer: Self-pay | Admitting: General Surgery

## 2013-12-21 ENCOUNTER — Ambulatory Visit (INDEPENDENT_AMBULATORY_CARE_PROVIDER_SITE_OTHER): Payer: Medicare Other | Admitting: General Surgery

## 2013-12-21 VITALS — BP 142/76 | HR 80 | Temp 97.8°F | Resp 18 | Wt 258.6 lb

## 2013-12-21 DIAGNOSIS — K810 Acute cholecystitis: Secondary | ICD-10-CM

## 2013-12-21 NOTE — Progress Notes (Signed)
Chief complaint: Followup cholecystostomy tube  History: Patient returns to the office several weeks after placement of percutaneous cholecystostomy tube for acute cholecystitis in the setting of severe COPD. He denies any abdominal complaints. Catheter injection showed a patent cystic duct.  Exam: BP 142/76  Pulse 80  Temp(Src) 97.8 F (36.6 C)  Resp 18  Wt 258 lb 9.6 oz (117.3 kg) General: Obese in no distress Abdomen: Soft and nontender. Drain site okay with clear bile  Assessment and plan: I went ahead and removed his drain today. He understands he is at some risk for recurrent symptoms. He has significant comorbidity. At this point we will plan watchful waiting and he will call as needed for any recurrent symptoms.

## 2013-12-29 ENCOUNTER — Other Ambulatory Visit: Payer: Self-pay | Admitting: Emergency Medicine

## 2014-01-16 ENCOUNTER — Encounter (INDEPENDENT_AMBULATORY_CARE_PROVIDER_SITE_OTHER): Payer: Self-pay

## 2014-01-24 ENCOUNTER — Encounter (HOSPITAL_COMMUNITY): Payer: Self-pay | Admitting: Emergency Medicine

## 2014-01-24 ENCOUNTER — Emergency Department (HOSPITAL_COMMUNITY): Payer: Medicare Other

## 2014-01-24 ENCOUNTER — Inpatient Hospital Stay (HOSPITAL_COMMUNITY)
Admission: EM | Admit: 2014-01-24 | Discharge: 2014-01-28 | DRG: 445 | Disposition: A | Discharge status: Home Health | Payer: Medicare Other | Attending: Internal Medicine | Admitting: Internal Medicine

## 2014-01-24 DIAGNOSIS — N528 Other male erectile dysfunction: Secondary | ICD-10-CM

## 2014-01-24 DIAGNOSIS — R0902 Hypoxemia: Secondary | ICD-10-CM | POA: Diagnosis present

## 2014-01-24 DIAGNOSIS — J441 Chronic obstructive pulmonary disease with (acute) exacerbation: Secondary | ICD-10-CM

## 2014-01-24 DIAGNOSIS — J449 Chronic obstructive pulmonary disease, unspecified: Secondary | ICD-10-CM | POA: Diagnosis present

## 2014-01-24 DIAGNOSIS — Z720 Tobacco use: Secondary | ICD-10-CM

## 2014-01-24 DIAGNOSIS — D72829 Elevated white blood cell count, unspecified: Secondary | ICD-10-CM | POA: Diagnosis present

## 2014-01-24 DIAGNOSIS — I10 Essential (primary) hypertension: Secondary | ICD-10-CM | POA: Diagnosis present

## 2014-01-24 DIAGNOSIS — J9610 Chronic respiratory failure, unspecified whether with hypoxia or hypercapnia: Secondary | ICD-10-CM | POA: Diagnosis present

## 2014-01-24 DIAGNOSIS — K8000 Calculus of gallbladder with acute cholecystitis without obstruction: Principal | ICD-10-CM | POA: Diagnosis present

## 2014-01-24 DIAGNOSIS — R112 Nausea with vomiting, unspecified: Secondary | ICD-10-CM

## 2014-01-24 DIAGNOSIS — J438 Other emphysema: Secondary | ICD-10-CM

## 2014-01-24 DIAGNOSIS — Z01818 Encounter for other preprocedural examination: Secondary | ICD-10-CM

## 2014-01-24 DIAGNOSIS — Z96659 Presence of unspecified artificial knee joint: Secondary | ICD-10-CM

## 2014-01-24 DIAGNOSIS — K810 Acute cholecystitis: Secondary | ICD-10-CM

## 2014-01-24 DIAGNOSIS — Z8249 Family history of ischemic heart disease and other diseases of the circulatory system: Secondary | ICD-10-CM

## 2014-01-24 DIAGNOSIS — J189 Pneumonia, unspecified organism: Secondary | ICD-10-CM

## 2014-01-24 DIAGNOSIS — R9431 Abnormal electrocardiogram [ECG] [EKG]: Secondary | ICD-10-CM

## 2014-01-24 DIAGNOSIS — J4489 Other specified chronic obstructive pulmonary disease: Secondary | ICD-10-CM | POA: Diagnosis present

## 2014-01-24 DIAGNOSIS — Z9981 Dependence on supplemental oxygen: Secondary | ICD-10-CM

## 2014-01-24 DIAGNOSIS — J3089 Other allergic rhinitis: Secondary | ICD-10-CM

## 2014-01-24 DIAGNOSIS — D751 Secondary polycythemia: Secondary | ICD-10-CM | POA: Diagnosis present

## 2014-01-24 DIAGNOSIS — G8929 Other chronic pain: Secondary | ICD-10-CM | POA: Diagnosis present

## 2014-01-24 DIAGNOSIS — K219 Gastro-esophageal reflux disease without esophagitis: Secondary | ICD-10-CM | POA: Diagnosis present

## 2014-01-24 DIAGNOSIS — F17200 Nicotine dependence, unspecified, uncomplicated: Secondary | ICD-10-CM | POA: Diagnosis present

## 2014-01-24 DIAGNOSIS — Z6835 Body mass index (BMI) 35.0-35.9, adult: Secondary | ICD-10-CM

## 2014-01-24 DIAGNOSIS — Z833 Family history of diabetes mellitus: Secondary | ICD-10-CM

## 2014-01-24 LAB — COMPREHENSIVE METABOLIC PANEL
ALBUMIN: 4.2 g/dL (ref 3.5–5.2)
ALK PHOS: 89 U/L (ref 39–117)
ALT: 21 U/L (ref 0–53)
AST: 21 U/L (ref 0–37)
BILIRUBIN TOTAL: 0.3 mg/dL (ref 0.3–1.2)
BUN: 15 mg/dL (ref 6–23)
CO2: 24 meq/L (ref 19–32)
CREATININE: 0.89 mg/dL (ref 0.50–1.35)
Calcium: 9.9 mg/dL (ref 8.4–10.5)
Chloride: 99 mEq/L (ref 96–112)
GFR calc Af Amer: >90 mL/min (ref >90)
GFR, EST NON AFRICAN AMERICAN: 90 mL/min — AB (ref >90)
Glucose, Bld: 115 mg/dL — ABNORMAL HIGH (ref 70–99)
POTASSIUM: 4.5 meq/L (ref 3.7–5.3)
Sodium: 140 mEq/L (ref 137–147)
Total Protein: 8.2 g/dL (ref 6.0–8.3)

## 2014-01-24 LAB — CBC WITH DIFFERENTIAL/PLATELET
Basophils Absolute: 0.1 10*3/uL (ref 0.0–0.1)
Basophils Relative: 0 % (ref 0–1)
Eosinophils Absolute: 0.3 10*3/uL (ref 0.0–0.7)
Eosinophils Relative: 1 % (ref 0–5)
HEMATOCRIT: 49.1 % (ref 39.0–52.0)
HEMOGLOBIN: 17.1 g/dL — AB (ref 13.0–17.0)
LYMPHS ABS: 2.3 10*3/uL (ref 0.7–4.0)
LYMPHS PCT: 11 % — AB (ref 12–46)
MCH: 31.7 pg (ref 26.0–34.0)
MCHC: 34.8 g/dL (ref 30.0–36.0)
MCV: 90.9 fL (ref 78.0–100.0)
MONO ABS: 1.3 10*3/uL — AB (ref 0.1–1.0)
MONOS PCT: 6 % (ref 3–12)
NEUTROS ABS: 17.4 10*3/uL — AB (ref 1.7–7.7)
NEUTROS PCT: 82 % — AB (ref 43–77)
Platelets: 361 10*3/uL (ref 150–400)
RBC: 5.4 MIL/uL (ref 4.22–5.81)
RDW: 13.7 % (ref 11.5–15.5)
WBC: 21.4 10*3/uL — AB (ref 4.0–10.5)

## 2014-01-24 LAB — I-STAT TROPONIN, ED: TROPONIN I, POC: 0 ng/mL (ref 0.00–0.08)

## 2014-01-24 LAB — LIPASE, BLOOD: LIPASE: 83 U/L — AB (ref 11–59)

## 2014-01-24 MED ORDER — IOHEXOL 300 MG/ML  SOLN
100.0000 mL | Freq: Once | INTRAMUSCULAR | Status: AC | PRN
  Administered 2014-01-24: 100 mL via INTRAVENOUS

## 2014-01-24 MED ORDER — ONDANSETRON HCL 4 MG/2ML IJ SOLN: 4.0000 mg | Freq: Four times a day (QID) | INTRAMUSCULAR | Status: DC | PRN

## 2014-01-24 MED ORDER — PIPERACILLIN-TAZOBACTAM 3.375 G IVPB 30 MIN
3.3750 g | INTRAVENOUS | Status: AC
  Administered 2014-01-24: 3.375 g via INTRAVENOUS
  Filled 2014-01-24: qty 50

## 2014-01-24 MED ORDER — PIPERACILLIN-TAZOBACTAM 3.375 G IVPB
3.3750 g | Freq: Three times a day (TID) | INTRAVENOUS | Status: DC
  Administered 2014-01-25 – 2014-01-28 (×10): 3.375 g via INTRAVENOUS
  Filled 2014-01-24 (×12): qty 50

## 2014-01-24 MED ORDER — MOMETASONE FURO-FORMOTEROL FUM 200-5 MCG/ACT IN AERO
2.0000 | INHALATION_SPRAY | Freq: Two times a day (BID) | RESPIRATORY_TRACT | Status: DC
  Administered 2014-01-24 – 2014-01-28 (×8): 2 via RESPIRATORY_TRACT
  Filled 2014-01-24: qty 8.8

## 2014-01-24 MED ORDER — ACETAMINOPHEN 650 MG RE SUPP: 650.0000 mg | Freq: Four times a day (QID) | RECTAL | Status: DC | PRN

## 2014-01-24 MED ORDER — CLOBETASOL PROPIONATE 0.05 % EX OINT: 1.0000 "application " | TOPICAL_OINTMENT | Freq: Two times a day (BID) | CUTANEOUS | Status: DC | PRN

## 2014-01-24 MED ORDER — ACETAMINOPHEN 325 MG PO TABS
650.0000 mg | ORAL_TABLET | Freq: Four times a day (QID) | ORAL | Status: DC | PRN
Start: 2014-01-24 — End: 2014-01-28

## 2014-01-24 MED ORDER — ALBUTEROL SULFATE (2.5 MG/3ML) 0.083% IN NEBU
2.5000 mg | INHALATION_SOLUTION | RESPIRATORY_TRACT | Status: DC | PRN
  Administered 2014-01-25 – 2014-01-26 (×4): 2.5 mg via RESPIRATORY_TRACT
  Filled 2014-01-24 (×6): qty 3

## 2014-01-24 MED ORDER — ONDANSETRON HCL 4 MG PO TABS: 4.0000 mg | ORAL_TABLET | Freq: Four times a day (QID) | ORAL | Status: DC | PRN

## 2014-01-24 MED ORDER — TIOTROPIUM BROMIDE MONOHYDRATE 18 MCG IN CAPS
18.0000 ug | ORAL_CAPSULE | Freq: Every day | RESPIRATORY_TRACT | Status: DC
  Administered 2014-01-25 – 2014-01-28 (×4): 18 ug via RESPIRATORY_TRACT
  Filled 2014-01-24: qty 5

## 2014-01-24 MED ORDER — ONDANSETRON HCL 4 MG/2ML IJ SOLN
4.0000 mg | Freq: Once | INTRAMUSCULAR | Status: AC
  Administered 2014-01-24: 4 mg via INTRAVENOUS
  Filled 2014-01-24: qty 2

## 2014-01-24 MED ORDER — SODIUM CHLORIDE 0.9 % IV SOLN
INTRAVENOUS | Status: AC
  Administered 2014-01-24 – 2014-01-25 (×2): via INTRAVENOUS

## 2014-01-24 MED ORDER — MORPHINE SULFATE 2 MG/ML IJ SOLN
2.0000 mg | INTRAMUSCULAR | Status: DC | PRN
  Administered 2014-01-24 – 2014-01-25 (×4): 2 mg via INTRAVENOUS
  Filled 2014-01-24 (×4): qty 1

## 2014-01-24 MED ORDER — SODIUM CHLORIDE 0.9 % IV SOLN: Freq: Once | INTRAVENOUS | Status: DC

## 2014-01-24 MED ORDER — HYDROMORPHONE HCL PF 1 MG/ML IJ SOLN
1.0000 mg | INTRAMUSCULAR | Status: DC | PRN
  Administered 2014-01-24: 1 mg via INTRAVENOUS
  Filled 2014-01-24: qty 1

## 2014-01-24 MED ORDER — HYDROMORPHONE HCL PF 1 MG/ML IJ SOLN
1.0000 mg | Freq: Once | INTRAMUSCULAR | Status: AC
  Administered 2014-01-24: 1 mg via INTRAVENOUS
  Filled 2014-01-24: qty 1

## 2014-01-24 MED ORDER — BIOTENE DRY MOUTH MT LIQD
15.0000 mL | Freq: Two times a day (BID) | OROMUCOSAL | Status: DC
  Administered 2014-01-24 – 2014-01-28 (×6): 15 mL via OROMUCOSAL

## 2014-01-24 NOTE — ED Notes (Addendum)
Pt from home c/o generalized abdominal pain since this a.m. He is also having nausea and vomiting. Dx with gallstones in April but did not have the surgery due to risks. A drain was placed and removed 4 weeks ago. He also c/o increased shortness of breath.

## 2014-01-24 NOTE — ED Notes (Signed)
Admitting nurse at bedside  Patient's wife upset because she was told that patient "was going home." PA currently at bedside

## 2014-01-24 NOTE — ED Notes (Signed)
Pt. Is aware that we need a urine specimen, but is unable to go at this time. Urinal at bedside.

## 2014-01-24 NOTE — ED Notes (Signed)
Pt aware of the need for a urine sample. 

## 2014-01-24 NOTE — ED Notes (Signed)
PA at bedside.
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
## 2014-01-24 NOTE — ED Notes (Signed)
Pt. Made aware for the need of urine. 

## 2014-01-24 NOTE — H&P (Addendum)
[facility] History and Physical  Allen Green RDE:[id number] DOB: 1951/01/16 DOA: 01/24/2014  Referring physician: ER physician. PCP: Linton Rump, PA   Chief Complaint: Abdominal pain.  HPI: Allen Green is a 63 y.o. male who was recently admitted for acute cholecystitis and was managed with a drain which was removed last month by Dr. Excell Seltzer presents to the ER because of abdominal pain nausea vomiting. Patient's symptoms in a.m. after patient had some fruits. Patient has some indigestion like feeling followed by nausea vomiting and epigastric pain. In the ER patient's labs revealed leukocytosis and CAT scan shows distended gallbladder. On exam patient has epigastric and right upper quadrant tenderness. On call surgeon Dr. Hassell Done was consulted and at this time patient admitted for further management. Patient denies any chest pain or shortness of breath has chronic cough denies any headache visual symptoms or any focal deficits or diarrhea. Patient has had cardiac clearance last month for gallbladder surgery. At this time surgery has requested pulmonary consult given patient's COPD.   Review of Systems: As presented in the history of presenting illness, rest negative.  Past Medical History  Diagnosis Date  . COPD (chronic obstructive pulmonary disease)     Probable - long-standing tobacco abuse  . Chronic pain     Bilateral knees and shoulders  . Pneumonia     Managed as outpatient  . GERD (gastroesophageal reflux disease)   . 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 [name]   Past Surgical History  Procedure Laterality Date  . Right total knee arthroplasty  2003  . Left total knee arthroplasty  1998    "left knee replacement is broken" - 08/09/13   Social History:  reports that he has been smoking Cigarettes.  He started smoking about 51 years ago. He has a 100 pack-year smoking history. He has never used smokeless tobacco. He reports that he does not drink alcohol or use illicit  drugs. Where does patient live home. Can patient participate in ADLs? Yes.  No Known Allergies  Family History:  Family History  Problem Relation Age of Onset  . Diabetes type II Father   . Coronary artery disease Father     MI in his 72s  . Diabetes Mother   . Diabetes Brother   . Diabetes Sister   . Asthma Sister       Prior to Admission medications   Medication Sig Start Date End Date Taking? Authorizing Provider  albuterol (PROVENTIL HFA;VENTOLIN HFA) 108 (90 BASE) MCG/ACT inhaler Inhale into the lungs every 4 (four) hours as needed for wheezing or shortness of breath.   Yes Historical Provider, MD  clobetasol ointment (TEMOVATE) 6.31 % Apply 1 application topically 2 (two) times daily as needed (rash).   Yes Historical Provider, MD  mometasone-formoterol (DULERA) 200-5 MCG/ACT AERO Inhale 2 puffs into the lungs 2 (two) times daily. 10/25/13  Yes Collene Gobble, MD  tiotropium (SPIRIVA HANDIHALER) 18 MCG inhalation capsule Place 1 capsule (18 mcg total) into inhaler and inhale daily. 08/01/13 08/01/14 Yes Collene Gobble, MD    Physical Exam: Filed Vitals:   01/24/14 1554 01/24/14 1733 01/24/14 1905  BP: 132/89 134/73   Pulse: 96 91 92  Temp: 97.8 F (36.6 C)    TempSrc: Oral    Resp:  18   Height: 5\' 11"  (1.803 m)    Weight: 115.667 kg (255 lb)    SpO2: 95% 92% 95%     General:  Well-developed and nourished.  Eyes: Anicteric no pallor.  ENT: No discharge from the ears eyes nose mouth.  Neck: No mass felt.  Cardiovascular: S1-S2 heard.  Respiratory: No rhonchi or crepitations.  Abdomen: Soft mild tenderness in epigastric and right upper quadrant area. No guarding rigidity.  Skin: No rash.  Musculoskeletal: No edema.  Psychiatric: Appears normal.  Neurologic: Alert awake oriented to time place person. Moves all extremities.  Labs on Admission:  Basic Metabolic Panel:  Recent Labs Lab 01/24/14 1603  NA 140  K 4.5  CL 99  CO2 24  GLUCOSE 115*   BUN 15  CREATININE 0.89  CALCIUM 9.9   Liver Function Tests:  Recent Labs Lab 01/24/14 1603  AST 21  ALT 21  ALKPHOS 89  BILITOT 0.3  PROT 8.2  ALBUMIN 4.2    Recent Labs Lab 01/24/14 1603  LIPASE 83*   No results found for this basename: AMMONIA,  in the last 168 hours CBC:  Recent Labs Lab 01/24/14 1603  WBC 21.4*  NEUTROABS 17.4*  HGB 17.1*  HCT 49.1  MCV 90.9  PLT 361   Cardiac Enzymes: No results found for this basename: CKTOTAL, CKMB, CKMBINDEX, TROPONINI,  in the last 168 hours  BNP (last 3 results)  Recent Labs  08/09/13 1550  PROBNP 222.0*   CBG: No results found for this basename: GLUCAP,  in the last 168 hours  Radiological Exams on Admission: Ct Abdomen Pelvis W Contrast  01/24/2014   CLINICAL DATA:  Upper abdominal pain. Leukocytosis. Nausea and vomiting. Acute cholecystitis.  EXAM: CT ABDOMEN AND PELVIS WITH CONTRAST  TECHNIQUE: Multidetector CT imaging of the abdomen and pelvis was performed using the standard protocol following bolus administration of intravenous contrast.  CONTRAST:  114mL OMNIPAQUE IOHEXOL 300 MG/ML  SOLN  COMPARISON:  CT scan dated 11/02/2013  FINDINGS: The gallbladder slightly distended and there is increased edema in the gallbladder wall since the prior study. There are no dilated bile ducts. Liver parenchyma, spleen, pancreas, adrenal glands, and kidneys appear normal. The bowel is normal including the terminal ileum and appendix. No free air or free fluid. Fairly extensive calcification in the abdominal aorta with atheromatous irregularity. No acute osseous abnormalities.  IMPRESSION: Increased distention and edema of the gallbladder wall since the prior study. No biliary obstruction.  Otherwise, normal exam.   Electronically Signed   By: Rozetta Nunnery M.D.   On: 01/24/2014 19:10    Assessment/Plan Principal Problem:   Acute cholecystitis Active Problems:   COPD (chronic obstructive pulmonary disease)   Polycythemia  secondary to hypoxia   Leucocytosis   1. Acute cholecystitis - patient has been kept n.p.o. in anticipation of possible surgery. Continue Zosyn. [facility] consult pulmonologist given patient's COPD. 2. COPD on home oxygen - presently not actively wheezing. Continue home inhalers. 3. 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 4. Leukocytosis - probably from #1.  EKG is pending.  Code Status: Full code.  Family Communication: Patient's wife at the bedside.  Disposition Plan: Admit to inpatient.    Christiana Gurevich N. [facility] Pager [phone].  If 7PM-7AM, please contact night-coverage [email or web address] Password [email or web address] 01/24/2014, 8:30 PM

## 2014-01-24 NOTE — ED Notes (Signed)
Pt now reports some burning with urination and states his abdomen is swollen.

## 2014-01-24 NOTE — Progress Notes (Signed)
ANTIBIOTIC CONSULT NOTE - INITIAL  Pharmacy Consult for zosyn Indication: intra-abdominal  No Known Allergies  Patient Measurements: Height: 5\' 11"  (180.3 cm) Weight: 255 lb (115.667 kg) IBW/kg (Calculated) : 75.3 Adjusted Body Weight:   Vital Signs: Temp: 97.8 F (36.6 C) (06/24 1554) Temp src: Oral (06/24 1554) BP: 134/73 mmHg (06/24 1733) Pulse Rate: 92 (06/24 1905) Intake/Output from previous day:   Intake/Output from this shift:    Labs:  Recent Labs  01/24/14 1603  WBC 21.4*  HGB 17.1*  PLT 361  CREATININE 0.89   Estimated Creatinine Clearance: 111.4 ml/min (by C-G formula based on Cr of 0.89). No results found for this basename: VANCOTROUGH, VANCOPEAK, VANCORANDOM, GENTTROUGH, GENTPEAK, GENTRANDOM, TOBRATROUGH, TOBRAPEAK, TOBRARND, AMIKACINPEAK, AMIKACINTROU, AMIKACIN,  in the last 72 hours   Microbiology: No results found for this or any previous visit (from the past 720 hour(s)).  Medical History: Past Medical History  Diagnosis Date  . COPD (chronic obstructive pulmonary disease)     Probable - long-standing tobacco abuse  . Chronic pain     Bilateral knees and shoulders  . Pneumonia     Managed as outpatient  . GERD (gastroesophageal reflux disease)   . 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 [name]    Assessment: 50 YOM admitted with abdominal pain, N/V.  CT of abdomen reveals increased distention and abdomen.  6/24 >> zosyn  >>  Tmax: afeb WBCs: 21.4 Renal: Scr = 0.89  Goal of Therapy:  Dose for renal function and indiation  Plan:   Zosyn 3.375gm IV over 31min in ED then 3.375gm IV q8h over 4h infusion  Doreene Eland, PharmD, BCPS.   Pager: [phone]  01/24/2014,8:01 PM

## 2014-01-24 NOTE — ED Notes (Signed)
Dr. Raliegh Ip (Admitting MD at bedside)

## 2014-01-25 ENCOUNTER — Inpatient Hospital Stay (HOSPITAL_COMMUNITY): Payer: Medicare Other | Admitting: Anesthesiology

## 2014-01-25 ENCOUNTER — Encounter (HOSPITAL_COMMUNITY)
Admission: EM | Disposition: A | Discharge status: Home Health | Payer: Self-pay | Source: Home / Self Care | Attending: Internal Medicine

## 2014-01-25 ENCOUNTER — Encounter (HOSPITAL_COMMUNITY): Payer: Medicare Other | Admitting: Anesthesiology

## 2014-01-25 ENCOUNTER — Encounter (HOSPITAL_COMMUNITY): Payer: Self-pay | Admitting: General Surgery

## 2014-01-25 ENCOUNTER — Inpatient Hospital Stay (HOSPITAL_COMMUNITY): Payer: Medicare Other

## 2014-01-25 DIAGNOSIS — R112 Nausea with vomiting, unspecified: Secondary | ICD-10-CM

## 2014-01-25 DIAGNOSIS — J189 Pneumonia, unspecified organism: Secondary | ICD-10-CM

## 2014-01-25 DIAGNOSIS — R1011 Right upper quadrant pain: Secondary | ICD-10-CM

## 2014-01-25 DIAGNOSIS — D72829 Elevated white blood cell count, unspecified: Secondary | ICD-10-CM

## 2014-01-25 DIAGNOSIS — R197 Diarrhea, unspecified: Secondary | ICD-10-CM

## 2014-01-25 DIAGNOSIS — J441 Chronic obstructive pulmonary disease with (acute) exacerbation: Secondary | ICD-10-CM

## 2014-01-25 LAB — BASIC METABOLIC PANEL
BUN: 16 mg/dL (ref 6–23)
CHLORIDE: 99 meq/L (ref 96–112)
CO2: 26 mEq/L (ref 19–32)
CREATININE: 0.96 mg/dL (ref 0.50–1.35)
Calcium: 9.1 mg/dL (ref 8.4–10.5)
GFR calc non Af Amer: 87 mL/min — ABNORMAL LOW (ref >90)
GLUCOSE: 128 mg/dL — AB (ref 70–99)
Potassium: 4.5 mEq/L (ref 3.7–5.3)
Sodium: 139 mEq/L (ref 137–147)

## 2014-01-25 LAB — PROTIME-INR
INR: 1.17 (ref 0.00–1.49)
Prothrombin Time: 14.9 seconds (ref 11.6–15.2)

## 2014-01-25 LAB — APTT: aPTT: 47 seconds — ABNORMAL HIGH (ref 24–37)

## 2014-01-25 LAB — CBC
HCT: 47.7 % (ref 39.0–52.0)
Hemoglobin: 16 g/dL (ref 13.0–17.0)
MCH: 31.1 pg (ref 26.0–34.0)
MCHC: 33.5 g/dL (ref 30.0–36.0)
MCV: 92.8 fL (ref 78.0–100.0)
Platelets: 319 10*3/uL (ref 150–400)
RBC: 5.14 MIL/uL (ref 4.22–5.81)
RDW: 14 % (ref 11.5–15.5)
WBC: 24.2 10*3/uL — ABNORMAL HIGH (ref 4.0–10.5)

## 2014-01-25 LAB — URINALYSIS, ROUTINE W REFLEX MICROSCOPIC
Bilirubin Urine: NEGATIVE
GLUCOSE, UA: NEGATIVE mg/dL
Hgb urine dipstick: NEGATIVE
Ketones, ur: NEGATIVE mg/dL
Leukocytes, UA: NEGATIVE
Nitrite: NEGATIVE
Protein, ur: NEGATIVE mg/dL
UROBILINOGEN UA: 0.2 mg/dL (ref 0.0–1.0)
pH: 6.5 (ref 5.0–8.0)

## 2014-01-25 LAB — HEPATIC FUNCTION PANEL
ALBUMIN: 3.5 g/dL (ref 3.5–5.2)
ALT: 17 U/L (ref 0–53)
AST: 17 U/L (ref 0–37)
Alkaline Phosphatase: 76 U/L (ref 39–117)
Total Bilirubin: 0.6 mg/dL (ref 0.3–1.2)
Total Protein: 7.3 g/dL (ref 6.0–8.3)

## 2014-01-25 LAB — GLUCOSE, CAPILLARY
GLUCOSE-CAPILLARY: 117 mg/dL — AB (ref 70–99)
Glucose-Capillary: 110 mg/dL — ABNORMAL HIGH (ref 70–99)
Glucose-Capillary: 128 mg/dL — ABNORMAL HIGH (ref 70–99)
Glucose-Capillary: 134 mg/dL — ABNORMAL HIGH (ref 70–99)

## 2014-01-25 SURGERY — LAPAROSCOPIC CHOLECYSTECTOMY WITH INTRAOPERATIVE CHOLANGIOGRAM
Anesthesia: General

## 2014-01-25 MED ORDER — HYDROMORPHONE HCL PF 1 MG/ML IJ SOLN
INTRAMUSCULAR | Status: AC
  Filled 2014-01-25: qty 1

## 2014-01-25 MED ORDER — PANTOPRAZOLE SODIUM 40 MG IV SOLR
40.0000 mg | Freq: Two times a day (BID) | INTRAVENOUS | Status: DC
  Administered 2014-01-25 – 2014-01-28 (×6): 40 mg via INTRAVENOUS
  Filled 2014-01-25 (×8): qty 40

## 2014-01-25 MED ORDER — LIDOCAINE-EPINEPHRINE (PF) 2 %-1:200000 IJ SOLN
INTRAMUSCULAR | Status: AC
  Filled 2014-01-25: qty 20

## 2014-01-25 MED ORDER — HYDROMORPHONE HCL PF 1 MG/ML IJ SOLN
0.5000 mg | INTRAMUSCULAR | Status: DC | PRN
  Administered 2014-01-25 – 2014-01-27 (×7): 0.5 mg via INTRAVENOUS
  Filled 2014-01-25 (×7): qty 1

## 2014-01-25 MED ORDER — MIDAZOLAM HCL 2 MG/2ML IJ SOLN
INTRAMUSCULAR | Status: AC
  Filled 2014-01-25: qty 6

## 2014-01-25 MED ORDER — MIDAZOLAM HCL 2 MG/2ML IJ SOLN
INTRAMUSCULAR | Status: AC | PRN
  Administered 2014-01-25: 1 mg via INTRAVENOUS

## 2014-01-25 MED ORDER — FENTANYL CITRATE 0.05 MG/ML IJ SOLN
INTRAMUSCULAR | Status: AC | PRN
  Administered 2014-01-25: 50 ug via INTRAVENOUS

## 2014-01-25 MED ORDER — IOHEXOL 300 MG/ML  SOLN
25.0000 mL | Freq: Once | INTRAMUSCULAR | Status: AC | PRN
  Administered 2014-01-25: 25 mL

## 2014-01-25 MED ORDER — FENTANYL CITRATE 0.05 MG/ML IJ SOLN
INTRAMUSCULAR | Status: AC
  Filled 2014-01-25: qty 6

## 2014-01-25 NOTE — Procedures (Signed)
Successful [place] and fluoroscopic guided placement of 10 Fr cholecystotomy tube.  No immediate complications.

## 2014-01-25 NOTE — Consult Note (Signed)
Reason for Consult: acute cholecystitis  Referring Physician: Dr. Leisa Lenz   HPI: Allen Green is a 63 year old male with a history of HTN, COPD, home 02, current 2 PPD smoker, acute cholecystitis treated with percutaneous cholecystostomy tube in April due to his severe COPD.  He was doing fairly well and followed up with Dr. Excell Seltzer, drain was injected and subsequently removed.  He was doing well until yesterday afternoon at which time he developed abdominal pain, nausea and vomiting.  Location is RUQ without radiation, "band like."  Characterized as sharp pain.  Time pattern is constant.  Moderate in severity.  No aggravating factors.  Alleviated with pain medication.  No modifying factors.  He denies fever, sweats.  He complains of chills this morning.  Denies further emesis.  He did take MOM and had an episode of diarrhea, non bloody.  He denies chest pains, he has been cleared by cardiology.   Work up in the ED showed increased distention and edema of the gallbladder on CT of abdomen and pelvis.  White count of 24K.  Normal LFTs.  He has been NPO since yesterday evening.  He is taking zosyn.  We have been asked to evaluate the patient once again for recurrent cholecystitis.  Past Medical History  Diagnosis Date  . COPD (chronic obstructive pulmonary disease)     Probable - long-standing tobacco abuse  . Chronic pain     Bilateral knees and shoulders  . Pneumonia     Managed as outpatient  . GERD (gastroesophageal reflux disease)   . 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 [name]    Past Surgical History  Procedure Laterality Date  . Right total knee arthroplasty  2003  . Left total knee arthroplasty  1998    "left knee replacement is broken" - 08/09/13    Family History  Problem Relation Age of Onset  . Diabetes type II Father   . Coronary artery disease Father     MI in his 33s  . Diabetes Mother   . Diabetes Brother   . Diabetes Sister   . Asthma Sister     Social History:   reports that he has been smoking Cigarettes.  He started smoking about 51 years ago. He has a 100 pack-year smoking history. He has never used smokeless tobacco. He reports that he does not drink alcohol or use illicit drugs.  Allergies: No Known Allergies  Medications:  Scheduled Meds: . antiseptic oral rinse  15 mL Mouth Rinse BID  . mometasone-formoterol  2 puff Inhalation BID  . pantoprazole (PROTONIX) IV  40 mg Intravenous Q12H  . piperacillin-tazobactam (ZOSYN)  IV  3.375 g Intravenous 3 times per day  . tiotropium  18 mcg Inhalation Daily   Continuous Infusions: . sodium chloride 75 mL/hr at 01/24/14 2123   PRN Meds:.acetaminophen, acetaminophen, albuterol, clobetasol ointment, morphine injection, ondansetron (ZOFRAN) IV, ondansetron   Results for orders placed during the hospital encounter of 01/24/14 (from the past 48 hour(s))  CBC WITH DIFFERENTIAL     Status: Abnormal   Collection Time    01/24/14  4:03 PM      Result Value Ref Range   WBC 21.4 (*) 4.0 - 10.5 K/uL   RBC 5.40  4.22 - 5.81 MIL/uL   Hemoglobin 17.1 (*) 13.0 - 17.0 g/dL   HCT 49.1  39.0 - 52.0 %   MCV 90.9  78.0 - 100.0 fL   MCH 31.7  26.0 - 34.0  pg   MCHC 34.8  30.0 - 36.0 g/dL   RDW 13.7  11.5 - 15.5 %   Platelets 361  150 - 400 K/uL   Neutrophils Relative % 82 (*) 43 - 77 %   Neutro Abs 17.4 (*) 1.7 - 7.7 K/uL   Lymphocytes Relative 11 (*) 12 - 46 %   Lymphs Abs 2.3  0.7 - 4.0 K/uL   Monocytes Relative 6  3 - 12 %   Monocytes Absolute 1.3 (*) 0.1 - 1.0 K/uL   Eosinophils Relative 1  0 - 5 %   Eosinophils Absolute 0.3  0.0 - 0.7 K/uL   Basophils Relative 0  0 - 1 %   Basophils Absolute 0.1  0.0 - 0.1 K/uL  COMPREHENSIVE METABOLIC PANEL     Status: Abnormal   Collection Time    01/24/14  4:03 PM      Result Value Ref Range   Sodium 140  137 - 147 mEq/L   Potassium 4.5  3.7 - 5.3 mEq/L   Chloride 99  96 - 112 mEq/L   CO2 24  19 - 32 mEq/L   Glucose, Bld 115 (*) 70 - 99 mg/dL   BUN 15  6 - 23  mg/dL   Creatinine, Ser 0.89  0.50 - 1.35 mg/dL   Calcium 9.9  8.4 - 10.5 mg/dL   Total Protein 8.2  6.0 - 8.3 g/dL   Albumin 4.2  3.5 - 5.2 g/dL   AST 21  0 - 37 U/L   ALT 21  0 - 53 U/L   Alkaline Phosphatase 89  39 - 117 U/L   Total Bilirubin 0.3  0.3 - 1.2 mg/dL   GFR calc non Af Amer 90 (*) >90 mL/min   GFR calc Af Amer >90  >90 mL/min   Comment: (NOTE)     The eGFR has been calculated using the CKD EPI equation.     This calculation has not been validated in all clinical situations.     eGFR's persistently <90 mL/min signify possible Chronic Kidney     Disease.  LIPASE, BLOOD     Status: Abnormal   Collection Time    01/24/14  4:03 PM      Result Value Ref Range   Lipase 83 (*) 11 - 59 U/L  I-STAT TROPOININ, ED     Status: None   Collection Time    01/24/14  4:10 PM      Result Value Ref Range   Troponin i, poc 0.00  0.00 - 0.08 ng/mL   Comment 3            Comment: Due to the release kinetics of cTnI,     a negative result within the first hours     of the onset of symptoms does not rule out     myocardial infarction with certainty.     If myocardial infarction is still suspected,     repeat the test at appropriate intervals.  GLUCOSE, CAPILLARY     Status: Abnormal   Collection Time    01/25/14 12:03 AM      Result Value Ref Range   Glucose-Capillary 128 (*) 70 - 99 mg/dL  URINALYSIS, ROUTINE W REFLEX MICROSCOPIC     Status: Abnormal   Collection Time    01/25/14  4:22 AM      Result Value Ref Range   Color, Urine YELLOW  YELLOW   APPearance CLEAR  CLEAR   Specific  Gravity, Urine >1.046 (*) 1.005 - 1.030   pH 6.5  5.0 - 8.0   Glucose, UA NEGATIVE  NEGATIVE mg/dL   Hgb urine dipstick NEGATIVE  NEGATIVE   Bilirubin Urine NEGATIVE  NEGATIVE   Ketones, ur NEGATIVE  NEGATIVE mg/dL   Protein, ur NEGATIVE  NEGATIVE mg/dL   Urobilinogen, UA 0.2  0.0 - 1.0 mg/dL   Nitrite NEGATIVE  NEGATIVE   Leukocytes, UA NEGATIVE  NEGATIVE   Comment: MICROSCOPIC NOT DONE ON  URINES WITH NEGATIVE PROTEIN, BLOOD, LEUKOCYTES, NITRITE, OR GLUCOSE <1000 mg/dL.  HEPATIC FUNCTION PANEL     Status: None   Collection Time    01/25/14  5:16 AM      Result Value Ref Range   Total Protein 7.3  6.0 - 8.3 g/dL   Albumin 3.5  3.5 - 5.2 g/dL   AST 17  0 - 37 U/L   ALT 17  0 - 53 U/L   Alkaline Phosphatase 76  39 - 117 U/L   Total Bilirubin 0.6  0.3 - 1.2 mg/dL   Bilirubin, Direct <0.2  0.0 - 0.3 mg/dL   Indirect Bilirubin NOT CALCULATED  0.3 - 0.9 mg/dL  BASIC METABOLIC PANEL     Status: Abnormal   Collection Time    01/25/14  5:16 AM      Result Value Ref Range   Sodium 139  137 - 147 mEq/L   Potassium 4.5  3.7 - 5.3 mEq/L   Chloride 99  96 - 112 mEq/L   CO2 26  19 - 32 mEq/L   Glucose, Bld 128 (*) 70 - 99 mg/dL   BUN 16  6 - 23 mg/dL   Creatinine, Ser 0.96  0.50 - 1.35 mg/dL   Calcium 9.1  8.4 - 10.5 mg/dL   GFR calc non Af Amer 87 (*) >90 mL/min   GFR calc Af Amer >90  >90 mL/min   Comment: (NOTE)     The eGFR has been calculated using the CKD EPI equation.     This calculation has not been validated in all clinical situations.     eGFR's persistently <90 mL/min signify possible Chronic Kidney     Disease.  CBC     Status: Abnormal   Collection Time    01/25/14  5:16 AM      Result Value Ref Range   WBC 24.2 (*) 4.0 - 10.5 K/uL   RBC 5.14  4.22 - 5.81 MIL/uL   Hemoglobin 16.0  13.0 - 17.0 g/dL   HCT 47.7  39.0 - 52.0 %   MCV 92.8  78.0 - 100.0 fL   MCH 31.1  26.0 - 34.0 pg   MCHC 33.5  30.0 - 36.0 g/dL   RDW 14.0  11.5 - 15.5 %   Platelets 319  150 - 400 K/uL  GLUCOSE, CAPILLARY     Status: Abnormal   Collection Time    01/25/14  5:58 AM      Result Value Ref Range   Glucose-Capillary 134 (*) 70 - 99 mg/dL    Ct Abdomen Pelvis W Contrast  01/24/2014   CLINICAL DATA:  Upper abdominal pain. Leukocytosis. Nausea and vomiting. Acute cholecystitis.  EXAM: CT ABDOMEN AND PELVIS WITH CONTRAST  TECHNIQUE: Multidetector CT imaging of the abdomen and pelvis  was performed using the standard protocol following bolus administration of intravenous contrast.  CONTRAST:  112m OMNIPAQUE IOHEXOL 300 MG/ML  SOLN  COMPARISON:  CT scan dated 11/02/2013  FINDINGS: The  gallbladder slightly distended and there is increased edema in the gallbladder wall since the prior study. There are no dilated bile ducts. Liver parenchyma, spleen, pancreas, adrenal glands, and kidneys appear normal. The bowel is normal including the terminal ileum and appendix. No free air or free fluid. Fairly extensive calcification in the abdominal aorta with atheromatous irregularity. No acute osseous abnormalities.  IMPRESSION: Increased distention and edema of the gallbladder wall since the prior study. No biliary obstruction.  Otherwise, normal exam.   Electronically Signed   By: Rozetta Nunnery M.D.   On: 01/24/2014 19:10    Review of Systems  All other systems reviewed and are negative.  Blood pressure 135/84, pulse 100, temperature 98.9 F (37.2 C), temperature source Oral, resp. rate 18, height _0  (1.803 m), weight 255 lb (115.667 kg), SpO2 95.00%. Physical Exam  Constitutional: He appears well-developed and well-nourished. No distress.  Cardiovascular: Normal rate, regular rhythm, normal heart sounds and intact distal pulses.  Exam reveals no gallop and no friction rub.   No murmur heard. Respiratory: Effort normal and breath sounds normal. No respiratory distress. He has no rales. He exhibits no tenderness.  GI: Soft. He exhibits no distension and no mass. There is no rebound and no guarding.  +murphy's sign  Musculoskeletal: Normal range of motion. He exhibits no edema and no tenderness.  Skin: Skin is warm and dry. No rash noted. He is not diaphoretic. No erythema. No pallor.  Psychiatric: He has a normal mood and affect. His behavior is normal. Judgment and thought content normal.    Assessment/Plan: COPD Acute cholecystitis failed percutaneous cholecystostomy  tube Leukocytosis -he would benefit from surgical intervention, will await pulmonary evaluation given the severity of COPD -agree with antibiotics -keep him NPO for now -pani control -IV hydration   RIEBOCK, EMINA ANP-BC Pager [phone] 01/25/2014, 9:17 AM

## 2014-01-25 NOTE — Progress Notes (Signed)
Patient ID: Allen Green, male   DOB: 1950/11/16, 63 y.o.   MRN: [id number] [facility] PROGRESS NOTE  JADA KUHNERT NGE:[id number] DOB: [birth year] DOA: 01/24/2014 PCP: Linton Rump, PA  Brief narrative: 63 y.o. male with past medical history of COPD, recent acute cholecystitis managed with a drain removed about a month ago PTA who presented to [facility] ED 01/24/2014 with worsening abdominal pain in right upper abdominal quadrant associated with nausea and vomiting. In ED, vitals are stable. Ct abdomen showed distended gallbladder. Pt was started on empiric zosyn for treatment of acute cholecystitis. Surgery consulted.   Assessment/Plan:  Principal Problem:   Acute cholecystitis  Pt elected to have PCT placement first; IR to insert PCT  Surgery following  Continue zosyn per pharmacy dosing  Continue IV fluids, morphine 2 mg IV every 4 hours PRN  Active Problems:   COPD (chronic obstructive pulmonary disease)  Stable at this time; pulmonary consulted as initial thought was that pt will go for lap chole  Continue oxygen support via [place] to keep O2 saturation above 90%  Continue dulera and spiriva   Leukocytosis  Secondary to acute cholecystitis  Continue zosyn for now  Plan for PCT drain placement today    DVT prophylaxis: SCD's bilaterally  GI prophylaxis: Protonix IV Q 12 hours   Code Status: full code  Family Communication: plan of care discussed with the patient Disposition Plan: home when stable   Leisa Lenz, MD  [facility] Pager [phone]  If 7PM-7AM, please contact night-coverage [email or web address] Password [email or web address] 01/25/2014, 1:37 PM   LOS: 1 day   Consultants:  Pulmonary for medical clearance  Surgery  Interventional radiology  Procedures:  Percutaneous cholecystostomy planned for 01/25/2014   Antibiotics:  Zosyn 01/24/2014 -->  HPI/Subjective: No acute overnight events.  Objective: Filed Vitals:   01/24/14 1905 01/24/14 2059 01/24/14 2142  01/25/14 0600  BP:  116/84  135/84  Pulse: 92 100  100  Temp:  98.2 F (36.8 C)  98.9 F (37.2 C)  TempSrc:  Oral  Oral  Resp:  18  18  Height:      Weight:      SpO2: 95% 92% 91% 95%    Intake/Output Summary (Last 24 hours) at 01/25/14 1337 Last data filed at 01/25/14 0600  Gross per 24 hour  Intake 646.25 ml  Output    925 ml  Net -278.75 ml    Exam:   General:  Pt is alert, follows commands appropriately, not in acute distress  Cardiovascular: Regular rate and rhythm, S1/S2, no murmurs  Respiratory: Clear to auscultation bilaterally, no wheezing, no crackles, no rhonchi  Abdomen: tender in right upper quadrant, distended, bowel sounds present  Extremities: No edema, pulses DP and PT palpable bilaterally  Neuro: Grossly nonfocal  Data Reviewed: Basic Metabolic Panel:  Recent Labs Lab 01/24/14 1603 01/25/14 0516  NA 140 139  K 4.5 4.5  CL 99 99  CO2 24 26  GLUCOSE 115* 128*  BUN 15 16  CREATININE 0.89 0.96  CALCIUM 9.9 9.1   Liver Function Tests:  Recent Labs Lab 01/24/14 1603 01/25/14 0516  AST 21 17  ALT 21 17  ALKPHOS 89 76  BILITOT 0.3 0.6  PROT 8.2 7.3  ALBUMIN 4.2 3.5    Recent Labs Lab 01/24/14 1603  LIPASE 83*   No results found for this basename: AMMONIA,  in the last 168 hours CBC:  Recent Labs Lab 01/24/14 1603 01/25/14 0516  WBC 21.4* 24.2*  NEUTROABS  17.4*  --   HGB 17.1* 16.0  HCT 49.1 47.7  MCV 90.9 92.8  PLT 361 319   Cardiac Enzymes: No results found for this basename: CKTOTAL, CKMB, CKMBINDEX, TROPONINI,  in the last 168 hours BNP: No components found with this basename: POCBNP,  CBG:  Recent Labs Lab 01/25/14 0003 01/25/14 0558 01/25/14 1156  GLUCAP 128* 134* 110*    No results found for this or any previous visit (from the past 240 hour(s)).   Studies: Ct Abdomen Pelvis W Contrast 01/24/2014    IMPRESSION: Increased distention and edema of the gallbladder wall since the prior study. No biliary  obstruction.  Otherwise, normal exam.   Electronically Signed   By: Rozetta Nunnery M.D.   On: 01/24/2014 19:10    Scheduled Meds: . antiseptic oral rinse  15 mL Mouth Rinse BID  . mometasone-formoterol  2 puff Inhalation BID  . pantoprazole (PROTONIX) IV  40 mg Intravenous Q12H  . piperacillin-tazobactam (ZOSYN)  IV  3.375 g Intravenous 3 times per day  . tiotropium  18 mcg Inhalation Daily   Continuous Infusions: . sodium chloride 75 mL/hr at 01/25/14 1228

## 2014-01-25 NOTE — Progress Notes (Signed)
Gen. Surgery:  Full consult to follow  I then reviewed and examined this patient this morning. I saw him during his last hospitalization, at which time he was a posterior surgery and underwent percutaneous cholecystostomy.  He did well, but now he has recurrent severe acute cholecystitis. Remains quite tender in the right upper quadrant and has leukocytosis of 21,000. I do not think there is value in treating him with antibiotics alone, nor do I think we should repeat the percutaneous drainage.  The best  approach would be to proceed with cholecystectomy today. We can attempt this laparoscopically but it may have to be converted to open because of the thickening & intense inflammation. It is probable  that he will be on the ventilator postop. I discussed his options with him and he agrees.  We will wait until risk assessment by pulmonary medicine is performed this morning. Obvious high risk.  Please let us know once they had completed their evaluation.   Edsel Petrin. Dalbert Batman, M.D., [facility] General and Minimally invasive Surgery Breast and Colorectal Surgery Office:   [phone] Pager:   [phone]

## 2014-01-25 NOTE — Consult Note (Signed)
Gen. Surgery:  I have discussed the patient's clinical situation with him and his family, Dr. Elsworth Soho. I reviewed his prior history and his current problem.  He has been given the option of a percutaneous cholecystostomy or cholecystectomy. He is aware that percutaneous cholecystostomy is safer in the short term, has approximately 80-90% chance of resolving his infection with about a 10% chance of failure and having to go on to cholecystectomy this admission. He knows that there is a risk of recurrent disease in the future. He knows that general anesthesia and cholecystectomy carry  risks of respiratory failure, tracheostomy, pneumonia and death as well as surgical complications of bleeding and bile leak because of the acute and chronic inflammation. We've talked this over at length with him and his family. He has decided he would like to go ahead with percutaneous cholecystostomy and then discontinue smoking so that he will be in better shape in the future if he has to have the cholecystectomy done. I think this is a reasonable option.  We'll schedule for urgent percutaneous cholecystostomy in interventional radiology. He  He may start a liquid diet thereafter.   Allen Green. Allen Green, M.D., [facility] General and Minimally invasive Surgery Breast and Colorectal Surgery Office:   [phone] Pager:   [phone]

## 2014-01-25 NOTE — Consult Note (Signed)
Reason for Consult:Cholecystitis, request cholecystostomy drain Consulting Radiologist: Laurence Ferrari Referring Physician: Dalbert Batman   HPI: Allen Green is an 63 y.o. male with cholelithiasis who previously had cholecystitis in 4/15. He was treated with perc chole drain due to COPD/Resp status. He did well and follow up cholangiogram showed his cystic duct to be patent with contrast flowing freely to the duodenum. His drain was removed by surgical team. He had been doing well until yesterday when he developed recurrent abd pain, elevated WBC. He has been admitted after CT scan finds evidence of cholecystitis again. Due to his respiratory status, he is not a great candidate for operative intervention at present time. IR is requested to place new chole drain for treatment, with intention for definitive cholecystectomy once he is more medically fit. PMHx, meds, labs, imaging reviewed.  Past Medical History:  Past Medical History  Diagnosis Date  . COPD (chronic obstructive pulmonary disease)     Probable - long-standing tobacco abuse  . Chronic pain     Bilateral knees and shoulders  . Pneumonia     Managed as outpatient  . GERD (gastroesophageal reflux disease)   . 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 [name]    Surgical History:  Past Surgical History  Procedure Laterality Date  . Right total knee arthroplasty  2003  . Left total knee arthroplasty  1998    "left knee replacement is broken" - 08/09/13    Family History:  Family History  Problem Relation Age of Onset  . Diabetes type II Father   . Coronary artery disease Father     MI in his 40s  . Diabetes Mother   . Diabetes Brother   . Diabetes Sister   . Asthma Sister     Social History:  reports that he has been smoking Cigarettes.  He started smoking about 51 years ago. He has a 100 pack-year smoking history. He has never used smokeless tobacco. He reports that he does not drink alcohol or use illicit drugs.  Allergies: No  Known Allergies  Medications:  Continuous: . sodium chloride 75 mL/hr at 01/25/14 1228    ROS: See HPI for pertinent findings, otherwise complete 10 system review negative.  Physical Exam: Blood pressure 114/75, pulse 88, temperature 99.1 F (37.3 C), temperature source Oral, resp. rate 18, height _0  (1.803 m), weight 255 lb (115.667 kg), SpO2 99.00%. General: Obese WM, NAD, nontoxic appearing ENT: airway unremarkable Lungs: CTA without w/r/r Heart: Regular Abdomen: soft, ND, mildly tender RUQ   Labs: CBC  Recent Labs  01/24/14 1603 01/25/14 0516  WBC 21.4* 24.2*  HGB 17.1* 16.0  HCT 49.1 47.7  PLT 361 319   MET  Recent Labs  01/24/14 1603 01/25/14 0516  NA 140 139  K 4.5 4.5  CL 99 99  CO2 24 26  GLUCOSE 115* 128*  BUN 15 16  CREATININE 0.89 0.96  CALCIUM 9.9 9.1    Recent Labs  01/24/14 1603 01/25/14 0516  PROT 8.2 7.3  ALBUMIN 4.2 3.5  AST 21 17  ALT 21 17  ALKPHOS 89 76  BILITOT 0.3 0.6  BILIDIR  --  <0.2  IBILI  --  NOT CALCULATED  LIPASE 83*  --    PT/INR No results found for this basename: LABPROT, INR,  in the last 72 hours ABG No results found for this basename: PHART, PCO2, PO2, HCO3,  in the last 72 hours    Ct Abdomen Pelvis W Contrast  01/24/2014   CLINICAL DATA:  Upper abdominal pain. Leukocytosis. Nausea and vomiting. Acute cholecystitis.  EXAM: CT ABDOMEN AND PELVIS WITH CONTRAST  TECHNIQUE: Multidetector CT imaging of the abdomen and pelvis was performed using the standard protocol following bolus administration of intravenous contrast.  CONTRAST:  178m OMNIPAQUE IOHEXOL 300 MG/ML  SOLN  COMPARISON:  CT scan dated 11/02/2013  FINDINGS: The gallbladder slightly distended and there is increased edema in the gallbladder wall since the prior study. There are no dilated bile ducts. Liver parenchyma, spleen, pancreas, adrenal glands, and kidneys appear normal. The bowel is normal including the terminal ileum and appendix. No free  air or free fluid. Fairly extensive calcification in the abdominal aorta with atheromatous irregularity. No acute osseous abnormalities.  IMPRESSION: Increased distention and edema of the gallbladder wall since the prior study. No biliary obstruction.  Otherwise, normal exam.   Electronically Signed   By: JRozetta NunneryM.D.   On: 01/24/2014 19:10    Assessment/Plan: Calculous cholecystitis For Perc cholecystostomy drain placement. Explained procedure, risks, complications, use of sedation. Labs reviewed, will check coags given likelihood of transhepatic drain placement Consent signed in chart  BAscencion DikePA-C 01/25/2014, 2:43 PM

## 2014-01-25 NOTE — Consult Note (Signed)
Name: Allen Green MRN: [id number] DOB: 1950-09-19    ADMISSION DATE:  01/24/2014 CONSULTATION DATE:  6/25  REFERRING MD :  Allen Green  PRIMARY SERVICE:  [facility]  CHIEF COMPLAINT:  Pulmonary pre-op   BRIEF PATIENT DESCRIPTION:  This is a 63 year old male f/b Dr Allen Green for chronic respiratory failure in the setting of COPD gold class D/stage III. Admitted on 6/24 w/ abd pain in setting of acute cholecystitis. PCCM asked to see as general surgery requested pulmonary clearance . He had significant hypoxia after drain placement in 11/2013 requiring ICU admission  SIGNIFICANT EVENTS / STUDIES:  CT abd 6/24: Increased distention and edema of the gallbladder wall since the prior study. No biliary obstruction.   PULMONARY FUNCTON TEST  01/07/2011   FVC  4.39   FEV1  1.61   FEV1/FVC  36.7   FVC  % Predicted  92   FEV % Predicted  48   FeF 25-75  .39   FeF 25-75 % Predicted  3.2    LINES / TUBES:   CULTURES:   ANTIBIOTICS: Zosyn 6/24>>>  HISTORY OF PRESENT ILLNESS:   63 y.o. male who was recently admitted for acute cholecystitis and was managed with a drain which was removed last month by Dr. Excell Green presents 6/24 to the ER because of abdominal pain nausea vomiting. Patient's symptoms in a.m. after patient had some fruits. Patient has some indigestion like feeling followed by nausea vomiting and epigastric pain. In the ER patient's labs revealed leukocytosis and CT scan showed distended gallbladder. On call surgeon Dr. Hassell Green was consulted and at this time patient admitted for further management. Patient denied any chest pain or shortness of breath has chronic cough denies any headache visual symptoms or any focal deficits or diarrhea. Patient has had cardiac clearance last month for gallbladder surgery. At this time surgery has requested pulmonary consult given patient's COPD.     PAST MEDICAL HISTORY :  Past Medical History  Diagnosis Date  . COPD (chronic obstructive pulmonary disease)       Probable - long-standing tobacco abuse  . Chronic pain     Bilateral knees and shoulders  . Pneumonia     Managed as outpatient  . GERD (gastroesophageal reflux disease)   . 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 Right total knee arthroplasty  2003  . Left total knee arthroplasty  1998    "left knee replacement is broken" - 08/09/13   Prior to Admission medications   Medication Sig Start Date End Date Taking? Authorizing Provider  albuterol (PROVENTIL HFA;VENTOLIN HFA) 108 (90 BASE) MCG/ACT inhaler Inhale into the lungs every 4 (four) hours as needed for wheezing or shortness of breath.   Yes Historical Provider, MD  clobetasol ointment (TEMOVATE) 6.83 % Apply 1 application topically 2 (two) times daily as needed (rash).   Yes Historical Provider, MD  mometasone-formoterol (DULERA) 200-5 MCG/ACT AERO Inhale 2 puffs into the lungs 2 (two) times daily. 10/25/13  Yes Allen Gobble, MD  tiotropium (SPIRIVA HANDIHALER) 18 MCG inhalation capsule Place 1 capsule (18 mcg total) into inhaler and inhale daily. 08/01/13 08/01/14 Yes Allen Gobble, MD   No Known Allergies  FAMILY HISTORY:  Family History  Problem Relation Age of Onset  . Diabetes type II Father   . Coronary artery disease Father     MI in his 16s  . Diabetes Mother   .  Diabetes Brother   . Diabetes Sister   . Asthma Sister    SOCIAL HISTORY:  reports that he has been smoking Cigarettes.  He started smoking about 51 years ago. He has a 100 pack-year smoking history. He has never used smokeless tobacco. He reports that he does not drink alcohol or use illicit drugs.  REVIEW OF SYSTEMS:   Constitutional: Negative for fever, chills, weight loss, malaise/fatigue and diaphoresis.  HENT: Negative for hearing loss, ear pain, nosebleeds, congestion, sore throat, neck pain, tinnitus and ear discharge.   Eyes: Negative for blurred vision, double vision, photophobia,  pain, discharge and redness.  Respiratory:  Cough, worse when he smokes, hemoptysis, sputum production, w/ smoking, shortness of breath, wheezing and stridor.  Still smoklng, requires SABA 4-5 times a day  Cardiovascular: Negative for chest pain, palpitations, orthopnea, claudication, leg swelling and PND.  Gastrointestinal: Negative for heartburn, nausea, vomiting, abdominal pain, diarrhea, constipation, blood in stool and melena.  Genitourinary: Negative for dysuria, urgency, frequency, hematuria and flank pain.  Musculoskeletal: Negative for myalgias, back pain, joint pain and falls.  Skin: Negative for itching and rash.  Neurological: Negative for dizziness, tingling, tremors, sensory change, speech change, focal weakness, seizures, loss of consciousness, weakness and headaches.  Endo/Heme/Allergies: Negative for environmental allergies and polydipsia. Does not bruise/bleed easily.  SUBJECTIVE:  No acute distress VITAL SIGNS: Temp:  [97.8 F (36.6 C)-98.9 F (37.2 C)] 98.9 F (37.2 C) (06/25 0600) Pulse Rate:  [91-100] 100 (06/25 0600) Resp:  [18] 18 (06/25 0600) BP: (116-135)/(73-89) 135/84 mmHg (06/25 0600) SpO2:  [91 %-95 %] 95 % (06/25 0600) Weight:  [115.667 kg (255 lb)] 115.667 kg (255 lb) (06/24 1554) 2 liters  PHYSICAL EXAMINATION: General:  Obese 63 year old male, lying in bed. No acute distress  Neuro:  Awake, oriented, no focal def  HEENT: [place], no JVD Cardiovascular:  rrr Lungs:  Decreased t/o  Abdomen:  Soft, tender to palp  Musculoskeletal:  Intact  Skin:  Intact    Recent Labs Lab 01/24/14 1603 01/25/14 0516  NA 140 139  K 4.5 4.5  CL 99 99  CO2 24 26  BUN 15 16  CREATININE 0.89 0.96  GLUCOSE 115* 128*    Recent Labs Lab 01/24/14 1603 01/25/14 0516  HGB 17.1* 16.0  HCT 49.1 47.7  WBC 21.4* 24.2*  PLT 361 319   Ct Abdomen Pelvis W Contrast  01/24/2014   CLINICAL DATA:  Upper abdominal pain. Leukocytosis. Nausea and vomiting. Acute  cholecystitis.  EXAM: CT ABDOMEN AND PELVIS WITH CONTRAST  TECHNIQUE: Multidetector CT imaging of the abdomen and pelvis was performed using the standard protocol following bolus administration of intravenous contrast.  CONTRAST:  16mL OMNIPAQUE IOHEXOL 300 MG/ML  SOLN  COMPARISON:  CT scan dated 11/02/2013  FINDINGS: The gallbladder slightly distended and there is increased edema in the gallbladder wall since the prior study. There are no dilated bile ducts. Liver parenchyma, spleen, pancreas, adrenal glands, and kidneys appear normal. The bowel is normal including the terminal ileum and appendix. No free air or free fluid. Fairly extensive calcification in the abdominal aorta with atheromatous irregularity. No acute osseous abnormalities.  IMPRESSION: Increased distention and edema of the gallbladder wall since the prior study. No biliary obstruction.  Otherwise, normal exam.   Electronically Signed   By: Rozetta Nunnery M.D.   On: 01/24/2014 19:10    ASSESSMENT / PLAN:  Chronic respiratory Failure GOLD class D COPD w/ severe pulmonary limitations  Active tobacco abuse  Acute cholecystitis (recurrent)  Discussion Mr Corning is at very high risk from pulmonary stand-point for multiple post-op and intra-op complications including but not limited to: prolonged ventilator dependence, inability to wean, requiring tracheostomy, prolonged debilitation, pneumonia and even death. His options are percutaneous drain or surgery.  His lung function is probably worse than reflected by FEV1 48% in 2012 -due to obesity & ongoing smoking. He had significant hypoxia from splinting after drain placement in 11/2013 requiring ICU admission In an ideal setting perc drain would be the safer pulmonary option, however if surgery thinks that cholecystectomy indicated then his pulmonary status is not a absolute contraindication for surgical intervention providing he accepts the above mentioned risk.   recs  -Ok from our  stand-point to proceed w/ surgery if the pt accepts the above mentioned risk, although perc drain  would carry much less pulmonary risk  -He must stop smoking  -Continue his home dulera and spiriva, If acutely ill, can change to pulmicort/brovana nebs  -If he has surgery he must recover in the intensive care  -We will cont to follow while in the hospital setting.   Kara Mead MD. [facility] Flood. [facility] Pager [phone] If no response call [phone]    01/25/2014, 9:12 AM

## 2014-01-25 NOTE — ED Provider Notes (Signed)
Medical screening examination/treatment/procedure(s) were performed by non-physician practitioner and as supervising physician I was immediately available for consultation/collaboration.   EKG Interpretation   Date/Time:  Wednesday January 24 2014 15:53:32 EDT Ventricular Rate:  94 PR Interval:  159 QRS Duration: 91 QT Interval:  374 QTC Calculation: 468 R Axis:   63 Text Interpretation:  Sinus rhythm Borderline low voltage, extremity leads  Abnormal R-wave progression, early transition No significant change since  last tracing Confirmed by Maryan Rued  MD, Loree Fee ([id number]) on 01/24/2014  4:12:00 PM        Blanchie Dessert, MD 01/25/14 2352

## 2014-01-26 DIAGNOSIS — K8020 Calculus of gallbladder without cholecystitis without obstruction: Secondary | ICD-10-CM

## 2014-01-26 DIAGNOSIS — R112 Nausea with vomiting, unspecified: Secondary | ICD-10-CM

## 2014-01-26 LAB — GLUCOSE, CAPILLARY
GLUCOSE-CAPILLARY: 127 mg/dL — AB (ref 70–99)
Glucose-Capillary: 107 mg/dL — ABNORMAL HIGH (ref 70–99)
Glucose-Capillary: 109 mg/dL — ABNORMAL HIGH (ref 70–99)
Glucose-Capillary: 93 mg/dL (ref 70–99)

## 2014-01-26 NOTE — Progress Notes (Signed)
Subjective: Patient states he is doing much better today from yesterday. He states his RUQ pain yesterday was 10/10 and today he rates his RUQ pain 4/10. He denies any fever, chills, nausea or vomiting. He is being started on a regular diet today.  Objective: Physical Exam: BP 117/67  Pulse 84  Temp(Src) 98.5 F (36.9 C) (Oral)  Resp 18  Ht 5\' 11"  (1.803 m)  Wt 255 lb (115.667 kg)  BMI 35.58 kg/m2  SpO2 92%  General: A&Ox3, NAD, lying in bed Abd: Soft, NT, ND, (+) BS, RUQ drain dressing C/D/I, no signs of leaking, bleeding or hematoma. 24 hr output 860 cc bilious output, no Cx sent.  Labs: CBC  Recent Labs  01/24/14 1603 01/25/14 0516  WBC 21.4* 24.2*  HGB 17.1* 16.0  HCT 49.1 47.7  PLT 361 319   BMET  Recent Labs  01/24/14 1603 01/25/14 0516  NA 140 139  K 4.5 4.5  CL 99 99  CO2 24 26  GLUCOSE 115* 128*  BUN 15 16  CREATININE 0.89 0.96  CALCIUM 9.9 9.1   LFT  Recent Labs  01/24/14 1603 01/25/14 0516  PROT 8.2 7.3  ALBUMIN 4.2 3.5  AST 21 17  ALT 21 17  ALKPHOS 89 76  BILITOT 0.3 0.6  BILIDIR  --  <0.2  IBILI  --  NOT CALCULATED  LIPASE 83*  --    PT/INR  Recent Labs  01/25/14 1453  LABPROT 14.9  INR 1.17     Studies/Results: Ct Abdomen Pelvis W Contrast  01/24/2014   CLINICAL DATA:  Upper abdominal pain. Leukocytosis. Nausea and vomiting. Acute cholecystitis.  EXAM: CT ABDOMEN AND PELVIS WITH CONTRAST  TECHNIQUE: Multidetector CT imaging of the abdomen and pelvis was performed using the standard protocol following bolus administration of intravenous contrast.  CONTRAST:  124mL OMNIPAQUE IOHEXOL 300 MG/ML  SOLN  COMPARISON:  CT scan dated 11/02/2013  FINDINGS: The gallbladder slightly distended and there is increased edema in the gallbladder wall since the prior study. There are no dilated bile ducts. Liver parenchyma, spleen, pancreas, adrenal glands, and kidneys appear normal. The bowel is normal including the terminal ileum and appendix. No  free air or free fluid. Fairly extensive calcification in the abdominal aorta with atheromatous irregularity. No acute osseous abnormalities.  IMPRESSION: Increased distention and edema of the gallbladder wall since the prior study. No biliary obstruction.  Otherwise, normal exam.   Electronically Signed   By: Rozetta Nunnery M.D.   On: 01/24/2014 19:10   Ir Perc Cholecystostomy  01/26/2014   INDICATION: Recurrent acute cholecystitis - history of ultrasound fluoroscopic guided cholecystostomy tube placement on 11/03/2013 with subsequent cholangiogram demonstrating wide patency of both the cystic and common bile ducts. As such, the cholecystostomy tube was removed however patient had recurrence of the right upper abdominal quadrant pain with CT findings compatible with recurrent cholecystitis.  EXAM: ULTRASOUND AND FLUOROSCOPIC-GUIDED CHOLECYSTOSTOMY TUBE PLACEMENT  COMPARISON:  CT the abdomen pelvis- 11/02/2013; 01/24/2014; ultrasound fluoroscopic guided cholecystostomy tube placement -11/03/2013; percutaneous cholangiogram - 12/05/2013  MEDICATIONS: Fentanyl 50 mcg IV; Versed 1 mg IV; The patient is currently admitted to the hospital and on intravenous antibiotics. Antibiotics were administered within an appropriate time frame prior to skin puncture.  ANESTHESIA/SEDATION: Total Moderate Sedation Time  15 minutes  CONTRAST:  25 mL OMNIPAQUE IOHEXOL 300 MG/ML  SOLN  FLUOROSCOPY TIME:  54 second  COMPLICATIONS: None immediate.  PROCEDURE: Informed written consent was obtained from the patient after a discussion of  the risks, benefits and alternatives to treatment. Questions regarding the procedure were encouraged and answered. A timeout was performed prior to the initiation of the procedure.  The right upper abdominal quadrant was prepped and draped in the usual sterile fashion, and a sterile drape was applied covering the operative field. Maximum barrier sterile technique with sterile gowns and gloves were used for  the procedure. A timeout was performed prior to the initiation of the procedure. Local anesthesia was provided with 1% lidocaine with epinephrine.  Ultrasound scanning of the right upper quadrant demonstrates a markedly dilated gallbladder. Utilizing a transhepatic approach, a 22 gauge needle was advanced into the gallbladder under direct ultrasound guidance. An ultrasound image was saved for documentation purposes. Appropriate intraluminal puncture was confirmed with the efflux of bile and advancement of an 0.018 wire into the gallbladder lumen. The needle was exchanged for an Latta set. A small amount of contrast was injected to confirm appropriate intraluminal positioning. Over a Benson wire, a 53.2-French Cook cholecystomy tube was advanced into the gallbladder fossa, coiled and locked. Bile was aspirated and a small amount of contrast was injected as several post procedural spot radiographic images were obtained in various obliquities. The catheter was secured to the skin with suture, connected to a drainage bag and a dressing was placed. The patient tolerated the procedure well without immediate post procedural complication.  IMPRESSION: Successful ultrasound and fluoroscopic guided placement of a 10.2 French cholecystostomy tube.   Electronically Signed   By: Sandi Mariscal M.D.   On: 01/26/2014 08:26    Assessment/Plan: Recurrent acute cholecystitis with cholelithiasis on IV zosyn, no Cx sent. S/p percutaneous cholecystostomy drain placement 6/25 Vitals stable, afebrile, good output.  Plans per [facility].    LOS: 2 days    Allen Jacob PA-C 01/26/2014 1:01 PM

## 2014-01-26 NOTE — Progress Notes (Signed)
Patient ID: Allen Green, male   DOB: 05/24/1951, 63 y.o.   MRN: [id number] [facility] PRO[facility]ESS NOTE  Allen Green DVV:[id number] DOB: 1951-03-23 DOA: 01/24/2014 PCP: Linton Rump, PA  Brief narrative: 63 y.o. male with past medical history of COPD, recent acute cholecystitis managed with a drain removed about a month ago PTA who presented to [facility] ED 01/24/2014 with worsening abdominal pain in right upper abdominal quadrant associated with nausea and vomiting. In ED, vitals are stable. Ct abdomen showed distended gallbladder. Pt was started on empiric zosyn for treatment of acute cholecystitis. Pt underwent PCT placement 01/25/2014 with no subsequent complications.  Assessment/Plan:   Principal Problem:  Acute cholecystitis  Status post PCT placement 01/25/2014 with no subsequent complications. PCT placement done by IR Surgery following Continue zosyn for now  Continue supportive care with analgesia and antiemetics PRN Diet as tolerated  Active Problems:  COPD (chronic obstructive pulmonary disease)  Stable at this time; pulmonary following  Continue oxygen support via [place] to keep O2 saturation above 90%  Continue dulera and spiriva Leukocytosis  Secondary to acute cholecystitis  Continue zosyn for now    DVT prophylaxis: SCD's bilaterally  GI prophylaxis: Protonix IV Q 12 hours   Code Status: full code  Family Communication: plan of care discussed with the patient  Disposition Plan: home when stable   Consultants:  Pulmonary for medical clearance  Surgery  Interventional radiology Procedures:  Percutaneous cholecystostomy planned for 01/25/2014  Antibiotics:  Zosyn 01/24/2014 -->   Leisa Lenz, MD  [facility] Pager [phone]  If 7PM-7AM, please contact night-coverage [email or web address] Password [facility] 01/26/2014, 12:19 PM   LOS: 2 days    HPI/Subjective: No acute overnight events.  Objective: Filed Vitals:   01/26/14 0150 01/26/14 0543 01/26/14 0755 01/26/14  1000  BP: 102/70 102/65  117/67  Pulse: 88 85  84  Temp: 99.5 F (37.5 C) 97.6 F (36.4 C)  98.5 F (36.9 C)  TempSrc: Oral Oral  Oral  Resp: 18 18  18   Height:      Weight:      SpO2: 90% 94% 94% 92%    Intake/Output Summary (Last 24 hours) at 01/26/14 1219 Last data filed at 01/26/14 1000  Gross per 24 hour  Intake   1980 ml  Output   1560 ml  Net    420 ml    Exam:   General:  Pt is alert, follows commands appropriately, not in acute distress  Cardiovascular: Regular rate and rhythm, S1/S2, no murmurs  Respiratory: Clear to auscultation bilaterally, no wheezing, no crackles  Abdomen: Soft, non tender, non distended, bowel sounds present; PCT in place  Extremities: No edema, pulses DP and PT palpable bilaterally  Neuro: Grossly nonfocal  Data Reviewed: Basic Metabolic Panel:  Recent Labs Lab 01/24/14 1603 01/25/14 0516  NA 140 139  K 4.5 4.5  CL 99 99  CO2 24 26  GLUCOSE 115* 128*  BUN 15 16  CREATININE 0.89 0.96  CALCIUM 9.9 9.1   Liver Function Tests:  Recent Labs Lab 01/24/14 1603 01/25/14 0516  AST 21 17  ALT 21 17  ALKPHOS 89 76  BILITOT 0.3 0.6  PROT 8.2 7.3  ALBUMIN 4.2 3.5    Recent Labs Lab 01/24/14 1603  LIPASE 83*   No results found for this basename: AMMONIA,  in the last 168 hours CBC:  Recent Labs Lab 01/24/14 1603 01/25/14 0516  WBC 21.4* 24.2*  NEUTROABS 17.4*  --   HGB 17.1*  16.0  HCT 49.1 47.7  MCV 90.9 92.8  PLT 361 319   Cardiac Enzymes: No results found for this basename: CKTOTAL, CKMB, CKMBINDEX, TROPONINI,  in the last 168 hours BNP: No components found with this basename: POCBNP,  CBG:  Recent Labs Lab 01/25/14 1156 01/25/14 1926 01/26/14 0039 01/26/14 0554 01/26/14 1148  GLUCAP 110* 117* 93 109* 107*    No results found for this or any previous visit (from the past 240 hour(s)).   Studies: Ct Abdomen Pelvis W Contrast 01/24/2014  IMPRESSION: Increased distention and edema of the  gallbladder wall since the prior study. No biliary obstruction.  Otherwise, normal exam.   Electronically Signed   By: Rozetta Nunnery M.D.   On: 01/24/2014 19:10   Ir Perc Cholecystostomy 01/26/2014    IMPRESSION: Successful ultrasound and fluoroscopic guided placement of a 10.2 French cholecystostomy tube.   Electronically Signed   By: Sandi Mariscal M.D.   On: 01/26/2014 08:26    Scheduled Meds: . antiseptic oral rinse  15 mL Mouth Rinse BID  . mometasone-formoterol  2 puff Inhalation BID  . pantoprazole (PROTONIX) IV  40 mg Intravenous Q12H  . piperacillin-tazobactam (ZOSYN)  IV  3.375 g Intravenous 3 times per day  . tiotropium  18 mcg Inhalation Daily   Continuous Infusions:

## 2014-01-26 NOTE — Progress Notes (Signed)
1 Day Post-Op  Subjective: Underwent successful percutaneous cholecystostomy tube placement yesterday. He states he feels much better. Pain is down to about 4/10. Hungry.  Drainages bilious, not purulent  Afebrile. Heart rate 85.  Objective: Vital signs in last 24 hours: Temp:  [97.2 F (36.2 C)-99.5 F (37.5 C)] 97.6 F (36.4 C) (06/26 0543) Pulse Rate:  [85-94] 85 (06/26 0543) Resp:  [13-18] 18 (06/26 0543) BP: (102-136)/(55-80) 102/65 mmHg (06/26 0543) SpO2:  [90 %-99 %] 94 % (06/26 0543) Last BM Date: 01/25/14  Intake/Output from previous day: 06/25 0701 - 06/26 0700 In: 1580 [P.O.:1080; I.V.:300; IV Piggyback:200] Out: 1310 [Urine:450; Drains:860] Intake/Output this shift: Total I/O In: 1580 [P.O.:1080; I.V.:300; IV Piggyback:200] Out: 1310 [Urine:450; Drains:860]  General appearance: alert. Cooperative. Appears much more comfortable and relaxed. GI: abdomen is softer. Still a little tender in the right upper quadrant. No mass. Drainage is bilious.  Lab Results:   Recent Labs  01/24/14 1603 01/25/14 0516  WBC 21.4* 24.2*  HGB 17.1* 16.0  HCT 49.1 47.7  PLT 361 319   BMET  Recent Labs  01/24/14 1603 01/25/14 0516  NA 140 139  K 4.5 4.5  CL 99 99  CO2 24 26  GLUCOSE 115* 128*  BUN 15 16  CREATININE 0.89 0.96  CALCIUM 9.9 9.1   PT/INR  Recent Labs  01/25/14 1453  LABPROT 14.9  INR 1.17   ABG No results found for this basename: PHART, PCO2, PO2, HCO3,  in the last 72 hours  Studies/Results: Ct Abdomen Pelvis W Contrast  01/24/2014   CLINICAL DATA:  Upper abdominal pain. Leukocytosis. Nausea and vomiting. Acute cholecystitis.  EXAM: CT ABDOMEN AND PELVIS WITH CONTRAST  TECHNIQUE: Multidetector CT imaging of the abdomen and pelvis was performed using the standard protocol following bolus administration of intravenous contrast.  CONTRAST:  120mL OMNIPAQUE IOHEXOL 300 MG/ML  SOLN  COMPARISON:  CT scan dated 11/02/2013  FINDINGS: The gallbladder  slightly distended and there is increased edema in the gallbladder wall since the prior study. There are no dilated bile ducts. Liver parenchyma, spleen, pancreas, adrenal glands, and kidneys appear normal. The bowel is normal including the terminal ileum and appendix. No free air or free fluid. Fairly extensive calcification in the abdominal aorta with atheromatous irregularity. No acute osseous abnormalities.  IMPRESSION: Increased distention and edema of the gallbladder wall since the prior study. No biliary obstruction.  Otherwise, normal exam.   Electronically Signed   By: Rozetta Nunnery M.D.   On: 01/24/2014 19:10    Anti-infectives: Anti-infectives   Start     Dose/Rate Route Frequency Ordered Stop   01/25/14 0600  piperacillin-tazobactam (ZOSYN) IVPB 3.375 g     3.375 g 12.5 mL/hr over 240 Minutes Intravenous 3 times per day 01/24/14 2008     01/24/14 2030  piperacillin-tazobactam (ZOSYN) IVPB 3.375 g     3.375 g 100 mL/hr over 30 Minutes Intravenous NOW 01/24/14 2008 01/24/14 2202      Assessment/Plan:  Recurrent acute cholecystitis with cholelithiasis. Satisfactory response and improvement following percutaneous cholecystostomy a next yesterday Diet as tolerated Antibiotics to continue Check cultures  Severe oxygen-dependent COPD Active tobacco abuse Chronic respiratory failure.     LOS: 2 days    INGRAM,HAYWOOD M 01/26/2014

## 2014-01-26 NOTE — Progress Notes (Signed)
Flushed biliary drain w/ 5cc NS

## 2014-01-26 NOTE — Progress Notes (Signed)
Name: Allen Green MRN: [id number] DOB: 01-Jun-1951    ADMISSION DATE:  01/24/2014 CONSULTATION DATE:  6/25  REFERRING MD :  Charlies Silvers  PRIMARY SERVICE:  [facility]  CHIEF COMPLAINT:  Pulmonary pre-op   BRIEF PATIENT DESCRIPTION:  This is a 63 year old male f/b Dr Lamonte Sakai for chronic respiratory failure in the setting of COPD gold class D/stage III. Admitted on 6/24 w/ abd pain in setting of acute cholecystitis. PCCM asked to see as general surgery requested pulmonary clearance . He had significant hypoxia after drain placement in 11/2013 requiring ICU admission  SIGNIFICANT EVENTS / STUDIES:  CT abd 6/24: Increased distention and edema of the gallbladder wall since the prior study. No biliary obstruction.  6/25 biliary drain by IR  PULMONARY FUNCTON TEST  01/07/2011   FVC  4.39   FEV1  1.61   FEV1/FVC  36.7   FVC  % Predicted  92   FEV % Predicted  48   FeF 25-75  .39   FeF 25-75 % Predicted  3.2    LINES / TUBES:   CULTURES:   ANTIBIOTICS: Zosyn 6/24>>>  SUBJECTIVE:  No acute distress oob to chair Procedure uneventful, denies pain   VITAL SIGNS: Temp:  [97.2 F (36.2 C)-99.5 F (37.5 C)] 98.5 F (36.9 C) (06/26 1000) Pulse Rate:  [84-94] 84 (06/26 1000) Resp:  [13-18] 18 (06/26 1000) BP: (102-136)/(55-80) 117/67 mmHg (06/26 1000) SpO2:  [90 %-99 %] 92 % (06/26 1000) 2 liters  PHYSICAL EXAMINATION: General:  Obese 63 year old male, lying in bed. No acute distress  Neuro:  Awake, oriented, no focal def  HEENT: [place], no JVD Cardiovascular:  rrr Lungs:  Decreased t/o  Abdomen:  Soft, tender to palp  Musculoskeletal:  Intact  Skin:  Intact    Recent Labs Lab 01/24/14 1603 01/25/14 0516  NA 140 139  K 4.5 4.5  CL 99 99  CO2 24 26  BUN 15 16  CREATININE 0.89 0.96  GLUCOSE 115* 128*    Recent Labs Lab 01/24/14 1603 01/25/14 0516  HGB 17.1* 16.0  HCT 49.1 47.7  WBC 21.4* 24.2*  PLT 361 319   Ct Abdomen Pelvis W Contrast  01/24/2014   CLINICAL DATA:   Upper abdominal pain. Leukocytosis. Nausea and vomiting. Acute cholecystitis.  EXAM: CT ABDOMEN AND PELVIS WITH CONTRAST  TECHNIQUE: Multidetector CT imaging of the abdomen and pelvis was performed using the standard protocol following bolus administration of intravenous contrast.  CONTRAST:  163mL OMNIPAQUE IOHEXOL 300 MG/ML  SOLN  COMPARISON:  CT scan dated 11/02/2013  FINDINGS: The gallbladder slightly distended and there is increased edema in the gallbladder wall since the prior study. There are no dilated bile ducts. Liver parenchyma, spleen, pancreas, adrenal glands, and kidneys appear normal. The bowel is normal including the terminal ileum and appendix. No free air or free fluid. Fairly extensive calcification in the abdominal aorta with atheromatous irregularity. No acute osseous abnormalities.  IMPRESSION: Increased distention and edema of the gallbladder wall since the prior study. No biliary obstruction.  Otherwise, normal exam.   Electronically Signed   By: Rozetta Nunnery M.D.   On: 01/24/2014 19:10   Ir Perc Cholecystostomy  01/26/2014   INDICATION: Recurrent acute cholecystitis - history of ultrasound fluoroscopic guided cholecystostomy tube placement on 11/03/2013 with subsequent cholangiogram demonstrating wide patency of both the cystic and common bile ducts. As such, the cholecystostomy tube was removed however patient had recurrence of the right upper abdominal quadrant pain  with CT findings compatible with recurrent cholecystitis.  EXAM: ULTRASOUND AND FLUOROSCOPIC-GUIDED CHOLECYSTOSTOMY TUBE PLACEMENT  COMPARISON:  CT the abdomen pelvis- 11/02/2013; 01/24/2014; ultrasound fluoroscopic guided cholecystostomy tube placement -11/03/2013; percutaneous cholangiogram - 12/05/2013  MEDICATIONS: Fentanyl 50 mcg IV; Versed 1 mg IV; The patient is currently admitted to the hospital and on intravenous antibiotics. Antibiotics were administered within an appropriate time frame prior to skin puncture.   ANESTHESIA/SEDATION: Total Moderate Sedation Time  15 minutes  CONTRAST:  25 mL OMNIPAQUE IOHEXOL 300 MG/ML  SOLN  FLUOROSCOPY TIME:  54 second  COMPLICATIONS: None immediate.  PROCEDURE: Informed written consent was obtained from the patient after a discussion of the risks, benefits and alternatives to treatment. Questions regarding the procedure were encouraged and answered. A timeout was performed prior to the initiation of the procedure.  The right upper abdominal quadrant was prepped and draped in the usual sterile fashion, and a sterile drape was applied covering the operative field. Maximum barrier sterile technique with sterile gowns and gloves were used for the procedure. A timeout was performed prior to the initiation of the procedure. Local anesthesia was provided with 1% lidocaine with epinephrine.  Ultrasound scanning of the right upper quadrant demonstrates a markedly dilated gallbladder. Utilizing a transhepatic approach, a 22 gauge needle was advanced into the gallbladder under direct ultrasound guidance. An ultrasound image was saved for documentation purposes. Appropriate intraluminal puncture was confirmed with the efflux of bile and advancement of an 0.018 wire into the gallbladder lumen. The needle was exchanged for an [place] set. A small amount of contrast was injected to confirm appropriate intraluminal positioning. Over a Benson wire, a 43.2-French Cook cholecystomy tube was advanced into the gallbladder fossa, coiled and locked. Bile was aspirated and a small amount of contrast was injected as several post procedural spot radiographic images were obtained in various obliquities. The catheter was secured to the skin with suture, connected to a drainage bag and a dressing was placed. The patient tolerated the procedure well without immediate post procedural complication.  IMPRESSION: Successful ultrasound and fluoroscopic guided placement of a 10.2 French cholecystostomy tube.    Electronically Signed   By: Sandi Mariscal M.D.   On: 01/26/2014 08:26    ASSESSMENT / PLAN:  Chronic respiratory Failure GOLD class D COPD w/ severe pulmonary limitations  Active tobacco abuse  Acute cholecystitis (recurrent)  Discussion Mr Freas is at high risk from pulmonary stand-point for multiple post-op complications His lung function is probably worse than reflected by FEV1 48% in 2012 -due to obesity & ongoing smoking. He had significant hypoxia from splinting after drain placement in 11/2013 requiring ICU admission -Tolerated perc drain well this time In the future ,if surgery thinks that cholecystectomy indicated then his pulmonary status is not a absolute contraindication for surgical intervention providing he accepts the risk.   recs    -He must stop smoking  -Continue his home dulera and spiriva  -PCCM to sign off , I d/w dr Dalbert Batman  Kara Mead MD. [facility]. [place] [facility] Pager [phone] If no response call [phone]    01/26/2014, 11:16 AM

## 2014-01-26 NOTE — Progress Notes (Signed)
Flushed biliary drain w/ 5cc NS w/o difficulty.

## 2014-01-27 DIAGNOSIS — F17200 Nicotine dependence, unspecified, uncomplicated: Secondary | ICD-10-CM

## 2014-01-27 DIAGNOSIS — K810 Acute cholecystitis: Secondary | ICD-10-CM

## 2014-01-27 LAB — COMPREHENSIVE METABOLIC PANEL
ALBUMIN: 3 g/dL — AB (ref 3.5–5.2)
ALK PHOS: 61 U/L (ref 39–117)
ALT: 13 U/L (ref 0–53)
AST: 12 U/L (ref 0–37)
BUN: 14 mg/dL (ref 6–23)
CALCIUM: 9 mg/dL (ref 8.4–10.5)
CO2: 27 mEq/L (ref 19–32)
Chloride: 100 mEq/L (ref 96–112)
Creatinine, Ser: 1.01 mg/dL (ref 0.50–1.35)
GFR calc non Af Amer: 78 mL/min — ABNORMAL LOW (ref >90)
Glucose, Bld: 113 mg/dL — ABNORMAL HIGH (ref 70–99)
Potassium: 4.1 mEq/L (ref 3.7–5.3)
Sodium: 137 mEq/L (ref 137–147)
Total Bilirubin: 0.3 mg/dL (ref 0.3–1.2)
Total Protein: 7.2 g/dL (ref 6.0–8.3)

## 2014-01-27 LAB — CBC
HCT: 41.3 % (ref 39.0–52.0)
HEMOGLOBIN: 13.9 g/dL (ref 13.0–17.0)
MCH: 31 pg (ref 26.0–34.0)
MCHC: 33.7 g/dL (ref 30.0–36.0)
MCV: 92 fL (ref 78.0–100.0)
Platelets: 279 10*3/uL (ref 150–400)
RBC: 4.49 MIL/uL (ref 4.22–5.81)
RDW: 13.8 % (ref 11.5–15.5)
WBC: 14.7 10*3/uL — ABNORMAL HIGH (ref 4.0–10.5)

## 2014-01-27 LAB — GLUCOSE, CAPILLARY
GLUCOSE-CAPILLARY: 124 mg/dL — AB (ref 70–99)
GLUCOSE-CAPILLARY: 127 mg/dL — AB (ref 70–99)
GLUCOSE-CAPILLARY: 215 mg/dL — AB (ref 70–99)
Glucose-Capillary: 104 mg/dL — ABNORMAL HIGH (ref 70–99)
Glucose-Capillary: 142 mg/dL — ABNORMAL HIGH (ref 70–99)

## 2014-01-27 MED ORDER — TRAMADOL HCL 50 MG PO TABS: 50.0000 mg | ORAL_TABLET | Freq: Four times a day (QID) | ORAL | Status: DC | PRN

## 2014-01-27 MED ORDER — ALBUTEROL SULFATE (2.5 MG/3ML) 0.083% IN NEBU
2.5000 mg | INHALATION_SOLUTION | Freq: Three times a day (TID) | RESPIRATORY_TRACT | Status: DC
  Administered 2014-01-27 – 2014-01-28 (×4): 2.5 mg via RESPIRATORY_TRACT
  Filled 2014-01-27 (×4): qty 3

## 2014-01-27 NOTE — Progress Notes (Signed)
Biliary drain flushed with 10 cc NS. Allen Green, [employer]

## 2014-01-27 NOTE — Progress Notes (Signed)
Patient ID: SAMIT SYLVE, male   DOB: June 03, 1951, 63 y.o.   MRN: [id number] [facility] PRO[facility]ESS NOTE  ISAAC DUBIE AYT:[id number] DOB: 07/21/1951 DOA: 01/24/2014 PCP: Linton Rump, PA  Brief narrative: 63 y.o. male with past medical history of COPD, recent acute cholecystitis managed with a drain removed about a month ago PTA who presented to [facility] ED 01/24/2014 with worsening abdominal pain in right upper abdominal quadrant associated with nausea and vomiting. In ED, vitals are stable. Ct abdomen showed distended gallbladder. Pt was started on empiric zosyn for treatment of acute cholecystitis. Pt underwent PCT placement 01/25/2014 with no subsequent complications.   Assessment/Plan:   Principal Problem:  Acute cholecystitis  Status post PCT placement 01/25/2014 with no subsequent complications. PCT placement done by IR  Surgery following and recommending zosyn until WBC count normalizes; then can switch to PO abx Diet as tolerated  Active Problems:  COPD (chronic obstructive pulmonary disease)  Stable at this time; has been seen and evaluated by pulmonary  Continue oxygen support via [place] to keep O2 saturation above 90%  Continue dulera and spiriva Leukocytosis  Secondary to acute cholecystitis  Continue zosyn for now    DVT prophylaxis: SCD's bilaterally  GI prophylaxis: Protonix IV Q 12 hours   Code Status: full code  Family Communication: plan of care discussed with the patient  Disposition Plan: home when stable   Consultants:  Pulmonary for medical clearance  Surgery  Interventional radiology Procedures:  Percutaneous cholecystostomy planned for 01/25/2014  Antibiotics:  Zosyn 01/24/2014 -->   Leisa Lenz, MD  [facility] Pager [phone]  If 7PM-7AM, please contact night-coverage [email or web address] Password [facility] 01/27/2014, 12:59 PM   LOS: 3 days    HPI/Subjective: No acute overnight events.  Objective: Filed Vitals:   01/26/14 1954 01/26/14 2240 01/27/14  0557 01/27/14 1022  BP:  133/72 139/83   Pulse:  83 80   Temp:  97.7 F (36.5 C) 98.2 F (36.8 C)   TempSrc:  Oral Oral   Resp:  18 18   Height:      Weight:      SpO2: 94% 97% 97% 99%    Intake/Output Summary (Last 24 hours) at 01/27/14 1259 Last data filed at 01/27/14 0930  Gross per 24 hour  Intake  11496 ml  Output   2065 ml  Net   9431 ml    Exam:   General:  Pt is alert, follows commands appropriately, not in acute distress  Cardiovascular: Regular rate and rhythm, S1/S2, no murmurs  Respiratory: Clear to auscultation bilaterally, no wheezing, no crackles, no rhonchi  Abdomen: Soft, non tender, non distended, bowel sounds present  Extremities: Pulses DP and PT palpable bilaterally  Neuro: Grossly nonfocal  Data Reviewed: Basic Metabolic Panel:  Recent Labs Lab 01/24/14 1603 01/25/14 0516 01/27/14 0519  NA 140 139 137  K 4.5 4.5 4.1  CL 99 99 100  CO2 24 26 27   GLUCOSE 115* 128* 113*  BUN 15 16 14   CREATININE 0.89 0.96 1.01  CALCIUM 9.9 9.1 9.0   Liver Function Tests:  Recent Labs Lab 01/24/14 1603 01/25/14 0516 01/27/14 0519  AST 21 17 12   ALT 21 17 13   ALKPHOS 89 76 61  BILITOT 0.3 0.6 0.3  PROT 8.2 7.3 7.2  ALBUMIN 4.2 3.5 3.0*    Recent Labs Lab 01/24/14 1603  LIPASE 83*   No results found for this basename: AMMONIA,  in the last 168 hours CBC:  Recent Labs Lab  01/24/14 1603 01/25/14 0516 01/27/14 0519  WBC 21.4* 24.2* 14.7*  NEUTROABS 17.4*  --   --   HGB 17.1* 16.0 13.9  HCT 49.1 47.7 41.3  MCV 90.9 92.8 92.0  PLT 361 319 279   Cardiac Enzymes: No results found for this basename: CKTOTAL, CKMB, CKMBINDEX, TROPONINI,  in the last 168 hours BNP: No components found with this basename: POCBNP,  CBG:  Recent Labs Lab 01/26/14 1148 01/26/14 1751 01/27/14 0003 01/27/14 0544 01/27/14 1201  GLUCAP 107* 127* 124* 127* 104*    No results found for this or any previous visit (from the past 240 hour(s)).    Studies: Ir Perc Cholecystostomy  01/26/2014  IMPRESSION: Successful ultrasound and fluoroscopic guided placement of a 10.2 French cholecystostomy tube.      Scheduled Meds: . albuterol  2.5 mg Nebulization TID  . antiseptic oral rinse  15 mL Mouth Rinse BID  . mometasone-formoterol  2 puff Inhalation BID  . pantoprazole (PROTONIX) IV  40 mg Intravenous Q12H  . piperacillin-tazobactam (ZOSYN)  IV  3.375 g Intravenous 3 times per day  . tiotropium  18 mcg Inhalation Daily

## 2014-01-27 NOTE — Progress Notes (Signed)
2 Days Post-Op  Subjective: Not having any pain today.  Passing some gas.  Objective: Vital signs in last 24 hours: Temp:  [97.7 F (36.5 C)-99.3 F (37.4 C)] 98.2 F (36.8 C) (06/27 0557) Pulse Rate:  [80-83] 80 (06/27 0557) Resp:  [18] 18 (06/27 0557) BP: (125-139)/(69-83) 139/83 mmHg (06/27 0557) SpO2:  [94 %-97 %] 97 % (06/27 0557) Last BM Date: 01/26/14  Intake/Output from previous day: 06/26 0701 - 06/27 0700 In: 945 [P.O.:720; I.V.:120; IV Piggyback:100] Out: 2315 [Urine:2125; Drains:190] Intake/Output this shift: Total I/O In: 10951 [P.O.:360; BTDVV:61607] Out: -   PE: General- In NAD Abdomen-soft, non-tender, RUQ drain with bilious output  Lab Results:   Recent Labs  01/25/14 0516 01/27/14 0519  WBC 24.2* 14.7*  HGB 16.0 13.9  HCT 47.7 41.3  PLT 319 279   BMET  Recent Labs  01/25/14 0516 01/27/14 0519  NA 139 137  K 4.5 4.1  CL 99 100  CO2 26 27  GLUCOSE 128* 113*  BUN 16 14  CREATININE 0.96 1.01  CALCIUM 9.1 9.0   PT/INR  Recent Labs  01/25/14 1453  LABPROT 14.9  INR 1.17   Comprehensive Metabolic Panel:    Component Value Date/Time   NA 137 01/27/2014 0519   NA 139 01/25/2014 0516   K 4.1 01/27/2014 0519   K 4.5 01/25/2014 0516   CL 100 01/27/2014 0519   CL 99 01/25/2014 0516   CO2 27 01/27/2014 0519   CO2 26 01/25/2014 0516   BUN 14 01/27/2014 0519   BUN 16 01/25/2014 0516   CREATININE 1.01 01/27/2014 0519   CREATININE 0.96 01/25/2014 0516   GLUCOSE 113* 01/27/2014 0519   GLUCOSE 128* 01/25/2014 0516   CALCIUM 9.0 01/27/2014 0519   CALCIUM 9.1 01/25/2014 0516   AST 12 01/27/2014 0519   AST 17 01/25/2014 0516   ALT 13 01/27/2014 0519   ALT 17 01/25/2014 0516   ALKPHOS 61 01/27/2014 0519   ALKPHOS 76 01/25/2014 0516   BILITOT 0.3 01/27/2014 0519   BILITOT 0.6 01/25/2014 0516   PROT 7.2 01/27/2014 0519   PROT 7.3 01/25/2014 0516   ALBUMIN 3.0* 01/27/2014 0519   ALBUMIN 3.5 01/25/2014 0516     Studies/Results: Ir Perc  Cholecystostomy  01/26/2014   INDICATION: Recurrent acute cholecystitis - history of ultrasound fluoroscopic guided cholecystostomy tube placement on 11/03/2013 with subsequent cholangiogram demonstrating wide patency of both the cystic and common bile ducts. As such, the cholecystostomy tube was removed however patient had recurrence of the right upper abdominal quadrant pain with CT findings compatible with recurrent cholecystitis.  EXAM: ULTRASOUND AND FLUOROSCOPIC-GUIDED CHOLECYSTOSTOMY TUBE PLACEMENT  COMPARISON:  CT the abdomen pelvis- 11/02/2013; 01/24/2014; ultrasound fluoroscopic guided cholecystostomy tube placement -11/03/2013; percutaneous cholangiogram - 12/05/2013  MEDICATIONS: Fentanyl 50 mcg IV; Versed 1 mg IV; The patient is currently admitted to the hospital and on intravenous antibiotics. Antibiotics were administered within an appropriate time frame prior to skin puncture.  ANESTHESIA/SEDATION: Total Moderate Sedation Time  15 minutes  CONTRAST:  25 mL OMNIPAQUE IOHEXOL 300 MG/ML  SOLN  FLUOROSCOPY TIME:  54 second  COMPLICATIONS: None immediate.  PROCEDURE: Informed written consent was obtained from the patient after a discussion of the risks, benefits and alternatives to treatment. Questions regarding the procedure were encouraged and answered. A timeout was performed prior to the initiation of the procedure.  The right upper abdominal quadrant was prepped and draped in the usual sterile fashion, and a sterile drape was applied covering the  operative field. Maximum barrier sterile technique with sterile gowns and gloves were used for the procedure. A timeout was performed prior to the initiation of the procedure. Local anesthesia was provided with 1% lidocaine with epinephrine.  Ultrasound scanning of the right upper quadrant demonstrates a markedly dilated gallbladder. Utilizing a transhepatic approach, a 22 gauge needle was advanced into the gallbladder under direct ultrasound guidance. An  ultrasound image was saved for documentation purposes. Appropriate intraluminal puncture was confirmed with the efflux of bile and advancement of an 0.018 wire into the gallbladder lumen. The needle was exchanged for an [place] set. A small amount of contrast was injected to confirm appropriate intraluminal positioning. Over a Benson wire, a 51.2-French Cook cholecystomy tube was advanced into the gallbladder fossa, coiled and locked. Bile was aspirated and a small amount of contrast was injected as several post procedural spot radiographic images were obtained in various obliquities. The catheter was secured to the skin with suture, connected to a drainage bag and a dressing was placed. The patient tolerated the procedure well without immediate post procedural complication.  IMPRESSION: Successful ultrasound and fluoroscopic guided placement of a 10.2 French cholecystostomy tube.   Electronically Signed   By: Sandi Mariscal M.D.   On: 01/26/2014 08:26    Anti-infectives: Anti-infectives   Start     Dose/Rate Route Frequency Ordered Stop   01/25/14 0600  piperacillin-tazobactam (ZOSYN) IVPB 3.375 g     3.375 g 12.5 mL/hr over 240 Minutes Intravenous 3 times per day 01/24/14 2008     01/24/14 2030  piperacillin-tazobactam (ZOSYN) IVPB 3.375 g     3.375 g 100 mL/hr over 30 Minutes Intravenous NOW 01/24/14 2008 01/24/14 2202      Assessment Principal Problem:   Acute cholecystitis s/p percutaneous cholecystostomy-clinically improving; still with some leukocytosis Active Problems:   COPD (chronic obstructive pulmonary disease)-on Nebulizer treatments.   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   ROSENBOWER,TODD [name] 01/27/2014

## 2014-01-27 NOTE — Progress Notes (Signed)
Biliary drain flushed with 5cc NS

## 2014-01-27 NOTE — Progress Notes (Signed)
ANTIBIOTIC CONSULT NOTE - Follow Up  Pharmacy Consult for zosyn Indication: intra-abdominal  No Known Allergies  Patient Measurements: Height: 5\' 11"  (180.3 cm) Weight: 255 lb (115.667 kg) IBW/kg (Calculated) : 75.3 Adjusted Body Weight:   Vital Signs: Temp: 98.2 F (36.8 C) (06/27 0557) Temp src: Oral (06/27 0557) BP: 139/83 mmHg (06/27 0557) Pulse Rate: 80 (06/27 0557) Intake/Output from previous day: 06/26 0701 - 06/27 0700 In: 945 [P.O.:720; I.V.:120; IV Piggyback:100] Out: 2315 [Urine:2125; Drains:190] Intake/Output from this shift:    Labs:  Recent Labs  01/24/14 1603 01/25/14 0516 01/27/14 0519  WBC 21.4* 24.2* 14.7*  HGB 17.1* 16.0 13.9  PLT 361 319 279  CREATININE 0.89 0.96 1.01   Estimated Creatinine Clearance: 98.1 ml/min (by C-G formula based on Cr of 1.01). No results found for this basename: VANCOTROUGH, VANCOPEAK, VANCORANDOM, GENTTROUGH, GENTPEAK, GENTRANDOM, TOBRATROUGH, TOBRAPEAK, TOBRARND, AMIKACINPEAK, AMIKACINTROU, AMIKACIN,  in the last 72 hours   Microbiology: No results found for this or any previous visit (from the past 720 hour(s)).  Medical History: Past Medical History  Diagnosis Date  . COPD (chronic obstructive pulmonary disease)     Probable - long-standing tobacco abuse  . Chronic pain     Bilateral knees and shoulders  . Pneumonia     Managed as outpatient  . GERD (gastroesophageal reflux disease)   . 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 [name]    Assessment: 63 YOM admitted with abdominal pain, N/V.  CT of abdomen reveals increased distention and abdomen. Patient with acute cholecystitis with plan for cholecystectomy per surgery notes  6/24 >> zosyn  >>  Tmax: afeb WBCs: improved Renal: stable  Goal of Therapy:  Dose for renal function and indiation  Plan:  Continue Zosyn 3.375gm IV over 82min in ED then 3.375gm IV q8h over 4h infusion   Adrian Saran, PharmD, BCPS Pager [phone] 01/27/2014 8:12  AM

## 2014-01-27 NOTE — Progress Notes (Signed)
Subjective: Patient states he is doing much better. Eating regular diet without N/V or increased pain.  Objective: Physical Exam: BP 139/83  Pulse 80  Temp(Src) 98.2 F (36.8 C) (Oral)  Resp 18  Ht 5\' 11"  (1.803 m)  Wt 255 lb (115.667 kg)  BMI 35.58 kg/m2  SpO2 97%  General: A&Ox3, NAD, lying in bed Abd: Soft, NT, ND, (+) BS, RUQ drain dressing C/D/I, no signs of leaking, bleeding or hematoma. 24 hr output 190 cc thin yellow bilious output   Labs: CBC  Recent Labs  01/25/14 0516 01/27/14 0519  WBC 24.2* 14.7*  HGB 16.0 13.9  HCT 47.7 41.3  PLT 319 279   BMET  Recent Labs  01/25/14 0516 01/27/14 0519  NA 139 137  K 4.5 4.1  CL 99 100  CO2 26 27  GLUCOSE 128* 113*  BUN 16 14  CREATININE 0.96 1.01  CALCIUM 9.1 9.0   LFT  Recent Labs  01/24/14 1603 01/25/14 0516 01/27/14 0519  PROT 8.2 7.3 7.2  ALBUMIN 4.2 3.5 3.0*  AST 21 17 12   ALT 21 17 13   ALKPHOS 89 76 61  BILITOT 0.3 0.6 0.3  BILIDIR  --  <0.2  --   IBILI  --  NOT CALCULATED  --   LIPASE 83*  --   --    PT/INR  Recent Labs  01/25/14 1453  LABPROT 14.9  INR 1.17     Studies/Results: Ir Perc Cholecystostomy  01/26/2014   INDICATION: Recurrent acute cholecystitis - history of ultrasound fluoroscopic guided cholecystostomy tube placement on 11/03/2013 with subsequent cholangiogram demonstrating wide patency of both the cystic and common bile ducts. As such, the cholecystostomy tube was removed however patient had recurrence of the right upper abdominal quadrant pain with CT findings compatible with recurrent cholecystitis.  EXAM: ULTRASOUND AND FLUOROSCOPIC-GUIDED CHOLECYSTOSTOMY TUBE PLACEMENT  COMPARISON:  CT the abdomen pelvis- 11/02/2013; 01/24/2014; ultrasound fluoroscopic guided cholecystostomy tube placement -11/03/2013; percutaneous cholangiogram - 12/05/2013  MEDICATIONS: Fentanyl 50 mcg IV; Versed 1 mg IV; The patient is currently admitted to the hospital and on intravenous  antibiotics. Antibiotics were administered within an appropriate time frame prior to skin puncture.  ANESTHESIA/SEDATION: Total Moderate Sedation Time  15 minutes  CONTRAST:  25 mL OMNIPAQUE IOHEXOL 300 MG/ML  SOLN  FLUOROSCOPY TIME:  54 second  COMPLICATIONS: None immediate.  PROCEDURE: Informed written consent was obtained from the patient after a discussion of the risks, benefits and alternatives to treatment. Questions regarding the procedure were encouraged and answered. A timeout was performed prior to the initiation of the procedure.  The right upper abdominal quadrant was prepped and draped in the usual sterile fashion, and a sterile drape was applied covering the operative field. Maximum barrier sterile technique with sterile gowns and gloves were used for the procedure. A timeout was performed prior to the initiation of the procedure. Local anesthesia was provided with 1% lidocaine with epinephrine.  Ultrasound scanning of the right upper quadrant demonstrates a markedly dilated gallbladder. Utilizing a transhepatic approach, a 22 gauge needle was advanced into the gallbladder under direct ultrasound guidance. An ultrasound image was saved for documentation purposes. Appropriate intraluminal puncture was confirmed with the efflux of bile and advancement of an 0.018 wire into the gallbladder lumen. The needle was exchanged for an [place] set. A small amount of contrast was injected to confirm appropriate intraluminal positioning. Over a Benson wire, a 37.2-French Cook cholecystomy tube was advanced into the gallbladder fossa, coiled and locked. Bile was  aspirated and a small amount of contrast was injected as several post procedural spot radiographic images were obtained in various obliquities. The catheter was secured to the skin with suture, connected to a drainage bag and a dressing was placed. The patient tolerated the procedure well without immediate post procedural complication.  IMPRESSION:  Successful ultrasound and fluoroscopic guided placement of a 10.2 French cholecystostomy tube.   Electronically Signed   By: Sandi Mariscal M.D.   On: 01/26/2014 08:26    Assessment/Plan: Recurrent acute cholecystitis with cholelithiasis on IV zosyn S/p percutaneous cholecystostomy drain placement 6/25 WBC down Vitals stable, afebrile, good output.  Plans per [facility].    LOS: 3 days    Ascencion Dike PA-C 01/27/2014 9:15 AM

## 2014-01-27 NOTE — Progress Notes (Signed)
Relayed message from Dr Charlies Silvers to pt that he will not be discharged today due to WBC still elevated (14.7). Pt & wife not surprised. [place], [employer]

## 2014-01-28 DIAGNOSIS — J3089 Other allergic rhinitis: Secondary | ICD-10-CM

## 2014-01-28 LAB — CBC
HCT: 40.9 % (ref 39.0–52.0)
Hemoglobin: 14.1 g/dL (ref 13.0–17.0)
MCH: 31.2 pg (ref 26.0–34.0)
MCHC: 34.5 g/dL (ref 30.0–36.0)
MCV: 90.5 fL (ref 78.0–100.0)
Platelets: 331 10*3/uL (ref 150–400)
RBC: 4.52 MIL/uL (ref 4.22–5.81)
RDW: 13.6 % (ref 11.5–15.5)
WBC: 10.7 10*3/uL — ABNORMAL HIGH (ref 4.0–10.5)

## 2014-01-28 LAB — GLUCOSE, CAPILLARY: Glucose-Capillary: 105 mg/dL — ABNORMAL HIGH (ref 70–99)

## 2014-01-28 MED ORDER — AMOXICILLIN-POT CLAVULANATE 875-125 MG PO TABS: 1.0000 | ORAL_TABLET | Freq: Two times a day (BID) | ORAL | Status: DC

## 2014-01-28 MED ORDER — PANTOPRAZOLE SODIUM 40 MG PO TBEC: 40.0000 mg | DELAYED_RELEASE_TABLET | Freq: Every day | ORAL | Status: DC

## 2014-01-28 NOTE — Care Management Note (Signed)
    Page 1 of 1   01/28/2014     12:50:39 PM CARE MANAGEMENT NOTE 01/28/2014  Patient:  Allen Green, Allen Green   Account Number:  [id number]  Date Initiated:  01/25/2014  Documentation initiated by:  Sunday Spillers  Subjective/Objective Assessment:   63 yo male admitted with acute cholecytitis, failed OP treatment. PTA lived at home with spouse.     Action/Plan:   Home when stable   Anticipated DC Date:  01/28/2014   Anticipated DC Plan:  [place]  CM consult      Choice offered to / List presented to:             Status of service:  Completed, signed off Medicare Important Message given?  NA - LOS <3 / Initial given by admissions (If response is "NO", the following Medicare IM given date fields will be blank) Date Medicare IM given:   Date Additional Medicare IM given:    Discharge Disposition:  HOME/SELF CARE  Per UR Regulation:  Reviewed for med. necessity/level of care/duration of stay  If discussed at [place] of Stay Meetings, dates discussed:    Comments:  01/28/14 12:15 CM received call from RN stating pt has order for biliary drain mgmt by [facility].  CM spoke with spouse of pt who states they are refusing [facility] as their daughter is a [place] with [employer] and will take care of their needs.  CM asked if she was going to be able to get enough flushes (supplies); and according to family, this will not be a problem.  CM called RN back to notify her of the pt's plan. No other CM needs were communicated.  Mariane Masters, BSN, CM [phone].

## 2014-01-28 NOTE — Discharge Summary (Signed)
Physician Discharge Summary  Allen Green OEU:[id number] DOB: 1950-08-04 DOA: 01/24/2014  PCP: Linton Rump, PA  Admit date: 01/24/2014 Discharge date: 01/28/2014  Recommendations for Outpatient Follow-up:  1. Continue augmentin for 10 more days on discharge.   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 male with past medical history of COPD, recent acute cholecystitis managed with a drain removed about a month ago PTA who presented to [facility] ED 01/24/2014 with worsening abdominal pain in right upper abdominal quadrant associated with nausea and vomiting. In ED, vitals are stable. Ct abdomen showed distended gallbladder. Pt was started on empiric zosyn for treatment of acute cholecystitis. Pt underwent PCT placement 01/25/2014 with no subsequent complications.   Assessment/Plan:   Principal Problem:  Acute cholecystitis  Status post PCT placement 01/25/2014 with no subsequent complications. PCT placement done by IR  Surgery following and recommending zosyn until WBC count normalizes; WBC count almost WNL, 10.7; pt insists on going home today. Will switch to PO Augmentin. He knows to follow up for biliary drain removal  Pt to follow up with Dr. Dalbert Batman of surgery in 3 weeks [facility] order in place for help with drain care, flushing  Diet as tolerated  Active Problems:  COPD (chronic obstructive pulmonary disease)  Stable at this time; has been seen and evaluated by pulmonary  Continue dulera and spiriva Leukocytosis  Secondary to acute cholecystitis  Continue Augmentin PO on discharge    DVT prophylaxis: SCD's bilaterally  GI prophylaxis: Protonix IV Q 12 hours    Code Status: full code  Family Communication: plan of care discussed with the  patient    Consultants:  Pulmonary for medical clearance  Surgery  Interventional radiology Procedures:  Percutaneous cholecystostomy planned for 01/25/2014  Antibiotics:  Zosyn 01/24/2014 --> 01/28/2014 Augmentin on discharge.    SignedLeisa Lenz, MD  [facility] 01/28/2014, 11:45 AM  Pager #: [phone]   Discharge Exam: Filed Vitals:   01/28/14 0600  BP: 127/78  Pulse: 72  Temp: 97.9 F (36.6 C)  Resp: 18   Filed Vitals:   01/27/14 2126 01/27/14 2200 01/28/14 0600 01/28/14 0736  BP:  139/76 127/78   Pulse: 82 88 72   Temp:  98.4 F (36.9 C) 97.9 F (36.6 C)   TempSrc:  Oral Oral   Resp: 18 18 18    Height:      Weight:      SpO2: 95% 90% 98% 96%    General: Pt is alert, follows commands appropriately, not in acute distress Cardiovascular: Regular rate and rhythm, S1/S2 +, no murmurs Respiratory: Clear to auscultation bilaterally, no wheezing, no crackles, no rhonchi Abdominal: Soft, non tender, bowel sounds +, no guarding; PCT drain in place  Extremities: no edema, no cyanosis, pulses palpable bilaterally DP and PT Neuro: Grossly nonfocal  Discharge Instructions  Discharge Instructions   Call MD for:  difficulty breathing, headache or visual disturbances    Complete by:  As directed      Call MD for:  persistant dizziness or light-headedness    Complete by:  As directed      Call MD for:  persistant nausea and vomiting    Complete by:  As directed      Call MD for:  severe uncontrolled  pain    Complete by:  As directed      Diet - low sodium heart healthy    Complete by:  As directed      Discharge instructions    Complete by:  As directed   1. Continue Augmentin for 10 more days on discharge.     Increase activity slowly    Complete by:  As directed             Medication List         albuterol 108 (90 BASE) MCG/ACT inhaler  Commonly known as:  PROVENTIL HFA;VENTOLIN HFA  Inhale into the lungs every 4 (four) hours as needed  for wheezing or shortness of breath.     amoxicillin-clavulanate 875-125 MG per tablet  Commonly known as:  AUGMENTIN  Take 1 tablet by mouth 2 (two) times daily.     clobetasol ointment 0.05 %  Commonly known as:  TEMOVATE  Apply 1 application topically 2 (two) times daily as needed (rash).     mometasone-formoterol 200-5 MCG/ACT Aero  Commonly known as:  DULERA  Inhale 2 puffs into the lungs 2 (two) times daily.     pantoprazole 40 MG tablet  Commonly known as:  PROTONIX  Take 1 tablet (40 mg total) by mouth daily.     tiotropium 18 MCG inhalation capsule  Commonly known as:  SPIRIVA HANDIHALER  Place 1 capsule (18 mcg total) into inhaler and inhale daily.     traMADol 50 MG tablet  Commonly known as:  ULTRAM  Take 1 tablet (50 mg total) by mouth every 6 (six) hours as needed.           Follow-up Information   Follow up with Adin Hector, MD. Schedule an appointment as soon as possible for a visit in 3 weeks. (Follow up appt after recent hospitalization; please sch appt as soon as possible for 3 weeks follow up appt after this hosptialization )    Specialty:  General Surgery   Contact information:   [place] [address] [phone]       Follow up with BOUCHERLE, AMY, PA In 1 week. (Follow up appt after recent hospitalization)    Specialty:  Internal Medicine   Contact information:   [address] [place]  [address] [phone]        The results of significant diagnostics from this hospitalization (including imaging, microbiology, ancillary and laboratory) are listed below for reference.    Significant Diagnostic Studies: Ct Abdomen Pelvis W Contrast  01/24/2014   CLINICAL DATA:  Upper abdominal pain. Leukocytosis. Nausea and vomiting. Acute cholecystitis.  EXAM: CT ABDOMEN AND PELVIS WITH CONTRAST  TECHNIQUE: Multidetector CT imaging of the abdomen and pelvis was performed using the standard protocol following bolus  administration of intravenous contrast.  CONTRAST:  157mL OMNIPAQUE IOHEXOL 300 MG/ML  SOLN  COMPARISON:  CT scan dated 11/02/2013  FINDINGS: The gallbladder slightly distended and there is increased edema in the gallbladder wall since the prior study. There are no dilated bile ducts. Liver parenchyma, spleen, pancreas, adrenal glands, and kidneys appear normal. The bowel is normal including the terminal ileum and appendix. No free air or free fluid. Fairly extensive calcification in the abdominal aorta with atheromatous irregularity. No acute osseous abnormalities.  IMPRESSION: Increased distention and edema of the gallbladder wall since the prior study. No biliary obstruction.  Otherwise, normal exam.   Electronically Signed   By: Vanita Ingles.D.  On: 01/24/2014 19:10   Ir Perc Cholecystostomy  01/26/2014   INDICATION: Recurrent acute cholecystitis - history of ultrasound fluoroscopic guided cholecystostomy tube placement on 11/03/2013 with subsequent cholangiogram demonstrating wide patency of both the cystic and common bile ducts. As such, the cholecystostomy tube was removed however patient had recurrence of the right upper abdominal quadrant pain with CT findings compatible with recurrent cholecystitis.  EXAM: ULTRASOUND AND FLUOROSCOPIC-GUIDED CHOLECYSTOSTOMY TUBE PLACEMENT  COMPARISON:  CT the abdomen pelvis- 11/02/2013; 01/24/2014; ultrasound fluoroscopic guided cholecystostomy tube placement -11/03/2013; percutaneous cholangiogram - 12/05/2013  MEDICATIONS: Fentanyl 50 mcg IV; Versed 1 mg IV; The patient is currently admitted to the hospital and on intravenous antibiotics. Antibiotics were administered within an appropriate time frame prior to skin puncture.  ANESTHESIA/SEDATION: Total Moderate Sedation Time  15 minutes  CONTRAST:  25 mL OMNIPAQUE IOHEXOL 300 MG/ML  SOLN  FLUOROSCOPY TIME:  54 second  COMPLICATIONS: None immediate.  PROCEDURE: Informed written consent was obtained from the patient  after a discussion of the risks, benefits and alternatives to treatment. Questions regarding the procedure were encouraged and answered. A timeout was performed prior to the initiation of the procedure.  The right upper abdominal quadrant was prepped and draped in the usual sterile fashion, and a sterile drape was applied covering the operative field. Maximum barrier sterile technique with sterile gowns and gloves were used for the procedure. A timeout was performed prior to the initiation of the procedure. Local anesthesia was provided with 1% lidocaine with epinephrine.  Ultrasound scanning of the right upper quadrant demonstrates a markedly dilated gallbladder. Utilizing a transhepatic approach, a 22 gauge needle was advanced into the gallbladder under direct ultrasound guidance. An ultrasound image was saved for documentation purposes. Appropriate intraluminal puncture was confirmed with the efflux of bile and advancement of an 0.018 wire into the gallbladder lumen. The needle was exchanged for an Tangerine set. A small amount of contrast was injected to confirm appropriate intraluminal positioning. Over a Benson wire, a 59.2-French Cook cholecystomy tube was advanced into the gallbladder fossa, coiled and locked. Bile was aspirated and a small amount of contrast was injected as several post procedural spot radiographic images were obtained in various obliquities. The catheter was secured to the skin with suture, connected to a drainage bag and a dressing was placed. The patient tolerated the procedure well without immediate post procedural complication.  IMPRESSION: Successful ultrasound and fluoroscopic guided placement of a 10.2 French cholecystostomy tube.   Electronically Signed   By: Sandi Mariscal M.D.   On: 01/26/2014 08:26    Microbiology: No results found for this or any previous visit (from the past 240 hour(s)).   Labs: Basic Metabolic Panel:  Recent Labs Lab 01/24/14 1603 01/25/14 0516  01/27/14 0519  NA 140 139 137  K 4.5 4.5 4.1  CL 99 99 100  CO2 24 26 27   GLUCOSE 115* 128* 113*  BUN 15 16 14   CREATININE 0.89 0.96 1.01  CALCIUM 9.9 9.1 9.0   Liver Function Tests:  Recent Labs Lab 01/24/14 1603 01/25/14 0516 01/27/14 0519  AST 21 17 12   ALT 21 17 13   ALKPHOS 89 76 61  BILITOT 0.3 0.6 0.3  PROT 8.2 7.3 7.2  ALBUMIN 4.2 3.5 3.0*    Recent Labs Lab 01/24/14 1603  LIPASE 83*   No results found for this basename: AMMONIA,  in the last 168 hours CBC:  Recent Labs Lab 01/24/14 1603 01/25/14 0516 01/27/14 0519 01/28/14 0852  WBC 21.4* 24.2*  14.7* 10.7*  NEUTROABS 17.4*  --   --   --   HGB 17.1* 16.0 13.9 14.1  HCT 49.1 47.7 41.3 40.9  MCV 90.9 92.8 92.0 90.5  PLT 361 319 279 331   Cardiac Enzymes: No results found for this basename: CKTOTAL, CKMB, CKMBINDEX, TROPONINI,  in the last 168 hours BNP: BNP (last 3 results)  Recent Labs  08/09/13 1550  PROBNP 222.0*   CBG:  Recent Labs Lab 01/27/14 0544 01/27/14 1201 01/27/14 1901 01/28/14 0001 01/28/14 0624  GLUCAP 127* 104* 142* 215* 105*    Time coordinating discharge: Over 30 minutes

## 2014-01-28 NOTE — Progress Notes (Signed)
3 Days Post-Op  Subjective: Feeling better.  Bowels moving.  Objective: Vital signs in last 24 hours: Temp:  [97.9 F (36.6 C)-98.4 F (36.9 C)] 97.9 F (36.6 C) (06/28 0600) Pulse Rate:  [72-88] 72 (06/28 0600) Resp:  [18] 18 (06/28 0600) BP: (93-139)/(54-78) 127/78 mmHg (06/28 0600) SpO2:  [90 %-99 %] 96 % (06/28 0736) Last BM Date: 01/28/14  Intake/Output from previous day: 06/27 0701 - 06/28 0700 In: 995 [P.O.:840; IV Piggyback:150] Out: 3125 [Urine:2825; Drains:300] Intake/Output this shift:    PE: General- In NAD, sitting on commode having a BM Abdomen-thin, golden drain output  Lab Results:   Recent Labs  01/27/14 0519 01/28/14 0852  WBC 14.7* 10.7*  HGB 13.9 14.1  HCT 41.3 40.9  PLT 279 331   BMET  Recent Labs  01/27/14 0519  NA 137  K 4.1  CL 100  CO2 27  GLUCOSE 113*  BUN 14  CREATININE 1.01  CALCIUM 9.0   PT/INR  Recent Labs  01/25/14 1453  LABPROT 14.9  INR 1.17   Comprehensive Metabolic Panel:    Component Value Date/Time   NA 137 01/27/2014 0519   NA 139 01/25/2014 0516   K 4.1 01/27/2014 0519   K 4.5 01/25/2014 0516   CL 100 01/27/2014 0519   CL 99 01/25/2014 0516   CO2 27 01/27/2014 0519   CO2 26 01/25/2014 0516   BUN 14 01/27/2014 0519   BUN 16 01/25/2014 0516   CREATININE 1.01 01/27/2014 0519   CREATININE 0.96 01/25/2014 0516   GLUCOSE 113* 01/27/2014 0519   GLUCOSE 128* 01/25/2014 0516   CALCIUM 9.0 01/27/2014 0519   CALCIUM 9.1 01/25/2014 0516   AST 12 01/27/2014 0519   AST 17 01/25/2014 0516   ALT 13 01/27/2014 0519   ALT 17 01/25/2014 0516   ALKPHOS 61 01/27/2014 0519   ALKPHOS 76 01/25/2014 0516   BILITOT 0.3 01/27/2014 0519   BILITOT 0.6 01/25/2014 0516   PROT 7.2 01/27/2014 0519   PROT 7.3 01/25/2014 0516   ALBUMIN 3.0* 01/27/2014 0519   ALBUMIN 3.5 01/25/2014 0516     Studies/Results: No results found.  Anti-infectives: Anti-infectives   Start     Dose/Rate Route Frequency Ordered Stop   01/25/14 0600   piperacillin-tazobactam (ZOSYN) IVPB 3.375 g     3.375 g 12.5 mL/hr over 240 Minutes Intravenous 3 times per day 01/24/14 2008     01/24/14 2030  piperacillin-tazobactam (ZOSYN) IVPB 3.375 g     3.375 g 100 mL/hr over 30 Minutes Intravenous NOW 01/24/14 2008 01/24/14 2202      Assessment Principal Problem:   Acute cholecystitis s/p perc cholecystostomy-improving; WBC almost normal. 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  HHRN to assist with drain if needed.  Follow up with Dr. Dalbert Batman in 3 weeks.   Allen Green,Allen Green Allen Green 01/28/2014

## 2014-01-28 NOTE — Discharge Instructions (Signed)
Cholecystostomy  °The gallbladder is a pear-shaped organ that lies beneath the liver on the right side of the body. The gallbladder stores bile, a fluid that helps the body digest fats. However, sometimes bile and other fluids build up in the gallbladder because of an obstruction (for example, a gallstones). This can cause fever, pain, swelling, nausea and other serious symptoms. °The procedure used to drain these fluids is called a cholecystostomy. A tube is inserted into the gallbladder. Fluid drains through the tube into a plastic bag outside the body. This procedure is usually done on people who are admitted to the hospital. °The procedure is often recommended for people who cannot have gallbladder surgery right away, usually because they are too ill to make it through surgery. The cholecystostomy tube is usually temporary, until surgery can be done. °RISKS AND COMPLICATIONS °Although rare, complications can include: °· Clogging of the tube. °· Infection in or around the drain site. Antibiotics might be prescribed for the infection. Or, another tube might be inserted to drain the infected fluid. °· Internal bleeding from the liver. °BEFORE THE PROCEDURE  °· Try to quit smoking several weeks before the procedure. Smoking can slow healing. °· Arrange for someone to drive you home from the hospital. °· Right before your procedure, avoid all foods and liquids after midnight. This includes coffee, tea and water. °· On the day of the procedure, arrive early to fill out all the paperwork. °PROCEDURE °You will be given a sedative to make you sleepy and a local anesthetic to numb the skin. Next, a small cut is made in the abdomen. Then a tube is threaded through the cut into the gallbladder. The procedure is usually done with ultrasound to guide the tube into the gallbladder. Once the tube is in place, the drain is secured to the skin with a stitch. The tube is then connected to a drainage bag.  °AFTER THE PROCEDURE   °· People who have a cholecystostomy usually stay in the hospital for several days because they are so ill. You might not be able to eat for the first few days. Instead, you will be connected to an IV for fluids and nutrients. °· The procedure does not cure the blockage that caused the fluid to build up in the first place. Because of this, the gallbladder will need to be removed in the future. The drain is removed at that time. °HOME CARE INSTRUCTIONS  °Be sure to follow your healthcare provider's instructions carefully. You may shower but avoid tub baths and swimming until your caregiver says it is OK. Eat and drink according to the directions you have been given. And be sure to make all follow-up appointments.  °Call your healthcare provider if you notice new pain, redness or swelling around the wound. °SEEK IMMEDIATE MEDICAL CARE IF:  °· There is increased abdominal pain. °· Nausea or vomiting occurs. °· You develop a fever. °· The drainage tube comes out of the abdomen. °Document Released: 10/16/2008 Document Revised: 10/12/2011 Document Reviewed: 10/16/2008 °ExitCare® Patient Information ©2015 [employer]. This information is not intended to replace advice given to you by your health care provider. Make sure you discuss any questions you have with your health care provider. ° °

## 2014-02-11 ENCOUNTER — Other Ambulatory Visit: Payer: Self-pay | Admitting: Emergency Medicine

## 2014-02-19 ENCOUNTER — Other Ambulatory Visit (INDEPENDENT_AMBULATORY_CARE_PROVIDER_SITE_OTHER): Payer: Self-pay

## 2014-02-19 ENCOUNTER — Encounter (INDEPENDENT_AMBULATORY_CARE_PROVIDER_SITE_OTHER): Payer: Self-pay | Admitting: General Surgery

## 2014-02-19 ENCOUNTER — Ambulatory Visit (INDEPENDENT_AMBULATORY_CARE_PROVIDER_SITE_OTHER): Payer: Medicare Other | Admitting: General Surgery

## 2014-02-19 VITALS — BP 118/70 | HR 72 | Temp 98.4°F | Resp 14 | Ht 71.0 in | Wt 262.4 lb

## 2014-02-19 DIAGNOSIS — K810 Acute cholecystitis: Secondary | ICD-10-CM

## 2014-02-19 DIAGNOSIS — J441 Chronic obstructive pulmonary disease with (acute) exacerbation: Secondary | ICD-10-CM
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
## 2014-02-22 ENCOUNTER — Ambulatory Visit (INDEPENDENT_AMBULATORY_CARE_PROVIDER_SITE_OTHER): Payer: Medicare Other | Admitting: Internal Medicine

## 2014-02-22 ENCOUNTER — Encounter: Payer: Self-pay | Admitting: Internal Medicine

## 2014-02-22 VITALS — BP 114/76 | HR 76 | Temp 97.6°F | Ht 71.0 in | Wt 260.0 lb

## 2014-02-22 DIAGNOSIS — J441 Chronic obstructive pulmonary disease with (acute) exacerbation: Secondary | ICD-10-CM

## 2014-02-22 DIAGNOSIS — F17200 Nicotine dependence, unspecified, uncomplicated: Secondary | ICD-10-CM

## 2014-02-22 MED ORDER — PREDNISONE 10 MG PO TABS: ORAL_TABLET | ORAL | Status: DC

## 2014-02-22 NOTE — Assessment & Plan Note (Addendum)
DDX of  difficult airways management all start with A and  include Adherence, Ace Inhibitors, Acid Reflux, Active Sinus Disease, Alpha 1 Antitripsin deficiency, Anxiety masquerading as Airways dz,  ABPA,  allergy(esp in young), Aspiration (esp in elderly), Adverse effects of DPI,  Active smokers, plus two Bs  = Bronchiectasis and Beta blocker use..and one C= CHF  Adherence is always the initial "prime suspect" and is a multilayered concern that requires a "trust but verify" approach in every patient - starting with knowing how to use medications, especially inhalers, correctly, keeping up with refills and understanding the fundamental difference between maintenance and prns vs those medications only taken for a very short course and then stopped and not refilled.  The proper method of use, as well as anticipated side effects, of a metered-dose inhaler are discussed and demonstrated to the patient. Improved effectiveness after extensive coaching during this visit to a level of approximately  75% so keep working on improving hfa  Active smoking > discussed separately  ? Acid (or non-acid) GERD > always difficult to exclude as up to 75% of pts in some series report no assoc GI/ Heartburn symptoms> rec continue max (24h)  acid suppression and diet restrictions/ reviewed   ? Allergy > pt insists this is the case though suspect smoking is a bigger issue > Prednisone 10 mg take  4 each am x 2 days,   2 each am x 2 days,  1 each am x 2 days and stop     Each maintenance medication was reviewed in detail including most importantly the difference between maintenance and as needed and under what circumstances the prns are to be used.  Please see instructions for details which were reviewed in writing and the patient given a copy.

## 2014-02-22 NOTE — Assessment & Plan Note (Signed)
>   3 m  I took an extended  opportunity with this patient to outline the consequences of continued cigarette use  in airway disorders based on all the data we have from the multiple national lung health studies (perfomed over decades at millions of dollars in cost)  indicating that smoking cessation, not choice of inhalers or physicians, is the most important aspect of care.   

## 2014-02-22 NOTE — Patient Instructions (Addendum)
Work on inhaler technique:  relax and gently blow all the way out then take a nice smooth deep breath back in, triggering the inhaler at same time you start breathing in.  Hold for up to 5 seconds if you can.  Rinse and gargle with water when done  Use spiriva immediately after the dulera each am  Be sure you take Protonix Take 30-60 min before first meal of the day   Prednisone 10 mg take  4 each am x 2 days,   2 each am x 2 days,  1 each am x 2 days and stop  Call if no better by first of next week

## 2014-02-22 NOTE — Progress Notes (Signed)
Subjective:    Patient ID: Allen Green, male    DOB: [birth year], 63 y.o.   MRN: [id number] HPI 63 yo man with long tobacco hx, still smoking 2 pks a day. He has hx polycythemia, ? Etiology. Also OA with knee, shoulder and back pain. He is referred for severe exertional SOB. Has been progressive over several years. He has daily cough, some white phlegm. He hears wheezing  - often when exerting or when supine. He has had episodes of what sound like AE-COPD +/- pneumonia, never hospitalized but has had Prednisone and abx before. He has been on Spiriva before - he got it from a friend! Has also been rx with albuterol. He is interested in stopping smoking. He is being evaluated by Dr Birdie Hopes for possible re-do L knee surgery. He is planning to undergo cardiac stress testing under direction of Dr Scherrie Bateman 01/07/11 -- follows up for tobacco, COPD, hypoxemia. He tells me that he has cut down cigs significantly - has smoked 1 pk total in 4 days. He is scheduled to get cardiac testing on 01/14/11 with Dr Domenic Polite. Last time we started oxygen at 2L/min - he feels that it has helped his exertional tolerance. We started Spiriva x 10 days and he though it probably helped some, doesn't really miss it now that sample has run out. Doesn't have a SABA.   ROV 04/14/11 -- follows up for tobacco, COPD, hypoxemia. He tells me that he has been inconsistent with the Spiriva but that he does it most days. He can tell that it is helping him. He is wearing his oxygen to sleep, wears at home but doesn';t always wear it when he is out, but he knows he misses it. He still has significant exertional dyspnea. He is back up to a pack a day of cigarettes. He wants to quit, but doesn't have a plan.   ROV 06/22/11 -- 63 yom, follows up for tobacco, COPD, hypoxemia. Still smoking 1 pk/day. Pretty good about wearing his O2 set on 2L/min, but noticed some dry throat. He has had some URI sx last 2 weeks, more cough with white mucous. Last time we  added Symbicort to Spiriva - ? Whether the symbicort is causing throat irritation. He does feel that the breathing is better. He wants to quit smoking - we will make New Years Day our target.   ROV 07/24/11 -- COPD, CAD and hypoxemia, tobacco use.  He has been having more cough, more sputum - yellow/brown. No blood. Breathing is stable. Looking to have leg [facility] at [facility]. Stable on Symbicort and Spiriva. O2 on 2L/min - 3L/min.   ROV 06/23/12 -- COPD, CAD and hypoxemia, heavy tobacco use. He quit tobacco 1 month ago and is using e-cig. Presents for annual f/u, but reports more cough, congestion, some blood-tinged last 3 days, ? Fever, muscle aches. He is using spiriva and symbicort.    ROV 08/18/12 -- f/u for COPD, chronic bronchitis. He started the e-cig, allowed him to stop cigarettes x 5 weeks, then back to 2-3 a day; now no cigarettes x 1 week. He is on chronic O2. He is planning for L knee sgy, says that anesthesiologist can do it under spinal and conscious sedation.  No flares since last time. He is off prednisone. Still on Spiriva + Symbicort.    PULMONARY FUNCTON TEST 01/07/2011  FVC 4.39  FEV1 1.61  FEV1/FVC 36.7  FVC  % Predicted 92  FEV % Predicted 48  FeF 25-75 .39  FeF 25-75 % Predicted 3.2    12/05/12 -- Acute OV  Complains of increased SOB, wheezing, chest tightness, prod cough with clear mucus x4-5days - denies f/c/s.  CAT = 27.  No fever, discolored mucus, edema orthopnea, chest pain, or n/v.  Has restarted smoking, discussed smoking cessation.  NO OTC used for symptoms No increased edema.  No recent ER or hospital visit.   12/16/12 -- f/u for COPD, chronic bronchitis, smoker (1/2 pk a day). Currently on symbicort, spiriva, saba prn. He is having head congestion, clear mucous, cough productive clear. Using albuterol -3 x a day. He was unable to take the prior pred taper given by TP - he spilled coffee in the bottle  03/23/13 -- f/u for COPD, chronic bronchitis, smoker  (has cut down to 3 cig a day, using the vaporizer). For the last week has had more nasal congestion, more chest congestion, cough productive of white mucous. No fever, HA. Has been stuck in the bed all week.   ROV acute 03/29/13 -- f/u for COPD, chronic bronchitis, smoker. Treated with pred taper + azithro a week ago for AE in setting URI. He had a CXR at PCP >> COPD, no infiltrates. He has continued to have nasal congestion, cough. Turns out he wasn't smoking 3 cig a day, he admits now that he was smoking 3 packs a day. His Symbicort is no longer to be covered by his insurance. Will need a substitute. CXR reviewed from 8/20 > showed hyperinflation, chronic bronchitis changes, mild interstitial prominence.  >no changes    04/11/2013  Follow up  3 week follow up - reports breathing is doing well.  no new complaints.[name]  CAT Score is 20 .  Remains off cigarettes.  No flare in cough , wheezing,  Feels his dyspnea is better esp at night since stopping smoking .  Insurance will no longer cover Symbicort.  No fever, hemoptysis, chest pain or orthopnea.    [facility] follow up  Patient returns for a post hospital followup. Patient was admitted January 7 through 08/13/2013 for community-acquired pneumonia complicated by influenza and sepsis he was treated with aggressive IV antibiotics, nebulized bronchodilators, and Tamiflu and steroids. Patient was discharged on antibiotics, to complete a full course, along with a steroid taper. Since discharge has been slow to improve. Went to PCP on 1/24 for dyspnea and was rx'd Levaquin 750mg  x6days and pred taper - taken 30mg  today..  Says that breathing got worse 2 days ago. Cough is improved , minimally productive, mainly clear mucus. No fever. No hemopytis, or edema.  CXR today improved   ROV 09/25/13 -- Hx obesity, severe COPD, chronic bronchitis, smoker. He was admitted for CAP as above. He feels back to his baseline. He is limited by his leg pain and his  breathing. He is wearing 2-3 L/min. He has daily cough, sometimes gets food hung in his throat and makes him cough. Remains on Dulera and Spiriva. Uses fluticasone qd prn. He is smoking 2 pk a day.  rec We have agreed that you will decrease your cigarettes to 1 pack a day as soon as possible and stay there until next visit.  Please continue your spiriva, dulera and oxygen as you are taking them If your cough worsens then go back to using the fluticasone nasal spray every day.    02/22/2014 acute  ov/Allen Green re: aecopd/ still smoking  Chief Complaint  Patient presents with  . Acute Visit  Pt c/o increased SOB, nasal congestion and cough for the past several wks, worse x 2 days. Cough is prod with minimal white sputum. He has noticed some wheezing also and is using rescue inhaler approx 4 times per day.   cough is mostly daytime / no purulent sputum At baseline rarely uses saba, does get relief at rest, able to sleep ok since flare  No obvious patterns in  day to day or daytime variabilty or assoc  cp or chest tightness overt sinus or hb symptoms. No unusual exp hx or h/o childhood pna/ asthma or knowledge of premature birth.  Sleeping ok without nocturnal  or early am exacerbation  of respiratory  c/o's or need for noct saba. Also denies any obvious fluctuation of symptoms with weather or environmental changes or other aggravating or alleviating factors except as outlined above   Current Medications, Allergies, Complete Past Medical History, Past Surgical History, Family History, and Social History were reviewed in [employer] record.  ROS  The following are not active complaints unless bolded sore throat, dysphagia, dental problems, itching, sneezing,  nasal congestion or excess/ purulent secretions, ear ache,   fever, chills, sweats, unintended wt loss, pleuritic or exertional cp, hemoptysis,  orthopnea pnd or leg swelling, presyncope, palpitations, heartburn, abdominal  pain, anorexia, nausea, vomiting, diarrhea  or change in bowel or urinary habits, change in stools or urine, dysuria,hematuria,  rash, arthralgias, visual complaints, headache, numbness weakness or ataxia or problems with walking or coordination,  change in mood/affect or memory.            Objective:   Physical Exam  Wt Readings from Last 3 Encounters:  02/22/14 260 lb (117.935 kg)  02/19/14 262 lb 6.4 oz (119.024 kg)  01/24/14 255 lb (115.667 kg)      Gen: Pleasant, obese, in no distress,  normal affect/ w/c bound on 02 2lpm   ENT: No lesions,  mouth clear,  oropharynx clear, no postnasal drip,   Neck: No JVD, no TMG, no carotid bruits  Lungs: No use of accessory muscles, distant bilateral mid/late exp wheeze   Cardiovascular: RRR, heart sounds normal, no murmur or gallops, no peripheral edema  Musculoskeletal: No deformities, no cyanosis or clubbing  Neuro: alert, non focal  Skin: Warm, no lesions or rashes   CXR 08/28/13 >No acute infiltrate or pulmonary edema. Mild residual atelectasis or scarring right base

## 2014-03-05 ENCOUNTER — Ambulatory Visit (HOSPITAL_COMMUNITY)
Admission: RE | Admit: 2014-03-05 | Discharge: 2014-03-05 | Disposition: A | Discharge status: Home / Self Care | Payer: Medicare Other | Source: Ambulatory Visit | Attending: General Surgery | Admitting: General Surgery

## 2014-03-05 DIAGNOSIS — K810 Acute cholecystitis: Secondary | ICD-10-CM

## 2014-03-05 DIAGNOSIS — Z438 Encounter for attention to other artificial openings: Secondary | ICD-10-CM | POA: Insufficient documentation

## 2014-03-05 MED ORDER — IOHEXOL 300 MG/ML  SOLN
20.0000 mL | Freq: Once | INTRAMUSCULAR | Status: AC | PRN
  Administered 2014-03-05: 30 mL

## 2014-03-20 ENCOUNTER — Other Ambulatory Visit: Payer: Self-pay | Admitting: Emergency Medicine

## 2014-03-21 ENCOUNTER — Other Ambulatory Visit (HOSPITAL_COMMUNITY): Payer: Self-pay | Admitting: Interventional Radiology

## 2014-03-21 ENCOUNTER — Ambulatory Visit (HOSPITAL_COMMUNITY)
Admission: RE | Admit: 2014-03-21 | Discharge: 2014-03-21 | Disposition: A | Discharge status: Home / Self Care | Payer: Medicare Other | Source: Ambulatory Visit | Attending: Interventional Radiology | Admitting: Interventional Radiology

## 2014-03-21 ENCOUNTER — Ambulatory Visit (HOSPITAL_COMMUNITY): Payer: Medicare Other

## 2014-03-21 DIAGNOSIS — Z538 Procedure and treatment not carried out for other reasons: Secondary | ICD-10-CM | POA: Diagnosis not present

## 2014-03-21 DIAGNOSIS — K810 Acute cholecystitis: Secondary | ICD-10-CM

## 2014-03-21 DIAGNOSIS — Z4682 Encounter for fitting and adjustment of non-vascular catheter: Secondary | ICD-10-CM | POA: Diagnosis present

## 2014-03-21 MED ORDER — IOHEXOL 300 MG/ML  SOLN
10.0000 mL | Freq: Once | INTRAMUSCULAR | Status: AC | PRN
  Administered 2014-03-21: 10 mL

## 2014-03-22 ENCOUNTER — Encounter (HOSPITAL_COMMUNITY): Payer: Self-pay | Admitting: Pharmacy Technician

## 2014-03-26 ENCOUNTER — Other Ambulatory Visit: Payer: Self-pay | Admitting: Radiology

## 2014-03-27 ENCOUNTER — Inpatient Hospital Stay (HOSPITAL_COMMUNITY): Admission: RE | Admit: 2014-03-27 | Payer: Medicare Other | Source: Ambulatory Visit

## 2014-03-29 ENCOUNTER — Encounter (HOSPITAL_COMMUNITY): Payer: Self-pay | Admitting: Pharmacy Technician

## 2014-03-30 ENCOUNTER — Other Ambulatory Visit: Payer: Self-pay | Admitting: Emergency Medicine

## 2014-03-30 ENCOUNTER — Other Ambulatory Visit (HOSPITAL_COMMUNITY): Payer: Medicare Other

## 2014-04-03 ENCOUNTER — Other Ambulatory Visit: Payer: Self-pay | Admitting: Radiology

## 2014-04-04 ENCOUNTER — Ambulatory Visit (HOSPITAL_COMMUNITY)
Admission: RE | Admit: 2014-04-04 | Discharge: 2014-04-04 | Disposition: A | Discharge status: Home / Self Care | Payer: Medicare Other | Source: Ambulatory Visit | Attending: Interventional Radiology | Admitting: Interventional Radiology

## 2014-04-04 ENCOUNTER — Encounter (HOSPITAL_COMMUNITY): Payer: Self-pay

## 2014-04-04 DIAGNOSIS — M25569 Pain in unspecified knee: Secondary | ICD-10-CM | POA: Diagnosis not present

## 2014-04-04 DIAGNOSIS — K810 Acute cholecystitis: Secondary | ICD-10-CM | POA: Diagnosis not present

## 2014-04-04 DIAGNOSIS — D751 Secondary polycythemia: Secondary | ICD-10-CM | POA: Insufficient documentation

## 2014-04-04 DIAGNOSIS — K219 Gastro-esophageal reflux disease without esophagitis: Secondary | ICD-10-CM | POA: Insufficient documentation

## 2014-04-04 DIAGNOSIS — F17200 Nicotine dependence, unspecified, uncomplicated: Secondary | ICD-10-CM | POA: Diagnosis not present

## 2014-04-04 DIAGNOSIS — J449 Chronic obstructive pulmonary disease, unspecified: Secondary | ICD-10-CM | POA: Insufficient documentation

## 2014-04-04 DIAGNOSIS — Z438 Encounter for attention to other artificial openings: Secondary | ICD-10-CM | POA: Diagnosis present

## 2014-04-04 DIAGNOSIS — J4489 Other specified chronic obstructive pulmonary disease: Secondary | ICD-10-CM | POA: Insufficient documentation

## 2014-04-04 HISTORY — DX: Inflammatory liver disease, unspecified: K75.9

## 2014-04-04 LAB — CBC
HEMATOCRIT: 44.2 % (ref 39.0–52.0)
Hemoglobin: 15.4 g/dL (ref 13.0–17.0)
MCH: 32.3 pg (ref 26.0–34.0)
MCHC: 34.8 g/dL (ref 30.0–36.0)
MCV: 92.7 fL (ref 78.0–100.0)
PLATELETS: 365 10*3/uL (ref 150–400)
RBC: 4.77 MIL/uL (ref 4.22–5.81)
RDW: 15.3 % (ref 11.5–15.5)
WBC: 12.4 10*3/uL — ABNORMAL HIGH (ref 4.0–10.5)

## 2014-04-04 LAB — COMPREHENSIVE METABOLIC PANEL
ALBUMIN: 3.6 g/dL (ref 3.5–5.2)
ALT: 33 U/L (ref 0–53)
ANION GAP: 14 (ref 5–15)
AST: 17 U/L (ref 0–37)
Alkaline Phosphatase: 84 U/L (ref 39–117)
BILIRUBIN TOTAL: 0.4 mg/dL (ref 0.3–1.2)
BUN: 16 mg/dL (ref 6–23)
CALCIUM: 9.7 mg/dL (ref 8.4–10.5)
CO2: 26 mEq/L (ref 19–32)
Chloride: 98 mEq/L (ref 96–112)
Creatinine, Ser: 0.72 mg/dL (ref 0.50–1.35)
GFR calc Af Amer: >90 mL/min (ref >90)
GFR calc non Af Amer: >90 mL/min (ref >90)
Glucose, Bld: 106 mg/dL — ABNORMAL HIGH (ref 70–99)
Potassium: 4.6 mEq/L (ref 3.7–5.3)
Sodium: 138 mEq/L (ref 137–147)
TOTAL PROTEIN: 7.8 g/dL (ref 6.0–8.3)

## 2014-04-04 LAB — APTT: aPTT: 34 seconds (ref 24–37)

## 2014-04-04 LAB — PROTIME-INR
INR: 1.04 (ref 0.00–1.49)
Prothrombin Time: 13.6 seconds (ref 11.6–15.2)

## 2014-04-04 MED ORDER — KETOROLAC TROMETHAMINE 30 MG/ML IJ SOLN
INTRAMUSCULAR | Status: DC
Start: 2014-04-04 — End: 2014-04-05
  Filled 2014-04-04: qty 1

## 2014-04-04 MED ORDER — SODIUM CHLORIDE 0.9 % IV SOLN
Freq: Once | INTRAVENOUS | Status: AC
  Administered 2014-04-04: 11:00:00 via INTRAVENOUS

## 2014-04-04 MED ORDER — FENTANYL CITRATE 0.05 MG/ML IJ SOLN
INTRAMUSCULAR | Status: AC
  Filled 2014-04-04: qty 4

## 2014-04-04 MED ORDER — FENTANYL CITRATE 0.05 MG/ML IJ SOLN
INTRAMUSCULAR | Status: AC | PRN
  Administered 2014-04-04: 100 ug via INTRAVENOUS

## 2014-04-04 MED ORDER — CEFAZOLIN SODIUM-DEXTROSE 2-3 GM-% IV SOLR
2.0000 g | Freq: Once | INTRAVENOUS | Status: AC
  Administered 2014-04-04: 2 g via INTRAVENOUS

## 2014-04-04 MED ORDER — CEFAZOLIN SODIUM-DEXTROSE 2-3 GM-% IV SOLR
INTRAVENOUS | Status: AC
  Filled 2014-04-04: qty 50

## 2014-04-04 MED ORDER — IOHEXOL 300 MG/ML  SOLN
10.0000 mL | Freq: Once | INTRAMUSCULAR | Status: AC | PRN
  Administered 2014-04-04: 10 mL

## 2014-04-04 MED ORDER — HYDROMORPHONE HCL PF 2 MG/ML IJ SOLN
INTRAMUSCULAR | Status: AC
  Filled 2014-04-04: qty 1

## 2014-04-04 MED ORDER — MIDAZOLAM HCL 2 MG/2ML IJ SOLN
INTRAMUSCULAR | Status: AC | PRN
  Administered 2014-04-04: 1 mg via INTRAVENOUS

## 2014-04-04 MED ORDER — KETOROLAC TROMETHAMINE 30 MG/ML IJ SOLN
30.0000 mg | Freq: Once | INTRAMUSCULAR | Status: AC
  Administered 2014-04-04: 30 mg via INTRAVENOUS

## 2014-04-04 MED ORDER — HYDROMORPHONE HCL PF 1 MG/ML IJ SOLN
INTRAMUSCULAR | Status: AC | PRN
  Administered 2014-04-04: 2 mg via INTRAVENOUS

## 2014-04-04 MED ORDER — LIDOCAINE HCL (PF) 1 % IJ SOLN
INTRAMUSCULAR | Status: DC
Start: 2014-04-04 — End: 2014-04-05
  Filled 2014-04-04: qty 30

## 2014-04-04 MED ORDER — MIDAZOLAM HCL 2 MG/2ML IJ SOLN
INTRAMUSCULAR | Status: AC
  Filled 2014-04-04: qty 4

## 2014-04-04 NOTE — H&P (Signed)
Allen Green is an 63 y.o. male.   Chief Complaint: cholecystitis  HPI: Patient with history of severe COPD and recurrent cholecystitis with prior cholecystostomy cath placement (x2), most recently 01/26/2014. The catheter inadvertently fell out recently and request is now made for catheter replacement.  Past Medical History  Diagnosis Date  . COPD (chronic obstructive pulmonary disease)     Probable - long-standing tobacco abuse  . Chronic pain     Bilateral knees and shoulders  . GERD (gastroesophageal reflux disease)   . 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 [name]  . Pneumonia     January 2015  . Hepatitis     Hapatitis B 40 yrs ago    Past Surgical History  Procedure Laterality Date  . Right total knee arthroplasty  2003  . Left total knee arthroplasty  1998    "left knee replacement is broken" - 08/09/13    Family History  Problem Relation Age of Onset  . Diabetes type II Father   . Coronary artery disease Father     MI in his 58s  . Diabetes Mother   . Diabetes Brother   . Diabetes Sister   . Asthma Sister    Social History:  reports that he has been smoking Cigarettes.  He started smoking about 51 years ago. He has a 100 pack-year smoking history. He has never used smokeless tobacco. He reports that he does not drink alcohol or use illicit drugs.  Allergies: No Known Allergies  Current outpatient prescriptions:albuterol (PROVENTIL HFA;VENTOLIN HFA) 108 (90 BASE) MCG/ACT inhaler, Inhale 1 puff into the lungs every 6 (six) hours as needed for wheezing or shortness of breath., Disp: , Rfl: ;  fluticasone (FLONASE) 50 MCG/ACT nasal spray, Place 1 spray into both nostrils daily., Disp: , Rfl: ;  mometasone-formoterol (DULERA) 200-5 MCG/ACT AERO, Inhale 2 puffs into the lungs 2 (two) times daily., Disp: 3 Inhaler, Rfl: 3 pantoprazole (PROTONIX) 40 MG tablet, Take 40 mg by mouth daily as needed (Acid reflux)., Disp: , Rfl: ;  tiotropium (SPIRIVA HANDIHALER) 18 MCG inhalation  capsule, Place 1 capsule (18 mcg total) into inhaler and inhale daily., Disp: 30 capsule, Rfl: 6;  clobetasol ointment (TEMOVATE) 8.29 %, Apply 1 application topically 2 (two) times daily as needed (rash)., Disp: , Rfl:  Current facility-administered medications:ceFAZolin (ANCEF) IVPB 2 g/50 mL premix, 2 g, Intravenous, Once, Lavonia Drafts, PA-C   Results for orders placed during the hospital encounter of 04/04/14 (from the past 48 hour(s))  APTT     Status: None   Collection Time    04/04/14 10:28 AM      Result Value Ref Range   aPTT 34  24 - 37 seconds  CBC     Status: Abnormal   Collection Time    04/04/14 10:28 AM      Result Value Ref Range   WBC 12.4 (*) 4.0 - 10.5 K/uL   RBC 4.77  4.22 - 5.81 MIL/uL   Hemoglobin 15.4  13.0 - 17.0 g/dL   HCT 44.2  39.0 - 52.0 %   MCV 92.7  78.0 - 100.0 fL   MCH 32.3  26.0 - 34.0 pg   MCHC 34.8  30.0 - 36.0 g/dL   RDW 15.3  11.5 - 15.5 %   Platelets 365  150 - 400 K/uL  COMPREHENSIVE METABOLIC PANEL     Status: Abnormal   Collection Time    04/04/14 10:28 AM  Result Value Ref Range   Sodium 138  137 - 147 mEq/L   Potassium 4.6  3.7 - 5.3 mEq/L   Chloride 98  96 - 112 mEq/L   CO2 26  19 - 32 mEq/L   Glucose, Bld 106 (*) 70 - 99 mg/dL   BUN 16  6 - 23 mg/dL   Creatinine, Ser 0.72  0.50 - 1.35 mg/dL   Calcium 9.7  8.4 - 10.5 mg/dL   Total Protein 7.8  6.0 - 8.3 g/dL   Albumin 3.6  3.5 - 5.2 g/dL   AST 17  0 - 37 U/L   ALT 33  0 - 53 U/L   Alkaline Phosphatase 84  39 - 117 U/L   Total Bilirubin 0.4  0.3 - 1.2 mg/dL   GFR calc non Af Amer >90  >90 mL/min   GFR calc Af Amer >90  >90 mL/min   Comment: (NOTE)     The eGFR has been calculated using the CKD EPI equation.     This calculation has not been validated in all clinical situations.     eGFR's persistently <90 mL/min signify possible Chronic Kidney     Disease.   Anion gap 14  5 - 15  PROTIME-INR     Status: None   Collection Time    04/04/14 10:28 AM      Result Value  Ref Range   Prothrombin Time 13.6  11.6 - 15.2 seconds   INR 1.04  0.00 - 1.49   No results found.  Review of Systems  Constitutional: Negative for fever and chills.  Respiratory: Negative for hemoptysis.        Occ cough/dyspnea with exertion  Cardiovascular: Negative for chest pain.  Gastrointestinal: Negative for nausea, vomiting, abdominal pain and blood in stool.  Genitourinary: Negative for hematuria.  Musculoskeletal: Negative for back pain.  Neurological: Negative for headaches.  Endo/Heme/Allergies: Does not bruise/bleed easily.   Vitals: BP 95/55  HR 83  R 18  TEMP 98.3  O2 SATS 94% 2 liters N/C Physical Exam  Constitutional: He is oriented to person, place, and time. He appears well-developed and well-nourished.  Cardiovascular: Normal rate and regular rhythm.   Respiratory: Effort normal.  Distant BS bilat  GI: Soft. Bowel sounds are normal. There is no tenderness.  obese  Musculoskeletal: Normal range of motion. He exhibits edema.  Neurological: He is alert and oriented to person, place, and time.     Assessment/Plan Patient with history of severe COPD and recurrent cholecystitis with prior cholecystostomy cath placement (x2), most recently 01/26/2014. The catheter inadvertently fell out recently and request is now made for catheter replacement. Details/risks of procedure d/w pt/wife with their understanding and consent.   Myrlene Riera,D KEVIN 04/04/2014, 11:56 AM

## 2014-04-04 NOTE — Procedures (Signed)
Interventional Radiology Procedure Note  Procedure: Placement of percutaneous chole tube.  Complications: Post procedural pain Recommendations: - Bedrest x 2 hrs - Drain care at home - Q6-8 week chole tube check/changes  Signed,  Criselda Peaches, MD [facility]

## 2014-04-04 NOTE — Discharge Instructions (Signed)
Conscious Sedation Sedation is the use of medicines to promote relaxation and relieve discomfort and anxiety. Conscious sedation is a type of sedation. Under conscious sedation you are less alert than normal but are still able to respond to instructions or stimulation. Conscious sedation is used during short medical and dental procedures. It is milder than deep sedation or general anesthesia and allows you to return to your regular activities sooner.  LET [facility] CARE PROVIDER KNOW ABOUT:   Any allergies you have.  All medicines you are taking, including vitamins, herbs, eye drops, creams, and over-the-counter medicines.  Use of steroids (by mouth or creams).  Previous problems you or members of your family have had with the use of anesthetics.  Any blood disorders you have.  Previous surgeries you have had.  Medical conditions you have.  Possibility of pregnancy, if this applies.  Use of cigarettes, alcohol, or illegal drugs. RISKS AND COMPLICATIONS Generally, this is a safe procedure. However, as with any procedure, problems can occur. Possible problems include:  Oversedation.  Trouble breathing on your own. You may need to have a breathing tube until you are awake and breathing on your own.  Allergic reaction to any of the medicines used for the procedure. BEFORE THE PROCEDURE  You may have blood tests done. These tests can help show how well your kidneys and liver are working. They can also show how well your blood clots.  A physical exam will be done.  Only take medicines as directed by your health care provider. You may need to stop taking medicines (such as blood thinners, aspirin, or nonsteroidal anti-inflammatory drugs) before the procedure.   Do not eat or drink at least 6 hours before the procedure or as directed by your health care provider.  Arrange for a responsible adult, family member, or friend to take you home after the procedure. He or she should stay  with you for at least 24 hours after the procedure, until the medicine has worn off. PROCEDURE   An intravenous (IV) catheter will be inserted into one of your veins. Medicine will be able to flow directly into your body through this catheter. You may be given medicine through this tube to help prevent pain and help you relax.  The medical or dental procedure will be done. AFTER THE PROCEDURE  You will stay in a recovery area until the medicine has worn off. Your blood pressure and pulse will be checked.   Depending on the procedure you had, you may be allowed to go home when you can tolerate liquids and your pain is under control. Document Released: 04/14/2001 Document Revised: 07/25/2013 Document Reviewed: 03/27/2013 [facility] Patient Information 2015 [place], [place]. This information is not intended to replace advice given to you by your health care provider. Make sure you discuss any questions you have with your health care provider. Conscious Sedation, Adult, Care After Refer to this sheet in the next few weeks. These instructions provide you with information on caring for yourself after your procedure. Your health care provider may also give you more specific instructions. Your treatment has been planned according to current medical practices, but problems sometimes occur. Call your health care provider if you have any problems or questions after your procedure. WHAT TO EXPECT AFTER THE PROCEDURE  After your procedure:  You may feel sleepy, clumsy, and have poor balance for several hours.  Vomiting may occur if you eat too soon after the procedure. HOME CARE INSTRUCTIONS  Do not participate  in any activities where you could become injured for at least 24 hours. Do not:  Drive.  Swim.  Ride a bicycle.  Operate heavy machinery.  Cook.  Use power tools.  Climb ladders.  Work from a high place.  Do not make important decisions or sign legal documents until you are  improved.  If you vomit, drink water, juice, or soup when you can drink without vomiting. Make sure you have little or no nausea before eating solid foods.  Only take over-the-counter or prescription medicines for pain, discomfort, or fever as directed by your health care provider.  Make sure you and your family fully understand everything about the medicines given to you, including what side effects may occur.  You should not drink alcohol, take sleeping pills, or take medicines that cause drowsiness for at least 24 hours.  If you smoke, do not smoke without supervision.  If you are feeling better, you may resume normal activities 24 hours after you were sedated.  Keep all appointments with your health care provider. SEEK MEDICAL CARE IF:  Your skin is pale or bluish in color.  You continue to feel nauseous or vomit.  Your pain is getting worse and is not helped by medicine.  You have bleeding or swelling.  You are still sleepy or feeling clumsy after 24 hours. SEEK IMMEDIATE MEDICAL CARE IF:  You develop a rash.  You have difficulty breathing.  You develop any type of allergic problem.  You have a fever. MAKE SURE YOU:  Understand these instructions.  Will watch your condition.  Will get help right away if you are not doing well or get worse. Document Released: 05/10/2013 Document Reviewed: 05/10/2013 [facility] Patient Information 2015 [place], [place]. This information is not intended to replace advice given to you by your health care provider. Make sure you discuss any questions you have with your health care provider.

## 2014-04-04 NOTE — Sedation Documentation (Signed)
At time of tube placement, Pt. Pain level increased. C/o trouble breathing. Hx of COPD. Pt previous visit for tube placement had the same effect. MD at bedside. Dilaudid 2 mg ordered for treatment. Pt transferred to bed, head up. O2 sats 95%. Ketorolac 30mg  ordered, given. Pt breathing better. Pt reports pain is better. Continue monitoring.

## 2014-04-18 ENCOUNTER — Telehealth: Payer: Self-pay | Admitting: Emergency Medicine

## 2014-04-18 MED ORDER — PREDNISONE 10 MG PO TABS: ORAL_TABLET | ORAL | Status: DC

## 2014-04-18 MED ORDER — CHLORPHENIRAMINE MALEATE 4 MG PO TABS: 4.0000 mg | ORAL_TABLET | Freq: Four times a day (QID) | ORAL | Status: AC | PRN

## 2014-04-18 NOTE — Telephone Encounter (Signed)
Rx did not go through since it was phoned in  I have sent this electronically  Nothing further needed Pt aware

## 2014-04-18 NOTE — Telephone Encounter (Signed)
Call in pred taper - Take 40mg  daily for 3 days, then 30mg  daily for 3 days, then 20mg  daily for 3 days, then 10mg  daily for 3 days, then stop Have him start chlorpheniramine OTC 4mg  q6h prn if he is not already doing so.  If no better in few days needs to be seen

## 2014-04-18 NOTE — Telephone Encounter (Signed)
Called spoke with patient and advised of RB's recs  Pt verbalized his understanding and denied any further questions/concerns at this time He will contact the office again if his symptoms do not improve or worsen Pt requests the chlorpheniramine be sent to the pharmacy as he stated his insurance does pay for otc meds Rx's sent to verified pharmacy Nothing further needed at this time; will sign off.

## 2014-04-18 NOTE — Telephone Encounter (Signed)
Called spoke with patient who reports throat congestion, white phlegm, some wheezing, increased SOB, tightness in chest, denies fever, hemoptysis, a little head congestion but mainly in chest, PND and hoarseness x2 days.  Advised pt RB not in office today, pt is requesting rx be sent to [facility] in [place] NKDA - verified  Dr Lamonte Sakai please advise, thank you. **RB paged at [phone]

## 2014-04-21 ENCOUNTER — Other Ambulatory Visit: Payer: Self-pay | Admitting: Emergency Medicine

## 2014-04-27 ENCOUNTER — Other Ambulatory Visit (HOSPITAL_COMMUNITY): Payer: Self-pay | Admitting: Interventional Radiology

## 2014-04-27 DIAGNOSIS — Z8719 Personal history of other diseases of the digestive system: Secondary | ICD-10-CM

## 2014-05-07 ENCOUNTER — Other Ambulatory Visit: Payer: Self-pay | Admitting: Emergency Medicine

## 2014-05-07 ENCOUNTER — Telehealth: Payer: Self-pay | Admitting: Emergency Medicine

## 2014-05-07 MED ORDER — ALBUTEROL SULFATE HFA 108 (90 BASE) MCG/ACT IN AERS: INHALATION_SPRAY | RESPIRATORY_TRACT | Status: DC

## 2014-05-07 NOTE — Telephone Encounter (Signed)
Spoke with pt - requesting albuterol hfa rx sent to [facility] in [place]. Rx sent. Pt aware and voiced no further questions or concerns at this time.

## 2014-05-17 ENCOUNTER — Other Ambulatory Visit: Payer: Self-pay | Admitting: Radiology

## 2014-05-24 ENCOUNTER — Encounter (HOSPITAL_COMMUNITY): Payer: Self-pay | Admitting: Pharmacy Technician

## 2014-05-30 ENCOUNTER — Other Ambulatory Visit (HOSPITAL_COMMUNITY): Payer: Self-pay | Admitting: Interventional Radiology

## 2014-05-30 ENCOUNTER — Ambulatory Visit (HOSPITAL_COMMUNITY)
Admission: RE | Admit: 2014-05-30 | Discharge: 2014-05-30 | Disposition: A | Discharge status: Home / Self Care | Payer: Medicare Other | Source: Ambulatory Visit | Attending: Interventional Radiology | Admitting: Interventional Radiology

## 2014-05-30 DIAGNOSIS — Z8719 Personal history of other diseases of the digestive system: Secondary | ICD-10-CM

## 2014-05-30 DIAGNOSIS — K8010 Calculus of gallbladder with chronic cholecystitis without obstruction: Secondary | ICD-10-CM | POA: Insufficient documentation

## 2014-05-30 MED ORDER — LIDOCAINE HCL 1 % IJ SOLN
INTRAMUSCULAR | Status: AC
  Filled 2014-05-30: qty 20

## 2014-05-30 NOTE — Procedures (Signed)
Procedure:  Cholecystostomy tube exchange Findings:  New 10 Fr tube exchange over guidewire.  Tube in gallbladder. Continue gravity bag drainage.

## 2014-06-20 ENCOUNTER — Other Ambulatory Visit: Payer: Self-pay | Admitting: Emergency Medicine

## 2014-06-21 ENCOUNTER — Telehealth: Payer: Self-pay | Admitting: Emergency Medicine

## 2014-06-21 MED ORDER — ALBUTEROL SULFATE HFA 108 (90 BASE) MCG/ACT IN AERS: 2.0000 | INHALATION_SPRAY | RESPIRATORY_TRACT | Status: DC | PRN

## 2014-06-21 NOTE — Telephone Encounter (Signed)
Called and spoke with carolyn and she is aware of refill that has been sent to the pts pharmacy in [place]. Nothing further is needed.

## 2014-06-22 NOTE — Telephone Encounter (Signed)
RX was sent in 06/21/14.

## 2014-06-27 ENCOUNTER — Other Ambulatory Visit: Payer: Self-pay | Admitting: Emergency Medicine

## 2014-07-18 ENCOUNTER — Other Ambulatory Visit: Payer: Self-pay | Admitting: Emergency Medicine

## 2014-07-24 ENCOUNTER — Other Ambulatory Visit: Payer: Self-pay | Admitting: Emergency Medicine

## 2014-07-25 ENCOUNTER — Other Ambulatory Visit (HOSPITAL_COMMUNITY): Payer: Medicare Other

## 2014-07-25 ENCOUNTER — Inpatient Hospital Stay (HOSPITAL_COMMUNITY): Admission: RE | Admit: 2014-07-25 | Payer: Medicare Other | Source: Ambulatory Visit

## 2014-08-02 ENCOUNTER — Other Ambulatory Visit (HOSPITAL_COMMUNITY): Payer: Self-pay | Admitting: Interventional Radiology

## 2014-08-02 ENCOUNTER — Ambulatory Visit (HOSPITAL_COMMUNITY)
Admission: RE | Admit: 2014-08-02 | Discharge: 2014-08-02 | Disposition: A | Discharge status: Home / Self Care | Payer: Medicare Other | Source: Ambulatory Visit | Attending: Interventional Radiology | Admitting: Interventional Radiology

## 2014-08-02 DIAGNOSIS — K819 Cholecystitis, unspecified: Secondary | ICD-10-CM | POA: Diagnosis present

## 2014-08-02 DIAGNOSIS — Z8719 Personal history of other diseases of the digestive system: Secondary | ICD-10-CM

## 2014-08-02 MED ORDER — IOHEXOL 300 MG/ML  SOLN
10.0000 mL | Freq: Once | INTRAMUSCULAR | Status: AC | PRN
  Administered 2014-08-02: 10 mL

## 2014-08-16 ENCOUNTER — Encounter (HOSPITAL_COMMUNITY): Payer: Self-pay | Admitting: General Surgery

## 2014-09-16 ENCOUNTER — Encounter (HOSPITAL_COMMUNITY): Payer: Self-pay | Admitting: Emergency Medicine

## 2014-09-16 ENCOUNTER — Inpatient Hospital Stay (HOSPITAL_COMMUNITY)
Admission: EM | Admit: 2014-09-16 | Discharge: 2014-09-18 | DRG: 192 | Disposition: A | Discharge status: Home / Self Care | Payer: Medicare Other | Attending: Internal Medicine | Admitting: Internal Medicine

## 2014-09-16 ENCOUNTER — Emergency Department (HOSPITAL_COMMUNITY): Payer: Medicare Other

## 2014-09-16 DIAGNOSIS — Z79899 Other long term (current) drug therapy: Secondary | ICD-10-CM | POA: Diagnosis not present

## 2014-09-16 DIAGNOSIS — Z6836 Body mass index (BMI) 36.0-36.9, adult: Secondary | ICD-10-CM

## 2014-09-16 DIAGNOSIS — J441 Chronic obstructive pulmonary disease with (acute) exacerbation: Principal | ICD-10-CM | POA: Diagnosis present

## 2014-09-16 DIAGNOSIS — R0902 Hypoxemia: Secondary | ICD-10-CM | POA: Diagnosis present

## 2014-09-16 DIAGNOSIS — Z9981 Dependence on supplemental oxygen: Secondary | ICD-10-CM

## 2014-09-16 DIAGNOSIS — Z96652 Presence of left artificial knee joint: Secondary | ICD-10-CM | POA: Diagnosis present

## 2014-09-16 DIAGNOSIS — E669 Obesity, unspecified: Secondary | ICD-10-CM | POA: Diagnosis present

## 2014-09-16 DIAGNOSIS — F17210 Nicotine dependence, cigarettes, uncomplicated: Secondary | ICD-10-CM | POA: Diagnosis present

## 2014-09-16 DIAGNOSIS — R000 Tachycardia, unspecified: Secondary | ICD-10-CM | POA: Diagnosis present

## 2014-09-16 DIAGNOSIS — I272 Other secondary pulmonary hypertension: Secondary | ICD-10-CM | POA: Diagnosis present

## 2014-09-16 DIAGNOSIS — Z720 Tobacco use: Secondary | ICD-10-CM

## 2014-09-16 DIAGNOSIS — R0602 Shortness of breath: Secondary | ICD-10-CM | POA: Diagnosis present

## 2014-09-16 DIAGNOSIS — G8929 Other chronic pain: Secondary | ICD-10-CM | POA: Diagnosis present

## 2014-09-16 DIAGNOSIS — K219 Gastro-esophageal reflux disease without esophagitis: Secondary | ICD-10-CM | POA: Diagnosis present

## 2014-09-16 DIAGNOSIS — F17200 Nicotine dependence, unspecified, uncomplicated: Secondary | ICD-10-CM | POA: Diagnosis present

## 2014-09-16 LAB — CBC
HCT: 48.3 % (ref 39.0–52.0)
Hemoglobin: 16.2 g/dL (ref 13.0–17.0)
MCH: 31.8 pg (ref 26.0–34.0)
MCHC: 33.5 g/dL (ref 30.0–36.0)
MCV: 94.9 fL (ref 78.0–100.0)
PLATELETS: 321 10*3/uL (ref 150–400)
RBC: 5.09 MIL/uL (ref 4.22–5.81)
RDW: 14.5 % (ref 11.5–15.5)
WBC: 17.9 10*3/uL — AB (ref 4.0–10.5)

## 2014-09-16 LAB — BASIC METABOLIC PANEL
ANION GAP: 6 (ref 5–15)
BUN: 25 mg/dL — ABNORMAL HIGH (ref 6–23)
CALCIUM: 9 mg/dL (ref 8.4–10.5)
CHLORIDE: 102 mmol/L (ref 96–112)
CO2: 28 mmol/L (ref 19–32)
Creatinine, Ser: 0.94 mg/dL (ref 0.50–1.35)
GFR calc Af Amer: >90 mL/min (ref >90)
GFR calc non Af Amer: 87 mL/min — ABNORMAL LOW (ref >90)
Glucose, Bld: 139 mg/dL — ABNORMAL HIGH (ref 70–99)
Potassium: 4.3 mmol/L (ref 3.5–5.1)
SODIUM: 136 mmol/L (ref 135–145)

## 2014-09-16 LAB — BRAIN NATRIURETIC PEPTIDE: B NATRIURETIC PEPTIDE 5: 34.7 pg/mL (ref 0.0–100.0)

## 2014-09-16 MED ORDER — ALBUTEROL (5 MG/ML) CONTINUOUS INHALATION SOLN
10.0000 mg/h | INHALATION_SOLUTION | RESPIRATORY_TRACT | Status: DC
  Administered 2014-09-16: 10 mg/h via RESPIRATORY_TRACT
  Filled 2014-09-16: qty 20

## 2014-09-16 MED ORDER — FLUTICASONE PROPIONATE 50 MCG/ACT NA SUSP
1.0000 | Freq: Every day | NASAL | Status: DC
  Administered 2014-09-16 – 2014-09-18 (×3): 1 via NASAL
  Filled 2014-09-16: qty 16

## 2014-09-16 MED ORDER — LEVOFLOXACIN IN D5W 750 MG/150ML IV SOLN
750.0000 mg | INTRAVENOUS | Status: DC
  Administered 2014-09-16 – 2014-09-17 (×2): 750 mg via INTRAVENOUS
  Filled 2014-09-16 (×2): qty 150

## 2014-09-16 MED ORDER — METHYLPREDNISOLONE SODIUM SUCC 125 MG IJ SOLR
125.0000 mg | Freq: Once | INTRAMUSCULAR | Status: AC
  Administered 2014-09-16: 125 mg via INTRAVENOUS
  Filled 2014-09-16: qty 2

## 2014-09-16 MED ORDER — ENOXAPARIN SODIUM 60 MG/0.6ML ~~LOC~~ SOLN
55.0000 mg | SUBCUTANEOUS | Status: DC
  Administered 2014-09-16 – 2014-09-17 (×2): 55 mg via SUBCUTANEOUS
  Filled 2014-09-16 (×4): qty 0.6

## 2014-09-16 MED ORDER — IPRATROPIUM-ALBUTEROL 0.5-2.5 (3) MG/3ML IN SOLN: 3.0000 mL | Freq: Once | RESPIRATORY_TRACT | Status: DC

## 2014-09-16 MED ORDER — SODIUM CHLORIDE 0.9 % IJ SOLN
3.0000 mL | Freq: Two times a day (BID) | INTRAMUSCULAR | Status: DC
Start: 2014-09-16 — End: 2014-09-18
  Administered 2014-09-17 (×2): 3 mL via INTRAVENOUS

## 2014-09-16 MED ORDER — ONDANSETRON HCL 4 MG PO TABS: 4.0000 mg | ORAL_TABLET | Freq: Four times a day (QID) | ORAL | Status: DC | PRN

## 2014-09-16 MED ORDER — ONDANSETRON HCL 4 MG/2ML IJ SOLN: 4.0000 mg | Freq: Four times a day (QID) | INTRAMUSCULAR | Status: DC | PRN

## 2014-09-16 MED ORDER — IPRATROPIUM-ALBUTEROL 0.5-2.5 (3) MG/3ML IN SOLN
3.0000 mL | RESPIRATORY_TRACT | Status: DC
  Administered 2014-09-16 – 2014-09-18 (×10): 3 mL via RESPIRATORY_TRACT
  Filled 2014-09-16 (×10): qty 3

## 2014-09-16 MED ORDER — NICOTINE 21 MG/24HR TD PT24
21.0000 mg | MEDICATED_PATCH | Freq: Every day | TRANSDERMAL | Status: DC
  Administered 2014-09-16 – 2014-09-18 (×3): 21 mg via TRANSDERMAL
  Filled 2014-09-16 (×3): qty 1

## 2014-09-16 MED ORDER — METHYLPREDNISOLONE SODIUM SUCC 125 MG IJ SOLR
60.0000 mg | Freq: Three times a day (TID) | INTRAMUSCULAR | Status: DC
  Administered 2014-09-17 – 2014-09-18 (×5): 60 mg via INTRAVENOUS
  Filled 2014-09-16 (×5): qty 2

## 2014-09-16 NOTE — ED Notes (Signed)
Per pt, states he feels like he has PNA-saw PCP and was placed on prednisone, levequin-states he doesn't feel any better

## 2014-09-16 NOTE — ED Provider Notes (Signed)
Medical screening examination/treatment/procedure(s) were conducted as a shared visit with non-physician practitioner(s) and myself.  I personally evaluated the patient during the encounter.   EKG Interpretation None     Patient given albuterol treatment as well as Solu-Medrol. Chest x-ray does not show pneumonia. He has failed outpatient therapy with Levaquin and prednisone already. We'll admit for COPD exacerbation  Leota Jacobsen, MD 09/16/14 1705
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
## 2014-09-16 NOTE — Progress Notes (Addendum)
RN notified admissions to start the POC discussion. Dr. Cruzita Lederer was listed as the attending and he is not available.  (RN was told by admissions that the [place] had been clarified in the fact that the RN was not suppose to call admissions).  RN is currently waiting to see if the attending doctor will change.

## 2014-09-16 NOTE — H&P (Signed)
History and Physical    Allen Green ACZ:[id number] DOB: 1951/05/21 DOA: 09/16/2014  Referring physician: Dr. Zenia Resides PCP: Lovett Calender, AMY, PA  Specialists: none   Chief Complaint: Shortness of breath  HPI: Allen Green is a 64 y.o. male has a past medical history significant for COPD, ongoing tobacco abuse of 2 packs per day, polycythemia due to chronic hypoxia, presents to the emergency room with a chief complaint of shortness of breath. Patient has been having increased shortness of breath and wheezing, he saw his primary care doctor about 5 days ago, he was placed on levofloxacin and prednisone, he improved for a couple of days then he got worse again. He denies any fever or chills, he denies any chest pain, he denies any abdominal pain, nausea vomiting or diarrhea. Unfortunately he continues to smoke. In the ED patient with diffuse wheezing, chest x-ray without evidence of pneumonia, has leukocytosis to 17.9. [facility] asked for admission for COPD exacerbation.  Review of Systems: As per history of present illness, otherwise 10 point review of systems negative   Past Medical History  Diagnosis Date  . COPD (chronic obstructive pulmonary disease)     Probable - long-standing tobacco abuse  . Chronic pain     Bilateral knees and shoulders  . GERD (gastroesophageal reflux disease)   . 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 [name]  . Pneumonia     January 2015  . Hepatitis     Hapatitis B 40 yrs ago   Past Surgical History  Procedure Laterality Date  . Right total knee arthroplasty  2003  . Left total knee arthroplasty  1998    "left knee replacement is broken" - 08/09/13   Social History:  reports that he has been smoking Cigarettes.  He started smoking about 52 years ago. He has a 100 pack-year smoking history. He has never used smokeless tobacco. He reports that he does not drink alcohol or use illicit drugs.  No Known Allergies  Family History  Problem Relation Age of Onset  .  Diabetes type II Father   . Coronary artery disease Father     MI in his 40s  . Diabetes Mother   . Diabetes Brother   . Diabetes Sister   . Asthma Sister     Prior to Admission medications   Medication Sig Start Date End Date Taking? Authorizing Provider  albuterol (VENTOLIN HFA) 108 (90 BASE) MCG/ACT inhaler Inhale 2 puffs into the lungs every 4 (four) hours as needed for wheezing or shortness of breath. 06/21/14  Yes Collene Gobble, MD  benzonatate (TESSALON) 200 MG capsule Take 200 mg by mouth 3 (three) times daily as needed for cough.   Yes Historical Provider, MD  fluticasone (FLONASE) 50 MCG/ACT nasal spray PLACE 2 SPRAYS INTO THE NOSE 2 (TWO) TIMES DAILY. 06/29/14  Yes Collene Gobble, MD  levofloxacin (LEVAQUIN) 750 MG tablet Take 750 mg by mouth daily. 09/11/14  Yes Historical Provider, MD  mometasone-formoterol (DULERA) 200-5 MCG/ACT AERO Inhale 2 puffs into the lungs 2 (two) times daily. 10/25/13  Yes Collene Gobble, MD  predniSONE (DELTASONE) 10 MG tablet See admin instructions. 4 tabs daily for 3 days, 3 tabs daily for 3 days, 2 tabs daily for 2 days; 1 tab daily for 2 days. 09/11/14  Yes Historical Provider, MD  PRESCRIPTION MEDICATION Take 5 mLs by mouth every 4 (four) hours as needed (cough). Cough medication   Yes Historical Provider, MD  [facility]  HANDIHALER 18 MCG inhalation capsule USE 1 PUFF DAILY 07/18/14  Yes Collene Gobble, MD  chlorpheniramine (CHLOR-TRIMETON) 4 MG tablet Take 1 tablet (4 mg total) by mouth every 6 (six) hours as needed for allergies. Patient not taking: Reported on 09/16/2014 04/18/14   Collene Gobble, MD  fluticasone (FLONASE) 50 MCG/ACT nasal spray PLACE 2 SPRAYS INTO THE NOSE 2 (TWO) TIMES DAILY. Patient not taking: Reported on 09/16/2014 07/24/14   Collene Gobble, MD   Physical Exam: Filed Vitals:   09/16/14 1555 09/16/14 1632 09/16/14 1724  BP: 106/65    Pulse: 98    Temp: 97.9 F (36.6 C) 98.6 F (37 C)   TempSrc: Oral Rectal   Resp: 24      SpO2: 96%  97%     General:  Appears in mild distress as he is getting his breathing treatment   Eyes: no scleral icterus  ENT: moist oropharynx  Neck: supple, no lymphadenopathy  Cardiovascular: regular rate without MRG; 2+ peripheral pulses, no JVD, no peripheral edema, tachycardic   Respiratory: Overall decreased breath sounds, moves air well, diffuse wheezing throughout both lung fields, no crackles   Abdomen: soft, non tender to palpation, positive bowel sounds,  Skin: no rashes  Musculoskeletal: normal bulk and tone, no joint swelling  Psychiatric: normal mood and affect  Neurologic: Nonfocal  Labs on Admission:  Basic Metabolic Panel:  Recent Labs Lab 09/16/14 1615  NA 136  K 4.3  CL 102  CO2 28  GLUCOSE 139*  BUN 25*  CREATININE 0.94  CALCIUM 9.0   CBC:  Recent Labs Lab 09/16/14 1615  WBC 17.9*  HGB 16.2  HCT 48.3  MCV 94.9  PLT 321   BNP (last 3 results)  Recent Labs  09/16/14 1616  BNP 34.7   Radiological Exams on Admission: Dg Chest 2 View  09/16/2014   CLINICAL DATA:  Cough. Chest tightness. Shortness of breath for 1 week. Pneumonia.  EXAM: CHEST  2 VIEW  COMPARISON:  05/10/2014 and multiple priors.  FINDINGS: Emphysema. Flattening of the hemidiaphragms. No airspace disease. No effusion. Basilar atelectasis associated with emphysema. Enlargement of the pulmonary arteries consistent with pulmonary arterial hypertension. There is a faint nodule in the RIGHT upper lobe. This has irregular spiculated margins and may represent pulmonary nodule or neoplasm.  IMPRESSION: 1. Emphysema without acute cardiopulmonary disease. 2. Pulmonary arterial hypertension. 3. 7 mm nodular density in the RIGHT upper lobe on the frontal view. Although this may represent scarring based on prior CT, followup noncontrast chest CT is recommended on a nonemergent basis to further assess.   Electronically Signed   By: Dereck Ligas M.D.   On: 09/16/2014 16:32    EKG:  Independently reviewed. Sinus rhythm  Assessment/Plan Principal Problem:   COPD with acute exacerbation Active Problems:   Smoker   COPD exacerbation    COPD exacerbation - patient not responding to oral agents as an outpatient. I suspect ongoing tobacco abuse plays a role in this. He has a nebulizing machine at home, however he has not tried to use in the last days. - Admit to telemetry - Chest x-ray without evidence of pneumonia, his cough is productive and slowly improving with clear sputum, continue levofloxacin - IV steroids - Duonebs every 4 hours  Tobacco abuse - counseled to quit - Nicotine patch  Obesity   Leukocytosis - likely due to recent prednisone; he is afebrile.     Diet: Heart healthy diet  Fluids: None  DVT  Prophylaxis: Lovenox    Code Status: Full code  Family Communication: d/w wife bedside  Disposition Plan: inpatient   Time spent: 53  Rubert Frediani M. Cruzita Lederer, MD [facility] Pager [phone]  If 7PM-7AM, please contact night-coverage [email or web address] Password [facility] 09/16/2014, 5:46 PM

## 2014-09-17 LAB — CBC
HEMATOCRIT: 46.8 % (ref 39.0–52.0)
Hemoglobin: 15.4 g/dL (ref 13.0–17.0)
MCH: 31.1 pg (ref 26.0–34.0)
MCHC: 32.9 g/dL (ref 30.0–36.0)
MCV: 94.5 fL (ref 78.0–100.0)
PLATELETS: 318 10*3/uL (ref 150–400)
RBC: 4.95 MIL/uL (ref 4.22–5.81)
RDW: 14.5 % (ref 11.5–15.5)
WBC: 14.6 10*3/uL — ABNORMAL HIGH (ref 4.0–10.5)

## 2014-09-17 LAB — BASIC METABOLIC PANEL
Anion gap: 7 (ref 5–15)
BUN: 25 mg/dL — AB (ref 6–23)
CO2: 28 mmol/L (ref 19–32)
CREATININE: 0.93 mg/dL (ref 0.50–1.35)
Calcium: 8.9 mg/dL (ref 8.4–10.5)
Chloride: 100 mmol/L (ref 96–112)
GFR calc Af Amer: >90 mL/min (ref >90)
GFR, EST NON AFRICAN AMERICAN: 88 mL/min — AB (ref >90)
GLUCOSE: 138 mg/dL — AB (ref 70–99)
Potassium: 4.3 mmol/L (ref 3.5–5.1)
Sodium: 135 mmol/L (ref 135–145)

## 2014-09-17 MED ORDER — INFLUENZA VAC SPLIT QUAD 0.5 ML IM SUSY
0.5000 mL | PREFILLED_SYRINGE | INTRAMUSCULAR | Status: AC
  Administered 2014-09-18: 0.5 mL via INTRAMUSCULAR
  Filled 2014-09-17 (×3): qty 0.5

## 2014-09-17 NOTE — Progress Notes (Signed)
PROGRESS NOTE  Allen Green [id number]:[id number] DOB: 10/26/1950 DOA: 09/16/2014 PCP: Linton Rump, PA  HPI: 64 y.o. male has a past medical history significant for COPD, ongoing tobacco abuse of 2 packs per day, polycythemia due to chronic hypoxia, presents to the emergency room with a chief complaint of shortness of breath. Patient has been having increased shortness of breath and wheezing, he saw his primary care doctor about 5 days ago, he was placed on levofloxacin and prednisone, he improved for a couple of days then he got worse again. He denies any fever or chills, he denies any chest pain, he denies any abdominal pain, nausea vomiting or diarrhea. Unfortunately he continues to smoke. In the ED patient with diffuse wheezing, chest x-ray without evidence of pneumonia, has leukocytosis to 17.9. [facility] asked for admission for COPD exacerbation.  Subjective / 24 H Interval events - he appreciates improvement today   Assessment/Plan: Principal Problem:   COPD with acute exacerbation Active Problems:   Smoker   COPD exacerbation   COPD exacerbation - patient not responding to oral agents as an outpatient. I suspect ongoing tobacco abuse plays a role in this. He has a nebulizing machine at home, however he has not tried to use in the last days. - improving, less wheezing, continue current management  Tobacco abuse - counseled to quit - Nicotine patch  Obesity   Leukocytosis - likely due to recent prednisone; he is afebrile.    Diet: Diet Heart Fluids: none  DVT Prophylaxis: Lovenox  Code Status: Full Code Family Communication: none bedside this morning   Disposition Plan: home when ready   Consultants:  None   Procedures:  None    Antibiotics  Anti-infectives    Start     Dose/Rate Route Frequency Ordered Stop   09/16/14 1900  levofloxacin (LEVAQUIN) IVPB 750 mg     750 mg 100 mL/hr over 90 Minutes Intravenous Every 24 hours 09/16/14 1856         Studies  Dg Chest  2 View  09/16/2014   CLINICAL DATA:  Cough. Chest tightness. Shortness of breath for 1 week. Pneumonia.  EXAM: CHEST  2 VIEW  COMPARISON:  05/10/2014 and multiple priors.  FINDINGS: Emphysema. Flattening of the hemidiaphragms. No airspace disease. No effusion. Basilar atelectasis associated with emphysema. Enlargement of the pulmonary arteries consistent with pulmonary arterial hypertension. There is a faint nodule in the RIGHT upper lobe. This has irregular spiculated margins and may represent pulmonary nodule or neoplasm.  IMPRESSION: 1. Emphysema without acute cardiopulmonary disease. 2. Pulmonary arterial hypertension. 3. 7 mm nodular density in the RIGHT upper lobe on the frontal view. Although this may represent scarring based on prior CT, followup noncontrast chest CT is recommended on a nonemergent basis to further assess.   Electronically Signed   By: Dereck Ligas M.D.   On: 09/16/2014 16:32    Objective  Filed Vitals:   09/16/14 1810 09/16/14 1945 09/16/14 2052 09/17/14 0508  BP:  156/81  92/58  Pulse:  64  98  Temp:  98.1 F (36.7 C)  97.9 F (36.6 C)  TempSrc:  Oral  Oral  Resp:  22  20  Height: 5\' 11"  (1.803 m) 5\' 11"  (1.803 m)    Weight: 113.399 kg (250 lb) 119.976 kg (264 lb 8 oz)    SpO2:  97% 96% 95%    Intake/Output Summary (Last 24 hours) at 09/17/14 0735 Last data filed at 09/17/14 0500  Gross per 24 hour  Intake    390 ml  Output    300 ml  Net     90 ml   Filed Weights   09/16/14 1810 09/16/14 1945  Weight: 113.399 kg (250 lb) 119.976 kg (264 lb 8 oz)    Exam:  General:  NAD  HEENT: no scleral icterus  Cardiovascular: RRR, tachycardic  Respiratory: decreased breath sounds overall, scattered wheezing, improved  Abdomen: obese, non tender  MSK/Extremities: no cyanosis  Skin: no rashes  Neuro: non focal   Data Reviewed: Basic Metabolic Panel:  Recent Labs Lab 09/16/14 1615 09/17/14 0411  NA 136 135  K 4.3 4.3  CL 102 100  CO2 28 28    GLUCOSE 139* 138*  BUN 25* 25*  CREATININE 0.94 0.93  CALCIUM 9.0 8.9   CBC:  Recent Labs Lab 09/16/14 1615 09/17/14 0411  WBC 17.9* 14.6*  HGB 16.2 15.4  HCT 48.3 46.8  MCV 94.9 94.5  PLT 321 318   BNP (last 3 results)  Recent Labs  09/16/14 1616  BNP 34.7     Scheduled Meds: . enoxaparin (LOVENOX) injection  55 mg Subcutaneous Q24H  . fluticasone  1 spray Each Nare Daily  . [START ON 09/18/2014] Influenza vac split quadrivalent PF  0.5 mL Intramuscular Tomorrow-1000  . ipratropium-albuterol  3 mL Nebulization Q4H  . levofloxacin (LEVAQUIN) IV  750 mg Intravenous Q24H  . methylPREDNISolone (SOLU-MEDROL) injection  60 mg Intravenous 3 times per day  . nicotine  21 mg Transdermal Daily  . sodium chloride  3 mL Intravenous Q12H   Continuous Infusions: . albuterol 10 mg/hr (09/16/14 1724)    Marzetta Board, MD [facility] Pager [phone]. If 7 PM - 7 AM, please contact night-coverage at [email or web address], password [place] 09/17/2014, 7:35 AM  LOS: 1 day

## 2014-09-17 NOTE — Evaluation (Signed)
Physical Therapy Evaluation Patient Details Name: Allen Green MRN: [id number] DOB: [birth year] Today's Date: 09/17/2014   History of Present Illness  64 y.o. male has a past medical history significant for COPD, ongoing tobacco abuse of 2 packs per day, polycythemia due to chronic hypoxia admitted with acute COPD exacerbation.  Clinical Impression  Pt admitted with above diagnosis. Pt currently with functional limitations due to the deficits listed below (see PT Problem List).  Pt ambulated short distance in hallway with increased DOE.  Pt reports being close to his baseline.  Pt educated in pursed lip breathing and reinforced him to quit smoking and especially not smoke while on oxygen (states he does this) and he reports understanding.  Spouse reports she encourages him daily to stop as well as his daughter who is a Therapist, sports.  Pt will benefit from skilled PT to increase their independence and safety with mobility to allow discharge to the venue listed below.  Pt has SPC and RW at home and will likely return to baseline upon d/c.     Follow Up Recommendations No PT follow up    Equipment Recommendations  None recommended by PT    Recommendations for Other Services       Precautions / Restrictions Precautions Precautions: Fall Precaution Comments: chronic O2, monitor sats Restrictions Weight Bearing Restrictions: No      Mobility  Bed Mobility Overal bed mobility: Needs Assistance Bed Mobility: Supine to Sit;Sit to Supine     Supine to sit: Supervision Sit to supine: Supervision   General bed mobility comments: increased used of momentum to self assist  Transfers Overall transfer level: Needs assistance Equipment used: Rolling walker (2 wheeled) Transfers: Sit to/from Stand Sit to Stand: Min guard         General transfer comment: decreased control of descent  Ambulation/Gait Ambulation/Gait assistance: Min guard Ambulation Distance (Feet): 80 Feet Assistive device:  Rolling walker (2 wheeled) Gait Pattern/deviations: Step-through pattern;Trunk flexed     General Gait Details: increased L knee varus therefore pt uses assistive device, typically only short distances at home, remained on 2L O2 [place] and encouraged pursed lip breathing technique, 4/4 dyspnea  Stairs            Wheelchair Mobility    Modified Rankin (Stroke Patients Only)       Balance                                             Pertinent Vitals/Pain Pain Assessment: No/denies pain  Pt on chronic 2L O2 [place], performed ambulation on 2L with SpO2 difficult reading however eventually read 96%, pt reports occasionally increasing to 3-4L with activity at home.    Home Living Family/patient expects to be discharged to:: Private residence Living Arrangements: Spouse/significant other   Type of Home: [place]: [place]: Environmental consultant - 2 wheels;Cane - single point       Prior Function Level of Independence: Independent with assistive device(s)         Comments: typically uses SPC, ambulatory approx 50 feet, usually on 2L at rest, sometimes increases 3-4L with activity, has pulse oximeter at home     Hand Dominance        Extremity/Trunk Assessment               Lower Extremity Assessment:  LLE deficits/detail   LLE Deficits / Details: 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" therefore increased varus deformity present therefore typically uses SPC however reports not painful     Communication   Communication: HOH  Cognition Arousal/Alertness: Awake/alert Behavior During Therapy: WFL for tasks assessed/performed Overall Cognitive Status: Within Functional Limits for tasks assessed                      General Comments      Exercises        Assessment/Plan    PT Assessment Patient needs continued PT services  PT Diagnosis Difficulty walking   PT Problem List Decreased mobility;Cardiopulmonary status  limiting activity;Obesity  PT Treatment Interventions DME instruction;Gait training;Functional mobility training;Therapeutic activities;Therapeutic exercise;Patient/family education   PT Goals (Current goals can be found in the Care Plan section) Acute Rehab PT Goals PT Goal Formulation: With patient Time For Goal Achievement: 09/24/14 Potential to Achieve Goals: Good    Frequency Min 3X/week   Barriers to discharge        Co-evaluation               End of Session Equipment Utilized During Treatment: Oxygen Activity Tolerance: Patient limited by fatigue Patient left: in bed;with call bell/phone within reach;with family/visitor present           Time: 1353-1406 PT Time Calculation (min) (ACUTE ONLY): 13 min   Charges:   PT Evaluation $Initial PT Evaluation Tier I: 1 Procedure     PT G Codes:        Jonte Wollam,KATHrine E 09/17/2014, 2:45 PM Carmelia Bake, PT, DPT 09/17/2014 Pager: [phone]

## 2014-09-18 DIAGNOSIS — J441 Chronic obstructive pulmonary disease with (acute) exacerbation: Secondary | ICD-10-CM | POA: Diagnosis not present

## 2014-09-18 MED ORDER — NICOTINE 21 MG/24HR TD PT24: 21.0000 mg | MEDICATED_PATCH | Freq: Every day | TRANSDERMAL | Status: AC

## 2014-09-18 MED ORDER — ALBUTEROL SULFATE (2.5 MG/3ML) 0.083% IN NEBU: 2.5000 mg | INHALATION_SOLUTION | Freq: Four times a day (QID) | RESPIRATORY_TRACT | Status: AC | PRN

## 2014-09-18 MED ORDER — PREDNISONE 10 MG PO TABS: ORAL_TABLET | ORAL | Status: DC

## 2014-09-18 NOTE — Discharge Instructions (Signed)
Follow with BOUCHERLE, AMY, PA in 2 weeks  Please get a complete blood count and chemistry panel checked by your Primary MD at your next visit, and again as instructed by your Primary MD. Please get your medications reviewed and adjusted by your Primary MD.  Please request your Primary MD to go over all Hospital Tests and Procedure/Radiological results at the follow up, please get all Hospital records sent to your Prim MD by signing hospital release before you go home.  If you had Pneumonia of Lung problems at the Hospital: Please get a 2 view Chest X ray done in 6-8 weeks after hospital discharge or sooner if instructed by your Primary MD.  If you have Congestive Heart Failure: Please call your Cardiologist or Primary MD anytime you have any of the following symptoms:  1) 3 pound weight gain in 24 hours or 5 pounds in 1 week  2) shortness of breath, with or without a dry hacking cough  3) swelling in the hands, feet or stomach  4) if you have to sleep on extra pillows at night in order to breathe  Follow cardiac low salt diet and 1.5 lit/day fluid restriction.  If you have diabetes Accuchecks 4 times/day, Once in AM empty stomach and then before each meal. Log in all results and show them to your primary doctor at your next visit. If any glucose reading is under 80 or above 300 call your primary MD immediately.  If you have Seizure/Convulsions/Epilepsy: Please do not drive, operate heavy machinery, participate in activities at heights or participate in high speed sports until you have seen by Primary MD or a Neurologist and advised to do so again.  If you had Gastrointestinal Bleeding: Please ask your Primary MD to check a complete blood count within one week of discharge or at your next visit. Your endoscopic/colonoscopic biopsies that are pending at the time of discharge, will also need to followed by your Primary MD.  Get Medicines reviewed and adjusted. Please take all your  medications with you for your next visit with your Primary MD  Please request your Primary MD to go over all hospital tests and procedure/radiological results at the follow up, please ask your Primary MD to get all Hospital records sent to his/her office.  If you experience worsening of your admission symptoms, develop shortness of breath, life threatening emergency, suicidal or homicidal thoughts you must seek medical attention immediately by calling 911 or calling your MD immediately  if symptoms less severe.  You must read complete instructions/literature along with all the possible adverse reactions/side effects for all the Medicines you take and that have been prescribed to you. Take any new Medicines after you have completely understood and accpet all the possible adverse reactions/side effects.   Do not drive or operate heavy machinery when taking Pain medications.   Do not take more than prescribed Pain, Sleep and Anxiety Medications  Special Instructions: If you have smoked or chewed Tobacco  in the last 2 yrs please stop smoking, stop any regular Alcohol  and or any Recreational drug use.  Wear Seat belts while driving.  Please note You were cared for by a hospitalist during your hospital stay. If you have any questions about your discharge medications or the care you received while you were in the hospital after you are discharged, you can call the unit and asked to speak with the hospitalist on call if the hospitalist that took care of you is not available. Once  you are discharged, your primary care physician will handle any further medical issues. Please note that NO REFILLS for any discharge medications will be authorized once you are discharged, as it is imperative that you return to your primary care physician (or establish a relationship with a primary care physician if you do not have one) for your aftercare needs so that they can reassess your need for medications and monitor your  lab values.  You can reach the hospitalist office at phone [phone] or fax [phone]   If you do not have a primary care physician, you can call [phone] for a physician referral.  Activity: As tolerated with Full fall precautions use walker/cane & assistance as needed  Diet: heart healthy  Disposition Home

## 2014-09-18 NOTE — Progress Notes (Signed)
   09/18/14 5427  What Happened  Was fall witnessed? No  Was patient injured? No  Patient found on floor  Found by Staff-comment (Tammy ST and Marketta Valadez RN)  Stated prior activity shower  Follow Up  MD notified Dr. Renne Crigler  Time MD notified 1020  Family notified No- patient refusal  Additional tests No  Simple treatment (none)  Progress note created (see row info) Yes  Adult Fall Risk Assessment  Risk Factor Category (scoring not indicated) Fall has occurred during this admission (document High fall risk)  Patient's Fall Risk High Fall Risk (>13 points)  Adult Fall Risk Interventions  Required Bundle Interventions *See Row Information* High fall risk - low, moderate, and high requirements implemented  Additional Interventions Bed alarm not indicated with the bundle;Fall risk signage;Individualized elimination schedule  Vitals  BP 120/68 mmHg  BP Location Right Arm  BP Method Automatic  Patient Position (if appropriate) Sitting  Pain Assessment  Pain Assessment 0-10  Pain Score 0  PCA/Epidural/Spinal Assessment  Respiratory Pattern Labored;Dyspnea at rest;Symmetrical  Neurological  Neuro (WDL) WDL  Musculoskeletal  Musculoskeletal (WDL) X  Assistive Device BSC  Generalized Weakness Yes  Integumentary  Integumentary (WDL) WDL

## 2014-09-18 NOTE — Progress Notes (Signed)
09/18/14  0945  Patient fell coming out of the shower.  Patient states he only slip and hit right elbow. Rt elbow has a little redness, skin in tact.  Per pt "I'm fine."

## 2014-09-18 NOTE — Discharge Summary (Signed)
Physician Discharge Summary  VERLON PISCHKE IDP:[id number] DOB: 1950/09/24 DOA: 09/16/2014  PCP: Linton Rump, PA  Admit date: 09/16/2014 Discharge date: 09/18/2014  Time spent: 35 minutes  Recommendations for Outpatient Follow-up:  1. Follow up with PCP in 1-2 weeks 2. Follow up with Dr. Lamonte Sakai in 2 weeks 3. Follow up with IR as per previous plans   Discharge Diagnoses:  Principal Problem:   COPD with acute exacerbation Active Problems:   Smoker   COPD exacerbation  Discharge Condition: stable  Diet recommendation: heart healthy  Filed Weights   09/16/14 1810 09/16/14 1945  Weight: 113.399 kg (250 lb) 119.976 kg (264 lb 8 oz)    History of present illness:  Allen Green is a 64 y.o. male has a past medical history significant for COPD, ongoing tobacco abuse of 2 packs per day, polycythemia due to chronic hypoxia, presents to the emergency room with a chief complaint of shortness of breath. Patient has been having increased shortness of breath and wheezing, he saw his primary care doctor about 5 days ago, he was placed on levofloxacin and prednisone, he improved for a couple of days then he got worse again. He denies any fever or chills, he denies any chest pain, he denies any abdominal pain, nausea vomiting or diarrhea. Unfortunately he continues to smoke. In the ED patient with diffuse wheezing, chest x-ray without evidence of pneumonia, has leukocytosis to 17.9. [facility] asked for admission for COPD exacerbation.  Hospital Course:  Patient was admitted with COPD exacerbation and was placed on IV steroids and scheduled nebulizers as well as IV Levofloxacin. His breathing gradually improved and by 2/16 he was feeling back to baseline. He has a nebulizer machine at home however did not try to use it as his breathing was getting worse at home. Unfortunately he continues to smoke 2 packs per day, sometimes with the oxygen on. Clinically he was back to his baseline, and was transitioned to oral  steroids as a taper, he is to continue his Levaquin as prescribed prior to hospitalization. Given reported weakness, PT evaluated patient while hospitalized. He was advised strongly for tobacco cessation and will have to follow up closely with his PCP and his Pulmonologist.    Procedures:  None    Consultations:  None   Discharge Exam: Filed Vitals:   09/18/14 0451 09/18/14 0456 09/18/14 0922 09/18/14 0939  BP: 113/69   120/68  Pulse: 93     Temp: 97.6 F (36.4 C)     TempSrc: Oral     Resp: 20     Height:      Weight:      SpO2: 95% 97% 96%    General: NAD Cardiovascular: RRR Respiratory: decreased breath sounds, minimal wheezing.   Discharge Instructions    Medication List    TAKE these medications        albuterol 108 (90 BASE) MCG/ACT inhaler  Commonly known as:  VENTOLIN HFA  Inhale 2 puffs into the lungs every 4 (four) hours as needed for wheezing or shortness of breath.     albuterol (2.5 MG/3ML) 0.083% nebulizer solution  Commonly known as:  PROVENTIL  Take 3 mLs (2.5 mg total) by nebulization every 6 (six) hours as needed for wheezing or shortness of breath.     benzonatate 200 MG capsule  Commonly known as:  TESSALON  Take 200 mg by mouth 3 (three) times daily as needed for cough.     chlorpheniramine 4 MG tablet  Commonly known as:  CHLOR-TRIMETON  Take 1 tablet (4 mg total) by mouth every 6 (six) hours as needed for allergies.     fluticasone 50 MCG/ACT nasal spray  Commonly known as:  FLONASE  PLACE 2 SPRAYS INTO THE NOSE 2 (TWO) TIMES DAILY.     levofloxacin 750 MG tablet  Commonly known as:  LEVAQUIN  Take 750 mg by mouth daily.     mometasone-formoterol 200-5 MCG/ACT Aero  Commonly known as:  DULERA  Inhale 2 puffs into the lungs 2 (two) times daily.     nicotine 21 mg/24hr patch  Commonly known as:  NICODERM CQ - dosed in mg/24 hours  Place 1 patch (21 mg total) onto the skin daily.     predniSONE 10 MG tablet  Commonly known as:   DELTASONE  4 tabs daily for 3 days, 3 tabs daily for 3 days, 2 tabs daily for 2 days; 1 tab daily for 2 days.     PRESCRIPTION MEDICATION  Take 5 mLs by mouth every 4 (four) hours as needed (cough). Cough medication     SPIRIVA HANDIHALER 18 MCG inhalation capsule  Generic drug:  tiotropium  USE 1 PUFF DAILY           Follow-up Information    Follow up with BOUCHERLE, AMY, PA. Schedule an appointment as soon as possible for a visit in 2 weeks.   Specialty:  Internal Medicine   Contact information:   [address] [place] [address] [phone]       Follow up with Collene Gobble., MD. Schedule an appointment as soon as possible for a visit in 1 week.   Specialty:  Pulmonary Disease   Contact information:   [address] [place] [address] [phone]       The results of significant diagnostics from this hospitalization (including imaging, microbiology, ancillary and laboratory) are listed below for reference.    Significant Diagnostic Studies: Dg Chest 2 View  09/16/2014   CLINICAL DATA:  Cough. Chest tightness. Shortness of breath for 1 week. Pneumonia.  EXAM: CHEST  2 VIEW  COMPARISON:  05/10/2014 and multiple priors.  FINDINGS: Emphysema. Flattening of the hemidiaphragms. No airspace disease. No effusion. Basilar atelectasis associated with emphysema. Enlargement of the pulmonary arteries consistent with pulmonary arterial hypertension. There is a faint nodule in the RIGHT upper lobe. This has irregular spiculated margins and may represent pulmonary nodule or neoplasm.  IMPRESSION: 1. Emphysema without acute cardiopulmonary disease. 2. Pulmonary arterial hypertension. 3. 7 mm nodular density in the RIGHT upper lobe on the frontal view. Although this may represent scarring based on prior CT, followup noncontrast chest CT is recommended on a nonemergent basis to further assess.   Electronically Signed   By: Dereck Ligas M.D.   On: 09/16/2014 16:32    Labs: Basic Metabolic Panel:  Recent Labs Lab 09/16/14 1615 09/17/14 0411  NA 136 135  K 4.3 4.3  CL 102 100  CO2 28 28  GLUCOSE 139* 138*  BUN 25* 25*  CREATININE 0.94 0.93  CALCIUM 9.0 8.9   CBC:  Recent Labs Lab 09/16/14 1615 09/17/14 0411  WBC 17.9* 14.6*  HGB 16.2 15.4  HCT 48.3 46.8  MCV 94.9 94.5  PLT 321 318    BNP (last 3 results)  Recent Labs  09/16/14 1616  BNP 34.7    Signed:  GHERGHE, COSTIN  [facility] 09/18/2014, 1:38 PM

## 2014-09-18 NOTE — Progress Notes (Signed)
09/18/14  0930  Reviewed discharge instructions with patient. Patient verbalized understanding of discharge instructions. Copy of discharge instructions and prescriptions given to patient.

## 2014-09-26 ENCOUNTER — Other Ambulatory Visit: Payer: Self-pay | Admitting: Emergency Medicine

## 2014-09-27 ENCOUNTER — Other Ambulatory Visit (HOSPITAL_COMMUNITY): Payer: Medicare Other

## 2014-10-04 ENCOUNTER — Other Ambulatory Visit (HOSPITAL_COMMUNITY): Payer: Self-pay | Admitting: Interventional Radiology

## 2014-10-04 ENCOUNTER — Ambulatory Visit (HOSPITAL_COMMUNITY)
Admission: RE | Admit: 2014-10-04 | Discharge: 2014-10-04 | Disposition: A | Discharge status: Home / Self Care | Payer: Medicare Other | Source: Ambulatory Visit | Attending: Interventional Radiology | Admitting: Interventional Radiology

## 2014-10-04 DIAGNOSIS — Z8719 Personal history of other diseases of the digestive system: Secondary | ICD-10-CM

## 2014-10-04 DIAGNOSIS — Z4682 Encounter for fitting and adjustment of non-vascular catheter: Secondary | ICD-10-CM | POA: Diagnosis not present

## 2014-10-04 DIAGNOSIS — K819 Cholecystitis, unspecified: Secondary | ICD-10-CM | POA: Insufficient documentation

## 2014-10-04 MED ORDER — IOHEXOL 300 MG/ML  SOLN
20.0000 mL | Freq: Once | INTRAMUSCULAR | Status: AC | PRN
  Administered 2014-10-04: 10 mL

## 2014-10-04 NOTE — Procedures (Signed)
Interventional Radiology Procedure Note  Procedure: Drain exchange into gallbladder, routine exchange.  .  Complications: No immediate Findings:  The cystic duct is patent.  There is a filling defect in the neck of the gallbladder, probably stone.   Recommendations:   - Routine care   Signed,  Dulcy Fanny. Earleen Newport, DO

## 2014-10-09 ENCOUNTER — Telehealth: Payer: Self-pay | Admitting: Emergency Medicine

## 2014-10-09 ENCOUNTER — Other Ambulatory Visit: Payer: Self-pay | Admitting: Emergency Medicine

## 2014-10-09 MED ORDER — TIOTROPIUM BROMIDE MONOHYDRATE 18 MCG IN CAPS: ORAL_CAPSULE | RESPIRATORY_TRACT | Status: DC

## 2014-10-09 MED ORDER — ALBUTEROL SULFATE HFA 108 (90 BASE) MCG/ACT IN AERS: 2.0000 | INHALATION_SPRAY | RESPIRATORY_TRACT | Status: AC | PRN

## 2014-10-09 NOTE — Telephone Encounter (Signed)
Pt needs refill of Ventolin HFA and Spiriva handihaler.  Needs OV for further refills.  Pt scheduled for OV with RB 11/29/14 at 430. Refills sent to pharmacy.  Nothing further needed.

## 2014-10-24 ENCOUNTER — Other Ambulatory Visit: Payer: Self-pay | Admitting: Emergency Medicine

## 2014-11-11 ENCOUNTER — Emergency Department (HOSPITAL_COMMUNITY)
Admission: EM | Admit: 2014-11-11 | Discharge: 2014-11-11 | Disposition: A | Discharge status: Home / Self Care | Payer: Medicare Other | Attending: Emergency Medicine | Admitting: Emergency Medicine

## 2014-11-11 ENCOUNTER — Emergency Department (HOSPITAL_COMMUNITY): Payer: Medicare Other

## 2014-11-11 ENCOUNTER — Encounter (HOSPITAL_COMMUNITY): Payer: Self-pay | Admitting: Emergency Medicine

## 2014-11-11 ENCOUNTER — Other Ambulatory Visit: Payer: Self-pay | Admitting: Emergency Medicine

## 2014-11-11 DIAGNOSIS — J159 Unspecified bacterial pneumonia: Secondary | ICD-10-CM | POA: Insufficient documentation

## 2014-11-11 DIAGNOSIS — Z720 Tobacco use: Secondary | ICD-10-CM | POA: Insufficient documentation

## 2014-11-11 DIAGNOSIS — G8929 Other chronic pain: Secondary | ICD-10-CM | POA: Diagnosis not present

## 2014-11-11 DIAGNOSIS — M25512 Pain in left shoulder: Secondary | ICD-10-CM | POA: Insufficient documentation

## 2014-11-11 DIAGNOSIS — J189 Pneumonia, unspecified organism: Secondary | ICD-10-CM

## 2014-11-11 DIAGNOSIS — M549 Dorsalgia, unspecified: Secondary | ICD-10-CM

## 2014-11-11 DIAGNOSIS — Z8719 Personal history of other diseases of the digestive system: Secondary | ICD-10-CM | POA: Insufficient documentation

## 2014-11-11 DIAGNOSIS — Z862 Personal history of diseases of the blood and blood-forming organs and certain disorders involving the immune mechanism: Secondary | ICD-10-CM | POA: Insufficient documentation

## 2014-11-11 DIAGNOSIS — Z7952 Long term (current) use of systemic steroids: Secondary | ICD-10-CM | POA: Insufficient documentation

## 2014-11-11 DIAGNOSIS — R531 Weakness: Secondary | ICD-10-CM | POA: Insufficient documentation

## 2014-11-11 DIAGNOSIS — R0602 Shortness of breath: Secondary | ICD-10-CM | POA: Diagnosis present

## 2014-11-11 DIAGNOSIS — J441 Chronic obstructive pulmonary disease with (acute) exacerbation: Secondary | ICD-10-CM | POA: Insufficient documentation

## 2014-11-11 DIAGNOSIS — R0600 Dyspnea, unspecified: Secondary | ICD-10-CM

## 2014-11-11 LAB — BASIC METABOLIC PANEL
ANION GAP: 8 (ref 5–15)
BUN: 15 mg/dL (ref 6–23)
CALCIUM: 9.1 mg/dL (ref 8.4–10.5)
CHLORIDE: 102 mmol/L (ref 96–112)
CO2: 28 mmol/L (ref 19–32)
Creatinine, Ser: 0.89 mg/dL (ref 0.50–1.35)
GFR calc Af Amer: >90 mL/min (ref >90)
GFR calc non Af Amer: 89 mL/min — ABNORMAL LOW (ref >90)
GLUCOSE: 111 mg/dL — AB (ref 70–99)
Potassium: 4.3 mmol/L (ref 3.5–5.1)
SODIUM: 138 mmol/L (ref 135–145)

## 2014-11-11 LAB — CBC
HEMATOCRIT: 45 % (ref 39.0–52.0)
HEMOGLOBIN: 15.3 g/dL (ref 13.0–17.0)
MCH: 31.9 pg (ref 26.0–34.0)
MCHC: 34 g/dL (ref 30.0–36.0)
MCV: 93.9 fL (ref 78.0–100.0)
Platelets: 334 10*3/uL (ref 150–400)
RBC: 4.79 MIL/uL (ref 4.22–5.81)
RDW: 14.3 % (ref 11.5–15.5)
WBC: 26.6 10*3/uL — ABNORMAL HIGH (ref 4.0–10.5)

## 2014-11-11 LAB — I-STAT TROPONIN, ED: Troponin i, poc: 0 ng/mL (ref 0.00–0.08)

## 2014-11-11 MED ORDER — IPRATROPIUM-ALBUTEROL 0.5-2.5 (3) MG/3ML IN SOLN
3.0000 mL | Freq: Once | RESPIRATORY_TRACT | Status: AC
  Administered 2014-11-11: 3 mL via RESPIRATORY_TRACT
  Filled 2014-11-11: qty 3

## 2014-11-11 MED ORDER — TIOTROPIUM BROMIDE MONOHYDRATE 18 MCG IN CAPS
18.0000 ug | ORAL_CAPSULE | Freq: Every day | RESPIRATORY_TRACT | Status: AC
Start: 2014-11-11 — End: ?

## 2014-11-11 MED ORDER — AZITHROMYCIN 250 MG PO TABS
500.0000 mg | ORAL_TABLET | Freq: Once | ORAL | Status: AC
  Administered 2014-11-11: 500 mg via ORAL
  Filled 2014-11-11: qty 2

## 2014-11-11 MED ORDER — IOHEXOL 350 MG/ML SOLN
100.0000 mL | Freq: Once | INTRAVENOUS | Status: AC | PRN
  Administered 2014-11-11: 100 mL via INTRAVENOUS

## 2014-11-11 MED ORDER — METHYLPREDNISOLONE SODIUM SUCC 125 MG IJ SOLR
125.0000 mg | Freq: Once | INTRAMUSCULAR | Status: AC
  Administered 2014-11-11: 125 mg via INTRAVENOUS
  Filled 2014-11-11: qty 2

## 2014-11-11 MED ORDER — AZITHROMYCIN 250 MG PO TABS: ORAL_TABLET | ORAL | Status: AC

## 2014-11-11 MED ORDER — PREDNISONE 20 MG PO TABS: ORAL_TABLET | ORAL | Status: AC

## 2014-11-11 MED ORDER — MOMETASONE FURO-FORMOTEROL FUM 200-5 MCG/ACT IN AERO: 2.0000 | INHALATION_SPRAY | Freq: Two times a day (BID) | RESPIRATORY_TRACT | Status: AC

## 2014-11-11 NOTE — ED Notes (Signed)
Patient transported to CT 

## 2014-11-11 NOTE — ED Notes (Signed)
Pt states Friday started having left back/shoulder pain. Then started having increased shob.  Pt states that he is out of one of his inhalers and doesn't have any refills.  Pt has COPD and on 2L O2 continuous.
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
## 2014-11-11 NOTE — Discharge Instructions (Signed)
Pneumonia °Pneumonia is an infection of the lungs.  °CAUSES °Pneumonia may be caused by bacteria or a virus. Usually, these infections are caused by breathing infectious particles into the lungs (respiratory tract). °SIGNS AND SYMPTOMS  °· Cough. °· Fever. °· Chest pain. °· Increased rate of breathing. °· Wheezing. °· Mucus production. °DIAGNOSIS  °If you have the common symptoms of pneumonia, your health care provider will typically confirm the diagnosis with a chest X-ray. The X-ray will show an abnormality in the lung (pulmonary infiltrate) if you have pneumonia. Other tests of your blood, urine, or sputum may be done to find the specific cause of your pneumonia. Your health care provider may also do tests (blood gases or pulse oximetry) to see how well your lungs are working. °TREATMENT  °Some forms of pneumonia may be spread to other people when you cough or sneeze. You may be asked to wear a mask before and during your exam. Pneumonia that is caused by bacteria is treated with antibiotic medicine. Pneumonia that is caused by the influenza virus may be treated with an antiviral medicine. Most other viral infections must run their course. These infections will not respond to antibiotics.  °HOME CARE INSTRUCTIONS  °· Cough suppressants may be used if you are losing too much rest. However, coughing protects you by clearing your lungs. You should avoid using cough suppressants if you can. °· Your health care provider may have prescribed medicine if he or she thinks your pneumonia is caused by bacteria or influenza. Finish your medicine even if you start to feel better. °· Your health care provider may also prescribe an expectorant. This loosens the mucus to be coughed up. °· Take medicines only as directed by your health care provider. °· Do not smoke. Smoking is a common cause of bronchitis and can contribute to pneumonia. If you are a smoker and continue to smoke, your cough may last several weeks after your  pneumonia has cleared. °· A cold steam vaporizer or humidifier in your room or home may help loosen mucus. °· Coughing is often worse at night. Sleeping in a semi-upright position in a recliner or using a couple pillows under your head will help with this. °· Get rest as you feel it is needed. Your body will usually let you know when you need to rest. °PREVENTION °A pneumococcal shot (vaccine) is available to prevent a common bacterial cause of pneumonia. This is usually suggested for: °· People over 65 years old. °· Patients on chemotherapy. °· People with chronic lung problems, such as bronchitis or emphysema. °· People with immune system problems. °If you are over 65 or have a high risk condition, you may receive the pneumococcal vaccine if you have not received it before. In some countries, a routine influenza vaccine is also recommended. This vaccine can help prevent some cases of pneumonia. You may be offered the influenza vaccine as part of your care. °If you smoke, it is time to quit. You may receive instructions on how to stop smoking. Your health care provider can provide medicines and counseling to help you quit. °SEEK MEDICAL CARE IF: °You have a fever. °SEEK IMMEDIATE MEDICAL CARE IF:  °· Your illness becomes worse. This is especially true if you are elderly or weakened from any other disease. °· You cannot control your cough with suppressants and are losing sleep. °· You begin coughing up blood. °· You develop pain which is getting worse or is uncontrolled with medicines. °· Any of the symptoms   which initially brought you in for treatment are getting worse rather than better. °· You develop shortness of breath or chest pain. °MAKE SURE YOU:  °· Understand these instructions. °· Will watch your condition. °· Will get help right away if you are not doing well or get worse. °Document Released: 07/20/2005 Document Revised: 12/04/2013 Document Reviewed: 10/09/2010 °[employer]® Patient Information ©2015  [employer]. This information is not intended to replace advice given to you by your health care provider. Make sure you discuss any questions you have with your health care provider. ° ° °Chronic Obstructive Pulmonary Disease °Chronic obstructive pulmonary disease (COPD) is a common lung condition in which airflow from the lungs is limited. COPD is a general term that can be used to describe many different lung problems that limit airflow, including both chronic bronchitis and emphysema.  If you have COPD, your lung function will probably never return to normal, but there are measures you can take to improve lung function and make yourself feel better.  °CAUSES  °· Smoking (common).   °· Exposure to secondhand smoke.   °· Genetic problems. °· Chronic inflammatory lung diseases or recurrent infections. °SYMPTOMS  °· Shortness of breath, especially with physical activity.   °· Deep, persistent (chronic) cough with a large amount of thick mucus.   °· Wheezing.   °· Rapid breaths (tachypnea).   °· Gray or bluish discoloration (cyanosis) of the skin, especially in fingers, toes, or lips.   °· Fatigue.   °· Weight loss.   °· Frequent infections or episodes when breathing symptoms become much worse (exacerbations).   °· Chest tightness. °DIAGNOSIS  °Your health care provider will take a medical history and perform a physical examination to make the initial diagnosis.  Additional tests for COPD may include:  °· Lung (pulmonary) function tests. °· Chest X-ray. °· CT scan. °· Blood tests. °TREATMENT  °Treatment available to help you feel better when you have COPD includes:  °· Inhaler and nebulizer medicines. These help manage the symptoms of COPD and make your breathing more comfortable. °· Supplemental oxygen. Supplemental oxygen is only helpful if you have a low oxygen level in your blood.   °· Exercise and physical activity. These are beneficial for nearly all people with COPD. Some people may also benefit from a  pulmonary rehabilitation program. °HOME CARE INSTRUCTIONS  °· Take all medicines (inhaled or pills) as directed by your health care provider. °· Avoid over-the-counter medicines or cough syrups that dry up your airway (such as antihistamines) and slow down the elimination of secretions unless instructed otherwise by your health care provider.   °· If you are a smoker, the most important thing that you can do is stop smoking. Continuing to smoke will cause further lung damage and breathing trouble. Ask your health care provider for help with quitting smoking. He or she can direct you to community resources or hospitals that provide support. °· Avoid exposure to irritants such as smoke, chemicals, and fumes that aggravate your breathing. °· Use oxygen therapy and pulmonary rehabilitation if directed by your health care provider. If you require home oxygen therapy, ask your health care provider whether you should purchase a pulse oximeter to measure your oxygen level at home.   °· Avoid contact with individuals who have a contagious illness. °· Avoid extreme temperature and humidity changes. °· Eat healthy foods. Eating smaller, more frequent meals and resting before meals may help you maintain your strength. °· Stay active, but balance activity with periods of rest. Exercise and physical activity will help you maintain your ability to do   things you want to do. °· Preventing infection and hospitalization is very important when you have COPD. Make sure to receive all the vaccines your health care provider recommends, especially the pneumococcal and influenza vaccines. Ask your health care provider whether you need a pneumonia vaccine. °· Learn and use relaxation techniques to manage stress. °· Learn and use controlled breathing techniques as directed by your health care provider. Controlled breathing techniques include:   °¨ Pursed lip breathing. Start by breathing in (inhaling) through your nose for 1 second. Then,  purse your lips as if you were going to whistle and breathe out (exhale) through the pursed lips for 2 seconds.   °¨ Diaphragmatic breathing. Start by putting one hand on your abdomen just above your waist. Inhale slowly through your nose. The hand on your abdomen should move out. Then purse your lips and exhale slowly. You should be able to feel the hand on your abdomen moving in as you exhale.   °· Learn and use controlled coughing to clear mucus from your lungs. Controlled coughing is a series of short, progressive coughs. The steps of controlled coughing are:   °1. Lean your head slightly forward.   °2. Breathe in deeply using diaphragmatic breathing.   °3. Try to hold your breath for 3 seconds.   °4. Keep your mouth slightly open while coughing twice.   °5. Spit any mucus out into a tissue.   °6. Rest and repeat the steps once or twice as needed. °SEEK MEDICAL CARE IF:  °· You are coughing up more mucus than usual.   °· There is a change in the color or thickness of your mucus.   °· Your breathing is more labored than usual.   °· Your breathing is faster than usual.   °SEEK IMMEDIATE MEDICAL CARE IF:  °· You have shortness of breath while you are resting.   °· You have shortness of breath that prevents you from: °¨ Being able to talk.   °¨ Performing your usual physical activities.   °· You have chest pain lasting longer than 5 minutes.   °· Your skin color is more cyanotic than usual. °· You measure low oxygen saturations for longer than 5 minutes with a pulse oximeter. °MAKE SURE YOU:  °· Understand these instructions. °· Will watch your condition. °· Will get help right away if you are not doing well or get worse. °Document Released: 04/29/2005 Document Revised: 12/04/2013 Document Reviewed: 03/16/2013 °[employer]® Patient Information ©2015 [employer]. This information is not intended to replace advice given to you by your health care provider. Make sure you discuss any questions you have with your health  care provider. ° °

## 2014-11-12 ENCOUNTER — Other Ambulatory Visit (HOSPITAL_COMMUNITY): Payer: Self-pay | Admitting: Interventional Radiology

## 2014-11-12 ENCOUNTER — Other Ambulatory Visit: Payer: Self-pay | Admitting: Emergency Medicine

## 2014-11-12 ENCOUNTER — Telehealth (HOSPITAL_COMMUNITY): Payer: Self-pay | Admitting: Radiology

## 2014-11-12 DIAGNOSIS — T85518A Breakdown (mechanical) of other gastrointestinal prosthetic devices, implants and grafts, initial encounter: Secondary | ICD-10-CM

## 2014-11-12 NOTE — Telephone Encounter (Signed)
Returned patient's phone call from earlier today stating that cholecystostomy had fallen out.  Dr. Deniece Portela recommendation is to come in on 11/13/14 to attempt to recanalize the tract to replace the catheter.  Patient instructed to be NPO and arrive at 10:00 am to [facility].

## 2014-11-13 ENCOUNTER — Ambulatory Visit (HOSPITAL_COMMUNITY)
Admission: RE | Admit: 2014-11-13 | Discharge: 2014-11-13 | Disposition: A | Discharge status: Home / Self Care | Payer: Medicare Other | Source: Ambulatory Visit | Attending: Interventional Radiology | Admitting: Interventional Radiology

## 2014-11-13 DIAGNOSIS — T85518A Breakdown (mechanical) of other gastrointestinal prosthetic devices, implants and grafts, initial encounter: Secondary | ICD-10-CM
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
## 2014-11-19 ENCOUNTER — Encounter (HOSPITAL_COMMUNITY): Payer: Self-pay | Admitting: Emergency Medicine

## 2014-11-19 ENCOUNTER — Emergency Department (HOSPITAL_COMMUNITY)
Admission: EM | Admit: 2014-11-19 | Discharge: 2014-11-19 | Disposition: A | Discharge status: Home / Self Care | Payer: Medicare Other | Attending: Emergency Medicine | Admitting: Emergency Medicine

## 2014-11-19 ENCOUNTER — Emergency Department (HOSPITAL_COMMUNITY): Payer: Medicare Other

## 2014-11-19 DIAGNOSIS — Z862 Personal history of diseases of the blood and blood-forming organs and certain disorders involving the immune mechanism: Secondary | ICD-10-CM | POA: Insufficient documentation

## 2014-11-19 DIAGNOSIS — J449 Chronic obstructive pulmonary disease, unspecified: Secondary | ICD-10-CM | POA: Diagnosis not present

## 2014-11-19 DIAGNOSIS — R109 Unspecified abdominal pain: Secondary | ICD-10-CM

## 2014-11-19 DIAGNOSIS — R1011 Right upper quadrant pain: Secondary | ICD-10-CM | POA: Diagnosis not present

## 2014-11-19 DIAGNOSIS — Z8719 Personal history of other diseases of the digestive system: Secondary | ICD-10-CM | POA: Diagnosis not present

## 2014-11-19 DIAGNOSIS — G8929 Other chronic pain: Secondary | ICD-10-CM | POA: Diagnosis not present

## 2014-11-19 DIAGNOSIS — Z8701 Personal history of pneumonia (recurrent): Secondary | ICD-10-CM | POA: Insufficient documentation

## 2014-11-19 DIAGNOSIS — Z7952 Long term (current) use of systemic steroids: Secondary | ICD-10-CM | POA: Diagnosis not present

## 2014-11-19 DIAGNOSIS — K219 Gastro-esophageal reflux disease without esophagitis: Secondary | ICD-10-CM | POA: Diagnosis not present

## 2014-11-19 LAB — CBC WITH DIFFERENTIAL/PLATELET
Basophils Absolute: 0 10*3/uL (ref 0.0–0.1)
Basophils Relative: 0 % (ref 0–1)
EOS PCT: 1 % (ref 0–5)
Eosinophils Absolute: 0.2 10*3/uL (ref 0.0–0.7)
HEMATOCRIT: 46.4 % (ref 39.0–52.0)
HEMOGLOBIN: 16.4 g/dL (ref 13.0–17.0)
LYMPHS ABS: 5.6 10*3/uL — AB (ref 0.7–4.0)
LYMPHS PCT: 27 % (ref 12–46)
MCH: 32.9 pg (ref 26.0–34.0)
MCHC: 35.3 g/dL (ref 30.0–36.0)
MCV: 93 fL (ref 78.0–100.0)
MONOS PCT: 7 % (ref 3–12)
Monocytes Absolute: 1.4 10*3/uL — ABNORMAL HIGH (ref 0.1–1.0)
NEUTROS PCT: 65 % (ref 43–77)
Neutro Abs: 13.7 10*3/uL — ABNORMAL HIGH (ref 1.7–7.7)
PLATELETS: 398 10*3/uL (ref 150–400)
RBC: 4.99 MIL/uL (ref 4.22–5.81)
RDW: 14.5 % (ref 11.5–15.5)
WBC: 20.9 10*3/uL — ABNORMAL HIGH (ref 4.0–10.5)

## 2014-11-19 LAB — COMPREHENSIVE METABOLIC PANEL
ALBUMIN: 3.9 g/dL (ref 3.5–5.2)
ALT: 44 U/L (ref 0–53)
AST: 37 U/L (ref 0–37)
Alkaline Phosphatase: 81 U/L (ref 39–117)
Anion gap: 10 (ref 5–15)
BILIRUBIN TOTAL: 1.1 mg/dL (ref 0.3–1.2)
BUN: 25 mg/dL — ABNORMAL HIGH (ref 6–23)
CO2: 26 mmol/L (ref 19–32)
Calcium: 9.3 mg/dL (ref 8.4–10.5)
Chloride: 97 mmol/L (ref 96–112)
Creatinine, Ser: 1.12 mg/dL (ref 0.50–1.35)
GFR calc Af Amer: 79 mL/min — ABNORMAL LOW (ref >90)
GFR calc non Af Amer: 68 mL/min — ABNORMAL LOW (ref >90)
GLUCOSE: 102 mg/dL — AB (ref 70–99)
POTASSIUM: 5.5 mmol/L — AB (ref 3.5–5.1)
SODIUM: 133 mmol/L — AB (ref 135–145)
Total Protein: 7.1 g/dL (ref 6.0–8.3)

## 2014-11-19 LAB — URINALYSIS, ROUTINE W REFLEX MICROSCOPIC
Bilirubin Urine: NEGATIVE
Glucose, UA: NEGATIVE mg/dL
Hgb urine dipstick: NEGATIVE
Ketones, ur: NEGATIVE mg/dL
Leukocytes, UA: NEGATIVE
Nitrite: NEGATIVE
Protein, ur: NEGATIVE mg/dL
Specific Gravity, Urine: 1.025 (ref 1.005–1.030)
Urobilinogen, UA: 0.2 mg/dL (ref 0.0–1.0)
pH: 5 (ref 5.0–8.0)

## 2014-11-19 LAB — BASIC METABOLIC PANEL
Anion gap: 6 (ref 5–15)
BUN: 24 mg/dL — ABNORMAL HIGH (ref 6–23)
CO2: 28 mmol/L (ref 19–32)
CREATININE: 0.94 mg/dL (ref 0.50–1.35)
Calcium: 9.1 mg/dL (ref 8.4–10.5)
Chloride: 99 mmol/L (ref 96–112)
GFR, EST NON AFRICAN AMERICAN: 87 mL/min — AB (ref >90)
Glucose, Bld: 105 mg/dL — ABNORMAL HIGH (ref 70–99)
Potassium: 4.1 mmol/L (ref 3.5–5.1)
SODIUM: 133 mmol/L — AB (ref 135–145)

## 2014-11-19 LAB — LIPASE, BLOOD: LIPASE: 20 U/L (ref 11–59)

## 2014-11-19 LAB — I-STAT TROPONIN, ED: Troponin i, poc: 0 ng/mL (ref 0.00–0.08)

## 2014-11-19 LAB — LACTIC ACID, PLASMA: LACTIC ACID, VENOUS: 1.2 mmol/L (ref 0.5–2.0)

## 2014-11-19 MED ORDER — IOHEXOL 300 MG/ML  SOLN
50.0000 mL | Freq: Once | INTRAMUSCULAR | Status: AC | PRN
  Administered 2014-11-19: 50 mL via ORAL

## 2014-11-19 MED ORDER — HYDROMORPHONE HCL 1 MG/ML IJ SOLN
1.0000 mg | Freq: Once | INTRAMUSCULAR | Status: AC
  Administered 2014-11-19: 1 mg via INTRAVENOUS
  Filled 2014-11-19: qty 1

## 2014-11-19 MED ORDER — IOHEXOL 300 MG/ML  SOLN
100.0000 mL | Freq: Once | INTRAMUSCULAR | Status: AC | PRN
  Administered 2014-11-19: 100 mL via INTRAVENOUS

## 2014-11-19 MED ORDER — ONDANSETRON HCL 4 MG/2ML IJ SOLN
4.0000 mg | Freq: Once | INTRAMUSCULAR | Status: AC
  Administered 2014-11-19: 4 mg via INTRAVENOUS
  Filled 2014-11-19: qty 2

## 2014-11-19 MED ORDER — SODIUM CHLORIDE 0.9 % IV BOLUS (SEPSIS)
1000.0000 mL | Freq: Once | INTRAVENOUS | Status: AC
  Administered 2014-11-19: 1000 mL via INTRAVENOUS

## 2014-11-19 NOTE — ED Notes (Signed)
Ultrasound at bedside

## 2014-11-19 NOTE — Discharge Instructions (Signed)
Abdominal Pain  Many things can cause abdominal pain. Usually, abdominal pain is not caused by a disease and will improve without treatment. It can often be observed and treated at home. Your health care provider will do a physical exam and possibly order blood tests and X-rays to help determine the seriousness of your pain. However, in many cases, more time must pass before a clear cause of the pain can be found. Before that point, your health care provider may not know if you need more testing or further treatment.  HOME CARE INSTRUCTIONS   Monitor your abdominal pain for any changes. The following actions may help to alleviate any discomfort you are experiencing:   Only take over-the-counter or prescription medicines as directed by your health care provider.   Do not take laxatives unless directed to do so by your health care provider.   Try a clear liquid diet (broth, tea, or water) as directed by your health care provider. Slowly move to a bland diet as tolerated.  SEEK MEDICAL CARE IF:   You have unexplained abdominal pain.   You have abdominal pain associated with nausea or diarrhea.   You have pain when you urinate or have a bowel movement.   You experience abdominal pain that wakes you in the night.   You have abdominal pain that is worsened or improved by eating food.   You have abdominal pain that is worsened with eating fatty foods.   You have a fever.  SEEK IMMEDIATE MEDICAL CARE IF:    Your pain does not go away within 2 hours.   You keep throwing up (vomiting).   Your pain is felt only in portions of the abdomen, such as the right side or the left lower portion of the abdomen.   You pass bloody or black tarry stools.  MAKE SURE YOU:   Understand these instructions.    Will watch your condition.    Will get help right away if you are not doing well or get worse.   Document Released: 04/29/2005 Document Revised: 07/25/2013 Document Reviewed: 03/29/2013  [employer] Patient Information  2015 [employer]. This information is not intended to replace advice given to you by your health care provider. Make sure you discuss any questions you have with your health care provider.          Biliary Colic   Biliary colic is a steady or irregular pain in the upper abdomen. It is usually under the right side of the rib cage. It happens when gallstones interfere with the normal flow of bile from the gallbladder. Bile is a liquid that helps to digest fats. Bile is made in the liver and stored in the gallbladder. When you eat a meal, bile passes from the gallbladder through the cystic duct and the common bile duct into the small intestine. There, it mixes with partially digested food. If a gallstone blocks either of these ducts, the normal flow of bile is blocked. The muscle cells in the bile duct contract forcefully to try to move the stone. This causes the pain of biliary colic.   SYMPTOMS    A person with biliary colic usually complains of pain in the upper abdomen. This pain can be:   In the center of the upper abdomen just below the breastbone.   In the upper-right part of the abdomen, near the gallbladder and liver.   Spread back toward the right shoulder blade.   Nausea and vomiting.   The pain   usually occurs after eating.   Biliary colic is usually triggered by the digestive system's demand for bile. The demand for bile is high after fatty meals. Symptoms can also occur when a person who has been fasting suddenly eats a very large meal. Most episodes of biliary colic pass after 1 to 5 hours. After the most intense pain passes, your abdomen may continue to ache mildly for about 24 hours.  DIAGNOSIS   After you describe your symptoms, your caregiver will perform a physical exam. He or she will pay attention to the upper right portion of your belly (abdomen). This is the area of your liver and gallbladder. An ultrasound will help your caregiver look for gallstones. Specialized scans of the  gallbladder may also be done. Blood tests may be done, especially if you have fever or if your pain persists.  PREVENTION   Biliary colic can be prevented by controlling the risk factors for gallstones. Some of these risk factors, such as heredity, increasing age, and pregnancy are a normal part of life. Obesity and a high-fat diet are risk factors you can change through a healthy lifestyle. Women going through menopause who take hormone replacement therapy (estrogen) are also more likely to develop biliary colic.  TREATMENT    Pain medication may be prescribed.   You may be encouraged to eat a fat-free diet.   If the first episode of biliary colic is severe, or episodes of colic keep retuning, surgery to remove the gallbladder (cholecystectomy) is usually recommended. This procedure can be done through small incisions using an instrument called a laparoscope. The procedure often requires a brief stay in the hospital. Some people can leave the hospital the same day. It is the most widely used treatment in people troubled by painful gallstones. It is effective and safe, with no complications in more than 90% of cases.   If surgery cannot be done, medication that dissolves gallstones may be used. This medication is expensive and can take months or years to work. Only small stones will dissolve.   Rarely, medication to dissolve gallstones is combined with a procedure called shock-wave lithotripsy. This procedure uses carefully aimed shock waves to break up gallstones. In many people treated with this procedure, gallstones form again within a few years.  PROGNOSIS   If gallstones block your cystic duct or common bile duct, you are at risk for repeated episodes of biliary colic. There is also a 25% chance that you will develop a gallbladder infection(acute cholecystitis), or some other complication of gallstones within 10 to 20 years. If you have surgery, schedule it at a time that is convenient for you and at a  time when you are not sick.  HOME CARE INSTRUCTIONS    Drink plenty of clear fluids.   Avoid fatty, greasy or fried foods, or any foods that make your pain worse.   Take medications as directed.  SEEK MEDICAL CARE IF:    You develop a fever over 100.5 F (38.1 C).   Your pain gets worse over time.   You develop nausea that prevents you from eating and drinking.   You develop vomiting.  SEEK IMMEDIATE MEDICAL CARE IF:    You have continuous or severe belly (abdominal) pain which is not relieved with medications.   You develop nausea and vomiting which is not relieved with medications.   You have symptoms of biliary colic and you suddenly develop a fever and shaking chills. This may signal cholecystitis. Call your caregiver   immediately.   You develop a yellow color to your skin or the white part of your eyes (jaundice).  Document Released: 12/21/2005 Document Revised: 10/12/2011 Document Reviewed: 03/01/2008  [employer] Patient Information 2015 [employer]. This information is not intended to replace advice given to you by your health care provider. Make sure you discuss any questions you have with your health care provider.

## 2014-11-19 NOTE — ED Notes (Signed)
Pt states he is having upper abd pain that started tonight about 1 am  Pt states he is having pressure in his upper abdomen that is making it hard for him to breathe  Pt states he has gallbladder problems  Pt states he had one episode of vomiting tonight  Pt states he had a drain to his gallbladder that came out the first of the week  Pt states he has been here since then

## 2014-11-19 NOTE — ED Provider Notes (Signed)
CSN: [id number]     Arrival date & time 11/19/14  3329 History   First MD Initiated Contact with Patient 11/19/14 [phone]     Chief Complaint  Patient presents with  . Abdominal Pain     (Consider location/radiation/quality/duration/timing/severity/associated sxs/prior Treatment) Patient is a 64 y.o. male presenting with abdominal pain.  Abdominal Pain Pain location:  RUQ Pain quality: sharp   Pain radiates to:  Does not radiate Pain severity:  Severe Onset quality:  Gradual Duration:  4 hours Timing:  Constant Progression:  Unchanged Chronicity:  Recurrent Context comment:  Recent percutaneous biliary drain accidental removal with subsequent unremarkable visit Relieved by:  Nothing Worsened by:  Movement and palpation Ineffective treatments:  None tried Associated symptoms: nausea   Associated symptoms: no anorexia, no constipation, no diarrhea, no dysuria, no fever and no vomiting     Past Medical History  Diagnosis Date  . COPD (chronic obstructive pulmonary disease)     Probable - long-standing tobacco abuse  . Chronic pain     Bilateral knees and shoulders  . GERD (gastroesophageal reflux disease)   . 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 [name]  . Pneumonia     January 2015  . Hepatitis     Hapatitis B 40 yrs ago   Past Surgical History  Procedure Laterality Date  . Right total knee arthroplasty  2003  . Left total knee arthroplasty  1998    "left knee replacement is broken" - 08/09/13   Family History  Problem Relation Age of Onset  . Diabetes type II Father   . Coronary artery disease Father     MI in his 17s  . Diabetes Mother   . Diabetes Brother   . Diabetes Sister   . Asthma Sister    History  Substance Use Topics  . Smoking status: Current Every Day Smoker -- 2.00 packs/day for 50 years    Types: Cigarettes    Start date: 08/03/1962  . Smokeless tobacco: Never Used  . 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  Gastrointestinal: Positive for nausea and abdominal pain. Negative for vomiting, diarrhea, constipation and anorexia.  Genitourinary: Negative for dysuria.  All other systems reviewed and are negative.     Allergies  Review of patient's allergies indicates no known allergies.  Home Medications   Prior to Admission medications   Medication Sig Start Date End Date Taking? Authorizing Provider  albuterol (VENTOLIN HFA) 108 (90 BASE) MCG/ACT inhaler Inhale 2 puffs into the lungs every 4 (four) hours as needed for wheezing or shortness of breath. 10/09/14  Yes Collene Gobble, MD  benzonatate (TESSALON) 200 MG capsule Take 200 mg by mouth 3 (three) times daily as needed for cough.   Yes Historical Provider, MD  mometasone-formoterol (DULERA) 200-5 MCG/ACT AERO Inhale 2 puffs into the lungs 2 (two) times daily. 11/11/14  Yes Ernestina Patches, MD  predniSONE (DELTASONE) 20 MG tablet 3 tabs po daily x 3 days, then 2 tabs x 3 days, then 1.5 tabs x 3 days, then 1 tab x 3 days, then 0.5 tabs x 3 days 11/11/14  Yes Ernestina Patches, MD  tiotropium (SPIRIVA HANDIHALER) 18 MCG inhalation capsule Place 1 capsule (18 mcg total) into inhaler and inhale daily. 11/11/14  Yes Ernestina Patches, MD  albuterol (PROVENTIL) (2.5 MG/3ML) 0.083% nebulizer solution Take 3 mLs (2.5  mg total) by nebulization every 6 (six) hours as needed for wheezing or shortness of breath. Patient not taking: Reported on 11/11/2014 09/18/14   Caren Griffins, MD  azithromycin (ZITHROMAX) 250 MG tablet 250mg  daily starting tomorrow (11/11/14), for 4 day Patient not taking: Reported on 11/19/2014 11/11/14   Ernestina Patches, MD  chlorpheniramine (CHLOR-TRIMETON) 4 MG tablet Take 1 tablet (4 mg total) by mouth every 6 (six) hours as needed for allergies. Patient not taking: Reported on 09/16/2014 04/18/14   Collene Gobble, MD  DULERA 200-5 MCG/ACT AERO INHALE 2 PUFFS INTO THE LUNGS 2 (TWO)  TIMES DAILY. 11/12/14   Collene Gobble, MD  fluticasone (FLONASE) 50 MCG/ACT nasal spray PLACE 2 SPRAYS INTO THE NOSE 2 (TWO) TIMES DAILY. Patient not taking: Reported on 09/16/2014 07/24/14   Collene Gobble, MD  nicotine (NICODERM CQ - DOSED IN MG/24 HOURS) 21 mg/24hr patch Place 1 patch (21 mg total) onto the skin daily. Patient not taking: Reported on 11/11/2014 09/18/14   Caren Griffins, MD  PRESCRIPTION MEDICATION Take 5 mLs by mouth every 4 (four) hours as needed (cough). Cough medication    Historical Provider, MD  SPIRIVA HANDIHALER 18 MCG inhalation capsule USE 1 PUFF DAILY 11/21/14   Collene Gobble, MD   BP 124/63 mmHg  Pulse 88  Temp(Src) 97.7 F (36.5 C) (Oral)  Resp 12  Ht 5\' 11"  (1.803 m)  Wt 250 lb (113.399 kg)  BMI 34.88 kg/m2  SpO2 100% Physical Exam  Constitutional: He is oriented to person, place, and time. He appears well-developed and well-nourished.  HENT:  Head: Normocephalic and atraumatic.  Eyes: Conjunctivae and EOM are normal.  Neck: Normal range of motion. Neck supple.  Cardiovascular: Normal rate, regular rhythm and normal heart sounds.   Pulmonary/Chest: Effort normal and breath sounds normal. No respiratory distress.  Abdominal: He exhibits no distension. There is tenderness in the right upper quadrant. There is no rebound and no guarding.  Musculoskeletal: Normal range of motion.  Neurological: He is alert and oriented to person, place, and time.  Skin: Skin is warm and dry.  Vitals reviewed.   ED Course  Procedures (including critical care time) Labs Review Labs Reviewed  COMPREHENSIVE METABOLIC PANEL - Abnormal; Notable for the following:    Sodium 133 (*)    Potassium 5.5 (*)    Glucose, Bld 102 (*)    BUN 25 (*)    GFR calc non Af Amer 68 (*)    GFR calc Af Amer 79 (*)    All other components within normal limits  CBC WITH DIFFERENTIAL/PLATELET - Abnormal; Notable for the following:    WBC 20.9 (*)    Neutro Abs 13.7 (*)    Lymphs Abs  5.6 (*)    Monocytes Absolute 1.4 (*)    All other components within normal limits  BASIC METABOLIC PANEL - Abnormal; Notable for the following:    Sodium 133 (*)    Glucose, Bld 105 (*)    BUN 24 (*)    GFR calc non Af Amer 87 (*)    All other components within normal limits  LIPASE, BLOOD  LACTIC ACID, PLASMA  URINALYSIS, ROUTINE W REFLEX MICROSCOPIC  I-STAT TROPOININ, ED    Imaging Review No results found.   EKG Interpretation   Date/Time:  Monday November 19 2014 04:24:28 EDT Ventricular Rate:  87 PR Interval:  157 QRS Duration: 80 QT Interval:  390 QTC Calculation: 469 R Axis:   73  Text Interpretation:  Sinus rhythm Abnormal R-wave progression, early  transition Baseline wander in lead(s) V4 No significant change since last  tracing Confirmed by Debby Freiberg [phone]) on 11/19/2014 5:19:02 AM      MDM   Final diagnoses:  Abdominal pain, acute    64 y.o. male with pertinent PMH of COPD (with polycythemia), cholecystitis with prior percutaneous drainage presents with recurrent RUQ abd pain.  On arrival vitals and physical exam as above.    Wu essentially unremarkable with exception of elevated K.  Suspect this was due to hemolysis. Repeated and unremarkable. Pain relieved. DC home in stable condition.   I have reviewed all laboratory and imaging studies if ordered as above  1. Abdominal pain, acute         Debby Freiberg, MD 11/22/14 [phone]

## 2014-11-19 NOTE — ED Notes (Signed)
Patient was educated not to drive, operate heavy machinery, or drink alcohol while taking narcotic medication.  

## 2014-11-21 ENCOUNTER — Other Ambulatory Visit: Payer: Self-pay | Admitting: Emergency Medicine

## 2014-11-26 ENCOUNTER — Inpatient Hospital Stay (HOSPITAL_COMMUNITY)
Admission: EM | Admit: 2014-11-26 | Discharge: 2015-01-02 | DRG: 853 | Disposition: E | Payer: Medicare Other | Attending: Internal Medicine | Admitting: Internal Medicine

## 2014-11-26 ENCOUNTER — Emergency Department (HOSPITAL_COMMUNITY): Payer: Medicare Other

## 2014-11-26 ENCOUNTER — Encounter (HOSPITAL_COMMUNITY): Payer: Self-pay | Admitting: Emergency Medicine

## 2014-11-26 DIAGNOSIS — Z79899 Other long term (current) drug therapy: Secondary | ICD-10-CM

## 2014-11-26 DIAGNOSIS — I4901 Ventricular fibrillation: Secondary | ICD-10-CM | POA: Diagnosis not present

## 2014-11-26 DIAGNOSIS — Z4682 Encounter for fitting and adjustment of non-vascular catheter: Secondary | ICD-10-CM

## 2014-11-26 DIAGNOSIS — F17200 Nicotine dependence, unspecified, uncomplicated: Secondary | ICD-10-CM | POA: Diagnosis present

## 2014-11-26 DIAGNOSIS — R0602 Shortness of breath: Secondary | ICD-10-CM

## 2014-11-26 DIAGNOSIS — N179 Acute kidney failure, unspecified: Secondary | ICD-10-CM | POA: Diagnosis present

## 2014-11-26 DIAGNOSIS — R7689 Other specified abnormal immunological findings in serum: Secondary | ICD-10-CM | POA: Diagnosis present

## 2014-11-26 DIAGNOSIS — R6521 Severe sepsis with septic shock: Secondary | ICD-10-CM | POA: Diagnosis present

## 2014-11-26 DIAGNOSIS — J189 Pneumonia, unspecified organism: Secondary | ICD-10-CM | POA: Diagnosis present

## 2014-11-26 DIAGNOSIS — Z96653 Presence of artificial knee joint, bilateral: Secondary | ICD-10-CM | POA: Diagnosis present

## 2014-11-26 DIAGNOSIS — Z7952 Long term (current) use of systemic steroids: Secondary | ICD-10-CM

## 2014-11-26 DIAGNOSIS — N19 Unspecified kidney failure: Secondary | ICD-10-CM | POA: Diagnosis present

## 2014-11-26 DIAGNOSIS — T508X5A Adverse effect of diagnostic agents, initial encounter: Secondary | ICD-10-CM | POA: Diagnosis present

## 2014-11-26 DIAGNOSIS — F17210 Nicotine dependence, cigarettes, uncomplicated: Secondary | ICD-10-CM | POA: Diagnosis present

## 2014-11-26 DIAGNOSIS — E875 Hyperkalemia: Secondary | ICD-10-CM | POA: Diagnosis not present

## 2014-11-26 DIAGNOSIS — R0902 Hypoxemia: Secondary | ICD-10-CM

## 2014-11-26 DIAGNOSIS — K8012 Calculus of gallbladder with acute and chronic cholecystitis without obstruction: Secondary | ICD-10-CM | POA: Diagnosis present

## 2014-11-26 DIAGNOSIS — D751 Secondary polycythemia: Secondary | ICD-10-CM | POA: Diagnosis present

## 2014-11-26 DIAGNOSIS — Y95 Nosocomial condition: Secondary | ICD-10-CM | POA: Diagnosis present

## 2014-11-26 DIAGNOSIS — I4891 Unspecified atrial fibrillation: Secondary | ICD-10-CM | POA: Diagnosis present

## 2014-11-26 DIAGNOSIS — I4892 Unspecified atrial flutter: Secondary | ICD-10-CM | POA: Diagnosis present

## 2014-11-26 DIAGNOSIS — Z992 Dependence on renal dialysis: Secondary | ICD-10-CM

## 2014-11-26 DIAGNOSIS — J9620 Acute and chronic respiratory failure, unspecified whether with hypoxia or hypercapnia: Secondary | ICD-10-CM | POA: Diagnosis present

## 2014-11-26 DIAGNOSIS — J441 Chronic obstructive pulmonary disease with (acute) exacerbation: Secondary | ICD-10-CM | POA: Diagnosis present

## 2014-11-26 DIAGNOSIS — D649 Anemia, unspecified: Secondary | ICD-10-CM | POA: Diagnosis not present

## 2014-11-26 DIAGNOSIS — A414 Sepsis due to anaerobes: Secondary | ICD-10-CM | POA: Diagnosis present

## 2014-11-26 DIAGNOSIS — A4150 Gram-negative sepsis, unspecified: Secondary | ICD-10-CM | POA: Diagnosis not present

## 2014-11-26 DIAGNOSIS — E8809 Other disorders of plasma-protein metabolism, not elsewhere classified: Secondary | ICD-10-CM | POA: Diagnosis not present

## 2014-11-26 DIAGNOSIS — J9600 Acute respiratory failure, unspecified whether with hypoxia or hypercapnia: Secondary | ICD-10-CM

## 2014-11-26 DIAGNOSIS — R570 Cardiogenic shock: Secondary | ICD-10-CM | POA: Diagnosis present

## 2014-11-26 DIAGNOSIS — I959 Hypotension, unspecified: Secondary | ICD-10-CM

## 2014-11-26 DIAGNOSIS — Z720 Tobacco use: Secondary | ICD-10-CM | POA: Diagnosis present

## 2014-11-26 DIAGNOSIS — G4700 Insomnia, unspecified: Secondary | ICD-10-CM | POA: Diagnosis not present

## 2014-11-26 DIAGNOSIS — K810 Acute cholecystitis: Secondary | ICD-10-CM | POA: Diagnosis present

## 2014-11-26 DIAGNOSIS — R140 Abdominal distension (gaseous): Secondary | ICD-10-CM

## 2014-11-26 DIAGNOSIS — J9690 Respiratory failure, unspecified, unspecified whether with hypoxia or hypercapnia: Secondary | ICD-10-CM

## 2014-11-26 DIAGNOSIS — R609 Edema, unspecified: Secondary | ICD-10-CM

## 2014-11-26 DIAGNOSIS — I483 Typical atrial flutter: Secondary | ICD-10-CM | POA: Insufficient documentation

## 2014-11-26 DIAGNOSIS — K819 Cholecystitis, unspecified: Secondary | ICD-10-CM | POA: Diagnosis present

## 2014-11-26 DIAGNOSIS — J690 Pneumonitis due to inhalation of food and vomit: Secondary | ICD-10-CM

## 2014-11-26 DIAGNOSIS — K219 Gastro-esophageal reflux disease without esophagitis: Secondary | ICD-10-CM | POA: Diagnosis present

## 2014-11-26 DIAGNOSIS — J9601 Acute respiratory failure with hypoxia: Secondary | ICD-10-CM | POA: Diagnosis present

## 2014-11-26 DIAGNOSIS — J9621 Acute and chronic respiratory failure with hypoxia: Secondary | ICD-10-CM | POA: Diagnosis present

## 2014-11-26 DIAGNOSIS — K567 Ileus, unspecified: Secondary | ICD-10-CM | POA: Diagnosis present

## 2014-11-26 DIAGNOSIS — N170 Acute kidney failure with tubular necrosis: Secondary | ICD-10-CM | POA: Diagnosis present

## 2014-11-26 DIAGNOSIS — J449 Chronic obstructive pulmonary disease, unspecified: Secondary | ICD-10-CM | POA: Diagnosis present

## 2014-11-26 DIAGNOSIS — J9622 Acute and chronic respiratory failure with hypercapnia: Secondary | ICD-10-CM | POA: Diagnosis present

## 2014-11-26 DIAGNOSIS — I509 Heart failure, unspecified: Secondary | ICD-10-CM | POA: Diagnosis present

## 2014-11-26 DIAGNOSIS — G894 Chronic pain syndrome: Secondary | ICD-10-CM | POA: Diagnosis present

## 2014-11-26 DIAGNOSIS — Z7951 Long term (current) use of inhaled steroids: Secondary | ICD-10-CM

## 2014-11-26 DIAGNOSIS — R768 Other specified abnormal immunological findings in serum: Secondary | ICD-10-CM | POA: Diagnosis present

## 2014-11-26 DIAGNOSIS — B370 Candidal stomatitis: Secondary | ICD-10-CM | POA: Diagnosis present

## 2014-11-26 DIAGNOSIS — R627 Adult failure to thrive: Secondary | ICD-10-CM | POA: Diagnosis present

## 2014-11-26 DIAGNOSIS — D473 Essential (hemorrhagic) thrombocythemia: Secondary | ICD-10-CM | POA: Diagnosis present

## 2014-11-26 DIAGNOSIS — T814XXA Infection following a procedure, initial encounter: Secondary | ICD-10-CM | POA: Diagnosis present

## 2014-11-26 DIAGNOSIS — Z6838 Body mass index (BMI) 38.0-38.9, adult: Secondary | ICD-10-CM

## 2014-11-26 DIAGNOSIS — Z9981 Dependence on supplemental oxygen: Secondary | ICD-10-CM

## 2014-11-26 DIAGNOSIS — R109 Unspecified abdominal pain: Secondary | ICD-10-CM | POA: Diagnosis not present

## 2014-11-26 DIAGNOSIS — E874 Mixed disorder of acid-base balance: Secondary | ICD-10-CM | POA: Diagnosis not present

## 2014-11-26 DIAGNOSIS — A419 Sepsis, unspecified organism: Secondary | ICD-10-CM | POA: Diagnosis present

## 2014-11-26 LAB — CBC WITH DIFFERENTIAL/PLATELET
Basophils Absolute: 0.1 10*3/uL (ref 0.0–0.1)
Basophils Relative: 0 % (ref 0–1)
Eosinophils Absolute: 0.4 10*3/uL (ref 0.0–0.7)
Eosinophils Relative: 2 % (ref 0–5)
HEMATOCRIT: 44.9 % (ref 39.0–52.0)
Hemoglobin: 15.2 g/dL (ref 13.0–17.0)
Lymphocytes Relative: 11 % — ABNORMAL LOW (ref 12–46)
Lymphs Abs: 2.5 10*3/uL (ref 0.7–4.0)
MCH: 31.9 pg (ref 26.0–34.0)
MCHC: 33.9 g/dL (ref 30.0–36.0)
MCV: 94.3 fL (ref 78.0–100.0)
MONOS PCT: 7 % (ref 3–12)
Monocytes Absolute: 1.5 10*3/uL — ABNORMAL HIGH (ref 0.1–1.0)
NEUTROS ABS: 17.8 10*3/uL — AB (ref 1.7–7.7)
Neutrophils Relative %: 80 % — ABNORMAL HIGH (ref 43–77)
PLATELETS: 398 10*3/uL (ref 150–400)
RBC: 4.76 MIL/uL (ref 4.22–5.81)
RDW: 14.9 % (ref 11.5–15.5)
WBC: 22.3 10*3/uL — ABNORMAL HIGH (ref 4.0–10.5)

## 2014-11-26 LAB — COMPREHENSIVE METABOLIC PANEL
ALBUMIN: 3.7 g/dL (ref 3.5–5.2)
ALT: 21 U/L (ref 0–53)
AST: 18 U/L (ref 0–37)
Alkaline Phosphatase: 72 U/L (ref 39–117)
Anion gap: 9 (ref 5–15)
BUN: 22 mg/dL (ref 6–23)
CALCIUM: 9.1 mg/dL (ref 8.4–10.5)
CO2: 29 mmol/L (ref 19–32)
Chloride: 101 mmol/L (ref 96–112)
Creatinine, Ser: 1.01 mg/dL (ref 0.50–1.35)
GFR calc non Af Amer: 77 mL/min — ABNORMAL LOW (ref >90)
GFR, EST AFRICAN AMERICAN: 89 mL/min — AB (ref >90)
GLUCOSE: 114 mg/dL — AB (ref 70–99)
Potassium: 4.3 mmol/L (ref 3.5–5.1)
SODIUM: 139 mmol/L (ref 135–145)
Total Bilirubin: 0.4 mg/dL (ref 0.3–1.2)
Total Protein: 7.4 g/dL (ref 6.0–8.3)

## 2014-11-26 LAB — I-STAT CG4 LACTIC ACID, ED: LACTIC ACID, VENOUS: 1.48 mmol/L (ref 0.5–2.0)

## 2014-11-26 MED ORDER — SODIUM CHLORIDE 0.9 % IV BOLUS (SEPSIS)
1000.0000 mL | Freq: Once | INTRAVENOUS | Status: AC
  Administered 2014-11-26: 1000 mL via INTRAVENOUS

## 2014-11-26 MED ORDER — ALBUTEROL SULFATE (2.5 MG/3ML) 0.083% IN NEBU
5.0000 mg | INHALATION_SOLUTION | Freq: Once | RESPIRATORY_TRACT | Status: AC
  Administered 2014-11-26: 5 mg via RESPIRATORY_TRACT
  Filled 2014-11-26: qty 6

## 2014-11-26 MED ORDER — HYDROMORPHONE HCL 1 MG/ML IJ SOLN
1.0000 mg | Freq: Once | INTRAMUSCULAR | Status: AC
  Administered 2014-11-26: 1 mg via INTRAVENOUS
  Filled 2014-11-26: qty 1

## 2014-11-26 NOTE — ED Notes (Addendum)
Pt's wife states that pt had PNA and had a percutaneous drain that fell out- doctor decided not to put it back in. Pt is now having pain at the site where the drain was, SOB.

## 2014-11-26 NOTE — ED Notes (Signed)
Pt states his pain is coming back. O2 dropped down to 88. Explained to pt to make sure to take deep breathes through his nose. Pt was holding his breathe when in pain

## 2014-11-26 NOTE — ED Provider Notes (Signed)
CSN: [id number]     Arrival date & time 11/04/2014  2123 History   First MD Initiated Contact with Patient 12/01/2014 2142     Chief Complaint  Patient presents with  . Hypotension     (Consider location/radiation/quality/duration/timing/severity/associated sxs/prior Treatment) HPI  64 year old male presents with severe pain over the site of his prior right percutaneous biliary drain. The patient has noticed swelling over the last couple days with increased pain. Today he has noticed redness over the site. Has not had any fevers. The patient was hypotensive on arrival with a blood pressure of 80/49. The patient's biliary drain fell out about one week ago after being in for about one year. After consultation with the surgeon decided not to replace it. The patient states he is short of breath but also because of how severe his pain is. He denies any cough.  Past Medical History  Diagnosis Date  . COPD (chronic obstructive pulmonary disease)     Probable - long-standing tobacco abuse  . Chronic pain     Bilateral knees and shoulders  . GERD (gastroesophageal reflux disease)   . 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 [name]  . Pneumonia     January 2015  . Hepatitis     Hapatitis B 40 yrs ago   Past Surgical History  Procedure Laterality Date  . Right total knee arthroplasty  2003  . Left total knee arthroplasty  1998    "left knee replacement is broken" - 08/09/13   Family History  Problem Relation Age of Onset  . Diabetes type II Father   . Coronary artery disease Father     MI in his 78s  . Diabetes Mother   . Diabetes Brother   . Diabetes Sister   . Asthma Sister    History  Substance Use Topics  . Smoking status: Current Every Day Smoker -- 2.00 packs/day for 50 years    Types: Cigarettes    Start date: 08/03/1962  . Smokeless tobacco: Never Used  . Alcohol Use: No     Comment: Prior history of abuse, no alcohol for 20 years    Review of Systems  Constitutional:  Negative for fever.  Respiratory: Negative for cough and shortness of breath.   Cardiovascular: Negative for chest pain.  Gastrointestinal: Positive for abdominal pain. Negative for vomiting.  Genitourinary: Negative for dysuria.  Musculoskeletal: Negative for back pain.  All other systems reviewed and are negative.     Allergies  Review of patient's allergies indicates no known allergies.  Home Medications   Prior to Admission medications   Medication Sig Start Date End Date Taking? Authorizing Provider  albuterol (PROVENTIL) (2.5 MG/3ML) 0.083% nebulizer solution Take 3 mLs (2.5 mg total) by nebulization every 6 (six) hours as needed for wheezing or shortness of breath. Patient not taking: Reported on 11/11/2014 09/18/14   Caren Griffins, MD  albuterol (VENTOLIN HFA) 108 (90 BASE) MCG/ACT inhaler Inhale 2 puffs into the lungs every 4 (four) hours as needed for wheezing or shortness of breath. 10/09/14   Collene Gobble, MD  azithromycin (ZITHROMAX) 250 MG tablet 250mg  daily starting tomorrow (11/11/14), for 4 day Patient not taking: Reported on 11/19/2014 11/11/14   Ernestina Patches, MD  benzonatate (TESSALON) 200 MG capsule Take 200 mg by mouth 3 (three) times daily as needed for cough.    Historical Provider, MD  chlorpheniramine (CHLOR-TRIMETON) 4 MG tablet Take 1 tablet (4 mg total) by mouth every 6 (six)  hours as needed for allergies. Patient not taking: Reported on 09/16/2014 04/18/14   Collene Gobble, MD  DULERA 200-5 MCG/ACT AERO INHALE 2 PUFFS INTO THE LUNGS 2 (TWO) TIMES DAILY. 11/12/14   Collene Gobble, MD  fluticasone (FLONASE) 50 MCG/ACT nasal spray PLACE 2 SPRAYS INTO THE NOSE 2 (TWO) TIMES DAILY. Patient not taking: Reported on 09/16/2014 07/24/14   Collene Gobble, MD  mometasone-formoterol [facility]) 200-5 MCG/ACT AERO Inhale 2 puffs into the lungs 2 (two) times daily. 11/11/14   Ernestina Patches, MD  nicotine (NICODERM CQ - DOSED IN MG/24 HOURS) 21 mg/24hr patch Place 1 patch (21 mg  total) onto the skin daily. Patient not taking: Reported on 11/11/2014 09/18/14   Caren Griffins, MD  predniSONE (DELTASONE) 20 MG tablet 3 tabs po daily x 3 days, then 2 tabs x 3 days, then 1.5 tabs x 3 days, then 1 tab x 3 days, then 0.5 tabs x 3 days 11/11/14   Ernestina Patches, MD  PRESCRIPTION MEDICATION Take 5 mLs by mouth every 4 (four) hours as needed (cough). Cough medication    Historical Provider, MD  [facility] HANDIHALER 18 MCG inhalation capsule USE 1 PUFF DAILY 11/21/14   Collene Gobble, MD  tiotropium (SPIRIVA HANDIHALER) 18 MCG inhalation capsule Place 1 capsule (18 mcg total) into inhaler and inhale daily. 11/11/14   Ernestina Patches, MD   BP 113/94 mmHg  Pulse 113  Temp(Src) 98.5 F (36.9 C) (Oral)  Resp 20  SpO2 95% Physical Exam  Constitutional: He is oriented to person, place, and time. He appears well-developed and well-nourished.  Rolling around in pain  HENT:  Head: Normocephalic and atraumatic.  Right Ear: External ear normal.  Left Ear: External ear normal.  Nose: Nose normal.  Eyes: Right eye exhibits no discharge. Left eye exhibits no discharge.  Neck: Neck supple.  Cardiovascular: Regular rhythm, normal heart sounds and intact distal pulses.  Tachycardia present.   Pulmonary/Chest: Effort normal. He has wheezes (mild expiratory wheezes diffusely).  Abdominal: Soft. There is no tenderness. There is no CVA tenderness.    Musculoskeletal: He exhibits no edema.  Neurological: He is alert and oriented to person, place, and time.  Skin: Skin is warm and dry. He is not diaphoretic.  Nursing note and vitals reviewed.   ED Course  Procedures (including critical care time) Labs Review Labs Reviewed  COMPREHENSIVE METABOLIC PANEL - Abnormal; Notable for the following:    Glucose, Bld 114 (*)    GFR calc non Af Amer 77 (*)    GFR calc Af Amer 89 (*)    All other components within normal limits  CBC WITH DIFFERENTIAL/PLATELET - Abnormal; Notable for the following:      WBC 22.3 (*)    Neutrophils Relative % 80 (*)    Neutro Abs 17.8 (*)    Lymphocytes Relative 11 (*)    Monocytes Absolute 1.5 (*)    All other components within normal limits  I-STAT CG4 LACTIC ACID, ED    Imaging Review Dg Chest Port 1 View  11/30/2014   CLINICAL DATA:  Patient is white states he had pneumonia with percutaneous drain that fell out. Drain was on right side of abdomen. Shortness of breath.  EXAM: PORTABLE CHEST - 1 VIEW  COMPARISON:  11/19/2014  FINDINGS: Patient slightly rotated to the right. Lungs are adequately inflated with mild prominence of the perihilar markings suggesting mild vascular congestion. No focal lobar consolidation or effusion. Cardiomediastinal silhouette and remainder of  the exam is unchanged.  IMPRESSION: Findings suggesting mild vascular congestion.   Electronically Signed   By: Marin Olp M.D.   On: 11/29/2014 22:05     EKG Interpretation   Date/Time:  Monday November 26 2014 21:38:06 EDT Ventricular Rate:  111 PR Interval:  150 QRS Duration: 80 QT Interval:  326 QTC Calculation: 443 R Axis:   58 Text Interpretation:  Sinus tachycardia Abnormal R-wave progression, early  transition Baseline wander in lead(s) V1 No significant change since last  tracing Confirmed by Granger ([id number]) on 11/16/2014 9:43:10 PM      MDM   Final diagnoses:  None    Patient with swelling to prior perc drain site. No fevers, initially hypotensive but then normal on very close recheck. Elevated WBC similar to prior. Pain controlled with IV dilaudid. Will depend on CT findings as to whether he has an abscess/infection of this site. Care transferred to Dr. Colin Rhein with CT pending.    Sherwood Gambler, MD 11/27/14 [phone]

## 2014-11-27 ENCOUNTER — Inpatient Hospital Stay (HOSPITAL_COMMUNITY): Payer: Medicare Other

## 2014-11-27 ENCOUNTER — Encounter (HOSPITAL_COMMUNITY): Payer: Self-pay | Admitting: Internal Medicine

## 2014-11-27 ENCOUNTER — Other Ambulatory Visit: Payer: Self-pay

## 2014-11-27 ENCOUNTER — Other Ambulatory Visit (HOSPITAL_COMMUNITY): Payer: Medicare Other

## 2014-11-27 DIAGNOSIS — R6521 Severe sepsis with septic shock: Secondary | ICD-10-CM

## 2014-11-27 DIAGNOSIS — J189 Pneumonia, unspecified organism: Secondary | ICD-10-CM | POA: Diagnosis present

## 2014-11-27 DIAGNOSIS — Y95 Nosocomial condition: Secondary | ICD-10-CM | POA: Diagnosis present

## 2014-11-27 DIAGNOSIS — J9621 Acute and chronic respiratory failure with hypoxia: Secondary | ICD-10-CM | POA: Diagnosis present

## 2014-11-27 DIAGNOSIS — B370 Candidal stomatitis: Secondary | ICD-10-CM | POA: Diagnosis not present

## 2014-11-27 DIAGNOSIS — J9601 Acute respiratory failure with hypoxia: Secondary | ICD-10-CM

## 2014-11-27 DIAGNOSIS — M7989 Other specified soft tissue disorders: Secondary | ICD-10-CM | POA: Diagnosis not present

## 2014-11-27 DIAGNOSIS — A419 Sepsis, unspecified organism: Secondary | ICD-10-CM

## 2014-11-27 DIAGNOSIS — J441 Chronic obstructive pulmonary disease with (acute) exacerbation: Secondary | ICD-10-CM | POA: Diagnosis present

## 2014-11-27 DIAGNOSIS — K567 Ileus, unspecified: Secondary | ICD-10-CM | POA: Diagnosis not present

## 2014-11-27 DIAGNOSIS — Z992 Dependence on renal dialysis: Secondary | ICD-10-CM | POA: Diagnosis not present

## 2014-11-27 DIAGNOSIS — A414 Sepsis due to anaerobes: Secondary | ICD-10-CM | POA: Diagnosis not present

## 2014-11-27 DIAGNOSIS — G4700 Insomnia, unspecified: Secondary | ICD-10-CM | POA: Diagnosis not present

## 2014-11-27 DIAGNOSIS — Z96653 Presence of artificial knee joint, bilateral: Secondary | ICD-10-CM | POA: Diagnosis present

## 2014-11-27 DIAGNOSIS — R570 Cardiogenic shock: Secondary | ICD-10-CM | POA: Diagnosis present

## 2014-11-27 DIAGNOSIS — J9622 Acute and chronic respiratory failure with hypercapnia: Secondary | ICD-10-CM | POA: Diagnosis present

## 2014-11-27 DIAGNOSIS — Z79899 Other long term (current) drug therapy: Secondary | ICD-10-CM | POA: Diagnosis not present

## 2014-11-27 DIAGNOSIS — J42 Unspecified chronic bronchitis: Secondary | ICD-10-CM

## 2014-11-27 DIAGNOSIS — E875 Hyperkalemia: Secondary | ICD-10-CM | POA: Diagnosis not present

## 2014-11-27 DIAGNOSIS — K819 Cholecystitis, unspecified: Secondary | ICD-10-CM | POA: Diagnosis not present

## 2014-11-27 DIAGNOSIS — J9620 Acute and chronic respiratory failure, unspecified whether with hypoxia or hypercapnia: Secondary | ICD-10-CM | POA: Diagnosis present

## 2014-11-27 DIAGNOSIS — K219 Gastro-esophageal reflux disease without esophagitis: Secondary | ICD-10-CM | POA: Diagnosis present

## 2014-11-27 DIAGNOSIS — Z6838 Body mass index (BMI) 38.0-38.9, adult: Secondary | ICD-10-CM | POA: Diagnosis not present

## 2014-11-27 DIAGNOSIS — I4901 Ventricular fibrillation: Secondary | ICD-10-CM | POA: Diagnosis not present

## 2014-11-27 DIAGNOSIS — R5381 Other malaise: Secondary | ICD-10-CM | POA: Diagnosis not present

## 2014-11-27 DIAGNOSIS — R627 Adult failure to thrive: Secondary | ICD-10-CM | POA: Diagnosis present

## 2014-11-27 DIAGNOSIS — N170 Acute kidney failure with tubular necrosis: Secondary | ICD-10-CM | POA: Diagnosis present

## 2014-11-27 DIAGNOSIS — J432 Centrilobular emphysema: Secondary | ICD-10-CM | POA: Diagnosis not present

## 2014-11-27 DIAGNOSIS — D751 Secondary polycythemia: Secondary | ICD-10-CM | POA: Diagnosis present

## 2014-11-27 DIAGNOSIS — I4891 Unspecified atrial fibrillation: Secondary | ICD-10-CM | POA: Diagnosis not present

## 2014-11-27 DIAGNOSIS — Z720 Tobacco use: Secondary | ICD-10-CM

## 2014-11-27 DIAGNOSIS — N19 Unspecified kidney failure: Secondary | ICD-10-CM | POA: Diagnosis not present

## 2014-11-27 DIAGNOSIS — Z7951 Long term (current) use of inhaled steroids: Secondary | ICD-10-CM | POA: Diagnosis not present

## 2014-11-27 DIAGNOSIS — I509 Heart failure, unspecified: Secondary | ICD-10-CM | POA: Diagnosis present

## 2014-11-27 DIAGNOSIS — G894 Chronic pain syndrome: Secondary | ICD-10-CM | POA: Diagnosis present

## 2014-11-27 DIAGNOSIS — Z9981 Dependence on supplemental oxygen: Secondary | ICD-10-CM | POA: Diagnosis not present

## 2014-11-27 DIAGNOSIS — A4150 Gram-negative sepsis, unspecified: Secondary | ICD-10-CM | POA: Diagnosis present

## 2014-11-27 DIAGNOSIS — F17210 Nicotine dependence, cigarettes, uncomplicated: Secondary | ICD-10-CM | POA: Diagnosis present

## 2014-11-27 DIAGNOSIS — E874 Mixed disorder of acid-base balance: Secondary | ICD-10-CM | POA: Diagnosis not present

## 2014-11-27 DIAGNOSIS — I483 Typical atrial flutter: Secondary | ICD-10-CM | POA: Diagnosis not present

## 2014-11-27 DIAGNOSIS — R109 Unspecified abdominal pain: Secondary | ICD-10-CM | POA: Diagnosis present

## 2014-11-27 DIAGNOSIS — N179 Acute kidney failure, unspecified: Secondary | ICD-10-CM | POA: Diagnosis not present

## 2014-11-27 DIAGNOSIS — T508X5A Adverse effect of diagnostic agents, initial encounter: Secondary | ICD-10-CM | POA: Diagnosis present

## 2014-11-27 DIAGNOSIS — Z7952 Long term (current) use of systemic steroids: Secondary | ICD-10-CM | POA: Diagnosis not present

## 2014-11-27 DIAGNOSIS — D649 Anemia, unspecified: Secondary | ICD-10-CM | POA: Diagnosis not present

## 2014-11-27 DIAGNOSIS — E8809 Other disorders of plasma-protein metabolism, not elsewhere classified: Secondary | ICD-10-CM | POA: Diagnosis not present

## 2014-11-27 DIAGNOSIS — R0600 Dyspnea, unspecified: Secondary | ICD-10-CM

## 2014-11-27 DIAGNOSIS — I4892 Unspecified atrial flutter: Secondary | ICD-10-CM | POA: Diagnosis not present

## 2014-11-27 DIAGNOSIS — K8012 Calculus of gallbladder with acute and chronic cholecystitis without obstruction: Secondary | ICD-10-CM | POA: Diagnosis present

## 2014-11-27 DIAGNOSIS — T814XXA Infection following a procedure, initial encounter: Secondary | ICD-10-CM | POA: Diagnosis present

## 2014-11-27 DIAGNOSIS — D473 Essential (hemorrhagic) thrombocythemia: Secondary | ICD-10-CM | POA: Diagnosis present

## 2014-11-27 DIAGNOSIS — K810 Acute cholecystitis: Secondary | ICD-10-CM | POA: Diagnosis not present

## 2014-11-27 LAB — CBC WITH DIFFERENTIAL/PLATELET
BASOS ABS: 0 10*3/uL (ref 0.0–0.1)
BASOS PCT: 0 % (ref 0–1)
EOS PCT: 1 % (ref 0–5)
Eosinophils Absolute: 0.2 10*3/uL (ref 0.0–0.7)
HCT: 44.5 % (ref 39.0–52.0)
HEMOGLOBIN: 14.6 g/dL (ref 13.0–17.0)
Lymphocytes Relative: 9 % — ABNORMAL LOW (ref 12–46)
Lymphs Abs: 2 10*3/uL (ref 0.7–4.0)
MCH: 31.3 pg (ref 26.0–34.0)
MCHC: 32.8 g/dL (ref 30.0–36.0)
MCV: 95.3 fL (ref 78.0–100.0)
MONOS PCT: 3 % (ref 3–12)
Monocytes Absolute: 0.7 10*3/uL (ref 0.1–1.0)
Neutro Abs: 20 10*3/uL — ABNORMAL HIGH (ref 1.7–7.7)
Neutrophils Relative %: 87 % — ABNORMAL HIGH (ref 43–77)
Platelets: 365 10*3/uL (ref 150–400)
RBC: 4.67 MIL/uL (ref 4.22–5.81)
RDW: 15 % (ref 11.5–15.5)
WBC: 23 10*3/uL — ABNORMAL HIGH (ref 4.0–10.5)

## 2014-11-27 LAB — HEPATIC FUNCTION PANEL
ALT: 20 U/L (ref 0–53)
AST: 18 U/L (ref 0–37)
Albumin: 3.3 g/dL — ABNORMAL LOW (ref 3.5–5.2)
Alkaline Phosphatase: 70 U/L (ref 39–117)
Bilirubin, Direct: 0.2 mg/dL (ref 0.0–0.5)
Indirect Bilirubin: 0.6 mg/dL (ref 0.3–0.9)
TOTAL PROTEIN: 6.8 g/dL (ref 6.0–8.3)
Total Bilirubin: 0.8 mg/dL (ref 0.3–1.2)

## 2014-11-27 LAB — BLOOD GAS, ARTERIAL
ACID-BASE DEFICIT: 1.9 mmol/L (ref 0.0–2.0)
Acid-base deficit: 4.4 mmol/L — ABNORMAL HIGH (ref 0.0–2.0)
Bicarbonate: 24.9 mEq/L — ABNORMAL HIGH (ref 20.0–24.0)
Bicarbonate: 24.8 mEq/L — ABNORMAL HIGH (ref 20.0–24.0)
Drawn by: 295031
Drawn by: 295031
FIO2: 1 %
MECHVT: 600 mL
O2 Content: 5 L/min
O2 Saturation: 97.5 %
O2 Saturation: 99.2 %
PATIENT TEMPERATURE: 98.6
PATIENT TEMPERATURE: 98.6
PEEP/CPAP: 5 cmH2O
PH ART: 7.211 — AB (ref 7.350–7.450)
PO2 ART: 117 mmHg — AB (ref 80.0–100.0)
RATE: 12 resp/min
TCO2: 22 mmol/L (ref 0–100)
TCO2: 22.6 mmol/L (ref 0–100)
pCO2 arterial: 52.4 mmHg — ABNORMAL HIGH (ref 35.0–45.0)
pCO2 arterial: 64.4 mmHg (ref 35.0–45.0)
pH, Arterial: 7.299 — ABNORMAL LOW (ref 7.350–7.450)
pO2, Arterial: 386 mmHg — ABNORMAL HIGH (ref 80.0–100.0)

## 2014-11-27 LAB — TROPONIN I
Troponin I: <0.03 ng/mL (ref <0.031)
Troponin I: <0.03 ng/mL (ref <0.031)
Troponin I: <0.03 ng/mL (ref <0.031)

## 2014-11-27 LAB — BASIC METABOLIC PANEL
ANION GAP: 7 (ref 5–15)
BUN: 19 mg/dL (ref 6–23)
CO2: 26 mmol/L (ref 19–32)
CREATININE: 0.9 mg/dL (ref 0.50–1.35)
Calcium: 8.5 mg/dL (ref 8.4–10.5)
Chloride: 103 mmol/L (ref 96–112)
GFR calc Af Amer: >90 mL/min (ref >90)
GFR calc non Af Amer: 89 mL/min — ABNORMAL LOW (ref >90)
Glucose, Bld: 122 mg/dL — ABNORMAL HIGH (ref 70–99)
Potassium: 4.3 mmol/L (ref 3.5–5.1)
Sodium: 136 mmol/L (ref 135–145)

## 2014-11-27 LAB — LACTIC ACID, PLASMA: Lactic Acid, Venous: 1.1 mmol/L (ref 0.5–2.0)

## 2014-11-27 LAB — GLUCOSE, CAPILLARY
Glucose-Capillary: 120 mg/dL — ABNORMAL HIGH (ref 70–99)
Glucose-Capillary: 129 mg/dL — ABNORMAL HIGH (ref 70–99)
Glucose-Capillary: 148 mg/dL — ABNORMAL HIGH (ref 70–99)
Glucose-Capillary: 182 mg/dL — ABNORMAL HIGH (ref 70–99)
Glucose-Capillary: 160 mg/dL — ABNORMAL HIGH (ref 70–99)

## 2014-11-27 LAB — BRAIN NATRIURETIC PEPTIDE: B Natriuretic Peptide: 31 pg/mL (ref 0.0–100.0)

## 2014-11-27 LAB — MRSA PCR SCREENING: MRSA by PCR: NEGATIVE

## 2014-11-27 LAB — APTT: aPTT: 30 seconds (ref 24–37)

## 2014-11-27 LAB — CLOSTRIDIUM DIFFICILE BY PCR: Toxigenic C. Difficile by PCR: NEGATIVE

## 2014-11-27 LAB — PROTIME-INR
INR: 1.08 (ref 0.00–1.49)
PROTHROMBIN TIME: 14.1 s (ref 11.6–15.2)

## 2014-11-27 LAB — PROCALCITONIN: Procalcitonin: 0.14 ng/mL

## 2014-11-27 MED ORDER — LIDOCAINE HCL (CARDIAC) 20 MG/ML IV SOLN
INTRAVENOUS | Status: AC
  Filled 2014-11-27: qty 5

## 2014-11-27 MED ORDER — FENTANYL CITRATE (PF) 100 MCG/2ML IJ SOLN
100.0000 ug | INTRAMUSCULAR | Status: DC | PRN
  Administered 2014-12-04 – 2014-12-07 (×2): 100 ug via INTRAVENOUS
  Filled 2014-11-27 (×2): qty 2

## 2014-11-27 MED ORDER — FLUTICASONE PROPIONATE 50 MCG/ACT NA SUSP
2.0000 | Freq: Every day | NASAL | Status: DC
Start: 2014-11-27 — End: 2014-11-27

## 2014-11-27 MED ORDER — ACETAMINOPHEN 325 MG PO TABS
650.0000 mg | ORAL_TABLET | Freq: Four times a day (QID) | ORAL | Status: DC | PRN
  Administered 2014-12-14 – 2014-12-17 (×5): 650 mg via ORAL
  Filled 2014-11-27 (×5): qty 2

## 2014-11-27 MED ORDER — DOCUSATE SODIUM 50 MG/5ML PO LIQD
100.0000 mg | Freq: Two times a day (BID) | ORAL | Status: DC | PRN
  Filled 2014-11-27: qty 10

## 2014-11-27 MED ORDER — FENTANYL CITRATE (PF) 100 MCG/2ML IJ SOLN: 50.0000 ug | Freq: Once | INTRAMUSCULAR | Status: DC

## 2014-11-27 MED ORDER — HYDROMORPHONE HCL 1 MG/ML IJ SOLN: 1.0000 mg | Freq: Once | INTRAMUSCULAR | Status: DC

## 2014-11-27 MED ORDER — TIOTROPIUM BROMIDE MONOHYDRATE 18 MCG IN CAPS
18.0000 ug | ORAL_CAPSULE | Freq: Every day | RESPIRATORY_TRACT | Status: DC
  Filled 2014-11-27: qty 5

## 2014-11-27 MED ORDER — VANCOMYCIN HCL IN DEXTROSE 750-5 MG/150ML-% IV SOLN
750.0000 mg | Freq: Two times a day (BID) | INTRAVENOUS | Status: DC
  Administered 2014-11-27 – 2014-11-28 (×2): 750 mg via INTRAVENOUS
  Filled 2014-11-27 (×3): qty 150

## 2014-11-27 MED ORDER — ONDANSETRON HCL 4 MG/2ML IJ SOLN
4.0000 mg | Freq: Four times a day (QID) | INTRAMUSCULAR | Status: DC | PRN
  Administered 2014-12-12 – 2014-12-18 (×3): 4 mg via INTRAVENOUS
  Filled 2014-11-27 (×3): qty 2

## 2014-11-27 MED ORDER — MIDAZOLAM HCL 2 MG/2ML IJ SOLN: 2.0000 mg | INTRAMUSCULAR | Status: DC | PRN

## 2014-11-27 MED ORDER — SODIUM CHLORIDE 0.9 % IV SOLN
INTRAVENOUS | Status: DC
  Administered 2014-11-27 – 2014-11-30 (×2): via INTRAVENOUS
  Administered 2014-12-01: 1000 mL via INTRAVENOUS

## 2014-11-27 MED ORDER — IOHEXOL 300 MG/ML  SOLN: 10.0000 mL | Freq: Once | INTRAMUSCULAR | Status: AC | PRN

## 2014-11-27 MED ORDER — SODIUM CHLORIDE 0.9 % IV SOLN
2000.0000 mg | Freq: Once | INTRAVENOUS | Status: AC
  Administered 2014-11-27: 2000 mg via INTRAVENOUS
  Filled 2014-11-27: qty 2000

## 2014-11-27 MED ORDER — CHLORHEXIDINE GLUCONATE 0.12 % MT SOLN
15.0000 mL | Freq: Two times a day (BID) | OROMUCOSAL | Status: DC
  Administered 2014-11-27 – 2014-12-06 (×20): 15 mL via OROMUCOSAL
  Filled 2014-11-27 (×20): qty 15

## 2014-11-27 MED ORDER — ONDANSETRON HCL 4 MG PO TABS: 4.0000 mg | ORAL_TABLET | Freq: Four times a day (QID) | ORAL | Status: DC | PRN

## 2014-11-27 MED ORDER — ROCURONIUM BROMIDE 50 MG/5ML IV SOLN
INTRAVENOUS | Status: AC
  Administered 2014-11-27: 10 mg
  Filled 2014-11-27: qty 2

## 2014-11-27 MED ORDER — FENTANYL BOLUS VIA INFUSION
50.0000 ug | INTRAVENOUS | Status: DC | PRN
  Administered 2014-11-28 – 2014-12-06 (×18): 50 ug via INTRAVENOUS
  Filled 2014-11-27: qty 50

## 2014-11-27 MED ORDER — PANTOPRAZOLE SODIUM 40 MG IV SOLR
40.0000 mg | INTRAVENOUS | Status: DC
  Administered 2014-11-27 – 2014-12-07 (×11): 40 mg via INTRAVENOUS
  Filled 2014-11-27 (×11): qty 40

## 2014-11-27 MED ORDER — SODIUM CHLORIDE 0.9 % IV BOLUS (SEPSIS)
1000.0000 mL | Freq: Once | INTRAVENOUS | Status: AC
  Administered 2014-11-27: 1000 mL via INTRAVENOUS

## 2014-11-27 MED ORDER — IOHEXOL 300 MG/ML  SOLN
100.0000 mL | Freq: Once | INTRAMUSCULAR | Status: AC | PRN
  Administered 2014-11-27: 100 mL via INTRAVENOUS

## 2014-11-27 MED ORDER — FENTANYL CITRATE (PF) 100 MCG/2ML IJ SOLN
100.0000 ug | INTRAMUSCULAR | Status: AC | PRN
  Administered 2014-11-27 (×3): 100 ug via INTRAVENOUS
  Filled 2014-11-27: qty 2

## 2014-11-27 MED ORDER — SODIUM CHLORIDE 0.9 % IV SOLN
INTRAVENOUS | Status: DC
  Administered 2014-11-27: 11:00:00 via INTRAVENOUS
  Administered 2014-11-28 – 2014-12-01 (×2): 1000 mL via INTRAVENOUS
  Administered 2014-12-09: 14:00:00 via INTRAVENOUS

## 2014-11-27 MED ORDER — PIPERACILLIN-TAZOBACTAM 3.375 G IVPB
3.3750 g | Freq: Three times a day (TID) | INTRAVENOUS | Status: DC
  Administered 2014-11-27 – 2014-11-28 (×4): 3.375 g via INTRAVENOUS
  Filled 2014-11-27 (×4): qty 50

## 2014-11-27 MED ORDER — ETOMIDATE 2 MG/ML IV SOLN
INTRAVENOUS | Status: AC
  Administered 2014-11-27: 20 mg
  Filled 2014-11-27: qty 20

## 2014-11-27 MED ORDER — LIDOCAINE HCL 1 % IJ SOLN
INTRAMUSCULAR | Status: AC
  Filled 2014-11-27: qty 20

## 2014-11-27 MED ORDER — FUROSEMIDE 10 MG/ML IJ SOLN
20.0000 mg | Freq: Once | INTRAMUSCULAR | Status: DC
  Filled 2014-11-27: qty 2

## 2014-11-27 MED ORDER — SODIUM CHLORIDE 0.9 % IJ SOLN
3.0000 mL | Freq: Two times a day (BID) | INTRAMUSCULAR | Status: DC
  Administered 2014-11-27 – 2014-11-30 (×4): 3 mL via INTRAVENOUS
  Administered 2014-12-01 – 2014-12-02 (×2): 10 mL via INTRAVENOUS
  Administered 2014-12-03 – 2014-12-04 (×3): 3 mL via INTRAVENOUS
  Administered 2014-12-04: 10 mL via INTRAVENOUS
  Administered 2014-12-05: 3 mL via INTRAVENOUS
  Administered 2014-12-05: 10 mL via INTRAVENOUS
  Administered 2014-12-06 – 2014-12-07 (×2): 3 mL via INTRAVENOUS
  Administered 2014-12-07: 10 mL via INTRAVENOUS
  Administered 2014-12-08 – 2014-12-09 (×2): 3 mL via INTRAVENOUS

## 2014-11-27 MED ORDER — NOREPINEPHRINE BITARTRATE 1 MG/ML IV SOLN
0.0000 ug/min | INTRAVENOUS | Status: DC
  Administered 2014-11-27: 40 ug/min via INTRAVENOUS
  Administered 2014-11-27: 15 ug/min via INTRAVENOUS
  Administered 2014-11-27: 35 ug/min via INTRAVENOUS
  Filled 2014-11-27 (×4): qty 4

## 2014-11-27 MED ORDER — IOHEXOL 300 MG/ML  SOLN
50.0000 mL | Freq: Once | INTRAMUSCULAR | Status: AC | PRN
  Administered 2014-11-26: 50 mL via ORAL

## 2014-11-27 MED ORDER — MIDAZOLAM HCL 2 MG/2ML IJ SOLN
INTRAMUSCULAR | Status: AC
  Filled 2014-11-27: qty 6

## 2014-11-27 MED ORDER — SUCCINYLCHOLINE CHLORIDE 20 MG/ML IJ SOLN
INTRAMUSCULAR | Status: AC
  Filled 2014-11-27: qty 1

## 2014-11-27 MED ORDER — CETYLPYRIDINIUM CHLORIDE 0.05 % MT LIQD
7.0000 mL | Freq: Four times a day (QID) | OROMUCOSAL | Status: DC
  Administered 2014-11-27 – 2014-12-06 (×34): 7 mL via OROMUCOSAL

## 2014-11-27 MED ORDER — ACETAMINOPHEN 650 MG RE SUPP: 650.0000 mg | Freq: Four times a day (QID) | RECTAL | Status: DC | PRN

## 2014-11-27 MED ORDER — PIPERACILLIN-TAZOBACTAM 3.375 G IVPB 30 MIN
3.3750 g | Freq: Once | INTRAVENOUS | Status: AC
  Administered 2014-11-27: 3.375 g via INTRAVENOUS
  Filled 2014-11-27: qty 50

## 2014-11-27 MED ORDER — PHENYLEPHRINE HCL 10 MG/ML IJ SOLN
30.0000 ug/min | INTRAVENOUS | Status: DC
  Administered 2014-11-27: 150 ug/min via INTRAVENOUS
  Filled 2014-11-27 (×2): qty 1

## 2014-11-27 MED ORDER — MIDAZOLAM HCL 2 MG/2ML IJ SOLN
2.0000 mg | INTRAMUSCULAR | Status: AC | PRN
  Administered 2014-11-27 (×3): 2 mg via INTRAVENOUS

## 2014-11-27 MED ORDER — SODIUM CHLORIDE 0.9 % IV SOLN
25.0000 ug/h | INTRAVENOUS | Status: DC
  Administered 2014-11-27: 50 ug/h via INTRAVENOUS
  Administered 2014-11-27: 150 ug/h via INTRAVENOUS
  Administered 2014-11-27 – 2014-11-28 (×2): 100 ug/h via INTRAVENOUS
  Administered 2014-11-29 – 2014-11-30 (×4): 300 ug/h via INTRAVENOUS
  Administered 2014-11-30 (×2): 400 ug/h via INTRAVENOUS
  Administered 2014-11-30: 300 ug/h via INTRAVENOUS
  Administered 2014-12-01: 400 ug/h via INTRAVENOUS
  Administered 2014-12-01: 250 ug/h via INTRAVENOUS
  Administered 2014-12-01: 400 ug/h via INTRAVENOUS
  Administered 2014-12-02: 250 ug/h via INTRAVENOUS
  Administered 2014-12-02: 50 ug/h via INTRAVENOUS
  Administered 2014-12-02: 400 ug/h via INTRAVENOUS
  Administered 2014-12-03 – 2014-12-05 (×5): 250 ug/h via INTRAVENOUS
  Administered 2014-12-06: 50 ug/h via INTRAVENOUS
  Filled 2014-11-27 (×21): qty 50

## 2014-11-27 MED ORDER — FENTANYL CITRATE (PF) 100 MCG/2ML IJ SOLN
INTRAMUSCULAR | Status: AC
Start: 2014-11-27 — End: 2014-11-27
  Filled 2014-11-27: qty 2

## 2014-11-27 MED ORDER — MOMETASONE FURO-FORMOTEROL FUM 200-5 MCG/ACT IN AERO
2.0000 | INHALATION_SPRAY | Freq: Two times a day (BID) | RESPIRATORY_TRACT | Status: DC
Start: 2014-11-27 — End: 2014-11-28
  Filled 2014-11-27: qty 8.8

## 2014-11-27 MED ORDER — HYDROMORPHONE HCL 1 MG/ML IJ SOLN
1.0000 mg | Freq: Once | INTRAMUSCULAR | Status: AC
  Administered 2014-11-27: 1 mg via INTRAVENOUS
  Filled 2014-11-27: qty 1

## 2014-11-27 MED ORDER — HYDROMORPHONE HCL 1 MG/ML IJ SOLN
0.5000 mg | INTRAMUSCULAR | Status: DC | PRN
  Administered 2014-11-27: 0.5 mg via INTRAVENOUS
  Filled 2014-11-27: qty 1

## 2014-11-27 MED ORDER — NOREPINEPHRINE BITARTRATE 1 MG/ML IV SOLN
0.0000 ug/min | INTRAVENOUS | Status: DC
  Administered 2014-11-27: 40 ug/min via INTRAVENOUS
  Administered 2014-11-28: 5 ug/min via INTRAVENOUS
  Administered 2014-11-30: 20 ug/min via INTRAVENOUS
  Administered 2014-12-01: 25 ug/min via INTRAVENOUS
  Administered 2014-12-02 – 2014-12-03 (×4): 40 ug/min via INTRAVENOUS
  Administered 2014-12-04: 35 ug/min via INTRAVENOUS
  Administered 2014-12-04: 30 ug/min via INTRAVENOUS
  Administered 2014-12-04: 10 ug/min via INTRAVENOUS
  Administered 2014-12-04: 25 ug/min via INTRAVENOUS
  Administered 2014-12-06: 6 ug/min via INTRAVENOUS
  Administered 2014-12-06: 4 ug/min via INTRAVENOUS
  Administered 2014-12-06: 8 ug/min via INTRAVENOUS
  Filled 2014-11-27 (×13): qty 16

## 2014-11-27 NOTE — Progress Notes (Addendum)
ANTIBIOTIC CONSULT NOTE - INITIAL  Pharmacy Consult for Zosyn/Vancomycin Indication: Intra-abdominal infection/Sepsis  No Known Allergies  Patient Measurements: Height:  71 inches Weight: 113.4 kg  Vital Signs: Temp: 100.2 F (37.9 C) (04/26 0300) Temp Source: Oral (04/26 0300) BP: 145/98 mmHg (04/26 0300) Pulse Rate: 119 (04/26 0300) Intake/Output from previous day: 04/25 0701 - 04/26 0700 In: -  Out: 250 [Urine:250] Intake/Output from this shift: Total I/O In: -  Out: 250 [Urine:250]  Labs:  Recent Labs  11/25/2014 2154 11/27/14 0320  WBC 22.3* 23.0*  HGB 15.2 14.6  PLT 398 365  CREATININE 1.01 0.90   Estimated Creatinine Clearance: 107.5 mL/min (by C-G formula based on Cr of 0.9). No results for input(s): VANCOTROUGH, VANCOPEAK, VANCORANDOM, GENTTROUGH, GENTPEAK, GENTRANDOM, TOBRATROUGH, TOBRAPEAK, TOBRARND, AMIKACINPEAK, AMIKACINTROU, AMIKACIN in the last 72 hours.   Microbiology: No results found for this or any previous visit (from the past 720 hour(s)).  Medical History: Past Medical History  Diagnosis Date  . COPD (chronic obstructive pulmonary disease)     Probable - long-standing tobacco abuse  . Chronic pain     Bilateral knees and shoulders  . GERD (gastroesophageal reflux disease)   . 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 [name]  . Pneumonia     January 2015  . Hepatitis     Hapatitis B 40 yrs ago    Medications:  Scheduled:  . mometasone-formoterol  2 puff Inhalation BID  . sodium chloride  3 mL Intravenous Q12H  . tiotropium  18 mcg Inhalation Daily  . vancomycin  2,000 mg Intravenous Once   Infusions:   Assessment: 64 yr male with percutaneous drain for acute cholecystitis which dislodged last week.  Patient presents with complaint of abdominal pain.  Febrile, elevated WBC.  CT shows possible biliary leak and ? Acute cholecystitis  Patient to be admitted for Sepsis and Cholecystitis.  Surgery consulted  Vanc 2gm x 1 ordered  in ED.  Zosyn 3.375gm x 1 given @ 01:57  Upon admission, Zosyn & Vancomycin  to be continued with pharmacy dosing  Blood cultures ordered  Goal of Therapy:  Eradication of infection Vancomycin Trough level : 15-20 mcg/ml  Plan:  Zosyn 3.375gm IV q8h (each dose infused over 4 hrs) Vancomycin 750mg  IV q12h F/U cultures/sensitivities  Jonathon Castelo, Toribio Harbour, PharmD 11/27/2014,4:30 AM

## 2014-11-27 NOTE — Progress Notes (Signed)
Family, wife and 3 daughters, updated upon arrival to ICU on intubation and central line insertion. Very tearful and upset but understanding of process of care. Prayed with family at bedside per family request. Chaplain also called.

## 2014-11-27 NOTE — Progress Notes (Signed)
eLink Physician-Brief Progress Note Patient Name: Allen Green DOB: [birth year] MRN: [id number]   Date of Service  11/27/2014  HPI/Events of Note  Call from [facility] requesting assistance with patient admitted with acute cholecystitis now febrile, hypotensive, and tachycardic with mild pulm edema on CXR.  eICU Interventions  Plan: to be transferred to higher level of care To be seen by PCCM Order for NEO gtt for BP support     Intervention Category Major Interventions: Sepsis - evaluation and management  Destynee Stringfellow 11/27/2014, 6:42 AM

## 2014-11-27 NOTE — ED Provider Notes (Signed)
  Physical Exam  BP 98/63 mmHg  Pulse 127  Temp(Src) 98.4 F (36.9 C) (Oral)  Resp 22  SpO2 95%  Physical Exam  ED Course  Procedures  MDM Assumed care of pt from Dr. Regenia Skeeter.  Pt awaiting ct scan, had prior cholecystitis treated with perc drain which self extricated 3 weeks ago.  Consulted surgery after CT scan demonstrating cholecystitis who recommended medical admission.  Admitted in stable condition after abx.     SOB (shortness of breath) - Plan: DG CHEST PORT 1 VIEW, DG CHEST PORT 1 VIEW  Cholecystitis, acute - Plan: IR Radiologist Eval & Mgmt, IR Radiologist Eval & Mgmt         Debby Freiberg, MD 11/27/14 [phone]

## 2014-11-27 NOTE — Procedures (Signed)
Post-Procedure Note  Pre-operative Diagnosis: Sepsis and cholecystitis       Post-operative Diagnosis: cholecystitis and abscess along old drain tract   Indications: Sepsis  Procedure Details:   Ultrasound demonstrated a fluid filled tract from the old skin site to gallbladder.  Small incision made at old drain site and purulent fluid immediately drained.  A 5 French catheter was then advanced along the old tract with US guidance.  Catheter placement confirmed in gallbladder with [place] and small contrast injection.  10 French drain placed and sutured to skin.  Findings: Subcutaneous abscess in old drain tract.  Subcutaneous abscess was decompressed.  New drain placed in gallbladder and 30 ml of thick purulent green fluid was aspirated.    Complications: None     Condition: stable  Plan: Follow drainage.

## 2014-11-27 NOTE — Progress Notes (Signed)
Pt arrived to 4th floor diaphoretic, pt's sheets completely soaked, and very short of breath. BP at this time 64/43. HR in 120's. Rapid Response RN called to come see pt. Dr. Hal Hope paged, gave orders to transfer patient to ICU. Allen Green I

## 2014-11-27 NOTE — Progress Notes (Addendum)
Arrived in 1236, pt room, to receive report this am from previous RN. Pt was an RRT call at 6:00am.  Pt currently sitting up on bedside, diaphoretic, ashen in color. Pt mentating, alert and oriented but exhausted with increased work of breathing with labored respirations. Breath sounds diminished but with audible crackles. EKG done with poor reading due to diaphoresis and need to place leads posteriorly. C/o pain on right side at previous perc site. RUQ area hard and hot to touch with raised area. [name] Minor NP in. Discussion to intubate and place central line for IR perc drain replacement. Pt cannot lay flat with current respiratory status. Pt in agreement to above, states, "this is not going to get better until they put that drain back in."  Wife, Allen Green, on the way.

## 2014-11-27 NOTE — H&P (Addendum)
[facility] History and Physical  Allen Green LJQ:[id number] DOB: [birth year] DOA: 11/14/2014  Referring physician: Dr. Colin Rhein. PCP: Linton Rump, PA   Chief Complaint: Abdominal pain.  HPI: Allen Green is a 64 y.o. male with history of COPD and secondary polycythemia presents the ER because of worsening abdominal pain. Patient had a percutaneous drain for acute cholecystitis which was accidentally dislodged last week. Since then patient started developing abdominal pain again and over the last 3 days patient noticed a knot on his right upper quadrant. Denies any nausea vomiting or diarrhea. Has been having some subjective feeling of fever or chills. In the ER patient was found to be hypotensive and febrile. Labs revealed leukocytosis. CT abdomen and pelvis shows features concerning for acute cholecystitis and possible biliary leak. On-call surgeon Dr. Excell Seltzer was consulted and patient has been admitted for further management. On my exam patient states that he has got increasingly short of breath. Patient states that he gets short of breath more on lying down. Denies any chest pain or productive cough. Patient states he is short of breath usually but became more short of breath after admission. Patient also was recently treated for pneumonia.  Review of Systems: As presented in the history of presenting illness, rest negative.  Past Medical History  Diagnosis Date  . COPD (chronic obstructive pulmonary disease)     Probable - long-standing tobacco abuse  . Chronic pain     Bilateral knees and shoulders  . GERD (gastroesophageal reflux disease)   . 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 [name]  . Pneumonia     January 2015  . Hepatitis     Hapatitis B 40 yrs ago   Past Surgical History  Procedure Laterality Date  . Right total knee arthroplasty  2003  . Left total knee arthroplasty  1998    "left knee replacement is broken" - 08/09/13   Social History:  reports that he has been  smoking Cigarettes.  He started smoking about 52 years ago. He has a 100 pack-year smoking history. He has never used smokeless tobacco. He reports that he does not drink alcohol or use illicit drugs. Where does patient live at home. Can patient participate in ADLs? Yes.  No Known Allergies  Family History:  Family History  Problem Relation Age of Onset  . Diabetes type II Father   . Coronary artery disease Father     MI in his 74s  . Diabetes Mother   . Diabetes Brother   . Diabetes Sister   . Asthma Sister       Prior to Admission medications   Medication Sig Start Date End Date Taking? Authorizing Provider  albuterol (VENTOLIN HFA) 108 (90 BASE) MCG/ACT inhaler Inhale 2 puffs into the lungs every 4 (four) hours as needed for wheezing or shortness of breath. 10/09/14  Yes Collene Gobble, MD  benzonatate (TESSALON) 200 MG capsule Take 200 mg by mouth 3 (three) times daily as needed for cough.   Yes Historical Provider, MD  DULERA 200-5 MCG/ACT AERO INHALE 2 PUFFS INTO THE LUNGS 2 (TWO) TIMES DAILY. 11/12/14  Yes Collene Gobble, MD  tiotropium (SPIRIVA HANDIHALER) 18 MCG inhalation capsule Place 1 capsule (18 mcg total) into inhaler and inhale daily. 11/11/14  Yes Ernestina Patches, MD  albuterol (PROVENTIL) (2.5 MG/3ML) 0.083% nebulizer solution Take 3 mLs (2.5 mg total) by nebulization every 6 (six) hours as needed for wheezing or shortness of breath. Patient not taking:  Reported on 11/11/2014 09/18/14   Caren Griffins, MD  azithromycin (ZITHROMAX) 250 MG tablet 250mg  daily starting tomorrow (11/11/14), for 4 day Patient not taking: Reported on 11/19/2014 11/11/14   Ernestina Patches, MD  chlorpheniramine (CHLOR-TRIMETON) 4 MG tablet Take 1 tablet (4 mg total) by mouth every 6 (six) hours as needed for allergies. Patient not taking: Reported on 09/16/2014 04/18/14   Collene Gobble, MD  fluticasone (FLONASE) 50 MCG/ACT nasal spray PLACE 2 SPRAYS INTO THE NOSE 2 (TWO) TIMES DAILY. Patient not taking:  Reported on 09/16/2014 07/24/14   Collene Gobble, MD  mometasone-formoterol [facility]) 200-5 MCG/ACT AERO Inhale 2 puffs into the lungs 2 (two) times daily. 11/11/14   Ernestina Patches, MD  nicotine (NICODERM CQ - DOSED IN MG/24 HOURS) 21 mg/24hr patch Place 1 patch (21 mg total) onto the skin daily. Patient not taking: Reported on 11/11/2014 09/18/14   Caren Griffins, MD  predniSONE (DELTASONE) 20 MG tablet 3 tabs po daily x 3 days, then 2 tabs x 3 days, then 1.5 tabs x 3 days, then 1 tab x 3 days, then 0.5 tabs x 3 days 11/11/14   Ernestina Patches, MD  PRESCRIPTION MEDICATION Take 5 mLs by mouth every 4 (four) hours as needed (cough). Cough medication    Historical Provider, MD  [facility] HANDIHALER 18 MCG inhalation capsule USE 1 PUFF DAILY Patient not taking: Reported on 11/27/2014 11/21/14   Collene Gobble, MD    Physical Exam: Filed Vitals:   11/07/2014 2139 11/27/14 0105 11/27/14 0216 11/27/14 0300  BP: 113/94 139/84 141/108 145/98  Pulse: 113 114 143 119  Temp: 98.5 F (36.9 C) 99.3 F (37.4 C) 100.3 F (37.9 C) 100.2 F (37.9 C)  TempSrc: Oral Oral Oral Oral  Resp: 20 20 20 20   SpO2: 95% 94% 94% 90%     General:  Obese mildly short of breath but able to talk sentences.  Eyes: Anicteric no pallor.  ENT: No discharge from the ears eyes nose and mouth.  Neck: JVD not appreciated. No mass felt.  Cardiovascular: S1-S2 heard.  Respiratory: No rhonchi or crepitations.  Abdomen: There is a small knot around the right upper quadrant felt. Mild tenderness in the right upper quadrant. No guarding or rigidity.  Skin: Chronic skin changes.  Musculoskeletal: No edema.  Psychiatric: Appears normal.  Neurologic: Alert awake oriented to time place and person. Moves all extremities.  Labs on Admission:  Basic Metabolic Panel:  Recent Labs Lab 11/16/2014 2154  NA 139  K 4.3  CL 101  CO2 29  GLUCOSE 114*  BUN 22  CREATININE 1.01  CALCIUM 9.1   Liver Function Tests:  Recent  Labs Lab 11/02/2014 2154  AST 18  ALT 21  ALKPHOS 72  BILITOT 0.4  PROT 7.4  ALBUMIN 3.7   No results for input(s): LIPASE, AMYLASE in the last 168 hours. No results for input(s): AMMONIA in the last 168 hours. CBC:  Recent Labs Lab 11/06/2014 2154 11/27/14 0320  WBC 22.3* 23.0*  NEUTROABS 17.8* 20.0*  HGB 15.2 14.6  HCT 44.9 44.5  MCV 94.3 95.3  PLT 398 365   Cardiac Enzymes: No results for input(s): CKTOTAL, CKMB, CKMBINDEX, TROPONINI in the last 168 hours.  BNP (last 3 results)  Recent Labs  09/16/14 1616  BNP 34.7    ProBNP (last 3 results) No results for input(s): PROBNP in the last 8760 hours.  CBG: No results for input(s): GLUCAP in the last 168 hours.  Radiological  Exams on Admission: Ct Abdomen Pelvis W Contrast  11/27/2014   CLINICAL DATA:  64 year old male with history of cholecystitis status post placement of percutaneous cholecystostomy tube. The tube was subsequently displaced. Patient having recurrent pain at the tube insertion site.  EXAM: CT ABDOMEN AND PELVIS WITH CONTRAST  TECHNIQUE: Multidetector CT imaging of the abdomen and pelvis was performed using the standard protocol following bolus administration of intravenous contrast.  CONTRAST:  43mL OMNIPAQUE IOHEXOL 300 MG/ML SOLN, 126mL OMNIPAQUE IOHEXOL 300 MG/ML SOLN  COMPARISON:  Most recent prior CT abdomen/ pelvis 11/19/2014  FINDINGS: Lower Chest: Centrilobular emphysema. Mild lower lobe atelectasis. Heart is within normal limits for size. No pericardial effusion. Small hiatal hernia.  Abdomen: Unremarkable CT appearance of the stomach, duodenum, spleen, adrenal glands and pancreas. Normal hepatic contour and morphology. Mild gallbladder distention. There is slight hyper enhancement and thickening of the gallbladder wall with mild inflammatory stranding in the pericholecystic fat. Additionally, there is increased prominence of the soft tissue tract at the site of the prior gallbladder drain. There is  small volume fluid tracking along the tracks with increased soft tissue thickening and inflammatory stranding.  Unremarkable appearance of the bilateral kidneys. No focal solid lesion, hydronephrosis or nephrolithiasis. 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 No evidence of obstruction or focal bowel wall thickening. Normal appendix in the right lower quadrant. The terminal ileum is unremarkable.  Pelvis: Unremarkable bladder, prostate gland and seminal vesicles. No free fluid or suspicious adenopathy.  Bones/Soft Tissues: No acute fracture or aggressive appearing lytic or blastic osseous lesion. Chronic bilateral L5 pars defects. No associated anterolisthesis.  Vascular: Atherosclerotic vascular disease without significant stenosis or aneurysmal dilatation.  IMPRESSION: 1. Developing recurrent gallbladder distention, wall thickening and hyper enhancement concerning for recurrent acute cholecystitis. 2. There is also progressive inflammatory stranding along the tract from the prior percutaneous cholecystostomy tube. The tract contains a small volume of fluid and communicates directly with the gallbladder lumen via a short transhepatic course. This is concerning for bile leaking into the tract. Query clinical history of bile leakage from the skin insertion site? Alternately, this could represent an abscess developing within the prior tube tract with secondary inflammatory stranding of the gallbladder. This is considered less likely. Nuclear medicine HIDA scan could further evaluate to confirm recurrent obstruction of the cystic duct.   Electronically Signed   By: Jacqulynn Cadet M.D.   On: 11/27/2014 01:05   Dg Chest Port 1 View  11/15/2014   CLINICAL DATA:  Patient is white states he had pneumonia with percutaneous drain that fell out. Drain was on right side of abdomen. Shortness of breath.  EXAM: PORTABLE CHEST - 1 VIEW  COMPARISON:  11/19/2014  FINDINGS: Patient slightly  rotated to the right. Lungs are adequately inflated with mild prominence of the perihilar markings suggesting mild vascular congestion. No focal lobar consolidation or effusion. Cardiomediastinal silhouette and remainder of the exam is unchanged.  IMPRESSION: Findings suggesting mild vascular congestion.   Electronically Signed   By: Marin Olp M.D.   On: 11/28/2014 22:05    EKG: Independently reviewed. Sinus tachycardia.  Assessment/Plan Principal Problem:   Sepsis Active Problems:   COPD (chronic obstructive pulmonary disease)   Cholecystitis   Acute respiratory failure with hypoxia   1. Sepsis from acute cholecystitis - on-call surgeon Dr. Excell Seltzer is aware of the patient's admission. At this time I have place patient nothing by mouth and we will continue with IV Zosyn.Vancomycin for now. Check pro-calcitonin  level follow blood cultures and lactate levels. There is also concern for biliary leak for which I have ordered height of scan. 2. Acute respiratory failure with hypoxia - I have ordered a repeat stat chest x-ray as there was concern for fluid overload. Check BNP and troponin. Continue with patient's home inhalers for COPD. On exam patient is not wheezing at this time. If patient's chest x-ray shows fluid overload may need Lasix if blood pressure allows. 3. COPD - presently not wheezing. See #2. Continue inhalers. 4. 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  Addendum - patient became more hypotensive and hypoxic and was transferred to ICU. I have consulted critical care who starting patient on Neo-Synephrine for sepsis. I have discussed with on-call surgeon Dr. Excell Seltzer who at this time is advised to get interventional radiology for percutaneous drainage of the gallbladder. I have discussed with Dr. Anselm Pancoast interventional radiologist who will be doing the percutaneous drainage. Check 2-D echo. I have discussed with oncoming hospitalist Dr. Darrick Meigs about the  patient.   DVT Prophylaxis SCDs.  Code Status: Full code.  Family Communication: Discussed with patient.  Disposition Plan: Admit to inpatient. Likely stay 3-4 days.    Thurza Kwiecinski N. [facility] Pager [phone].  If 7PM-7AM, please contact night-coverage [email or web address] Password [facility] 11/27/2014, 4:04 AM

## 2014-11-27 NOTE — Consult Note (Signed)
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 7.4 6.8  ALBUMIN 3.6 3.9 3.7 3.3*    TUMOR MARKERS: No results for input(s): AFPTM, CEA, CA199, CHROMGRNA in the last 8760 hours.  Assessment and Plan:  Chronic cholecystitis Perc drain out since 11/13/2014 Now with leukocytosis; fever; abd pain; N/V Scheduled now for new placement of drain  Risks and Benefits discussed with the patient's family, but not limited to bleeding, infection, gallbladder perforation, bile leak, sepsis or even death. All of the patient's questions were answered, patient is agreeable to proceed. Consent signed and in chart.    Thank you for this interesting consult.  I greatly enjoyed meeting Damarko Stitely Kampf and look forward to participating in their care.  Signed: Pavan Bring A 11/27/2014, 9:44 AM   I spent a total of 40 Minutes  in face to face in clinical consultation, greater than 50% of which was counseling/coordinating care for perc chole drain

## 2014-11-27 NOTE — Progress Notes (Signed)
eLink Physician-Brief Progress Note Patient Name: Allen Green DOB: [birth year] MRN: [id number]   Date of Service  11/27/2014  HPI/Events of Note  Ventilated patient. Need for stress ulcer prophylaxis.  eICU Interventions  Will order Protonix IV.      Intervention Category Intermediate Interventions: Best-practice therapies (e.g. DVT, beta blocker, etc.)  Gwendolen Hewlett Eugene 11/27/2014, 7:58 PM

## 2014-11-27 NOTE — Procedures (Signed)
Intubation Procedure Note Allen Green [id number] January 19, 1951  Procedure: Intubation Indications: Resp failure.  Procedure Details Consent: Risks of procedure as well as the alternatives and risks of each were explained to the (patient/caregiver).  Consent for procedure obtained. Time Out: Verified patient identification, verified procedure, site/side was marked, verified correct patient position, special equipment/implants available, medications/allergies/relevent history reviewed, required imaging and test results available.  Performed  MAC 4 Medications:  Fentanyl 100 mcg Etomidate 20 mg Versed 2mg  NMB 70 mg rocuronium    Evaluation Hemodynamic Status: Transient hypotension treated with pressors; O2 sats: stable throughout Patient's Current Condition: stable Complications: No apparent complications Patient did tolerate procedure well. Chest X-ray ordered to verify placement.  CXR: pending.   Allen Green Allen Green ACNP Allen Green PCCM Pager [phone] till 3 pm If no answer page [phone] 11/27/2014, 8:22 AM

## 2014-11-27 NOTE — Procedures (Signed)
Central Venous Catheter Insertion Procedure Note Allen Green [id number] 1951-01-21  Procedure: Insertion of Central Venous Catheter Indications: Drug and/or fluid administration  Procedure Details Consent: Risks of procedure as well as the alternatives and risks of each were explained to the (patient/caregiver).  Consent for procedure obtained. Time Out: Verified patient identification, verified procedure, site/side was marked, verified correct patient position, special equipment/implants available, medications/allergies/relevent history reviewed, required imaging and test results available.  Performed  Maximum sterile technique was used including antiseptics, cap, gloves, gown, hand hygiene, mask and sheet. Skin prep: Chlorhexidine; local anesthetic administered A antimicrobial bonded/coated triple lumen catheter was placed in the left internal jugular vein using the Seldinger technique. Ultrasound guidance used.Yes.   Catheter placed to 20 cm. Blood aspirated via all 3 ports and then flushed x 3. Line sutured x 2 and dressing applied.  Evaluation Blood flow good Complications: No apparent complications Patient did tolerate procedure well. Chest X-ray ordered to verify placement.  CXR: pending.  Allen Green Markhi Kleckner ACNP [name] PCCM Pager [phone] till 3 pm If no answer page [phone] 11/27/2014, 8:25 AM

## 2014-11-27 NOTE — Consult Note (Signed)
Reason for Consult:  Recurrent acute cholecystitis/shock Referring Physician: Rise Patience, MD  Allen Green is an 64 y.o. male.  HPI: Pt admitted 11/02/2013 with acute cholecystitis, significant COPD, with polycythemia, and right heart failure. Consult by [facility] and DR. Alva notes ongoing 2-3 PPD, O2 dependant for 3 years, only able to ambulate 30 feet without SOB. FEV1 48% in June 2012.  Additionally he has left knee issues, also limiting ambulation. DR. Fanny Skates discussed options and pt wanted to avoid surgery so it was opted to go forward with cholecystostomy.  He also encouraged smoking cessation. Drain was placed on 11/03/13, by DR. Hoss IR.  He was discharged by home on 11/05/13.  Dr. Excell Seltzer saw him on 11/23/13 with plans to come back in 3-4 weeks with catheter injection to assess cystic duct for patency. He returned to see DR. Hoxworth on 12/21/13, and drain was removed. He returned with acute cholecystitis on 01/24/14, and it was opted again to go with drain because of his severe COPD.  He was still smoking at that time.  Second drain was place on 01/25/14 by Dr. Pascal Lux, IR. Follow up visit to Dr. Excell Seltzer 02/19/14, with follow up after drain injection scheduled.  He has a drain tube exchange on 05/09/14, by IR, Dr. Kathlene Cote.  He was next seen for this 11/13/14 with accidental dislogment of the percutaneous drain, by Dr. Kathlene Cote and it was opted to leave it out because there was not evidence of recurrent cholecystitis.  We did not see further follow up by Dr. Excell Seltzer in [employer] system in our office after his 02/19/14 visit.  On 4/18 he was seen in the ED complaining of pain in the upper abdomen, but work up was unremarkable and he was sent home.  He returned 11/30/2014, with pain and hypotension. He was having pain and SOB lying down.  He has just completed treatment for pneumonia.   Work up in the ED shows hypertension on admit with a drop of BP.  Sats, 95% on [place] with RR in the 20's. TM 100.2.  PH  7.29/52.4 PCO2/PO2117/HCO3 24.9, WBC 23K.  CT scan shows:  Developing recurrent gallbladder distention, wall thickening and hyper enhancement concerning for recurrent acute cholecystitis.  There is also progressive inflammatory stranding along the tract from the prior percutaneous cholecystostomy tube. The tract contains a small volume of fluid and communicates directly with the gallbladder lumen via a short transhepatic course. This could represent an abscess developing within the prior tube tract with secondary inflammatory stranding of the gallbladder. This is considered less likely.  We are ask to see Pt was just intubated by CCM.   Past Medical History  Diagnosis Date  . COPD (chronic obstructive pulmonary disease)     Probable - long-standing tobacco abuse  . Chronic pain     Bilateral knees and shoulders  . GERD (gastroesophageal reflux disease)   . 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 [name]  . Pneumonia     January 2015  . Hepatitis     Hapatitis B 40 yrs ago    Past Surgical History  Procedure Laterality Date  . Right total knee arthroplasty  2003  . Left total knee arthroplasty  1998    "left knee replacement is broken" - 08/09/13    Family History  Problem Relation Age of Onset  . Diabetes type II Father   . Coronary artery disease Father     MI in his 69s  . Diabetes  Mother   . Diabetes Brother   . Diabetes Sister   . Asthma Sister     Social History:  reports that he has been smoking Cigarettes.  He started smoking about 52 years ago. He has a 100 pack-year smoking history. He has never used smokeless tobacco. He reports that he does not drink alcohol or use illicit drugs.  Allergies: No Known Allergies  Medications:  Prior to Admission:  Prescriptions prior to admission  Medication Sig Dispense Refill Last Dose  . albuterol (VENTOLIN HFA) 108 (90 BASE) MCG/ACT inhaler Inhale 2 puffs into the lungs every 4 (four) hours as needed for wheezing or shortness  of breath. 8 g 1 11/15/2014 at Unknown time  . benzonatate (TESSALON) 200 MG capsule Take 200 mg by mouth 3 (three) times daily as needed for cough.   Past Month at Unknown time  . DULERA 200-5 MCG/ACT AERO INHALE 2 PUFFS INTO THE LUNGS 2 (TWO) TIMES DAILY. 1 Inhaler 0 11/10/2014 at Unknown time  . tiotropium (SPIRIVA HANDIHALER) 18 MCG inhalation capsule Place 1 capsule (18 mcg total) into inhaler and inhale daily. 30 capsule 3 11/19/2014 at Unknown time  . albuterol (PROVENTIL) (2.5 MG/3ML) 0.083% nebulizer solution Take 3 mLs (2.5 mg total) by nebulization every 6 (six) hours as needed for wheezing or shortness of breath. (Patient not taking: Reported on 11/11/2014) 75 mL 12 Not Taking at Unknown time  . azithromycin (ZITHROMAX) 250 MG tablet 25m daily starting tomorrow (11/11/14), for 4 day (Patient not taking: Reported on 11/19/2014) 4 each 0   . chlorpheniramine (CHLOR-TRIMETON) 4 MG tablet Take 1 tablet (4 mg total) by mouth every 6 (six) hours as needed for allergies. (Patient not taking: Reported on 09/16/2014) 45 tablet 0 Completed Course at Unknown time  . fluticasone (FLONASE) 50 MCG/ACT nasal spray PLACE 2 SPRAYS INTO THE NOSE 2 (TWO) TIMES DAILY. (Patient not taking: Reported on 09/16/2014) 16 g 0 Not Taking at Unknown time  . mometasone-formoterol (DULERA) 200-5 MCG/ACT AERO Inhale 2 puffs into the lungs 2 (two) times daily. 13 g 3 11/18/2014 at Unknown time  . nicotine (NICODERM CQ - DOSED IN MG/24 HOURS) 21 mg/24hr patch Place 1 patch (21 mg total) onto the skin daily. (Patient not taking: Reported on 11/11/2014) 28 patch 0 Not Taking at Unknown time  . predniSONE (DELTASONE) 20 MG tablet 3 tabs po daily x 3 days, then 2 tabs x 3 days, then 1.5 tabs x 3 days, then 1 tab x 3 days, then 0.5 tabs x 3 days 27 tablet 0 11/18/2014 at Unknown time  . PRESCRIPTION MEDICATION Take 5 mLs by mouth every 4 (four) hours as needed (cough). Cough medication   Completed Course at Unknown time  . SPIRIVA  HANDIHALER 18 MCG inhalation capsule USE 1 PUFF DAILY (Patient not taking: Reported on 11/27/2014) 30 capsule 0    Scheduled: . etomidate      . fentaNYL      . lidocaine (cardiac) 100 mg/552m     . midazolam      . mometasone-formoterol  2 puff Inhalation BID  . piperacillin-tazobactam (ZOSYN)  IV  3.375 g Intravenous Q8H  . rocuronium      . sodium chloride  1,000 mL Intravenous Once  . sodium chloride  3 mL Intravenous Q12H  . succinylcholine      . tiotropium  18 mcg Inhalation Daily  . vancomycin  750 mg Intravenous Q12H   Continuous: . phenylephrine (NEO-SYNEPHRINE) Adult infusion  FHQ:[id number] **OR** acetaminophen, HYDROmorphone (DILAUDID) injection, ondansetron **OR** ondansetron (ZOFRAN) IV Anti-infectives    Start     Dose/Rate Route Frequency Ordered Stop   11/27/14 1700  vancomycin (VANCOCIN) IVPB 750 mg/150 ml premix     750 mg 150 mL/hr over 60 Minutes Intravenous Every 12 hours 11/27/14 0640     11/27/14 1000  piperacillin-tazobactam (ZOSYN) IVPB 3.375 g     3.375 g 12.5 mL/hr over 240 Minutes Intravenous Every 8 hours 11/27/14 0435     11/27/14 0130  vancomycin (VANCOCIN) 2,000 mg in sodium chloride 0.9 % 500 mL IVPB     2,000 mg 250 mL/hr over 120 Minutes Intravenous  Once 11/27/14 0120 11/27/14 0643   11/27/14 0130  piperacillin-tazobactam (ZOSYN) IVPB 3.375 g     3.375 g 100 mL/hr over 30 Minutes Intravenous  Once 11/27/14 0120 11/27/14 0227      Results for orders placed or performed during the hospital encounter of 11/16/2014 (from the past 48 hour(s))  Comprehensive metabolic panel     Status: Abnormal   Collection Time: 11/29/2014  9:54 PM  Result Value Ref Range   Sodium 139 135 - 145 mmol/L   Potassium 4.3 3.5 - 5.1 mmol/L   Chloride 101 96 - 112 mmol/L   CO2 29 19 - 32 mmol/L   Glucose, Bld 114 (H) 70 - 99 mg/dL   BUN 22 6 - 23 mg/dL   Creatinine, Ser 1.01 0.50 - 1.35 mg/dL   Calcium 9.1 8.4 - 10.5 mg/dL   Total Protein 7.4 6.0 - 8.3 g/dL    Albumin 3.7 3.5 - 5.2 g/dL   AST 18 0 - 37 U/L   ALT 21 0 - 53 U/L   Alkaline Phosphatase 72 39 - 117 U/L   Total Bilirubin 0.4 0.3 - 1.2 mg/dL   GFR calc non Af Amer 77 (L) >90 mL/min   GFR calc Af Amer 89 (L) >90 mL/min    Comment: (NOTE) The eGFR has been calculated using the CKD EPI equation. This calculation has not been validated in all clinical situations. eGFR's persistently <90 mL/min signify possible Chronic Kidney Disease.    Anion gap 9 5 - 15  CBC with Differential     Status: Abnormal   Collection Time: 11/05/2014  9:54 PM  Result Value Ref Range   WBC 22.3 (H) 4.0 - 10.5 K/uL   RBC 4.76 4.22 - 5.81 MIL/uL   Hemoglobin 15.2 13.0 - 17.0 g/dL   HCT 44.9 39.0 - 52.0 %   MCV 94.3 78.0 - 100.0 fL   MCH 31.9 26.0 - 34.0 pg   MCHC 33.9 30.0 - 36.0 g/dL   RDW 14.9 11.5 - 15.5 %   Platelets 398 150 - 400 K/uL   Neutrophils Relative % 80 (H) 43 - 77 %   Neutro Abs 17.8 (H) 1.7 - 7.7 K/uL   Lymphocytes Relative 11 (L) 12 - 46 %   Lymphs Abs 2.5 0.7 - 4.0 K/uL   Monocytes Relative 7 3 - 12 %   Monocytes Absolute 1.5 (H) 0.1 - 1.0 K/uL   Eosinophils Relative 2 0 - 5 %   Eosinophils Absolute 0.4 0.0 - 0.7 K/uL   Basophils Relative 0 0 - 1 %   Basophils Absolute 0.1 0.0 - 0.1 K/uL  I-Stat CG4 Lactic Acid, ED     Status: None   Collection Time: 11/25/2014 10:02 PM  Result Value Ref Range   Lactic Acid, Venous 1.48 0.5 - 2.0  mmol/L  Troponin I     Status: None   Collection Time: 11/27/14  3:20 AM  Result Value Ref Range   Troponin I <0.03 <0.031 ng/mL    Comment:        NO INDICATION OF MYOCARDIAL INJURY.   Lactic acid, plasma     Status: None   Collection Time: 11/27/14  3:20 AM  Result Value Ref Range   Lactic Acid, Venous 1.1 0.5 - 2.0 mmol/L  Brain natriuretic peptide     Status: None   Collection Time: 11/27/14  3:20 AM  Result Value Ref Range   B Natriuretic Peptide 31.0 0.0 - 100.0 pg/mL  Hepatic function panel     Status: Abnormal   Collection Time:  11/27/14  3:20 AM  Result Value Ref Range   Total Protein 6.8 6.0 - 8.3 g/dL   Albumin 3.3 (L) 3.5 - 5.2 g/dL   AST 18 0 - 37 U/L   ALT 20 0 - 53 U/L   Alkaline Phosphatase 70 39 - 117 U/L   Total Bilirubin 0.8 0.3 - 1.2 mg/dL   Bilirubin, Direct 0.2 0.0 - 0.5 mg/dL   Indirect Bilirubin 0.6 0.3 - 0.9 mg/dL  Basic metabolic panel     Status: Abnormal   Collection Time: 11/27/14  3:20 AM  Result Value Ref Range   Sodium 136 135 - 145 mmol/L   Potassium 4.3 3.5 - 5.1 mmol/L   Chloride 103 96 - 112 mmol/L   CO2 26 19 - 32 mmol/L   Glucose, Bld 122 (H) 70 - 99 mg/dL   BUN 19 6 - 23 mg/dL   Creatinine, Ser 0.90 0.50 - 1.35 mg/dL   Calcium 8.5 8.4 - 10.5 mg/dL   GFR calc non Af Amer 89 (L) >90 mL/min   GFR calc Af Amer >90 >90 mL/min    Comment: (NOTE) The eGFR has been calculated using the CKD EPI equation. This calculation has not been validated in all clinical situations. eGFR's persistently <90 mL/min signify possible Chronic Kidney Disease.    Anion gap 7 5 - 15  CBC with Differential/Platelet     Status: Abnormal   Collection Time: 11/27/14  3:20 AM  Result Value Ref Range   WBC 23.0 (H) 4.0 - 10.5 K/uL   RBC 4.67 4.22 - 5.81 MIL/uL   Hemoglobin 14.6 13.0 - 17.0 g/dL   HCT 44.5 39.0 - 52.0 %   MCV 95.3 78.0 - 100.0 fL   MCH 31.3 26.0 - 34.0 pg   MCHC 32.8 30.0 - 36.0 g/dL   RDW 15.0 11.5 - 15.5 %   Platelets 365 150 - 400 K/uL   Neutrophils Relative % 87 (H) 43 - 77 %   Neutro Abs 20.0 (H) 1.7 - 7.7 K/uL   Lymphocytes Relative 9 (L) 12 - 46 %   Lymphs Abs 2.0 0.7 - 4.0 K/uL   Monocytes Relative 3 3 - 12 %   Monocytes Absolute 0.7 0.1 - 1.0 K/uL   Eosinophils Relative 1 0 - 5 %   Eosinophils Absolute 0.2 0.0 - 0.7 K/uL   Basophils Relative 0 0 - 1 %   Basophils Absolute 0.0 0.0 - 0.1 K/uL  Procalcitonin - Baseline     Status: None   Collection Time: 11/27/14  3:20 AM  Result Value Ref Range   Procalcitonin 0.14 ng/mL    Comment:        Interpretation: PCT  (Procalcitonin) <= 0.5 ng/mL: Systemic infection (sepsis) is  not likely. Local bacterial infection is possible. (NOTE)         ICU PCT Algorithm               Non ICU PCT Algorithm    ----------------------------     ------------------------------         PCT < 0.25 ng/mL                 PCT < 0.1 ng/mL     Stopping of antibiotics            Stopping of antibiotics       strongly encouraged.               strongly encouraged.    ----------------------------     ------------------------------       PCT level decrease by               PCT < 0.25 ng/mL       >= 80% from peak PCT       OR PCT 0.25 - 0.5 ng/mL          Stopping of antibiotics                                             encouraged.     Stopping of antibiotics           encouraged.    ----------------------------     ------------------------------       PCT level decrease by              PCT >= 0.25 ng/mL       < 80% from peak PCT        AND PCT >= 0.5 ng/mL            Continuin g antibiotics                                              encouraged.       Continuing antibiotics            encouraged.    ----------------------------     ------------------------------     PCT level increase compared          PCT > 0.5 ng/mL         with peak PCT AND          PCT >= 0.5 ng/mL             Escalation of antibiotics                                          strongly encouraged.      Escalation of antibiotics        strongly encouraged.   Protime-INR     Status: None   Collection Time: 11/27/14  5:25 AM  Result Value Ref Range   Prothrombin Time 14.1 11.6 - 15.2 seconds   INR 1.08 0.00 - 1.49  APTT     Status: None   Collection Time: 11/27/14  5:25 AM  Result Value Ref Range   aPTT 30 24 - 37 seconds    Ct Abdomen Pelvis  W Contrast  11/27/2014   CLINICAL DATA:  64 year old male with history of cholecystitis status post placement of percutaneous cholecystostomy tube. The tube was subsequently displaced. Patient having  recurrent pain at the tube insertion site.  EXAM: CT ABDOMEN AND PELVIS WITH CONTRAST  TECHNIQUE: Multidetector CT imaging of the abdomen and pelvis was performed using the standard protocol following bolus administration of intravenous contrast.  CONTRAST:  64m OMNIPAQUE IOHEXOL 300 MG/ML SOLN, 1066mOMNIPAQUE IOHEXOL 300 MG/ML SOLN  COMPARISON:  Most recent prior CT abdomen/ pelvis 11/19/2014  FINDINGS: Lower Chest: Centrilobular emphysema. Mild lower lobe atelectasis. Heart is within normal limits for size. No pericardial effusion. Small hiatal hernia.  Abdomen: Unremarkable CT appearance of the stomach, duodenum, spleen, adrenal glands and pancreas. Normal hepatic contour and morphology. Mild gallbladder distention. There is slight hyper enhancement and thickening of the gallbladder wall with mild inflammatory stranding in the pericholecystic fat. Additionally, there is increased prominence of the soft tissue tract at the site of the prior gallbladder drain. There is small volume fluid tracking along the tracks with increased soft tissue thickening and inflammatory stranding.  Unremarkable appearance of the bilateral kidneys. No focal solid lesion, hydronephrosis or nephrolithiasis. 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 No evidence of obstruction or focal bowel wall thickening. Normal appendix in the right lower quadrant. The terminal ileum is unremarkable.  Pelvis: Unremarkable bladder, prostate gland and seminal vesicles. No free fluid or suspicious adenopathy.  Bones/Soft Tissues: No acute fracture or aggressive appearing lytic or blastic osseous lesion. Chronic bilateral L5 pars defects. No associated anterolisthesis.  Vascular: Atherosclerotic vascular disease without significant stenosis or aneurysmal dilatation.  IMPRESSION: 1. Developing recurrent gallbladder distention, wall thickening and hyper enhancement concerning for recurrent acute cholecystitis. 2. There is  also progressive inflammatory stranding along the tract from the prior percutaneous cholecystostomy tube. The tract contains a small volume of fluid and communicates directly with the gallbladder lumen via a short transhepatic course. This is concerning for bile leaking into the tract. Query clinical history of bile leakage from the skin insertion site? Alternately, this could represent an abscess developing within the prior tube tract with secondary inflammatory stranding of the gallbladder. This is considered less likely. Nuclear medicine HIDA scan could further evaluate to confirm recurrent obstruction of the cystic duct.   Electronically Signed   By: HeJacqulynn Cadet.D.   On: 11/27/2014 01:05   Dg Chest Port 1 View  11/27/2014   CLINICAL DATA:  Acute onset of shortness of breath. Initial encounter.  EXAM: PORTABLE CHEST - 1 VIEW  COMPARISON:  Chest radiograph performed 11/11/2014  FINDINGS: The lungs are well-aerated. Vascular congestion is noted, with mildly increased interstitial markings, possibly reflecting mild interstitial edema. As noted on prior CT, pneumonia could have a similar appearance. No pleural effusion or pneumothorax is seen.  The cardiomediastinal silhouette is within normal limits. No acute osseous abnormalities are seen.  IMPRESSION: Vascular congestion, with mildly increased interstitial markings, possibly reflecting mild interstitial edema. As noted on prior CT, pneumonia could have a similar appearance.   Electronically Signed   By: JeGarald Balding.D.   On: 11/27/2014 06:01   Dg Chest Port 1 View  11/12/2014   CLINICAL DATA:  Patient is white states he had pneumonia with percutaneous drain that fell out. Drain was on right side of abdomen. Shortness of breath.  EXAM: PORTABLE CHEST - 1 VIEW  COMPARISON:  11/19/2014  FINDINGS: Patient slightly rotated to the right. Lungs are adequately  inflated with mild prominence of the perihilar markings suggesting mild vascular congestion. No  focal lobar consolidation or effusion. Cardiomediastinal silhouette and remainder of the exam is unchanged.  IMPRESSION: Findings suggesting mild vascular congestion.   Electronically Signed   By: Marin Olp M.D.   On: 11/21/2014 22:05    Review of Systems  Unable to perform ROS: intubated   Blood pressure 98/63, pulse 127, temperature 98.4 F (36.9 C), temperature source Oral, resp. rate 22, SpO2 95 %. Physical Exam  Constitutional: He appears distressed.  Obese male on vent, sedated, tachycardic  HENT:  Head: Normocephalic and atraumatic.  Eyes: Conjunctivae are normal. Right eye exhibits no discharge. Left eye exhibits no discharge. No scleral icterus.  Neck: No JVD present. No tracheal deviation present.  Cardiovascular: Normal rate, regular rhythm and normal heart sounds.   No murmur heard. Respiratory: No stridor. He is in respiratory distress. He has rales.  GI: He exhibits distension. There is tenderness (tender red raised area right, site of prior drain). There is guarding.  Musculoskeletal: He exhibits no edema.  Neurological:  Sedated on vent.  Reacts any time you touch drain site on right abdomen  Skin: Skin is warm. No rash noted. He is diaphoretic. No erythema. There is pallor.    Assessment/Plan: Recurrent acute cholecystitis Questionable biloma from prior drain Severe COPD Polycythemia from hypoxia Hepatitis B hx. Pneumonia Bilateral knee arthroplasties with limited mobility Morbid obesity   Plan:  We are planning on repeat drain, IR will look at him and make final decision.  Antibiotic ordered.   , 11/27/2014, 7:29 AM

## 2014-11-27 NOTE — Progress Notes (Signed)
  Echocardiogram 2D Echocardiogram has been performed.  Allen Green 11/27/2014, 12:15 PM

## 2014-11-27 NOTE — Consult Note (Signed)
PULMONARY / CRITICAL CARE MEDICINE   Name: Allen Green MRN: [id number] DOB: 10-26-1950    ADMISSION DATE:  11/13/2014 CONSULTATION DATE: 4/26  REFERRING MD :  Triad  CHIEF COMPLAINT:  SOB  INITIAL PRESENTATION: SOB/Abd pain  STUDIES:    SIGNIFICANT EVENTS: 4/26 intubated   HISTORY OF PRESENT ILLNESS:    64 yo WM long time smoker > 2 PPD(followed by [place] [facility]) severe obstructive lung disease by PFT(read by Byrum) who continues to smoke who had IR placed chole drain palced 11/04/14. The drain fell out 4/12 and no new drain was placed and he presents to [facility] ED 4/25 with Rt flank abd pain, fever, hypotension. CT abd with fluid collection. Admitted to floor but decompensated during the am of 4/26 and pccm asked to evaluate. He was in acute resp distress, cool, clammy and requiring pressor support. We elected to intubate , place cvl, obtain surgical consult. 2 d and CE ordered for possible cardiac component. Abx for suspected abd source and to cover for ? Pna. PCCM will assume care at this time.  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  has past surgical history that includes Right total knee arthroplasty (2003) and Left total knee arthroplasty (1998). Prior to Admission medications   Medication Sig Start Date End Date Taking? Authorizing Provider  albuterol (VENTOLIN HFA) 108 (90 BASE) MCG/ACT inhaler Inhale 2 puffs into the lungs every 4 (four) hours as needed for wheezing or shortness of breath. 10/09/14  Yes Collene Gobble, MD  benzonatate (TESSALON) 200 MG capsule Take 200 mg by mouth 3 (three) times daily as needed for cough.   Yes Historical Provider, MD  DULERA 200-5 MCG/ACT AERO INHALE 2 PUFFS INTO THE LUNGS 2 (TWO) TIMES DAILY. 11/12/14  Yes Collene Gobble, MD  tiotropium (SPIRIVA HANDIHALER) 18 MCG  inhalation capsule Place 1 capsule (18 mcg total) into inhaler and inhale daily. 11/11/14  Yes Ernestina Patches, MD  albuterol (PROVENTIL) (2.5 MG/3ML) 0.083% nebulizer solution Take 3 mLs (2.5 mg total) by nebulization every 6 (six) hours as needed for wheezing or shortness of breath. Patient not taking: Reported on 11/11/2014 09/18/14   Caren Griffins, MD  azithromycin (ZITHROMAX) 250 MG tablet 250mg  daily starting tomorrow (11/11/14), for 4 day Patient not taking: Reported on 11/19/2014 11/11/14   Ernestina Patches, MD  chlorpheniramine (CHLOR-TRIMETON) 4 MG tablet Take 1 tablet (4 mg total) by mouth every 6 (six) hours as needed for allergies. Patient not taking: Reported on 09/16/2014 04/18/14   Collene Gobble, MD  fluticasone (FLONASE) 50 MCG/ACT nasal spray PLACE 2 SPRAYS INTO THE NOSE 2 (TWO) TIMES DAILY. Patient not taking: Reported on 09/16/2014 07/24/14   Collene Gobble, MD  mometasone-formoterol [facility]) 200-5 MCG/ACT AERO Inhale 2 puffs into the lungs 2 (two) times daily. 11/11/14   Ernestina Patches, MD  nicotine (NICODERM CQ - DOSED IN MG/24 HOURS) 21 mg/24hr patch Place 1 patch (21 mg total) onto the skin daily. Patient not taking: Reported on 11/11/2014 09/18/14   Caren Griffins, MD  predniSONE (DELTASONE) 20 MG tablet 3 tabs po daily x 3 days, then 2 tabs x 3 days, then 1.5 tabs x 3 days, then 1 tab x 3 days, then 0.5 tabs x 3 days 11/11/14   Ernestina Patches, MD  PRESCRIPTION MEDICATION Take 5 mLs by mouth every 4 (four) hours  as needed (cough). Cough medication    Historical Provider, MD  SPIRIVA HANDIHALER 18 MCG inhalation capsule USE 1 PUFF DAILY Patient not taking: Reported on 11/27/2014 11/21/14   Collene Gobble, MD   No Known Allergies  FAMILY HISTORY:  has no family status information on file.  SOCIAL HISTORY:  reports that he has been smoking Cigarettes.  He started smoking about 52 years ago. He has a 100 pack-year smoking history. He has never used smokeless tobacco. He reports that he  does not drink alcohol or use illicit drugs.  REVIEW OF SYSTEMS:  NA  SUBJECTIVE:   VITAL SIGNS: Temp:  [98.1 F (36.7 C)-100.3 F (37.9 C)] 98.1 F (36.7 C) (04/26 0800) Pulse Rate:  [113-143] 127 (04/26 0616) Resp:  [20-22] 22 (04/26 0509) BP: (64-158)/(43-108) 98/63 mmHg (04/26 0633) SpO2:  [90 %-95 %] 95 % (04/26 0624) FiO2 (%):  [100 %] 100 % (04/26 0800) HEMODYNAMICS:   VENTILATOR SETTINGS: Vent Mode:  [-] PRVC FiO2 (%):  [100 %] 100 % Set Rate:  [14 bmp] 14 bmp Vt Set:  [600 mL] 600 mL PEEP:  [5 cmH20] 5 cmH20 INTAKE / OUTPUT:  Intake/Output Summary (Last 24 hours) at 11/27/14 0835 Last data filed at 11/27/14 0509  Gross per 24 hour  Intake      0 ml  Output    450 ml  Net   -450 ml    PHYSICAL EXAMINATION: General:  MOWM in acute distress prior to intubation Neuro: Dull affect HEENT: poor dentition, no neck, PERL Cardiovascular:  HSD Lungs:  Decrease air movement.base crackles  Abdomen:  Obese, rt flank tender and red Musculoskeletal: 0ntact Skin:cool clammy  LABS:  CBC  Recent Labs Lab 11/25/2014 2154 11/27/14 0320  WBC 22.3* 23.0*  HGB 15.2 14.6  HCT 44.9 44.5  PLT 398 365   Coag's  Recent Labs Lab 11/27/14 0525  APTT 30  INR 1.08   BMET  Recent Labs Lab 11/15/2014 2154 11/27/14 0320  NA 139 136  K 4.3 4.3  CL 101 103  CO2 29 26  BUN 22 19  CREATININE 1.01 0.90  GLUCOSE 114* 122*   Electrolytes  Recent Labs Lab 11/16/2014 2154 11/27/14 0320  CALCIUM 9.1 8.5   Sepsis Markers  Recent Labs Lab 11/11/2014 2202 11/27/14 0320  LATICACIDVEN 1.48 1.1  PROCALCITON  --  0.14   ABG  Recent Labs Lab 11/27/14 0656  PHART 7.299*  PCO2ART 52.4*  PO2ART 117*   Liver Enzymes  Recent Labs Lab 11/10/2014 2154 11/27/14 0320  AST 18 18  ALT 21 20  ALKPHOS 72 70  BILITOT 0.4 0.8  ALBUMIN 3.7 3.3*   Cardiac Enzymes  Recent Labs Lab 11/27/14 0320  TROPONINI <0.03   Glucose  Recent Labs Lab 11/27/14 0744  GLUCAP  120*    Imaging Dg Chest Port 1 View  11/06/2014   CLINICAL DATA:  Patient is white states he had pneumonia with percutaneous drain that fell out. Drain was on right side of abdomen. Shortness of breath.  EXAM: PORTABLE CHEST - 1 VIEW  COMPARISON:  11/19/2014  FINDINGS: Patient slightly rotated to the right. Lungs are adequately inflated with mild prominence of the perihilar markings suggesting mild vascular congestion. No focal lobar consolidation or effusion. Cardiomediastinal silhouette and remainder of the exam is unchanged.  IMPRESSION: Findings suggesting mild vascular congestion.   Electronically Signed   By: Marin Olp M.D.   On: 11/24/2014 22:05     ASSESSMENT / PLAN:  PULMONARY OETT 4/26(PCCM)>> A: Hypoxia Shock Severe COPD Tobacco abuse PULMONARY FUNCTON TEST 01/07/2011  FVC 4.39  FEV1 1.61  FEV1/FVC 36.7  FVC % Predicted 92  FEV % Predicted 48  FeF 25-75 .39  FeF 25-75 % Predicted 3.2        P:   Vent bundle BD's Wean FIO2 Tracheal aspirate  CARDIOVASCULAR CVL 4/26 l i j cvl>> A:  Shock appears cardiogenic but may be combination with sepsis P:  Pressor support !2 LEAD ce X 3 2 d echo May need cards consult in future, little they can do at this point.  RENAL  A: No acute issue P:     GASTROINTESTINAL A:   Cholecystis Recent IR drain 4/3->4/12  P:   Abx as noted Surgical consult May need drain replaced if not taken to surgery  HEMATOLOGIC A:   Chronic polycythemia  P:  Follow H/H  INFECTIOUS A:   Presumed abd source Recent Pna Dual coverage P:   BCx2  4/26>> UC  4/26>> Sputum 4/26>> Abx:  4/26 vanc>> 4/26 zoysn>>  ENDOCRINE A:   No acute issue P:   SSI if needed  NEUROLOGIC A:   Intact prior to intubation P:   RASS goal: -1 Start sedation protocol 4/26   FAMILY  - Updates:   - Inter-disciplinary family meet or Palliative Care meeting due by:  day 7    TODAY'S SUMMARY:   64 yo WM long  time smoker > 2 PPD(followed by [place] [facility]) severe obstructive lung disease by PFT(read by Byrum) who continues to smoke who had IR placed chole drain palced 11/04/14. The drain fell out 4/12 and no new drain was placed and he presents to [facility] ED 4/25 with Rt flank abd pain, fever, hypotension. CT abd with fluid collection. Admitted to floor but decompensated during the am of 4/26 and pccm asked to evaluate. He was in acute resp distress, cool, clammy and requiring pressor support. We elected to intubate , place cvl, obtain surgical consult. 2 d and CE ordered for possible cardiac component. Abx for suspected abd source and to cover for ? Pna. PCCM will assume care at this time.   [name] Sandford Diop ACNP [name] PCCM Pager [phone] till 3 pm If no answer page [phone] 11/27/2014, 8:57 AM

## 2014-11-28 ENCOUNTER — Inpatient Hospital Stay (HOSPITAL_COMMUNITY): Payer: Medicare Other

## 2014-11-28 DIAGNOSIS — K810 Acute cholecystitis: Secondary | ICD-10-CM

## 2014-11-28 DIAGNOSIS — J9621 Acute and chronic respiratory failure with hypoxia: Secondary | ICD-10-CM

## 2014-11-28 DIAGNOSIS — A419 Sepsis, unspecified organism: Secondary | ICD-10-CM | POA: Diagnosis present

## 2014-11-28 DIAGNOSIS — I959 Hypotension, unspecified: Secondary | ICD-10-CM

## 2014-11-28 DIAGNOSIS — N179 Acute kidney failure, unspecified: Secondary | ICD-10-CM

## 2014-11-28 DIAGNOSIS — N19 Unspecified kidney failure: Secondary | ICD-10-CM | POA: Diagnosis present

## 2014-11-28 LAB — CBC
HCT: 45.3 % (ref 39.0–52.0)
HEMOGLOBIN: 14.7 g/dL (ref 13.0–17.0)
MCH: 31.1 pg (ref 26.0–34.0)
MCHC: 32.5 g/dL (ref 30.0–36.0)
MCV: 96 fL (ref 78.0–100.0)
Platelets: 308 10*3/uL (ref 150–400)
RBC: 4.72 MIL/uL (ref 4.22–5.81)
RDW: 14.7 % (ref 11.5–15.5)
WBC: 37.3 10*3/uL — AB (ref 4.0–10.5)

## 2014-11-28 LAB — URINALYSIS, ROUTINE W REFLEX MICROSCOPIC
Bilirubin Urine: NEGATIVE
GLUCOSE, UA: NEGATIVE mg/dL
KETONES UR: NEGATIVE mg/dL
Nitrite: NEGATIVE
Protein, ur: 100 mg/dL — AB
Specific Gravity, Urine: 1.022 (ref 1.005–1.030)
Urobilinogen, UA: 0.2 mg/dL (ref 0.0–1.0)
pH: 5 (ref 5.0–8.0)

## 2014-11-28 LAB — BASIC METABOLIC PANEL
ANION GAP: 8 (ref 5–15)
ANION GAP: 10 (ref 5–15)
BUN: 39 mg/dL — AB (ref 6–23)
BUN: 42 mg/dL — ABNORMAL HIGH (ref 6–23)
CALCIUM: 7.7 mg/dL — AB (ref 8.4–10.5)
CO2: 24 mmol/L (ref 19–32)
CO2: 23 mmol/L (ref 19–32)
CREATININE: 4.22 mg/dL — AB (ref 0.50–1.35)
Calcium: 8.1 mg/dL — ABNORMAL LOW (ref 8.4–10.5)
Chloride: 102 mmol/L (ref 96–112)
Chloride: 103 mmol/L (ref 96–112)
Creatinine, Ser: 4.5 mg/dL — ABNORMAL HIGH (ref 0.50–1.35)
GFR calc non Af Amer: 14 mL/min — ABNORMAL LOW (ref >90)
GFR calc non Af Amer: 13 mL/min — ABNORMAL LOW (ref >90)
GFR, EST AFRICAN AMERICAN: 16 mL/min — AB (ref >90)
GFR, EST AFRICAN AMERICAN: 15 mL/min — AB (ref >90)
Glucose, Bld: 97 mg/dL (ref 70–99)
Glucose, Bld: 129 mg/dL — ABNORMAL HIGH (ref 70–99)
POTASSIUM: 6 mmol/L — AB (ref 3.5–5.1)
Potassium: 6.6 mmol/L (ref 3.5–5.1)
Sodium: 134 mmol/L — ABNORMAL LOW (ref 135–145)
Sodium: 136 mmol/L (ref 135–145)

## 2014-11-28 LAB — CARBOXYHEMOGLOBIN
Carboxyhemoglobin: 1 % (ref 0.5–1.5)
METHEMOGLOBIN: 0.8 % (ref 0.0–1.5)
O2 Saturation: 77.2 %
Total hemoglobin: 15.7 g/dL (ref 13.5–18.0)

## 2014-11-28 LAB — RENAL FUNCTION PANEL
ALBUMIN: 2.6 g/dL — AB (ref 3.5–5.2)
Anion gap: 8 (ref 5–15)
BUN: 36 mg/dL — ABNORMAL HIGH (ref 6–23)
CALCIUM: 7.6 mg/dL — AB (ref 8.4–10.5)
CO2: 24 mmol/L (ref 19–32)
CREATININE: 4.07 mg/dL — AB (ref 0.50–1.35)
Chloride: 103 mmol/L (ref 96–112)
GFR calc Af Amer: 17 mL/min — ABNORMAL LOW (ref >90)
GFR calc non Af Amer: 14 mL/min — ABNORMAL LOW (ref >90)
Glucose, Bld: 98 mg/dL (ref 70–99)
Phosphorus: 5.9 mg/dL — ABNORMAL HIGH (ref 2.3–4.6)
Potassium: 6 mmol/L — ABNORMAL HIGH (ref 3.5–5.1)
Sodium: 135 mmol/L (ref 135–145)

## 2014-11-28 LAB — URINE MICROSCOPIC-ADD ON

## 2014-11-28 LAB — CREATININE, URINE, RANDOM: Creatinine, Urine: 136.45 mg/dL

## 2014-11-28 LAB — GLUCOSE, CAPILLARY
GLUCOSE-CAPILLARY: 105 mg/dL — AB (ref 70–99)
GLUCOSE-CAPILLARY: 190 mg/dL — AB (ref 70–99)
Glucose-Capillary: 126 mg/dL — ABNORMAL HIGH (ref 70–99)

## 2014-11-28 LAB — LACTIC ACID, PLASMA: LACTIC ACID, VENOUS: 1.9 mmol/L (ref 0.5–2.0)

## 2014-11-28 LAB — APTT: APTT: 38 s — AB (ref 24–37)

## 2014-11-28 LAB — SODIUM, URINE, RANDOM: Sodium, Ur: 51 mmol/L

## 2014-11-28 LAB — PROTIME-INR
INR: 1.36 (ref 0.00–1.49)
Prothrombin Time: 16.9 seconds — ABNORMAL HIGH (ref 11.6–15.2)

## 2014-11-28 LAB — TROPONIN I: Troponin I: <0.03 ng/mL (ref <0.031)

## 2014-11-28 MED ORDER — DEXTROSE 50 % IV SOLN
INTRAVENOUS | Status: AC
  Filled 2014-11-28: qty 50

## 2014-11-28 MED ORDER — HEPARIN SODIUM (PORCINE) 1000 UNIT/ML DIALYSIS
1000.0000 [IU] | INTRAMUSCULAR | Status: DC | PRN
  Filled 2014-11-28 (×2): qty 6

## 2014-11-28 MED ORDER — CIPROFLOXACIN IN D5W 400 MG/200ML IV SOLN
400.0000 mg | Freq: Two times a day (BID) | INTRAVENOUS | Status: DC
  Administered 2014-11-28 – 2014-12-02 (×8): 400 mg via INTRAVENOUS
  Filled 2014-11-28 (×9): qty 200

## 2014-11-28 MED ORDER — DEXTROSE 50 % IV SOLN
50.0000 mL | Freq: Once | INTRAVENOUS | Status: AC
  Administered 2014-11-28: 50 mL via INTRAVENOUS

## 2014-11-28 MED ORDER — PRISMASOL BGK 4/2.5 32-4-2.5 MEQ/L IV SOLN
INTRAVENOUS | Status: DC
  Administered 2014-11-28 – 2014-11-30 (×5): via INTRAVENOUS_CENTRAL
  Filled 2014-11-28 (×7): qty 5000

## 2014-11-28 MED ORDER — INSULIN ASPART 100 UNIT/ML ~~LOC~~ SOLN
10.0000 [IU] | Freq: Once | SUBCUTANEOUS | Status: AC
  Administered 2014-11-28: 10 [IU] via SUBCUTANEOUS

## 2014-11-28 MED ORDER — HEPARIN (PORCINE) 2000 UNITS/L FOR CRRT: INTRAVENOUS_CENTRAL | Status: DC | PRN

## 2014-11-28 MED ORDER — SODIUM POLYSTYRENE SULFONATE 15 GM/60ML PO SUSP
30.0000 g | Freq: Once | ORAL | Status: AC
  Administered 2014-11-28: 30 g
  Filled 2014-11-28: qty 120

## 2014-11-28 MED ORDER — SODIUM CHLORIDE 0.9 % IV SOLN
1.0000 g | Freq: Once | INTRAVENOUS | Status: AC
  Administered 2014-11-28: 1 g via INTRAVENOUS

## 2014-11-28 MED ORDER — SODIUM CHLORIDE 0.9 % IV SOLN
INTRAVENOUS | Status: DC
  Administered 2014-11-28: 10:00:00 via INTRAVENOUS
  Administered 2014-11-28: 1000 mL via INTRAVENOUS
  Administered 2014-11-29 – 2014-11-30 (×2): via INTRAVENOUS

## 2014-11-28 MED ORDER — PRISMASOL BGK 4/2.5 32-4-2.5 MEQ/L IV SOLN
INTRAVENOUS | Status: DC
  Administered 2014-11-28: 18:00:00 via INTRAVENOUS_CENTRAL
  Filled 2014-11-28 (×3): qty 5000

## 2014-11-28 MED ORDER — MIDAZOLAM HCL 2 MG/2ML IJ SOLN
INTRAMUSCULAR | Status: AC
  Administered 2014-11-28: 2 mg via INTRAVENOUS
  Filled 2014-11-28: qty 2

## 2014-11-28 MED ORDER — MIDAZOLAM HCL 2 MG/2ML IJ SOLN
2.0000 mg | Freq: Once | INTRAMUSCULAR | Status: AC
  Administered 2014-11-28: 2 mg via INTRAVENOUS

## 2014-11-28 MED ORDER — PIPERACILLIN-TAZOBACTAM IN DEX 2-0.25 GM/50ML IV SOLN
2.2500 g | Freq: Three times a day (TID) | INTRAVENOUS | Status: DC
  Filled 2014-11-28: qty 50

## 2014-11-28 MED ORDER — PIPERACILLIN-TAZOBACTAM 3.375 G IVPB 30 MIN
3.3750 g | Freq: Four times a day (QID) | INTRAVENOUS | Status: DC
  Administered 2014-11-28 – 2014-11-30 (×7): 3.375 g via INTRAVENOUS
  Filled 2014-11-28 (×14): qty 50

## 2014-11-28 MED ORDER — SODIUM CHLORIDE 0.9 % IV BOLUS (SEPSIS)
1000.0000 mL | Freq: Once | INTRAVENOUS | Status: AC
  Administered 2014-11-28: 1000 mL via INTRAVENOUS

## 2014-11-28 MED ORDER — SODIUM CHLORIDE 0.9 % IV SOLN
INTRAVENOUS | Status: AC
  Administered 2014-11-28: 1 g via INTRAVENOUS
  Filled 2014-11-28: qty 10

## 2014-11-28 MED ORDER — PRISMASOL BGK 4/2.5 32-4-2.5 MEQ/L IV SOLN
INTRAVENOUS | Status: DC
  Administered 2014-11-28 – 2014-11-30 (×12): via INTRAVENOUS_CENTRAL
  Filled 2014-11-28 (×18): qty 5000

## 2014-11-28 NOTE — Progress Notes (Signed)
INITIAL NUTRITION ASSESSMENT  DOCUMENTATION CODES Per approved criteria  -Obesity Unspecified   INTERVENTION: If pt expected to remain intubated for >24 hours, recommend initiation of Vital HP @ 20 ml/hr via OGT and increase by 10 ml every 4 hours to goal rate of 50 ml/hr.   30 ml Prostat TID.    Tube feeding regimen provides 1500 kcal (13 kcal/kg actual body weight), 150 grams of protein, and 912 ml of H2O.   NUTRITION DIAGNOSIS: Inadequate oral intake related to inability to eat as evidenced by NPO.   Goal: Pt to meet >/= 90% of their estimated nutrition needs   Monitor:  Weight trend, po intake, TF initiation, vent status/settings  Reason for Assessment: vent  64 y.o. male  Admitting Dx: Cholecystitis  ASSESSMENT: 64 year old Caucasian man with past medical history significant for COPD, GERD and a recently discharged gallbladder drain with development of an abscess and septic shock/acute respiratory failure who presented to the hospital yesterday. Had a CT scan of his abdomen/pelvis with intravenous contrast yesterday to study his area of dislodged gallbladder drain better prior to undergoing decompression of subcutaneous abscess and new drain placement in gallbladder.   - pt with worsening septic shock - drain replacement 4/26 - No signs of fat or muscle depletion - pt +2.5 L since admission. Admission weight used for TF calculations.  - Labs and medications reviewed  Na 134  K 6.6  BUN 39  Patient is currently intubated on ventilator support MV: 10.1 L/min Temp (24hrs), Avg:98.2 F (36.8 C), Min:97.3 F (36.3 C), Max:98.8 F (37.1 C)  Propofol: none  Height: Ht Readings from Last 1 Encounters:  11/27/14 5\' 11"  (1.803 m)    Weight: Wt Readings from Last 1 Encounters:  11/28/14 270 lb 8.1 oz (122.7 kg)    Ideal Body Weight: 73 kg  % Ideal Body Weight: 168%  Wt Readings from Last 10 Encounters:  11/28/14 270 lb 8.1 oz (122.7 kg)  11/19/14 250 lb  (113.399 kg)  11/13/14 245 lb (111.131 kg)  09/16/14 264 lb 8 oz (119.976 kg)  02/22/14 260 lb (117.935 kg)  02/19/14 262 lb 6.4 oz (119.024 kg)  01/24/14 255 lb (115.667 kg)  12/21/13 258 lb 9.6 oz (117.3 kg)  12/18/13 260 lb 6.4 oz (118.117 kg)  11/23/13 267 lb 3.2 oz (121.201 kg)    Usual Body Weight: unknown  % Usual Body Weight: n/a  BMI:  Body mass index is 37.74 kg/(m^2).  Estimated Nutritional Needs: Kcal: 8127-5170 (11-14 kcal/kg actual body weight) Protein: >151 g Fluid: per MD  Skin: intact  Diet Order: Diet NPO time specified  EDUCATION NEEDS: -Education not appropriate at this time   Intake/Output Summary (Last 24 hours) at 11/28/14 1346 Last data filed at 11/28/14 1300  Gross per 24 hour  Intake 2914.98 ml  Output    425 ml  Net 2489.98 ml    Last BM: 4/27, loose/watery   Labs:   Recent Labs Lab 11/28/2014 2154 11/27/14 0320 11/28/14 1010  NA 139 136 134*  K 4.3 4.3 6.6*  CL 101 103 102  CO2 29 26 24   BUN 22 19 39*  CREATININE 1.01 0.90 4.22*  CALCIUM 9.1 8.5 7.7*  GLUCOSE 114* 122* 97    CBG (last 3)   Recent Labs  11/27/14 2315 11/28/14 0301 11/28/14 0804  GLUCAP 160* 105* 126*    Scheduled Meds: . antiseptic oral rinse  7 mL Mouth Rinse QID  . chlorhexidine  15 mL Mouth Rinse  BID  . fentaNYL (SUBLIMAZE) injection  50 mcg Intravenous Once  . mometasone-formoterol  2 puff Inhalation BID  . pantoprazole (PROTONIX) IV  40 mg Intravenous Q24H  . piperacillin-tazobactam (ZOSYN)  IV  3.375 g Intravenous Q8H  . sodium chloride  3 mL Intravenous Q12H  . tiotropium  18 mcg Inhalation Daily    Continuous Infusions: . sodium chloride 10 mL/hr at 11/28/14 1300  . sodium chloride 10 mL/hr at 11/28/14 1300  . sodium chloride 100 mL/hr at 11/28/14 1300  . fentaNYL infusion INTRAVENOUS 125 mcg/hr (11/28/14 1306)  . norepinephrine (LEVOPHED) Adult infusion 3 mcg/min (11/28/14 1300)    Past Medical History  Diagnosis Date  . COPD  (chronic obstructive pulmonary disease)     Probable - long-standing tobacco abuse  . Chronic pain     Bilateral knees and shoulders  . GERD (gastroesophageal reflux disease)   . 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 [name]  . Pneumonia     January 2015  . Hepatitis     Hapatitis B 40 yrs ago    Past Surgical History  Procedure Laterality Date  . Right total knee arthroplasty  2003  . Left total knee arthroplasty  1998    "left knee replacement is broken" - 08/09/13    Laurette Schimke [place], [place], [place]

## 2014-11-28 NOTE — Progress Notes (Addendum)
Pts wedding band removed prior to HD Catheter Insertion. Wedding band given to patients wife.

## 2014-11-28 NOTE — Progress Notes (Signed)
Subjective: Patient intubated, but will follow commands ; CRRT planned  Objective: Vital signs in last 24 hours: Temp:  [97.3 F (36.3 C)-98.6 F (37 C)] 97.9 F (36.6 C) (04/27 1400) Pulse Rate:  [74-106] 100 (04/27 1400) Resp:  [10-30] 14 (04/27 1400) BP: (81-158)/(49-89) 113/71 mmHg (04/27 1400) SpO2:  [81 %-98 %] 93 % (04/27 1400) FiO2 (%):  [40 %-50 %] 40 % (04/27 1629) Weight:  [270 lb 8.1 oz (122.7 kg)] 270 lb 8.1 oz (122.7 kg) (04/27 0401)    Intake/Output from previous day: 04/26 0701 - 04/27 0700 In: 2610.5 [I.V.:2185.5; IV Piggyback:425] Out: 381 [Urine:171; Emesis/NG output:200; Drains:10] Intake/Output this shift: Total I/O In: 925.7 [I.V.:765.7; IV Piggyback:160] Out: 13 [Urine:90]  Patient intubated; gallbladder drain intact, output about 20 mL; no hemobilia; drain irrigated with 5 mL sterile normal saline without difficulty. Cultures pending  Lab Results:   Recent Labs  11/27/14 0320 11/28/14 0310  WBC 23.0* 37.3*  HGB 14.6 14.7  HCT 44.5 45.3  PLT 365 308   BMET  Recent Labs  11/28/14 1010 11/28/14 1423  NA 134* 136  K 6.6* 6.0*  CL 102 103  CO2 24 23  GLUCOSE 97 129*  BUN 39* 42*  CREATININE 4.22* 4.50*  CALCIUM 7.7* 8.1*   PT/INR  Recent Labs  11/27/14 0525 11/28/14 0310  LABPROT 14.1 16.9*  INR 1.08 1.36   ABG  Recent Labs  11/27/14 0656 11/27/14 0930  PHART 7.299* 7.211*  HCO3 24.9* 24.8*    Studies/Results: Ct Abdomen Pelvis W Contrast  11/27/2014   CLINICAL DATA:  64 year old male with history of cholecystitis status post placement of percutaneous cholecystostomy tube. The tube was subsequently displaced. Patient having recurrent pain at the tube insertion site.  EXAM: CT ABDOMEN AND PELVIS WITH CONTRAST  TECHNIQUE: Multidetector CT imaging of the abdomen and pelvis was performed using the standard protocol following bolus administration of intravenous contrast.  CONTRAST:  66mL OMNIPAQUE IOHEXOL 300 MG/ML SOLN,  161mL OMNIPAQUE IOHEXOL 300 MG/ML SOLN  COMPARISON:  Most recent prior CT abdomen/ pelvis 11/19/2014  FINDINGS: Lower Chest: Centrilobular emphysema. Mild lower lobe atelectasis. Heart is within normal limits for size. No pericardial effusion. Small hiatal hernia.  Abdomen: Unremarkable CT appearance of the stomach, duodenum, spleen, adrenal glands and pancreas. Normal hepatic contour and morphology. Mild gallbladder distention. There is slight hyper enhancement and thickening of the gallbladder wall with mild inflammatory stranding in the pericholecystic fat. Additionally, there is increased prominence of the soft tissue tract at the site of the prior gallbladder drain. There is small volume fluid tracking along the tracks with increased soft tissue thickening and inflammatory stranding.  Unremarkable appearance of the bilateral kidneys. No focal solid lesion, hydronephrosis or nephrolithiasis. 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 No evidence of obstruction or focal bowel wall thickening. Normal appendix in the right lower quadrant. The terminal ileum is unremarkable.  Pelvis: Unremarkable bladder, prostate gland and seminal vesicles. No free fluid or suspicious adenopathy.  Bones/Soft Tissues: No acute fracture or aggressive appearing lytic or blastic osseous lesion. Chronic bilateral L5 pars defects. No associated anterolisthesis.  Vascular: Atherosclerotic vascular disease without significant stenosis or aneurysmal dilatation.  IMPRESSION: 1. Developing recurrent gallbladder distention, wall thickening and hyper enhancement concerning for recurrent acute cholecystitis. 2. There is also progressive inflammatory stranding along the tract from the prior percutaneous cholecystostomy tube. The tract contains a small volume of fluid and communicates directly with the gallbladder lumen via a short transhepatic course. This  is concerning for bile leaking into the tract. Query clinical  history of bile leakage from the skin insertion site? Alternately, this could represent an abscess developing within the prior tube tract with secondary inflammatory stranding of the gallbladder. This is considered less likely. Nuclear medicine HIDA scan could further evaluate to confirm recurrent obstruction of the cystic duct.   Electronically Signed   By: Jacqulynn Cadet M.D.   On: 11/27/2014 01:05   US Abdomen Limited  11/28/2014   CLINICAL DATA:  64 year old with sepsis and cholecystitis. History of prior cholecystostomy tube.  EXAM: REPLACEMENT OF CHOLECYSTOSTOMY TUBE THROUGH EXISTING TRACT WITH ULTRASOUND AND FLUOROSCOPIC GUIDANCE  Physician: Stephan Minister. Henn, MD  FLUOROSCOPY TIME:  42 seconds, 48 mGy.  CONTRAST:  5 mL Omnipaque 300  MEDICATIONS: None  ANESTHESIA/SEDATION: Moderate sedation time: None  PROCEDURE: Informed consent was obtained from the patient's wife. Patient was placed supine on the interventional table. The right upper abdomen was evaluated with ultrasound. The gallbladder was small with a small amount of fluid. A hypoechoic tract was identified from the skin to gallbladder. In addition, the skin just above this tract was red and raised. The right abdomen was prepped and draped in sterile fashion. Maximal barrier sterile technique was utilized including caps, mask, sterile gowns, sterile gloves, sterile drape, hand hygiene and skin antiseptic. The area of redness was anesthetized with 1% lidocaine. A small incision was made and a 5 French catheter was advanced down the tract with ultrasound guidance. A large amount of bloody purulent fluid started to drained around the catheter. The area of skin fullness decompressed following this drainage. Ultrasound demonstrated that the 5 French drain was actually in the gallbladder. Small amount of contrast under fluoroscopy confirmed placement in the gallbladder. A J wire was placed and a 10 [place] multi-purpose drain was advanced over the wire.  Contrast injection again confirmed placement in the gallbladder. 30 mL of thick purulent green fluid was aspirated. Sample sent for culture. Catheter was sutured to the skin and attached to gravity bag. Bandage was placed.  FINDINGS: The old drain site was healed but this area was erythematous and raised. Ultrasound demonstrated a hypoechoic tract between the old skin site and the gallbladder. After making an incision at the old drain site, purulent fluid immediately drained to the skin. A 5 French catheter easily advanced from the skin site to the gallbladder. 10 French drain was confirmed within the gallbladder. 30 mL of purulent fluid was aspirated.  Estimated blood loss: Minimal  COMPLICATIONS: None  IMPRESSION: Successful replacement of the percutaneous cholecystostomy tube through the old drain tract.  Subcutaneous abscess along the old drain tract which was successfully decompressed during this procedure.   Electronically Signed   By: Markus Daft M.D.   On: 11/27/2014 17:30   Ir Perc Cholecystostomy  11/27/2014   CLINICAL DATA:  64 year old with sepsis and cholecystitis. History of prior cholecystostomy tube.  EXAM: REPLACEMENT OF CHOLECYSTOSTOMY TUBE THROUGH EXISTING TRACT WITH ULTRASOUND AND FLUOROSCOPIC GUIDANCE  Physician: Stephan Minister. Henn, MD  FLUOROSCOPY TIME:  42 seconds, 48 mGy.  CONTRAST:  5 mL Omnipaque 300  MEDICATIONS: None  ANESTHESIA/SEDATION: Moderate sedation time: None  PROCEDURE: Informed consent was obtained from the patient's wife. Patient was placed supine on the interventional table. The right upper abdomen was evaluated with ultrasound. The gallbladder was small with a small amount of fluid. A hypoechoic tract was identified from the skin to gallbladder. In addition, the skin just above this tract was red  and raised. The right abdomen was prepped and draped in sterile fashion. Maximal barrier sterile technique was utilized including caps, mask, sterile gowns, sterile gloves, sterile  drape, hand hygiene and skin antiseptic. The area of redness was anesthetized with 1% lidocaine. A small incision was made and a 5 French catheter was advanced down the tract with ultrasound guidance. A large amount of bloody purulent fluid started to drained around the catheter. The area of skin fullness decompressed following this drainage. Ultrasound demonstrated that the 5 French drain was actually in the gallbladder. Small amount of contrast under fluoroscopy confirmed placement in the gallbladder. A J wire was placed and a 10 [place] multi-purpose drain was advanced over the wire. Contrast injection again confirmed placement in the gallbladder. 30 mL of thick purulent green fluid was aspirated. Sample sent for culture. Catheter was sutured to the skin and attached to gravity bag. Bandage was placed.  FINDINGS: The old drain site was healed but this area was erythematous and raised. Ultrasound demonstrated a hypoechoic tract between the old skin site and the gallbladder. After making an incision at the old drain site, purulent fluid immediately drained to the skin. A 5 French catheter easily advanced from the skin site to the gallbladder. 10 French drain was confirmed within the gallbladder. 30 mL of purulent fluid was aspirated.  Estimated blood loss: Minimal  COMPLICATIONS: None  IMPRESSION: Successful replacement of the percutaneous cholecystostomy tube through the old drain tract.  Subcutaneous abscess along the old drain tract which was successfully decompressed during this procedure.   Electronically Signed   By: Markus Daft M.D.   On: 11/27/2014 17:30   Dg Chest Port 1 View  11/28/2014   CLINICAL DATA:  Acute respiratory failure.  EXAM: PORTABLE CHEST - 1 VIEW  COMPARISON:  11/28/2014 at 0458 hours  FINDINGS: Endotracheal tube remains in place and as well above the carina. Right jugular central venous catheter has been placed and terminates over the upper SVC. Left jugular catheter remains in place,  terminating over the upper SVC. Enteric tube courses into the left upper abdomen with tip not imaged. Pulmonary vascular congestion bilateral interstitial opacities overall appear slightly worse compared to the prior study. No pleural effusion or pneumothorax is identified. Cardiomediastinal silhouette is unchanged.  IMPRESSION: 1. New right jugular catheter terminates over the upper SVC. 2. Slight worsening of interstitial edema.   Electronically Signed   By: Logan Bores   On: 11/28/2014 16:20   Dg Chest Port 1 View  11/28/2014   CLINICAL DATA:  Respiratory failure, COPD, septic shock.  EXAM: PORTABLE CHEST - 1 VIEW  COMPARISON:  Portable chest x-ray of November 27, 2014  FINDINGS: The lungs are adequately inflated. The interstitial markings are coarse. There is no alveolar infiltrate. There is no pleural effusion. The cardiac silhouette is mildly enlarged. The pulmonary vascularity is mildly prominent centrally. The endotracheal tube tip projects 4.4 cm above the carina. The esophagogastric tube tip projects below the inferior margin of the image. The left internal jugular venous catheter tip projects over the proximal SVC.  IMPRESSION: Slight interval increase in pulmonary interstitial edema. There is no alveolar pneumonia nor significant pleural effusion. The support tubes are in appropriate position.   Electronically Signed   By: David  Martinique M.D.   On: 11/28/2014 07:31   Dg Chest Port 1 View  11/27/2014   CLINICAL DATA:  Respiratory failure.  EXAM: PORTABLE CHEST - 1 VIEW  COMPARISON:  One-view chest x-ray from the same  day at 4:52 a.m.  FINDINGS: The patient is now intubated. The endotracheal tube terminates 6 cm above the carina. A left IJ line has been placed. The tip is in the mid SVC. There is no pneumothorax. An NG tube courses off the inferior border of the film.  The heart size is normal. Mild pulmonary vascular congestion is stable. Atherosclerotic calcifications are again noted within the aortic  arch. The lungs are otherwise clear.  IMPRESSION: 1. Interval intubation. Satisfactory positioning of the endotracheal tube. 2. Satisfactory positioning of a new left IJ line without radiographic evidence for complication. 3. Atherosclerosis. 4. Stable pulmonary vascular congestion.   Electronically Signed   By: San Morelle M.D.   On: 11/27/2014 09:31   Dg Chest Port 1 View  11/27/2014   CLINICAL DATA:  Acute onset of shortness of breath. Initial encounter.  EXAM: PORTABLE CHEST - 1 VIEW  COMPARISON:  Chest radiograph performed 11/30/2014  FINDINGS: The lungs are well-aerated. Vascular congestion is noted, with mildly increased interstitial markings, possibly reflecting mild interstitial edema. As noted on prior CT, pneumonia could have a similar appearance. No pleural effusion or pneumothorax is seen.  The cardiomediastinal silhouette is within normal limits. No acute osseous abnormalities are seen.  IMPRESSION: Vascular congestion, with mildly increased interstitial markings, possibly reflecting mild interstitial edema. As noted on prior CT, pneumonia could have a similar appearance.   Electronically Signed   By: Garald Balding M.D.   On: 11/27/2014 06:01   Dg Chest Port 1 View  11/27/2014   CLINICAL DATA:  Patient is white states he had pneumonia with percutaneous drain that fell out. Drain was on right side of abdomen. Shortness of breath.  EXAM: PORTABLE CHEST - 1 VIEW  COMPARISON:  11/19/2014  FINDINGS: Patient slightly rotated to the right. Lungs are adequately inflated with mild prominence of the perihilar markings suggesting mild vascular congestion. No focal lobar consolidation or effusion. Cardiomediastinal silhouette and remainder of the exam is unchanged.  IMPRESSION: Findings suggesting mild vascular congestion.   Electronically Signed   By: Marin Olp M.D.   On: 11/18/2014 22:05    Anti-infectives: Anti-infectives    Start     Dose/Rate Route Frequency Ordered Stop   11/28/14  2000  piperacillin-tazobactam (ZOSYN) IVPB 2.25 g     2.25 g 100 mL/hr over 30 Minutes Intravenous Every 8 hours 11/28/14 1357     11/27/14 1700  vancomycin (VANCOCIN) IVPB 750 mg/150 ml premix  Status:  Discontinued     750 mg 150 mL/hr over 60 Minutes Intravenous Every 12 hours 11/27/14 0640 11/28/14 1141   11/27/14 1000  piperacillin-tazobactam (ZOSYN) IVPB 3.375 g  Status:  Discontinued     3.375 g 12.5 mL/hr over 240 Minutes Intravenous Every 8 hours 11/27/14 0435 11/28/14 1357   11/27/14 0130  vancomycin (VANCOCIN) 2,000 mg in sodium chloride 0.9 % 500 mL IVPB     2,000 mg 250 mL/hr over 120 Minutes Intravenous  Once 11/27/14 0120 11/27/14 0643   11/27/14 0130  piperacillin-tazobactam (ZOSYN) IVPB 3.375 g     3.375 g 100 mL/hr over 30 Minutes Intravenous  Once 11/27/14 0120 11/27/14 0227      Assessment/Plan: s/p replacement of percutaneous cholecystostomy tube through old drain tract 4/26, decompression of subcutaneous abscess along old drain tract; check final cultures; creatinine 4.5- patient to begin CRRT; potassium 6.0; WBC 37.3, hemoglobin 14.7; last T bili 0.8 ;afebrile; plans as per critical care/surgery  LOS: 1 day   15 minutes were  spent evaluating patient post cholecystostomy tube placement Krystalynn Ridgeway,D [facility] 11/28/2014

## 2014-11-28 NOTE — Progress Notes (Signed)
I noticed a family by elevator and they were very tearful. I learned they were the Rump family, his wife, daughters, and granddaughter. I offered assistance and his daughter immediately asked for prayer and reached out to join hands. After learning a little about their need for prayer, I prayed for Mr. Hurtubise's health and strength for the family. They also mentioned Tomi Bamberger had visited earlier and said she was "very sweet". They are very thankful for Chaplain support.   Ernest Haber Chaplain   11/28/14 1900  Clinical Encounter Type  Visited With Family

## 2014-11-28 NOTE — Consult Note (Signed)
Reason for Consult: Acute renal failure, hyperkalemia Referring Physician: Simonne Maffucci M.D. (critical-care)  HPI:  64 year old Caucasian man with past medical history significant for COPD, GERD and a recently discharged gallbladder drain with development of an abscess and septic shock/acute respiratory failure who presented to the hospital yesterday. Had a CT scan of his abdomen/pelvis with intravenous contrast yesterday to study his area of dislodged gallbladder drain better prior to undergoing decompression of subcutaneous abscess and new drain placement in gallbladder. Overnight, he has become progressively oliguric with worsening septic shock (possible cardiogenic component however unable to obtain optimal echocardiogram).  Labs this morning significant for hyperkalemia of 6.6 with attempted medical management and recheck pending. He has received fluid boluses but urine output remains suboptimal.  He appears to have a normal renal function at baseline (baseline creatinine 0.9-1.1). No reported history of recent NSAID use/RAS blockade.   11/19/2014  11/02/2014  11/27/2014  11/28/2014   BUN 25 (H) 22 19 39 (H)  Creatinine 1.12 1.01 0.90 4.22 (H)   Past Medical History  Diagnosis Date  . COPD (chronic obstructive pulmonary disease)     Probable - long-standing tobacco abuse  . Chronic pain     Bilateral knees and shoulders  . GERD (gastroesophageal reflux disease)   . 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 [name]  . Pneumonia     January 2015  . Hepatitis     Hapatitis B 40 yrs ago    Past Surgical History  Procedure Laterality Date  . Right total knee arthroplasty  2003  . Left total knee arthroplasty  1998    "left knee replacement is broken" - 08/09/13    Family History  Problem Relation Age of Onset  . Diabetes type II Father   . Coronary artery disease Father     MI in his 60s  . Diabetes Mother   . Diabetes Brother   . Diabetes Sister   . Asthma Sister      Social History:  reports that he has been smoking Cigarettes.  He started smoking about 52 years ago. He has a 100 pack-year smoking history. He has never used smokeless tobacco. He reports that he does not drink alcohol or use illicit drugs.  Allergies: No Known Allergies  Medications:  Scheduled: . antiseptic oral rinse  7 mL Mouth Rinse QID  . chlorhexidine  15 mL Mouth Rinse BID  . fentaNYL (SUBLIMAZE) injection  50 mcg Intravenous Once  . mometasone-formoterol  2 puff Inhalation BID  . pantoprazole (PROTONIX) IV  40 mg Intravenous Q24H  . piperacillin-tazobactam (ZOSYN)  IV  3.375 g Intravenous Q8H  . sodium chloride  3 mL Intravenous Q12H  . tiotropium  18 mcg Inhalation Daily    Results for orders placed or performed during the hospital encounter of 11/02/2014 (from the past 48 hour(s))  Comprehensive metabolic panel     Status: Abnormal   Collection Time: 11/08/2014  9:54 PM  Result Value Ref Range   Sodium 139 135 - 145 mmol/L   Potassium 4.3 3.5 - 5.1 mmol/L   Chloride 101 96 - 112 mmol/L   CO2 29 19 - 32 mmol/L   Glucose, Bld 114 (H) 70 - 99 mg/dL   BUN 22 6 - 23 mg/dL   Creatinine, Ser 1.01 0.50 - 1.35 mg/dL   Calcium 9.1 8.4 - 10.5 mg/dL   Total Protein 7.4 6.0 - 8.3 g/dL   Albumin 3.7 3.5 - 5.2 g/dL   AST 18  0 - 37 U/L   ALT 21 0 - 53 U/L   Alkaline Phosphatase 72 39 - 117 U/L   Total Bilirubin 0.4 0.3 - 1.2 mg/dL   GFR calc non Af Amer 77 (L) >90 mL/min   GFR calc Af Amer 89 (L) >90 mL/min    Comment: (NOTE) The eGFR has been calculated using the CKD EPI equation. This calculation has not been validated in all clinical situations. eGFR's persistently <90 mL/min signify possible Chronic Kidney Disease.    Anion gap 9 5 - 15  CBC with Differential     Status: Abnormal   Collection Time: 11/02/2014  9:54 PM  Result Value Ref Range   WBC 22.3 (H) 4.0 - 10.5 K/uL   RBC 4.76 4.22 - 5.81 MIL/uL   Hemoglobin 15.2 13.0 - 17.0 g/dL   HCT 48.3 23.4 - 68.8 %   MCV  94.3 78.0 - 100.0 fL   MCH 31.9 26.0 - 34.0 pg   MCHC 33.9 30.0 - 36.0 g/dL   RDW 73.7 30.8 - 16.8 %   Platelets 398 150 - 400 K/uL   Neutrophils Relative % 80 (H) 43 - 77 %   Neutro Abs 17.8 (H) 1.7 - 7.7 K/uL   Lymphocytes Relative 11 (L) 12 - 46 %   Lymphs Abs 2.5 0.7 - 4.0 K/uL   Monocytes Relative 7 3 - 12 %   Monocytes Absolute 1.5 (H) 0.1 - 1.0 K/uL   Eosinophils Relative 2 0 - 5 %   Eosinophils Absolute 0.4 0.0 - 0.7 K/uL   Basophils Relative 0 0 - 1 %   Basophils Absolute 0.1 0.0 - 0.1 K/uL  I-Stat CG4 Lactic Acid, ED     Status: None   Collection Time: 11/29/2014 10:02 PM  Result Value Ref Range   Lactic Acid, Venous 1.48 0.5 - 2.0 mmol/L  Blood culture (routine x 2)     Status: None (Preliminary result)   Collection Time: 11/27/14  1:45 AM  Result Value Ref Range   Specimen Description BLOOD L FA    Special Requests BOTTLES DRAWN AEROBIC AND ANAEROBIC 5CC    Culture             BLOOD CULTURE RECEIVED NO GROWTH TO DATE CULTURE WILL BE HELD FOR 5 DAYS BEFORE ISSUING A FINAL NEGATIVE REPORT Performed at [facility]    Report Status PENDING   Blood culture (routine x 2)     Status: None (Preliminary result)   Collection Time: 11/27/14  1:50 AM  Result Value Ref Range   Specimen Description BLOOD LHAND    Special Requests BOTTLES DRAWN AEROBIC AND ANAEROBIC 5CC    Culture             BLOOD CULTURE RECEIVED NO GROWTH TO DATE CULTURE WILL BE HELD FOR 5 DAYS BEFORE ISSUING A FINAL NEGATIVE REPORT Performed at [facility]    Report Status PENDING   Troponin I     Status: None   Collection Time: 11/27/14  3:20 AM  Result Value Ref Range   Troponin I <0.03 <0.031 ng/mL    Comment:        NO INDICATION OF MYOCARDIAL INJURY.   Lactic acid, plasma     Status: None   Collection Time: 11/27/14  3:20 AM  Result Value Ref Range   Lactic Acid, Venous 1.1 0.5 - 2.0 mmol/L  Brain natriuretic peptide     Status: None   Collection Time: 11/27/14  3:20 AM   Result Value Ref Range   B Natriuretic Peptide 31.0 0.0 - 100.0 pg/mL  Hepatic function panel     Status: Abnormal   Collection Time: 11/27/14  3:20 AM  Result Value Ref Range   Total Protein 6.8 6.0 - 8.3 g/dL   Albumin 3.3 (L) 3.5 - 5.2 g/dL   AST 18 0 - 37 U/L   ALT 20 0 - 53 U/L   Alkaline Phosphatase 70 39 - 117 U/L   Total Bilirubin 0.8 0.3 - 1.2 mg/dL   Bilirubin, Direct 0.2 0.0 - 0.5 mg/dL   Indirect Bilirubin 0.6 0.3 - 0.9 mg/dL  Basic metabolic panel     Status: Abnormal   Collection Time: 11/27/14  3:20 AM  Result Value Ref Range   Sodium 136 135 - 145 mmol/L   Potassium 4.3 3.5 - 5.1 mmol/L   Chloride 103 96 - 112 mmol/L   CO2 26 19 - 32 mmol/L   Glucose, Bld 122 (H) 70 - 99 mg/dL   BUN 19 6 - 23 mg/dL   Creatinine, Ser 0.90 0.50 - 1.35 mg/dL   Calcium 8.5 8.4 - 10.5 mg/dL   GFR calc non Af Amer 89 (L) >90 mL/min   GFR calc Af Amer >90 >90 mL/min    Comment: (NOTE) The eGFR has been calculated using the CKD EPI equation. This calculation has not been validated in all clinical situations. eGFR's persistently <90 mL/min signify possible Chronic Kidney Disease.    Anion gap 7 5 - 15  CBC with Differential/Platelet     Status: Abnormal   Collection Time: 11/27/14  3:20 AM  Result Value Ref Range   WBC 23.0 (H) 4.0 - 10.5 K/uL   RBC 4.67 4.22 - 5.81 MIL/uL   Hemoglobin 14.6 13.0 - 17.0 g/dL   HCT 44.5 39.0 - 52.0 %   MCV 95.3 78.0 - 100.0 fL   MCH 31.3 26.0 - 34.0 pg   MCHC 32.8 30.0 - 36.0 g/dL   RDW 15.0 11.5 - 15.5 %   Platelets 365 150 - 400 K/uL   Neutrophils Relative % 87 (H) 43 - 77 %   Neutro Abs 20.0 (H) 1.7 - 7.7 K/uL   Lymphocytes Relative 9 (L) 12 - 46 %   Lymphs Abs 2.0 0.7 - 4.0 K/uL   Monocytes Relative 3 3 - 12 %   Monocytes Absolute 0.7 0.1 - 1.0 K/uL   Eosinophils Relative 1 0 - 5 %   Eosinophils Absolute 0.2 0.0 - 0.7 K/uL   Basophils Relative 0 0 - 1 %   Basophils Absolute 0.0 0.0 - 0.1 K/uL  Procalcitonin - Baseline     Status: None    Collection Time: 11/27/14  3:20 AM  Result Value Ref Range   Procalcitonin 0.14 ng/mL    Comment:        Interpretation: PCT (Procalcitonin) <= 0.5 ng/mL: Systemic infection (sepsis) is not likely. Local bacterial infection is possible. (NOTE)         ICU PCT Algorithm               Non ICU PCT Algorithm    ----------------------------     ------------------------------         PCT < 0.25 ng/mL                 PCT < 0.1 ng/mL     Stopping of antibiotics  Stopping of antibiotics       strongly encouraged.               strongly encouraged.    ----------------------------     ------------------------------       PCT level decrease by               PCT < 0.25 ng/mL       >= 80% from peak PCT       OR PCT 0.25 - 0.5 ng/mL          Stopping of antibiotics                                             encouraged.     Stopping of antibiotics           encouraged.    ----------------------------     ------------------------------       PCT level decrease by              PCT >= 0.25 ng/mL       < 80% from peak PCT        AND PCT >= 0.5 ng/mL            Continuin g antibiotics                                              encouraged.       Continuing antibiotics            encouraged.    ----------------------------     ------------------------------     PCT level increase compared          PCT > 0.5 ng/mL         with peak PCT AND          PCT >= 0.5 ng/mL             Escalation of antibiotics                                          strongly encouraged.      Escalation of antibiotics        strongly encouraged.   Protime-INR     Status: None   Collection Time: 11/27/14  5:25 AM  Result Value Ref Range   Prothrombin Time 14.1 11.6 - 15.2 seconds   INR 1.08 0.00 - 1.49  APTT     Status: None   Collection Time: 11/27/14  5:25 AM  Result Value Ref Range   aPTT 30 24 - 37 seconds  Blood gas, arterial     Status: Abnormal   Collection Time: 11/27/14  6:56 AM  Result Value  Ref Range   O2 Content 5.0 L/min   Delivery systems NASAL CANNULA    pH, Arterial 7.299 (L) 7.350 - 7.450   pCO2 arterial 52.4 (H) 35.0 - 45.0 mmHg   pO2, Arterial 117 (H) 80.0 - 100.0 mmHg   Bicarbonate 24.9 (H) 20.0 - 24.0 mEq/L   TCO2 22.0 0 - 100 mmol/L   Acid-base deficit 1.9 0.0 - 2.0 mmol/L   O2 Saturation 97.5 %   Patient temperature  98.6    Collection site RIGHT RADIAL    Drawn by [phone]    Sample type ARTERIAL DRAW    Allens test (pass/fail) PASS PASS  Glucose, capillary     Status: Abnormal   Collection Time: 11/27/14  7:44 AM  Result Value Ref Range   Glucose-Capillary 120 (H) 70 - 99 mg/dL   Comment 1 Notify RN    Comment 2 Document in Chart   Troponin I (q 6hr x 3)     Status: None   Collection Time: 11/27/14  9:25 AM  Result Value Ref Range   Troponin I <0.03 <0.031 ng/mL    Comment:        NO INDICATION OF MYOCARDIAL INJURY.   MRSA PCR Screening     Status: None   Collection Time: 11/27/14  9:25 AM  Result Value Ref Range   MRSA by PCR NEGATIVE NEGATIVE    Comment:        The GeneXpert MRSA Assay (FDA approved for NASAL specimens only), is one component of a comprehensive MRSA colonization surveillance program. It is not intended to diagnose MRSA infection nor to guide or monitor treatment for MRSA infections.   Blood gas, arterial     Status: Abnormal   Collection Time: 11/27/14  9:30 AM  Result Value Ref Range   FIO2 1.00 %   Delivery systems VENTILATOR    Mode PRESSURE REGULATED VOLUME CONTROL    VT 600 mL   Rate 12 resp/min   Peep/cpap 5.0 cm H20   pH, Arterial 7.211 (L) 7.350 - 7.450   pCO2 arterial 64.4 (HH) 35.0 - 45.0 mmHg    Comment: CRITICAL RESULT CALLED TO, READ BACK BY AND VERIFIED WITH: PAM WEST,RN AT 1122 BY KENNAN DEPUE,RRT,RCP ON 11/27/2014    pO2, Arterial 386 (H) 80.0 - 100.0 mmHg   Bicarbonate 24.8 (H) 20.0 - 24.0 mEq/L   TCO2 22.6 0 - 100 mmol/L   Acid-base deficit 4.4 (H) 0.0 - 2.0 mmol/L   O2 Saturation 99.2 %    Patient temperature 98.6    Collection site RIGHT RADIAL    Drawn by [phone]    Sample type ARTERIAL DRAW    Allens test (pass/fail) PASS PASS  Culture, respiratory (NON-Expectorated)     Status: None (Preliminary result)   Collection Time: 11/27/14 10:56 AM  Result Value Ref Range   Specimen Description TRACHEAL ASPIRATE    Special Requests Normal    Gram Stain      FEW WBC PRESENT,BOTH PMN AND MONONUCLEAR RARE SQUAMOUS EPITHELIAL CELLS PRESENT RARE GRAM POSITIVE COCCI IN PAIRS Performed at [facility]    Culture      Culture reincubated for better growth Performed at [facility]    Report Status PENDING   Glucose, capillary     Status: Abnormal   Collection Time: 11/27/14 12:15 PM  Result Value Ref Range   Glucose-Capillary 129 (H) 70 - 99 mg/dL  Troponin I (q 6hr x 3)     Status: None   Collection Time: 11/27/14 12:50 PM  Result Value Ref Range   Troponin I <0.03 <0.031 ng/mL    Comment:        NO INDICATION OF MYOCARDIAL INJURY.   Glucose, capillary     Status: Abnormal   Collection Time: 11/27/14  3:52 PM  Result Value Ref Range   Glucose-Capillary 148 (H) 70 - 99 mg/dL   Comment 1 Notify RN    Comment 2 Document in Chart  Body fluid culture     Status: None (Preliminary result)   Collection Time: 11/27/14  5:06 PM  Result Value Ref Range   Specimen Description GALL BLADDER    Special Requests NONE    Gram Stain      RARE WBC PRESENT,BOTH PMN AND MONONUCLEAR FEW GRAM NEGATIVE RODS RARE GRAM POSITIVE COCCI IN PAIRS Gram Stain Report Called to,Read Back By and Verified With: Gram Stain Report Called to,Read Back By and Verified With: Viviano Simas RN 11/28/14 8:35AM BY MILSH Performed at [facility]    Culture NO GROWTH Performed at [facility]     Report Status PENDING   Glucose, capillary     Status: Abnormal   Collection Time: 11/27/14  7:50 PM  Result Value Ref Range   Glucose-Capillary 182 (H) 70 - 99 mg/dL   Troponin I (q 6hr x 3)     Status: None   Collection Time: 11/27/14  8:48 PM  Result Value Ref Range   Troponin I <0.03 <0.031 ng/mL    Comment:        NO INDICATION OF MYOCARDIAL INJURY.   Clostridium Difficile by PCR     Status: None   Collection Time: 11/27/14  8:54 PM  Result Value Ref Range   C difficile by pcr NEGATIVE NEGATIVE  Glucose, capillary     Status: Abnormal   Collection Time: 11/27/14 11:15 PM  Result Value Ref Range   Glucose-Capillary 160 (H) 70 - 99 mg/dL  Glucose, capillary     Status: Abnormal   Collection Time: 11/28/14  3:01 AM  Result Value Ref Range   Glucose-Capillary 105 (H) 70 - 99 mg/dL  CBC     Status: Abnormal   Collection Time: 11/28/14  3:10 AM  Result Value Ref Range   WBC 37.3 (H) 4.0 - 10.5 K/uL   RBC 4.72 4.22 - 5.81 MIL/uL   Hemoglobin 14.7 13.0 - 17.0 g/dL   HCT 18.9 92.1 - 23.5 %   MCV 96.0 78.0 - 100.0 fL   MCH 31.1 26.0 - 34.0 pg   MCHC 32.5 30.0 - 36.0 g/dL   RDW 62.6 85.3 - 95.9 %   Platelets 308 150 - 400 K/uL  Protime-INR     Status: Abnormal   Collection Time: 11/28/14  3:10 AM  Result Value Ref Range   Prothrombin Time 16.9 (H) 11.6 - 15.2 seconds   INR 1.36 0.00 - 1.49  APTT     Status: Abnormal   Collection Time: 11/28/14  3:10 AM  Result Value Ref Range   aPTT 38 (H) 24 - 37 seconds    Comment:        IF BASELINE aPTT IS ELEVATED, SUGGEST PATIENT RISK ASSESSMENT BE USED TO DETERMINE APPROPRIATE ANTICOAGULANT THERAPY.   Lactic acid, plasma     Status: None   Collection Time: 11/28/14  3:10 AM  Result Value Ref Range   Lactic Acid, Venous 1.9 0.5 - 2.0 mmol/L  Glucose, capillary     Status: Abnormal   Collection Time: 11/28/14  8:04 AM  Result Value Ref Range   Glucose-Capillary 126 (H) 70 - 99 mg/dL  Carboxyhemoglobin     Status: None   Collection Time: 11/28/14  9:42 AM  Result Value Ref Range   Total hemoglobin 15.7 13.5 - 18.0 g/dL   O2 Saturation 20.6 %   Carboxyhemoglobin 1.0 0.5 - 1.5 %    Methemoglobin 0.8 0.0 - 1.5 %  Basic metabolic panel  Status: Abnormal   Collection Time: 11/28/14 10:10 AM  Result Value Ref Range   Sodium 134 (L) 135 - 145 mmol/L   Potassium 6.6 (HH) 3.5 - 5.1 mmol/L    Comment: RESULT REPEATED AND VERIFIED DELTA CHECK NOTED NO VISIBLE HEMOLYSIS CRITICAL RESULT CALLED TO, READ BACK BY AND VERIFIED WITH: L ROBINSON RN @ 1120 ON 11/28/14 BY C DAVIS RESULTS VERIFIED VIA RECOLLECT    Chloride 102 96 - 112 mmol/L   CO2 24 19 - 32 mmol/L   Glucose, Bld 97 70 - 99 mg/dL   BUN 39 (H) 6 - 23 mg/dL    Comment: RESULT REPEATED AND VERIFIED DELTA CHECK NOTED RESULT CHECKED RESULTS VERIFIED VIA RECOLLECT    Creatinine, Ser 4.22 (H) 0.50 - 1.35 mg/dL    Comment: RESULT REPEATED AND VERIFIED DELTA CHECK NOTED RESULT CHECKED RESULTS VERIFIED VIA RECOLLECT    Calcium 7.7 (L) 8.4 - 10.5 mg/dL   GFR calc non Af Amer 14 (L) >90 mL/min   GFR calc Af Amer 16 (L) >90 mL/min    Comment: (NOTE) The eGFR has been calculated using the CKD EPI equation. This calculation has not been validated in all clinical situations. eGFR's persistently <90 mL/min signify possible Chronic Kidney Disease.    Anion gap 8 5 - 15  Troponin I     Status: None   Collection Time: 11/28/14 10:10 AM  Result Value Ref Range   Troponin I <0.03 <0.031 ng/mL    Comment:        NO INDICATION OF MYOCARDIAL INJURY.     Ct Abdomen Pelvis W Contrast  11/27/2014   CLINICAL DATA:  64 year old male with history of cholecystitis status post placement of percutaneous cholecystostomy tube. The tube was subsequently displaced. Patient having recurrent pain at the tube insertion site.  EXAM: CT ABDOMEN AND PELVIS WITH CONTRAST  TECHNIQUE: Multidetector CT imaging of the abdomen and pelvis was performed using the standard protocol following bolus administration of intravenous contrast.  CONTRAST:  43mL OMNIPAQUE IOHEXOL 300 MG/ML SOLN, 127mL OMNIPAQUE IOHEXOL 300 MG/ML SOLN  COMPARISON:  Most  recent prior CT abdomen/ pelvis 11/19/2014  FINDINGS: Lower Chest: Centrilobular emphysema. Mild lower lobe atelectasis. Heart is within normal limits for size. No pericardial effusion. Small hiatal hernia.  Abdomen: Unremarkable CT appearance of the stomach, duodenum, spleen, adrenal glands and pancreas. Normal hepatic contour and morphology. Mild gallbladder distention. There is slight hyper enhancement and thickening of the gallbladder wall with mild inflammatory stranding in the pericholecystic fat. Additionally, there is increased prominence of the soft tissue tract at the site of the prior gallbladder drain. There is small volume fluid tracking along the tracks with increased soft tissue thickening and inflammatory stranding.  Unremarkable appearance of the bilateral kidneys. No focal solid lesion, hydronephrosis or nephrolithiasis. 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 No evidence of obstruction or focal bowel wall thickening. Normal appendix in the right lower quadrant. The terminal ileum is unremarkable.  Pelvis: Unremarkable bladder, prostate gland and seminal vesicles. No free fluid or suspicious adenopathy.  Bones/Soft Tissues: No acute fracture or aggressive appearing lytic or blastic osseous lesion. Chronic bilateral L5 pars defects. No associated anterolisthesis.  Vascular: Atherosclerotic vascular disease without significant stenosis or aneurysmal dilatation.  IMPRESSION: 1. Developing recurrent gallbladder distention, wall thickening and hyper enhancement concerning for recurrent acute cholecystitis. 2. There is also progressive inflammatory stranding along the tract from the prior percutaneous cholecystostomy tube. The tract contains a small volume of fluid and  communicates directly with the gallbladder lumen via a short transhepatic course. This is concerning for bile leaking into the tract. Query clinical history of bile leakage from the skin insertion site?  Alternately, this could represent an abscess developing within the prior tube tract with secondary inflammatory stranding of the gallbladder. This is considered less likely. Nuclear medicine HIDA scan could further evaluate to confirm recurrent obstruction of the cystic duct.   Electronically Signed   By: Jacqulynn Cadet M.D.   On: 11/27/2014 01:05   US Abdomen Limited  11/28/2014   CLINICAL DATA:  64 year old with sepsis and cholecystitis. History of prior cholecystostomy tube.  EXAM: REPLACEMENT OF CHOLECYSTOSTOMY TUBE THROUGH EXISTING TRACT WITH ULTRASOUND AND FLUOROSCOPIC GUIDANCE  Physician: Stephan Minister. Henn, MD  FLUOROSCOPY TIME:  42 seconds, 48 mGy.  CONTRAST:  5 mL Omnipaque 300  MEDICATIONS: None  ANESTHESIA/SEDATION: Moderate sedation time: None  PROCEDURE: Informed consent was obtained from the patient's wife. Patient was placed supine on the interventional table. The right upper abdomen was evaluated with ultrasound. The gallbladder was small with a small amount of fluid. A hypoechoic tract was identified from the skin to gallbladder. In addition, the skin just above this tract was red and raised. The right abdomen was prepped and draped in sterile fashion. Maximal barrier sterile technique was utilized including caps, mask, sterile gowns, sterile gloves, sterile drape, hand hygiene and skin antiseptic. The area of redness was anesthetized with 1% lidocaine. A small incision was made and a 5 French catheter was advanced down the tract with ultrasound guidance. A large amount of bloody purulent fluid started to drained around the catheter. The area of skin fullness decompressed following this drainage. Ultrasound demonstrated that the 5 French drain was actually in the gallbladder. Small amount of contrast under fluoroscopy confirmed placement in the gallbladder. A J wire was placed and a 10 [place] multi-purpose drain was advanced over the wire. Contrast injection again confirmed placement in the  gallbladder. 30 mL of thick purulent green fluid was aspirated. Sample sent for culture. Catheter was sutured to the skin and attached to gravity bag. Bandage was placed.  FINDINGS: The old drain site was healed but this area was erythematous and raised. Ultrasound demonstrated a hypoechoic tract between the old skin site and the gallbladder. After making an incision at the old drain site, purulent fluid immediately drained to the skin. A 5 French catheter easily advanced from the skin site to the gallbladder. 10 French drain was confirmed within the gallbladder. 30 mL of purulent fluid was aspirated.  Estimated blood loss: Minimal  COMPLICATIONS: None  IMPRESSION: Successful replacement of the percutaneous cholecystostomy tube through the old drain tract.  Subcutaneous abscess along the old drain tract which was successfully decompressed during this procedure.   Electronically Signed   By: Markus Daft M.D.   On: 11/27/2014 17:30   Ir Perc Cholecystostomy  11/27/2014   CLINICAL DATA:  64 year old with sepsis and cholecystitis. History of prior cholecystostomy tube.  EXAM: REPLACEMENT OF CHOLECYSTOSTOMY TUBE THROUGH EXISTING TRACT WITH ULTRASOUND AND FLUOROSCOPIC GUIDANCE  Physician: Stephan Minister. Henn, MD  FLUOROSCOPY TIME:  42 seconds, 48 mGy.  CONTRAST:  5 mL Omnipaque 300  MEDICATIONS: None  ANESTHESIA/SEDATION: Moderate sedation time: None  PROCEDURE: Informed consent was obtained from the patient's wife. Patient was placed supine on the interventional table. The right upper abdomen was evaluated with ultrasound. The gallbladder was small with a small amount of fluid. A hypoechoic tract was identified from the skin  to gallbladder. In addition, the skin just above this tract was red and raised. The right abdomen was prepped and draped in sterile fashion. Maximal barrier sterile technique was utilized including caps, mask, sterile gowns, sterile gloves, sterile drape, hand hygiene and skin antiseptic. The area of  redness was anesthetized with 1% lidocaine. A small incision was made and a 5 French catheter was advanced down the tract with ultrasound guidance. A large amount of bloody purulent fluid started to drained around the catheter. The area of skin fullness decompressed following this drainage. Ultrasound demonstrated that the 5 French drain was actually in the gallbladder. Small amount of contrast under fluoroscopy confirmed placement in the gallbladder. A J wire was placed and a 10 [place] multi-purpose drain was advanced over the wire. Contrast injection again confirmed placement in the gallbladder. 30 mL of thick purulent green fluid was aspirated. Sample sent for culture. Catheter was sutured to the skin and attached to gravity bag. Bandage was placed.  FINDINGS: The old drain site was healed but this area was erythematous and raised. Ultrasound demonstrated a hypoechoic tract between the old skin site and the gallbladder. After making an incision at the old drain site, purulent fluid immediately drained to the skin. A 5 French catheter easily advanced from the skin site to the gallbladder. 10 French drain was confirmed within the gallbladder. 30 mL of purulent fluid was aspirated.  Estimated blood loss: Minimal  COMPLICATIONS: None  IMPRESSION: Successful replacement of the percutaneous cholecystostomy tube through the old drain tract.  Subcutaneous abscess along the old drain tract which was successfully decompressed during this procedure.   Electronically Signed   By: Markus Daft M.D.   On: 11/27/2014 17:30   Dg Chest Port 1 View  11/28/2014   CLINICAL DATA:  Respiratory failure, COPD, septic shock.  EXAM: PORTABLE CHEST - 1 VIEW  COMPARISON:  Portable chest x-ray of November 27, 2014  FINDINGS: The lungs are adequately inflated. The interstitial markings are coarse. There is no alveolar infiltrate. There is no pleural effusion. The cardiac silhouette is mildly enlarged. The pulmonary vascularity is mildly  prominent centrally. The endotracheal tube tip projects 4.4 cm above the carina. The esophagogastric tube tip projects below the inferior margin of the image. The left internal jugular venous catheter tip projects over the proximal SVC.  IMPRESSION: Slight interval increase in pulmonary interstitial edema. There is no alveolar pneumonia nor significant pleural effusion. The support tubes are in appropriate position.   Electronically Signed   By: David  Martinique M.D.   On: 11/28/2014 07:31   Dg Chest Port 1 View  11/27/2014   CLINICAL DATA:  Respiratory failure.  EXAM: PORTABLE CHEST - 1 VIEW  COMPARISON:  One-view chest x-ray from the same day at 4:52 a.m.  FINDINGS: The patient is now intubated. The endotracheal tube terminates 6 cm above the carina. A left IJ line has been placed. The tip is in the mid SVC. There is no pneumothorax. An NG tube courses off the inferior border of the film.  The heart size is normal. Mild pulmonary vascular congestion is stable. Atherosclerotic calcifications are again noted within the aortic arch. The lungs are otherwise clear.  IMPRESSION: 1. Interval intubation. Satisfactory positioning of the endotracheal tube. 2. Satisfactory positioning of a new left IJ line without radiographic evidence for complication. 3. Atherosclerosis. 4. Stable pulmonary vascular congestion.   Electronically Signed   By: San Morelle M.D.   On: 11/27/2014 09:31   Dg Chest [facility]  1 View  11/27/2014   CLINICAL DATA:  Acute onset of shortness of breath. Initial encounter.  EXAM: PORTABLE CHEST - 1 VIEW  COMPARISON:  Chest radiograph performed 12/01/2014  FINDINGS: The lungs are well-aerated. Vascular congestion is noted, with mildly increased interstitial markings, possibly reflecting mild interstitial edema. As noted on prior CT, pneumonia could have a similar appearance. No pleural effusion or pneumothorax is seen.  The cardiomediastinal silhouette is within normal limits. No acute osseous  abnormalities are seen.  IMPRESSION: Vascular congestion, with mildly increased interstitial markings, possibly reflecting mild interstitial edema. As noted on prior CT, pneumonia could have a similar appearance.   Electronically Signed   By: Garald Balding M.D.   On: 11/27/2014 06:01   Dg Chest Port 1 View  11/14/2014   CLINICAL DATA:  Patient is white states he had pneumonia with percutaneous drain that fell out. Drain was on right side of abdomen. Shortness of breath.  EXAM: PORTABLE CHEST - 1 VIEW  COMPARISON:  11/19/2014  FINDINGS: Patient slightly rotated to the right. Lungs are adequately inflated with mild prominence of the perihilar markings suggesting mild vascular congestion. No focal lobar consolidation or effusion. Cardiomediastinal silhouette and remainder of the exam is unchanged.  IMPRESSION: Findings suggesting mild vascular congestion.   Electronically Signed   By: Marin Olp M.D.   On: 11/27/2014 22:05    Review of Systems  Unable to perform ROS: intubated   Blood pressure 141/64, pulse 105, temperature 97.9 F (36.6 C), temperature source Core (Comment), resp. rate 19, height $RemoveBe'5\' 11"'NBLBwezqn$  (1.803 m), weight 122.7 kg (270 lb 8.1 oz), SpO2 97 %. Physical Exam  Nursing note and vitals reviewed. Constitutional: He appears well-developed and well-nourished. No distress.  HENT:  Head: Normocephalic and atraumatic.  Nose: Nose normal.  Intubated/sedated  Neck: No JVD present.  Intubated/sedated  Cardiovascular: Regular rhythm and normal heart sounds.   No murmur heard. Regular tachycardia  Respiratory: Effort normal. He has no wheezes. He has no rales.  Mechanical breath sounds  GI: Soft. He exhibits distension.  Right upper quadrant drain  Lymphadenopathy:    He has no cervical adenopathy.  Neurological:  Sedated  Skin: Skin is warm and dry. No rash noted. No erythema.    Assessment/Plan: 1. Septic shock secondary to gallbladder abscess/cholecystitis: Remains on pressors  with broad-spectrum antibiotic therapy and status post replacement of percutaneous gallbladder drain following evacuation of subcutaneous abscess. Remains tachycardic but blood pressures notably better. Afebrile overnight. 2. Acute renal failure: Appears to be associated with his septic shock versus contrast-induced nephropathy. He is currently oliguric and with critical electrolyte abnormalities prompting need for CRRT. Renal ultrasound has been requested as has a urinalysis/urine electrolytes. History and timeline of events does not point to an RPGN at this time and labs will be ordered as indicated based on urinalysis. Avoid nephrotoxic medications including NSAIDs and further intravenous iodinated contrast exposure. Continue strict Input and Output monitoring.  3. Hyperkalemia: Secondary to acute renal failure, medical management attempted and recheck labs pending. Plans for CRRT to be started later today. 4. Polycythemia: Chronic and secondary to underlying COPD/chronic hypoxia. He has an outpatient follow-up with hematology. 5. Chronic obstructive lung disease: Currently on ventilator-further management per CCM. Maloree Uplinger K. 11/28/2014, 1:19 PM

## 2014-11-28 NOTE — Consult Note (Signed)
PULMONARY / CRITICAL CARE MEDICINE   Name: Allen Green MRN: [id number] DOB: 07/08/1951    ADMISSION DATE:  11/15/2014 CONSULTATION DATE: 4/26  REFERRING MD :  [facility]  CHIEF COMPLAINT:  SOB  INITIAL PRESENTATION: 64 y/o male admitted on 4/26 with septic shock and acute respiratory failure due to cholecystitis and an abscess associated with a recently dislodged gallbladder drain.  STUDIES:  4/26 CT Abdomen> acute cholecystitis and gallbladder tract abscess 4/26 ECHO> poor study, can't comment on LV or RV size/function SIGNIFICANT EVENTS: 4/26 intubated, Gallbladder drain placed by IR   SUBJECTIVE:  Allen Green had a gallbladder drain placed yesterday   VITAL SIGNS: Temp:  [97.9 F (36.6 C)-99.3 F (37.4 C)] 98.2 F (36.8 C) (04/27 0800) Pulse Rate:  [91-110] 102 (04/27 0800) Resp:  [10-30] 16 (04/27 0800) BP: (57-150)/(16-79) 112/71 mmHg (04/27 0800) SpO2:  [92 %-99 %] 94 % (04/27 0800) FiO2 (%):  [40 %-100 %] 40 % (04/27 0909) Weight:  [122.7 kg (270 lb 8.1 oz)] 122.7 kg (270 lb 8.1 oz) (04/27 0401) HEMODYNAMICS: CVP:  [4 mmHg-17 mmHg] 4 mmHg VENTILATOR SETTINGS: Vent Mode:  [-] CPAP;PSV FiO2 (%):  [40 %-100 %] 40 % Set Rate:  [12 bmp] 12 bmp Vt Set:  [600 mL] 600 mL PEEP:  [5 cmH20] 5 cmH20 Pressure Support:  [5 cmH20] 5 cmH20 Plateau Pressure:  [18 cmH20-28 cmH20] 21 cmH20 INTAKE / OUTPUT:  Intake/Output Summary (Last 24 hours) at 11/28/14 0932 Last data filed at 11/28/14 0800  Gross per 24 hour  Intake 2721.03 ml  Output    401 ml  Net 2320.03 ml    PHYSICAL EXAMINATION: General:  On vent, following commands HENT: NCAT, ETT in place PULM: CTA B CV: Tachy, regular, no mgr GI: BS+, soft, drain in place Derm: improved redness, swelling around GB drain, no pus expressed Neuro: Awake, follows commands bilaterally,   LABS:  CBC  Recent Labs Lab 11/24/2014 2154 11/27/14 0320 11/28/14 0310  WBC 22.3* 23.0* 37.3*  HGB 15.2 14.6 14.7  HCT 44.9 44.5 45.3  PLT  398 365 308   Coag's  Recent Labs Lab 11/27/14 0525 11/28/14 0310  APTT 30 38*  INR 1.08 1.36   BMET  Recent Labs Lab 11/24/2014 2154 11/27/14 0320  NA 139 136  K 4.3 4.3  CL 101 103  CO2 29 26  BUN 22 19  CREATININE 1.01 0.90  GLUCOSE 114* 122*   Electrolytes  Recent Labs Lab 12/01/2014 2154 11/27/14 0320  CALCIUM 9.1 8.5   Sepsis Markers  Recent Labs Lab 11/24/2014 2202 11/27/14 0320 11/28/14 0310  LATICACIDVEN 1.48 1.1 1.9  PROCALCITON  --  0.14  --    ABG  Recent Labs Lab 11/27/14 0656 11/27/14 0930  PHART 7.299* 7.211*  PCO2ART 52.4* 64.4*  PO2ART 117* 386*   Liver Enzymes  Recent Labs Lab 11/24/2014 2154 11/27/14 0320  AST 18 18  ALT 21 20  ALKPHOS 72 70  BILITOT 0.4 0.8  ALBUMIN 3.7 3.3*   Cardiac Enzymes  Recent Labs Lab 11/27/14 0925 11/27/14 1250 11/27/14 2048  TROPONINI <0.03 <0.03 <0.03   Glucose  Recent Labs Lab 11/27/14 1215 11/27/14 1552 11/27/14 1950 11/27/14 2315 11/28/14 0301 11/28/14 0804  GLUCAP 129* 148* 182* 160* 105* 126*    Imaging    ASSESSMENT / PLAN:  PULMONARY OETT 4/26(PCCM)>> A: Acute respiratory failure with hypercapnea> improved 4/27, passing SBT Severe COPD, but doing surprisingly well on 4/27 AM, so an elective Ex-lap as outpatient  for cholecystectomy not unreasonable Tobacco abuse  P:   Hold extubation today as overall clinical picture seems a worse (sepsis, AKI, etc) Wean in Pressure support mode as able Vent bundle BD's Wean FIO2  CARDIOVASCULAR CVL 4/26 L IJ CVL>> A:  Septic shock > worse 4/27, echo not helpful,? Cardiogenic component P:  Check 12 lead Repeat troponin Check coox Check procalcitonin Levophed for MAP > 65  RENAL  A: Oliguric AKI P:   Monitor BMET and UOP Replace electrolytes as needed Start IVF continuous  GASTROINTESTINAL A:   Acute Cholecystis > with peri-gallbladder abscess, s/p drain 4/26 per IR Recent IR drain 4/3->4/12  P:   Abx as  noted F/U surgery recs Continue Antibiotic support Suspect he could tolerate elective outpatient cholecystectomy in future Monitor drain output  HEMATOLOGIC A:   Chronic polycythemia  P:  Follow H/H Monitor for bleeding  INFECTIOUS A:   Septic shock> worse 4/27 likely all related to draining abscess on 4/26, don't suspect secondary source or inappropriate antibiotic coverage at this point P:   BCx2  4/26>> UC  4/26>> Sputum 4/26>> Wound culture 4/26 > GPC, GNR  Abx:  4/26 vanc>> 4/26 zoysn>>  ENDOCRINE A:   No acute issue P:   SSI if needed  NEUROLOGIC A:   Intact prior to intubation P:   RASS goal: -1 Continue fentanyl per PAD protocol   FAMILY  - Updates: updated at length 4/26 and 4/27 AM bedside  - Inter-disciplinary family meet or Palliative Care meeting due by:  day 7   My cc time 45 minutes  Roselie Awkward, MD [place] [facility] Pager: [phone] Cell: [phone] If no response, call [phone]

## 2014-11-28 NOTE — Progress Notes (Signed)
eLink Physician-Brief Progress Note Patient Name: Allen Green DOB: [birth year] MRN: [id number]   Date of Service  11/28/2014  HPI/Events of Note  Second set blood cx's with GNR. Suspect that this is E coli given biliary source of sepsis, but will add cipro to current regimen until speciated in unlikely event that this is pseudomonas.   eICU Interventions       Intervention Category Intermediate Interventions: Infection - evaluation and management  Maryjo Ragon S. 11/28/2014, 6:32 PM

## 2014-11-28 NOTE — Progress Notes (Signed)
Subjective: Drain place yesterday and they got purulent fluid from the old drain tract and the GB.Urine output is poor still, and requiring more pressor for BP support, but weaning off Vent better than expected.    Objective: Vital signs in last 24 hours: Temp:  [97.9 F (36.6 C)-99.3 F (37.4 C)] 98.2 F (36.8 C) (04/27 0800) Pulse Rate:  [91-110] 102 (04/27 0800) Resp:  [10-30] 16 (04/27 0800) BP: (57-150)/(16-79) 112/71 mmHg (04/27 0800) SpO2:  [92 %-99 %] 94 % (04/27 0800) FiO2 (%):  [40 %-100 %] 40 % (04/27 0909) Weight:  [122.7 kg (270 lb 8.1 oz)] 122.7 kg (270 lb 8.1 oz) (04/27 0401)    Urine 171 recorded for yesterday 200 from NG recorded Drain: 10 ml recorded 3 stools recorded Afebrile, BP is better, still tachycardic on pressors On Vent  CVP 4 Labs: WBC 37.3 K CXR this AM:  Slight interval increase in pulmonary interstitial edema. There is no alveolar pneumonia nor significant pleural effusion.  Intake/Output from previous day: 04/26 0701 - 04/27 0700 In: 2610.5 [I.V.:2185.5; IV Piggyback:425] Out: 381 [Urine:171; Emesis/NG output:200; Drains:10] Intake/Output this shift: Total I/O In: 110.6 [I.V.:110.6] Out: 20 [Urine:20]  General appearance: sedated on Vent but wakes up and knows we are doing something with him.   Resp: On Vent but weaning fairly well. GI: large abdomen.  Green bile in NG and from the drain.  I took down the drain dressing site and the erythema is gone.  I got some fluid out of the tract, but squeezing around it, and what I saw was serous with some blood in it.    Lab Results:   Recent Labs  11/27/14 0320 11/28/14 0310  WBC 23.0* 37.3*  HGB 14.6 14.7  HCT 44.5 45.3  PLT 365 308    BMET  Recent Labs  11/24/2014 2154 11/27/14 0320  NA 139 136  K 4.3 4.3  CL 101 103  CO2 29 26  GLUCOSE 114* 122*  BUN 22 19  CREATININE 1.01 0.90  CALCIUM 9.1 8.5   PT/INR  Recent Labs  11/27/14 0525 11/28/14 0310  LABPROT 14.1 16.9*  INR  1.08 1.36     Recent Labs Lab 11/14/2014 2154 11/27/14 0320  AST 18 18  ALT 21 20  ALKPHOS 72 70  BILITOT 0.4 0.8  PROT 7.4 6.8  ALBUMIN 3.7 3.3*     Lipase     Component Value Date/Time   LIPASE 20 11/19/2014 0412     Studies/Results: Ct Abdomen Pelvis W Contrast  11/27/2014   CLINICAL DATA:  64 year old male with history of cholecystitis status post placement of percutaneous cholecystostomy tube. The tube was subsequently displaced. Patient having recurrent pain at the tube insertion site.  EXAM: CT ABDOMEN AND PELVIS WITH CONTRAST  TECHNIQUE: Multidetector CT imaging of the abdomen and pelvis was performed using the standard protocol following bolus administration of intravenous contrast.  CONTRAST:  75mL OMNIPAQUE IOHEXOL 300 MG/ML SOLN, 141mL OMNIPAQUE IOHEXOL 300 MG/ML SOLN  COMPARISON:  Most recent prior CT abdomen/ pelvis 11/19/2014  FINDINGS: Lower Chest: Centrilobular emphysema. Mild lower lobe atelectasis. Heart is within normal limits for size. No pericardial effusion. Small hiatal hernia.  Abdomen: Unremarkable CT appearance of the stomach, duodenum, spleen, adrenal glands and pancreas. Normal hepatic contour and morphology. Mild gallbladder distention. There is slight hyper enhancement and thickening of the gallbladder wall with mild inflammatory stranding in the pericholecystic fat. Additionally, there is increased prominence of the soft tissue tract at the site  of the prior gallbladder drain. There is small volume fluid tracking along the tracks with increased soft tissue thickening and inflammatory stranding.  Unremarkable appearance of the bilateral kidneys. No focal solid lesion, hydronephrosis or nephrolithiasis. 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 No evidence of obstruction or focal bowel wall thickening. Normal appendix in the right lower quadrant. The terminal ileum is unremarkable.  Pelvis: Unremarkable bladder, prostate gland and  seminal vesicles. No free fluid or suspicious adenopathy.  Bones/Soft Tissues: No acute fracture or aggressive appearing lytic or blastic osseous lesion. Chronic bilateral L5 pars defects. No associated anterolisthesis.  Vascular: Atherosclerotic vascular disease without significant stenosis or aneurysmal dilatation.  IMPRESSION: 1. Developing recurrent gallbladder distention, wall thickening and hyper enhancement concerning for recurrent acute cholecystitis. 2. There is also progressive inflammatory stranding along the tract from the prior percutaneous cholecystostomy tube. The tract contains a small volume of fluid and communicates directly with the gallbladder lumen via a short transhepatic course. This is concerning for bile leaking into the tract. Query clinical history of bile leakage from the skin insertion site? Alternately, this could represent an abscess developing within the prior tube tract with secondary inflammatory stranding of the gallbladder. This is considered less likely. Nuclear medicine HIDA scan could further evaluate to confirm recurrent obstruction of the cystic duct.   Electronically Signed   By: Jacqulynn Cadet M.D.   On: 11/27/2014 01:05   Ir Perc Cholecystostomy  11/27/2014   CLINICAL DATA:  64 year old with sepsis and cholecystitis. History of prior cholecystostomy tube.  EXAM: REPLACEMENT OF CHOLECYSTOSTOMY TUBE THROUGH EXISTING TRACT WITH ULTRASOUND AND FLUOROSCOPIC GUIDANCE  Physician: Stephan Minister. Henn, MD  FLUOROSCOPY TIME:  42 seconds, 48 mGy.  CONTRAST:  5 mL Omnipaque 300  MEDICATIONS: None  ANESTHESIA/SEDATION: Moderate sedation time: None  PROCEDURE: Informed consent was obtained from the patient's wife. Patient was placed supine on the interventional table. The right upper abdomen was evaluated with ultrasound. The gallbladder was small with a small amount of fluid. A hypoechoic tract was identified from the skin to gallbladder. In addition, the skin just above this tract was  red and raised. The right abdomen was prepped and draped in sterile fashion. Maximal barrier sterile technique was utilized including caps, mask, sterile gowns, sterile gloves, sterile drape, hand hygiene and skin antiseptic. The area of redness was anesthetized with 1% lidocaine. A small incision was made and a 5 French catheter was advanced down the tract with ultrasound guidance. A large amount of bloody purulent fluid started to drained around the catheter. The area of skin fullness decompressed following this drainage. Ultrasound demonstrated that the 5 French drain was actually in the gallbladder. Small amount of contrast under fluoroscopy confirmed placement in the gallbladder. A J wire was placed and a 10 [place] multi-purpose drain was advanced over the wire. Contrast injection again confirmed placement in the gallbladder. 30 mL of thick purulent green fluid was aspirated. Sample sent for culture. Catheter was sutured to the skin and attached to gravity bag. Bandage was placed.  FINDINGS: The old drain site was healed but this area was erythematous and raised. Ultrasound demonstrated a hypoechoic tract between the old skin site and the gallbladder. After making an incision at the old drain site, purulent fluid immediately drained to the skin. A 5 French catheter easily advanced from the skin site to the gallbladder. 10 French drain was confirmed within the gallbladder. 30 mL of purulent fluid was aspirated.  Estimated blood loss: Minimal  COMPLICATIONS:  None  IMPRESSION: Successful replacement of the percutaneous cholecystostomy tube through the old drain tract.  Subcutaneous abscess along the old drain tract which was successfully decompressed during this procedure.   Electronically Signed   By: Markus Daft M.D.   On: 11/27/2014 17:30   Dg Chest Port 1 View  11/28/2014   CLINICAL DATA:  Respiratory failure, COPD, septic shock.  EXAM: PORTABLE CHEST - 1 VIEW  COMPARISON:  Portable chest x-ray of November 27, 2014  FINDINGS: The lungs are adequately inflated. The interstitial markings are coarse. There is no alveolar infiltrate. There is no pleural effusion. The cardiac silhouette is mildly enlarged. The pulmonary vascularity is mildly prominent centrally. The endotracheal tube tip projects 4.4 cm above the carina. The esophagogastric tube tip projects below the inferior margin of the image. The left internal jugular venous catheter tip projects over the proximal SVC.  IMPRESSION: Slight interval increase in pulmonary interstitial edema. There is no alveolar pneumonia nor significant pleural effusion. The support tubes are in appropriate position.   Electronically Signed   By: David  Martinique M.D.   On: 11/28/2014 07:31   Dg Chest Port 1 View  11/27/2014   CLINICAL DATA:  Respiratory failure.  EXAM: PORTABLE CHEST - 1 VIEW  COMPARISON:  One-view chest x-ray from the same day at 4:52 a.m.  FINDINGS: The patient is now intubated. The endotracheal tube terminates 6 cm above the carina. A left IJ line has been placed. The tip is in the mid SVC. There is no pneumothorax. An NG tube courses off the inferior border of the film.  The heart size is normal. Mild pulmonary vascular congestion is stable. Atherosclerotic calcifications are again noted within the aortic arch. The lungs are otherwise clear.  IMPRESSION: 1. Interval intubation. Satisfactory positioning of the endotracheal tube. 2. Satisfactory positioning of a new left IJ line without radiographic evidence for complication. 3. Atherosclerosis. 4. Stable pulmonary vascular congestion.   Electronically Signed   By: San Morelle M.D.   On: 11/27/2014 09:31   Dg Chest Port 1 View  11/27/2014   CLINICAL DATA:  Acute onset of shortness of breath. Initial encounter.  EXAM: PORTABLE CHEST - 1 VIEW  COMPARISON:  Chest radiograph performed 11/03/2014  FINDINGS: The lungs are well-aerated. Vascular congestion is noted, with mildly increased interstitial markings,  possibly reflecting mild interstitial edema. As noted on prior CT, pneumonia could have a similar appearance. No pleural effusion or pneumothorax is seen.  The cardiomediastinal silhouette is within normal limits. No acute osseous abnormalities are seen.  IMPRESSION: Vascular congestion, with mildly increased interstitial markings, possibly reflecting mild interstitial edema. As noted on prior CT, pneumonia could have a similar appearance.   Electronically Signed   By: Garald Balding M.D.   On: 11/27/2014 06:01   Dg Chest Port 1 View  11/08/2014   CLINICAL DATA:  Patient is white states he had pneumonia with percutaneous drain that fell out. Drain was on right side of abdomen. Shortness of breath.  EXAM: PORTABLE CHEST - 1 VIEW  COMPARISON:  11/19/2014  FINDINGS: Patient slightly rotated to the right. Lungs are adequately inflated with mild prominence of the perihilar markings suggesting mild vascular congestion. No focal lobar consolidation or effusion. Cardiomediastinal silhouette and remainder of the exam is unchanged.  IMPRESSION: Findings suggesting mild vascular congestion.   Electronically Signed   By: Marin Olp M.D.   On: 12/01/2014 22:05    Medications: . antiseptic oral rinse  7 mL Mouth Rinse  QID  . chlorhexidine  15 mL Mouth Rinse BID  . fentaNYL (SUBLIMAZE) injection  50 mcg Intravenous Once  . mometasone-formoterol  2 puff Inhalation BID  . pantoprazole (PROTONIX) IV  40 mg Intravenous Q24H  . piperacillin-tazobactam (ZOSYN)  IV  3.375 g Intravenous Q8H  . sodium chloride  3 mL Intravenous Q12H  . tiotropium  18 mcg Inhalation Daily  . vancomycin  750 mg Intravenous Q12H   . sodium chloride 10 mL/hr at 11/28/14 0800  . sodium chloride 10 mL/hr at 11/28/14 0800  . fentaNYL infusion INTRAVENOUS 100 mcg/hr (11/28/14 0800)  . norepinephrine (LEVOPHED) Adult infusion 15.04 mcg/min (11/28/14 0800)    Assessment/Plan Recurrent acute cholecystitis Questionable biloma from prior  drain Sepsis with shock (Gram Positives and gram negative organisms from gram stain.) Acute respiratory failure Severe COPD Polycythemia from hypoxia Hepatitis B hx. Pneumonia Bilateral knee arthroplasties with limited mobility Morbid obesity Day 3 Zosyn/Vancomycin DVT:  SCD's added, no heparin or Lovenox currently.    Plan:  I think from our standpoint continue antibiotics, and drain.  Support for hypotension, Vent, and low urine output.  Dr. Fayne Mediate says he is weaning fairly well from the Vent, but may leave him on for now with all the other issues including BP and elevated WBC.  He has labs ordered for tomorrow already.    LOS: 1 day    Ewing Fandino 11/28/2014

## 2014-11-28 NOTE — Procedures (Signed)
Hemodialysis Catheter Insertion Procedure Note MALVIN MORRISH [id number] 01/04/1951  Procedure: Insertion of Hemodialysis Catheter Indications: Hemodialysis  Procedure Details Consent: Risks of procedure as well as the alternatives and risks of each were explained to the (patient/caregiver).  Consent for procedure obtained.   Time Out: Verified patient identification, verified procedure, site/side was marked, verified correct patient position, special equipment/implants available, medications/allergies/relevent history reviewed, required imaging and test results available.  Performed  Maximum sterile technique was used including antiseptics, cap, gloves, gown, hand hygiene, mask and sheet. Skin prep: Chlorhexidine; local anesthetic administered A antimicrobial bonded/coated triple lumen catheter was placed in the right internal jugular vein to 15 cm using the Seldinger technique.  Evaluation Blood flow good Complications: No apparent complications Patient did tolerate procedure well. Chest X-ray ordered to verify placement.  CXR: pending.  Procedure performed under supervision of Dr. Lake Bells and with ultrasound guidance for real time vessel cannulation.     Noe Gens, NP-C [facility] Pgr: [phone] or [phone]    11/28/2014, 3:33 PM

## 2014-11-28 NOTE — Progress Notes (Signed)
eLink Physician-Brief Progress Note Patient Name: Allen Green DOB: 12/12/1950 MRN: [id number]   Date of Service  11/28/2014  HPI/Events of Note  Oliguric.  eICU Interventions  Will give fluid bolus.        [name] 11/28/2014, 12:38 AM

## 2014-11-28 NOTE — Progress Notes (Signed)
ANTIBIOTIC CONSULT NOTE - INITIAL  Pharmacy Consult for cipro Indication: GNR bacteremia  No Known Allergies  Patient Measurements: Height: 5\' 11"  (180.3 cm) Weight: 270 lb 8.1 oz (122.7 kg) IBW/kg (Calculated) : 75.3  Vital Signs: Temp: 97.7 F (36.5 C) (04/27 1800) Temp Source: Core (Comment) (04/27 0800) BP: 105/50 mmHg (04/27 1800) Pulse Rate: 101 (04/27 1800) Intake/Output from previous day: 04/26 0701 - 04/27 0700 In: 2610.5 [I.V.:2185.5; IV Piggyback:425] Out: 381 [Urine:171; Emesis/NG output:200; Drains:10] Intake/Output from this shift: Total I/O In: 1062.6 [I.V.:902.6; IV Piggyback:160] Out: 89 [Urine:90]  Labs:  Recent Labs  11/23/2014 2154 11/27/14 0320 11/28/14 0310 11/28/14 1010 11/28/14 1423  WBC 22.3* 23.0* 37.3*  --   --   HGB 15.2 14.6 14.7  --   --   PLT 398 365 308  --   --   CREATININE 1.01 0.90  --  4.22* 4.50*   Estimated Creatinine Clearance: 22.4 mL/min (by C-G formula based on Cr of 4.5). No results for input(s): VANCOTROUGH, VANCOPEAK, VANCORANDOM, GENTTROUGH, GENTPEAK, GENTRANDOM, TOBRATROUGH, TOBRAPEAK, TOBRARND, AMIKACINPEAK, AMIKACINTROU, AMIKACIN in the last 72 hours.   Microbiology: Recent Results (from the past 720 hour(s))  Blood culture (routine x 2)     Status: None (Preliminary result)   Collection Time: 11/27/14  1:45 AM  Result Value Ref Range Status   Specimen Description BLOOD L FA  Final   Special Requests BOTTLES DRAWN AEROBIC AND ANAEROBIC 5CC  Final   Culture   Final    GRAM NEGATIVE RODS Note: Gram Stain Report Called to,Read Back By and Verified With: ALISON RN ON ICU AT 1651 [id number] BY CASTC Performed at [facility]    Report Status PENDING  Incomplete  Blood culture (routine x 2)     Status: None (Preliminary result)   Collection Time: 11/27/14  1:50 AM  Result Value Ref Range Status   Specimen Description BLOOD LHAND  Final   Special Requests BOTTLES DRAWN AEROBIC AND ANAEROBIC 5CC  Final   Culture   Final    GRAM NEGATIVE RODS Note: Gram Stain Report Called to,Read Back By and Verified With: Charmayne Sheer @ 0737 ON [id number] BY [place] Performed at [facility]    Report Status PENDING  Incomplete  MRSA PCR Screening     Status: None   Collection Time: 11/27/14  9:25 AM  Result Value Ref Range Status   MRSA by PCR NEGATIVE NEGATIVE Final    Comment:        The GeneXpert MRSA Assay (FDA approved for NASAL specimens only), is one component of a comprehensive MRSA colonization surveillance program. It is not intended to diagnose MRSA infection nor to guide or monitor treatment for MRSA infections.   Culture, respiratory (NON-Expectorated)     Status: None (Preliminary result)   Collection Time: 11/27/14 10:56 AM  Result Value Ref Range Status   Specimen Description TRACHEAL ASPIRATE  Final   Special Requests Normal  Final   Gram Stain   Final    FEW WBC PRESENT,BOTH PMN AND MONONUCLEAR RARE SQUAMOUS EPITHELIAL CELLS PRESENT RARE GRAM POSITIVE COCCI IN PAIRS Performed at [facility]    Culture   Final    Culture reincubated for better growth Performed at [facility]    Report Status PENDING  Incomplete  Body fluid culture     Status: None (Preliminary result)   Collection Time: 11/27/14  5:06 PM  Result Value Ref Range Status   Specimen Description GALL BLADDER  Final   Special Requests NONE  Final   Gram Stain   Final    RARE WBC PRESENT,BOTH PMN AND MONONUCLEAR FEW GRAM NEGATIVE RODS RARE GRAM POSITIVE COCCI IN PAIRS Gram Stain Report Called to,Read Back By and Verified With: Gram Stain Report Called to,Read Back By and Verified With: Charmayne Sheer RN 11/28/14 8:35AM BY [place] Performed at [facility]    Culture NO GROWTH Performed at [facility]   Final   Report Status PENDING  Incomplete  Clostridium Difficile by PCR     Status: None   Collection Time: 11/27/14  8:54 PM  Result Value Ref Range Status   C  difficile by pcr NEGATIVE NEGATIVE Final    Medical History: Past Medical History  Diagnosis Date  . COPD (chronic obstructive pulmonary disease)     Probable - long-standing tobacco abuse  . Chronic pain     Bilateral knees and shoulders  . GERD (gastroesophageal reflux disease)   . 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 [name]  . Pneumonia     January 2015  . Hepatitis     Hapatitis B 40 yrs ago   Assessment: Patient's a 64 y.o M currently on vancomycin and zosyn for r/o sepsis with CRRT started this evening.  Blood cultures now back with GNR.  To add cipro to abx regimen until speciation and sensitivities are back.    4/26 >> Vanc >> 4/26 >> Zosyn >>  4/27>> cipro>>   4/26 blood x2: GNR 4/25 trach asp: rare GPC 4/26 gall bladder fluid: polymicrobial  Plan:  -  Change zosyn to 3.375 gm IV q6h (for CRRT) -  Cipro 400mg  IV q12h  Cleland Simkins P 11/28/2014,6:39 PM

## 2014-11-28 NOTE — Progress Notes (Addendum)
ANTIBIOTIC CONSULT NOTE - Follow up  Pharmacy Consult for Zosyn/Vancomycin Indication: Intra-abdominal infection/Sepsis  No Known Allergies  Patient Measurements: Height:  71 inches Weight: 113.4 kg  Vital Signs: Temp: 98.1 F (36.7 C) (04/27 1045) Temp Source: Core (Comment) (04/27 0800) BP: 144/69 mmHg (04/27 1030) Pulse Rate: 102 (04/27 1045) Intake/Output from previous day: 04/26 0701 - 04/27 0700 In: 2610.5 [I.V.:2185.5; IV Piggyback:425] Out: 381 [Urine:171; Emesis/NG output:200; Drains:10] Intake/Output from this shift: Total I/O In: 410.8 [I.V.:360.8; IV Piggyback:50] Out: 55 [Urine:55]  Labs:  Recent Labs  11/09/2014 2154 11/27/14 0320 11/28/14 0310 11/28/14 1010  WBC 22.3* 23.0* 37.3*  --   HGB 15.2 14.6 14.7  --   PLT 398 365 308  --   CREATININE 1.01 0.90  --  4.22*   Estimated Creatinine Clearance: 23.9 mL/min (by C-G formula based on Cr of 4.22). No results for input(s): VANCOTROUGH, VANCOPEAK, VANCORANDOM, GENTTROUGH, GENTPEAK, GENTRANDOM, TOBRATROUGH, TOBRAPEAK, TOBRARND, AMIKACINPEAK, AMIKACINTROU, AMIKACIN in the last 72 hours.   Microbiology: Recent Results (from the past 720 hour(s))  Blood culture (routine x 2)     Status: None (Preliminary result)   Collection Time: 11/27/14  1:45 AM  Result Value Ref Range Status   Specimen Description BLOOD L FA  Final   Special Requests BOTTLES DRAWN AEROBIC AND ANAEROBIC 5CC  Final   Culture   Final           BLOOD CULTURE RECEIVED NO GROWTH TO DATE CULTURE WILL BE HELD FOR 5 DAYS BEFORE ISSUING A FINAL NEGATIVE REPORT Performed at [facility]    Report Status PENDING  Incomplete  Blood culture (routine x 2)     Status: None (Preliminary result)   Collection Time: 11/27/14  1:50 AM  Result Value Ref Range Status   Specimen Description BLOOD LHAND  Final   Special Requests BOTTLES DRAWN AEROBIC AND ANAEROBIC 5CC  Final   Culture   Final           BLOOD CULTURE RECEIVED NO GROWTH TO DATE  CULTURE WILL BE HELD FOR 5 DAYS BEFORE ISSUING A FINAL NEGATIVE REPORT Performed at [facility]    Report Status PENDING  Incomplete  MRSA PCR Screening     Status: None   Collection Time: 11/27/14  9:25 AM  Result Value Ref Range Status   MRSA by PCR NEGATIVE NEGATIVE Final    Comment:        The GeneXpert MRSA Assay (FDA approved for NASAL specimens only), is one component of a comprehensive MRSA colonization surveillance program. It is not intended to diagnose MRSA infection nor to guide or monitor treatment for MRSA infections.   Culture, respiratory (NON-Expectorated)     Status: None (Preliminary result)   Collection Time: 11/27/14 10:56 AM  Result Value Ref Range Status   Specimen Description TRACHEAL ASPIRATE  Final   Special Requests Normal  Final   Gram Stain   Final    FEW WBC PRESENT,BOTH PMN AND MONONUCLEAR RARE SQUAMOUS EPITHELIAL CELLS PRESENT RARE GRAM POSITIVE COCCI IN PAIRS Performed at [facility]    Culture   Final    Culture reincubated for better growth Performed at [facility]    Report Status PENDING  Incomplete  Body fluid culture     Status: None (Preliminary result)   Collection Time: 11/27/14  5:06 PM  Result Value Ref Range Status   Specimen Description GALL BLADDER  Final   Special Requests NONE  Final  Gram Stain   Final    RARE WBC PRESENT,BOTH PMN AND MONONUCLEAR FEW GRAM NEGATIVE RODS RARE GRAM POSITIVE COCCI IN PAIRS Gram Stain Report Called to,Read Back By and Verified With: Gram Stain Report Called to,Read Back By and Verified With: Charmayne Sheer RN 11/28/14 8:35AM BY [place] Performed at [facility]    Culture NO GROWTH Performed at [facility]   Final   Report Status PENDING  Incomplete  Clostridium Difficile by PCR     Status: None   Collection Time: 11/27/14  8:54 PM  Result Value Ref Range Status   C difficile by pcr NEGATIVE NEGATIVE Final   Assessment: 64 yr male with  percutaneous drain for acute cholecystitis which dislodged last week.  Patient presents with complaint of abdominal pain.  Febrile, elevated WBC.  CT shows possible biliary leak and ? Acute cholecystitis  Patient to be admitted for Sepsis and Cholecystitis.  Surgery consulted  Significant events: 4/26: Rapid response for diaphoresis, SOB, hypotension. Tx to ICU and intubated. SQ abscess drained and new perc gallbladder drain placed.  Today, 11/28/2014: SCr jumped to 4.22 and now oliguric. CrCl ~24. Starting CRRT. Remains afebrile. WBCs rising.  4/26 blood x2: ngtd 4/25 trach asp: rare GPC 4/26 gall bladder fluid: polymicrobial  Goal of Therapy:  Vancomycin Trough level : 15-20 mcg/ml Appropriate antibiotic dosing for renal function; eradication of infection  Plan:  Change Zosyn to 2.25g IV q8h. Next dose at 20:00. Stop Vancomycin. Check random Vanc level in the am(24hrs after last dose). Follow up renal fxn, culture results, and clinical course.  Romeo Rabon, PharmD, pager [phone]. 11/28/2014,11:52 AM.

## 2014-11-28 NOTE — Progress Notes (Signed)
CRITICAL VALUE ALERT  Critical value received:  K6.6  Date of notification:  11/28/14  Time of notification:  1610  Critical value read back:Yes.    Nurse who received alert:  Cornell Barman  MD notified (1st page):  mcquaid  Time of first page:  83  MD notified (2nd page):  Time of second page:  Responding MD:  mcquaid  Time MD responded:  1115

## 2014-11-28 NOTE — Progress Notes (Signed)
Chaplain visited with family and prayed for strength during the journey. Chaplain assured the family that patient is getting the  best of care. Family was appreciative of the support. Chaplain will continue to follow-up with patient and family.   11/28/14 1000  Clinical Encounter Type  Visited With Family  Visit Type Follow-up  Referral From Chaplain  Spiritual Encounters  Spiritual Needs Prayer;Emotional  Stress Factors  Patient Stress Factors None identified  Family Stress Factors None identified  Advance Directives (For Healthcare)  Does patient have an advance directive? No  Would patient like information on creating an advanced directive? No - patient declined information

## 2014-11-29 ENCOUNTER — Other Ambulatory Visit (HOSPITAL_COMMUNITY): Payer: Medicare Other

## 2014-11-29 ENCOUNTER — Inpatient Hospital Stay (HOSPITAL_COMMUNITY): Payer: Medicare Other

## 2014-11-29 ENCOUNTER — Ambulatory Visit: Payer: Medicare Other | Admitting: Emergency Medicine

## 2014-11-29 LAB — CBC WITH DIFFERENTIAL/PLATELET
Basophils Absolute: 0 10*3/uL (ref 0.0–0.1)
Basophils Relative: 0 % (ref 0–1)
EOS PCT: 0 % (ref 0–5)
Eosinophils Absolute: 0 10*3/uL (ref 0.0–0.7)
HCT: 41.2 % (ref 39.0–52.0)
Hemoglobin: 13 g/dL (ref 13.0–17.0)
LYMPHS ABS: 1 10*3/uL (ref 0.7–4.0)
Lymphocytes Relative: 3 % — ABNORMAL LOW (ref 12–46)
MCH: 30.9 pg (ref 26.0–34.0)
MCHC: 31.6 g/dL (ref 30.0–36.0)
MCV: 97.9 fL (ref 78.0–100.0)
MONOS PCT: 3 % (ref 3–12)
Monocytes Absolute: 1 10*3/uL (ref 0.1–1.0)
Neutro Abs: 30.9 10*3/uL — ABNORMAL HIGH (ref 1.7–7.7)
Neutrophils Relative %: 94 % — ABNORMAL HIGH (ref 43–77)
Platelets: 238 10*3/uL (ref 150–400)
RBC: 4.21 MIL/uL — AB (ref 4.22–5.81)
RDW: 15.4 % (ref 11.5–15.5)
WBC: 32.9 10*3/uL — ABNORMAL HIGH (ref 4.0–10.5)

## 2014-11-29 LAB — GLUCOSE, CAPILLARY
GLUCOSE-CAPILLARY: 103 mg/dL — AB (ref 70–99)
GLUCOSE-CAPILLARY: 91 mg/dL (ref 70–99)
GLUCOSE-CAPILLARY: 92 mg/dL (ref 70–99)
GLUCOSE-CAPILLARY: 110 mg/dL — AB (ref 70–99)
Glucose-Capillary: 97 mg/dL (ref 70–99)
Glucose-Capillary: 132 mg/dL — ABNORMAL HIGH (ref 70–99)
Glucose-Capillary: 109 mg/dL — ABNORMAL HIGH (ref 70–99)

## 2014-11-29 LAB — RENAL FUNCTION PANEL
ALBUMIN: 2.5 g/dL — AB (ref 3.5–5.2)
Anion gap: 7 (ref 5–15)
BUN: 27 mg/dL — AB (ref 6–23)
CO2: 25 mmol/L (ref 19–32)
CREATININE: 3.35 mg/dL — AB (ref 0.50–1.35)
Calcium: 7.6 mg/dL — ABNORMAL LOW (ref 8.4–10.5)
Chloride: 103 mmol/L (ref 96–112)
GFR calc Af Amer: 21 mL/min — ABNORMAL LOW (ref >90)
GFR, EST NON AFRICAN AMERICAN: 18 mL/min — AB (ref >90)
Glucose, Bld: 112 mg/dL — ABNORMAL HIGH (ref 70–99)
Phosphorus: 5.3 mg/dL — ABNORMAL HIGH (ref 2.3–4.6)
Potassium: 5.8 mmol/L — ABNORMAL HIGH (ref 3.5–5.1)
Sodium: 135 mmol/L (ref 135–145)

## 2014-11-29 LAB — CULTURE, RESPIRATORY

## 2014-11-29 LAB — BASIC METABOLIC PANEL
ANION GAP: 9 (ref 5–15)
BUN: 32 mg/dL — ABNORMAL HIGH (ref 6–23)
CALCIUM: 7.5 mg/dL — AB (ref 8.4–10.5)
CO2: 23 mmol/L (ref 19–32)
Chloride: 102 mmol/L (ref 96–112)
Creatinine, Ser: 3.91 mg/dL — ABNORMAL HIGH (ref 0.50–1.35)
GFR calc Af Amer: 17 mL/min — ABNORMAL LOW (ref >90)
GFR, EST NON AFRICAN AMERICAN: 15 mL/min — AB (ref >90)
Glucose, Bld: 100 mg/dL — ABNORMAL HIGH (ref 70–99)
Potassium: 5.6 mmol/L — ABNORMAL HIGH (ref 3.5–5.1)
Sodium: 134 mmol/L — ABNORMAL LOW (ref 135–145)

## 2014-11-29 LAB — VANCOMYCIN, RANDOM: VANCOMYCIN RM: 23 ug/mL

## 2014-11-29 LAB — CULTURE, RESPIRATORY W GRAM STAIN: Special Requests: NORMAL

## 2014-11-29 LAB — MAGNESIUM: MAGNESIUM: 1.8 mg/dL (ref 1.5–2.5)

## 2014-11-29 LAB — PROCALCITONIN: PROCALCITONIN: 56.94 ng/mL

## 2014-11-29 MED ORDER — VITAL HIGH PROTEIN PO LIQD
1000.0000 mL | ORAL | Status: DC
  Filled 2014-11-29: qty 1000

## 2014-11-29 MED ORDER — PRO-STAT SUGAR FREE PO LIQD
30.0000 mL | Freq: Three times a day (TID) | ORAL | Status: DC
  Administered 2014-11-29 – 2014-12-06 (×17): 30 mL
  Filled 2014-11-29 (×33): qty 30

## 2014-11-29 MED ORDER — VITAL HIGH PROTEIN PO LIQD
1000.0000 mL | ORAL | Status: DC
  Administered 2014-11-29: 1000 mL
  Administered 2014-11-30 (×3)
  Filled 2014-11-29: qty 1000

## 2014-11-29 MED ORDER — HEPARIN SODIUM (PORCINE) 5000 UNIT/ML IJ SOLN
5000.0000 [IU] | Freq: Three times a day (TID) | INTRAMUSCULAR | Status: DC
  Administered 2014-11-29 – 2014-12-19 (×50): 5000 [IU] via SUBCUTANEOUS
  Filled 2014-11-29 (×60): qty 1

## 2014-11-29 NOTE — Progress Notes (Signed)
The patient has lots of support from his family. The family asked me to pray with them.  I have witnessed some of them come to grips with what is happening while others are still trying to figure things out. Overall they are standing strong and doing well as the patient is holding on.

## 2014-11-29 NOTE — Progress Notes (Signed)
Referring Physician(s): CCS  Subjective: Sedated on vent  Allergies: Review of patient's allergies indicates no known allergies.  Medications: Prior to Admission medications   Medication Sig Start Date End Date Taking? Authorizing Provider  albuterol (VENTOLIN HFA) 108 (90 BASE) MCG/ACT inhaler Inhale 2 puffs into the lungs every 4 (four) hours as needed for wheezing or shortness of breath. 10/09/14  Yes Collene Gobble, MD  benzonatate (TESSALON) 200 MG capsule Take 200 mg by mouth 3 (three) times daily as needed for cough.   Yes Historical Provider, MD  DULERA 200-5 MCG/ACT AERO INHALE 2 PUFFS INTO THE LUNGS 2 (TWO) TIMES DAILY. 11/12/14  Yes Collene Gobble, MD  tiotropium (SPIRIVA HANDIHALER) 18 MCG inhalation capsule Place 1 capsule (18 mcg total) into inhaler and inhale daily. 11/11/14  Yes Ernestina Patches, MD  albuterol (PROVENTIL) (2.5 MG/3ML) 0.083% nebulizer solution Take 3 mLs (2.5 mg total) by nebulization every 6 (six) hours as needed for wheezing or shortness of breath. Patient not taking: Reported on 11/11/2014 09/18/14   Caren Griffins, MD  azithromycin (ZITHROMAX) 250 MG tablet 250mg  daily starting tomorrow (11/11/14), for 4 day Patient not taking: Reported on 11/19/2014 11/11/14   Ernestina Patches, MD  chlorpheniramine (CHLOR-TRIMETON) 4 MG tablet Take 1 tablet (4 mg total) by mouth every 6 (six) hours as needed for allergies. Patient not taking: Reported on 09/16/2014 04/18/14   Collene Gobble, MD  fluticasone (FLONASE) 50 MCG/ACT nasal spray PLACE 2 SPRAYS INTO THE NOSE 2 (TWO) TIMES DAILY. Patient not taking: Reported on 09/16/2014 07/24/14   Collene Gobble, MD  mometasone-formoterol [facility]) 200-5 MCG/ACT AERO Inhale 2 puffs into the lungs 2 (two) times daily. 11/11/14   Ernestina Patches, MD  nicotine (NICODERM CQ - DOSED IN MG/24 HOURS) 21 mg/24hr patch Place 1 patch (21 mg total) onto the skin daily. Patient not taking: Reported on 11/11/2014 09/18/14   Caren Griffins, MD    predniSONE (DELTASONE) 20 MG tablet 3 tabs po daily x 3 days, then 2 tabs x 3 days, then 1.5 tabs x 3 days, then 1 tab x 3 days, then 0.5 tabs x 3 days 11/11/14   Ernestina Patches, MD  [facility] HANDIHALER 18 MCG inhalation capsule USE 1 PUFF DAILY Patient not taking: Reported on 11/27/2014 11/21/14   Collene Gobble, MD   Vital Signs: BP 99/44 mmHg  Pulse 89  Temp(Src) 97.9 F (36.6 C) (Oral)  Resp 12  Ht 5\' 11"  (1.803 m)  Wt 267 lb 13.7 oz (121.5 kg)  BMI 37.38 kg/m2  SpO2 92%  Physical Exam General: Intubated, sedated Abd: Soft, RUQ drain intact with bilious output-surrounding erythema improved, 24 hr 15 cc, Cx gram (-) rods/gram (+) cocci in pairs/pending  Imaging: Ct Abdomen Pelvis W Contrast  11/27/2014   CLINICAL DATA:  64 year old male with history of cholecystitis status post placement of percutaneous cholecystostomy tube. The tube was subsequently displaced. Patient having recurrent pain at the tube insertion site.  EXAM: CT ABDOMEN AND PELVIS WITH CONTRAST  TECHNIQUE: Multidetector CT imaging of the abdomen and pelvis was performed using the standard protocol following bolus administration of intravenous contrast.  CONTRAST:  69mL OMNIPAQUE IOHEXOL 300 MG/ML SOLN, 119mL OMNIPAQUE IOHEXOL 300 MG/ML SOLN  COMPARISON:  Most recent prior CT abdomen/ pelvis 11/19/2014  FINDINGS: Lower Chest: Centrilobular emphysema. Mild lower lobe atelectasis. Heart is within normal limits for size. No pericardial effusion. Small hiatal hernia.  Abdomen: Unremarkable CT appearance of the stomach, duodenum, spleen, adrenal  glands and pancreas. Normal hepatic contour and morphology. Mild gallbladder distention. There is slight hyper enhancement and thickening of the gallbladder wall with mild inflammatory stranding in the pericholecystic fat. Additionally, there is increased prominence of the soft tissue tract at the site of the prior gallbladder drain. There is small volume fluid tracking along the tracks with  increased soft tissue thickening and inflammatory stranding.  Unremarkable appearance of the bilateral kidneys. No focal solid lesion, hydronephrosis or nephrolithiasis. 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 No evidence of obstruction or focal bowel wall thickening. Normal appendix in the right lower quadrant. The terminal ileum is unremarkable.  Pelvis: Unremarkable bladder, prostate gland and seminal vesicles. No free fluid or suspicious adenopathy.  Bones/Soft Tissues: No acute fracture or aggressive appearing lytic or blastic osseous lesion. Chronic bilateral L5 pars defects. No associated anterolisthesis.  Vascular: Atherosclerotic vascular disease without significant stenosis or aneurysmal dilatation.  IMPRESSION: 1. Developing recurrent gallbladder distention, wall thickening and hyper enhancement concerning for recurrent acute cholecystitis. 2. There is also progressive inflammatory stranding along the tract from the prior percutaneous cholecystostomy tube. The tract contains a small volume of fluid and communicates directly with the gallbladder lumen via a short transhepatic course. This is concerning for bile leaking into the tract. Query clinical history of bile leakage from the skin insertion site? Alternately, this could represent an abscess developing within the prior tube tract with secondary inflammatory stranding of the gallbladder. This is considered less likely. Nuclear medicine HIDA scan could further evaluate to confirm recurrent obstruction of the cystic duct.   Electronically Signed   By: Jacqulynn Cadet M.D.   On: 11/27/2014 01:05   US Renal  11/28/2014   CLINICAL DATA:  64 year old male with renal failure  EXAM: RENAL / URINARY TRACT ULTRASOUND COMPLETE  COMPARISON:  Prior CT abdomen/pelvis 11/27/2014  FINDINGS: Right Kidney:  Length: 11.7 cm. Echogenicity within normal limits. No mass or hydronephrosis visualized.  Left Kidney:  Length: 14 cm.  Echogenicity within normal limits. No mass or hydronephrosis visualized.  Bladder:  Appears normal for degree of bladder distention.  Other: Incidental note is made of small volume perihepatic ascites. The liver is diffusely heterogeneous in appearance.  IMPRESSION: 1. No evidence of hydronephrosis or other renal abnormality. 2. Incidental note is made of small volume perihepatic ascites.   Electronically Signed   By: Jacqulynn Cadet M.D.   On: 11/28/2014 21:05   US Abdomen Limited  11/28/2014   CLINICAL DATA:  64 year old with sepsis and cholecystitis. History of prior cholecystostomy tube.  EXAM: REPLACEMENT OF CHOLECYSTOSTOMY TUBE THROUGH EXISTING TRACT WITH ULTRASOUND AND FLUOROSCOPIC GUIDANCE  Physician: Stephan Minister. Henn, MD  FLUOROSCOPY TIME:  42 seconds, 48 mGy.  CONTRAST:  5 mL Omnipaque 300  MEDICATIONS: None  ANESTHESIA/SEDATION: Moderate sedation time: None  PROCEDURE: Informed consent was obtained from the patient's wife. Patient was placed supine on the interventional table. The right upper abdomen was evaluated with ultrasound. The gallbladder was small with a small amount of fluid. A hypoechoic tract was identified from the skin to gallbladder. In addition, the skin just above this tract was red and raised. The right abdomen was prepped and draped in sterile fashion. Maximal barrier sterile technique was utilized including caps, mask, sterile gowns, sterile gloves, sterile drape, hand hygiene and skin antiseptic. The area of redness was anesthetized with 1% lidocaine. A small incision was made and a 5 French catheter was advanced down the tract with ultrasound guidance. A large amount  of bloody purulent fluid started to drained around the catheter. The area of skin fullness decompressed following this drainage. Ultrasound demonstrated that the 5 French drain was actually in the gallbladder. Small amount of contrast under fluoroscopy confirmed placement in the gallbladder. A J wire was placed and a 10  [place] multi-purpose drain was advanced over the wire. Contrast injection again confirmed placement in the gallbladder. 30 mL of thick purulent green fluid was aspirated. Sample sent for culture. Catheter was sutured to the skin and attached to gravity bag. Bandage was placed.  FINDINGS: The old drain site was healed but this area was erythematous and raised. Ultrasound demonstrated a hypoechoic tract between the old skin site and the gallbladder. After making an incision at the old drain site, purulent fluid immediately drained to the skin. A 5 French catheter easily advanced from the skin site to the gallbladder. 10 French drain was confirmed within the gallbladder. 30 mL of purulent fluid was aspirated.  Estimated blood loss: Minimal  COMPLICATIONS: None  IMPRESSION: Successful replacement of the percutaneous cholecystostomy tube through the old drain tract.  Subcutaneous abscess along the old drain tract which was successfully decompressed during this procedure.   Electronically Signed   By: Markus Daft M.D.   On: 11/27/2014 17:30   Ir Perc Cholecystostomy  11/27/2014   CLINICAL DATA:  64 year old with sepsis and cholecystitis. History of prior cholecystostomy tube.  EXAM: REPLACEMENT OF CHOLECYSTOSTOMY TUBE THROUGH EXISTING TRACT WITH ULTRASOUND AND FLUOROSCOPIC GUIDANCE  Physician: Stephan Minister. Henn, MD  FLUOROSCOPY TIME:  42 seconds, 48 mGy.  CONTRAST:  5 mL Omnipaque 300  MEDICATIONS: None  ANESTHESIA/SEDATION: Moderate sedation time: None  PROCEDURE: Informed consent was obtained from the patient's wife. Patient was placed supine on the interventional table. The right upper abdomen was evaluated with ultrasound. The gallbladder was small with a small amount of fluid. A hypoechoic tract was identified from the skin to gallbladder. In addition, the skin just above this tract was red and raised. The right abdomen was prepped and draped in sterile fashion. Maximal barrier sterile technique was utilized including  caps, mask, sterile gowns, sterile gloves, sterile drape, hand hygiene and skin antiseptic. The area of redness was anesthetized with 1% lidocaine. A small incision was made and a 5 French catheter was advanced down the tract with ultrasound guidance. A large amount of bloody purulent fluid started to drained around the catheter. The area of skin fullness decompressed following this drainage. Ultrasound demonstrated that the 5 French drain was actually in the gallbladder. Small amount of contrast under fluoroscopy confirmed placement in the gallbladder. A J wire was placed and a 10 [place] multi-purpose drain was advanced over the wire. Contrast injection again confirmed placement in the gallbladder. 30 mL of thick purulent green fluid was aspirated. Sample sent for culture. Catheter was sutured to the skin and attached to gravity bag. Bandage was placed.  FINDINGS: The old drain site was healed but this area was erythematous and raised. Ultrasound demonstrated a hypoechoic tract between the old skin site and the gallbladder. After making an incision at the old drain site, purulent fluid immediately drained to the skin. A 5 French catheter easily advanced from the skin site to the gallbladder. 10 French drain was confirmed within the gallbladder. 30 mL of purulent fluid was aspirated.  Estimated blood loss: Minimal  COMPLICATIONS: None  IMPRESSION: Successful replacement of the percutaneous cholecystostomy tube through the old drain tract.  Subcutaneous abscess along the old drain tract which  was successfully decompressed during this procedure.   Electronically Signed   By: Markus Daft M.D.   On: 11/27/2014 17:30   Dg Chest Port 1 View  11/29/2014   CLINICAL DATA:  Acute respiratory failure.  Pneumonia.  EXAM: PORTABLE CHEST - 1 VIEW  COMPARISON:  11/28/2014  FINDINGS: Support apparatus: Unchanged, consisting of RIGHT IJ vas cath, endotracheal tube, LEFT IJ central line and enteric tube. Monitoring leads project  over the chest.  Cardiomediastinal Silhouette:  Unchanged.  Lungs: Left-greater-than-right interstitial and basilar airspace opacity. Superimposed atelectasis. Findings are more suggestive a pulmonary edema than pneumonia. No pneumothorax. When compared to yesterday's exam, aeration at the lung bases is slightly worse, with increasing atelectasis.  Effusions:  None.  Other:  None.  IMPRESSION: 1. Stable support apparatus. 2. Increasing basilar opacity likely due to atelectasis.   Electronically Signed   By: Dereck Ligas M.D.   On: 11/29/2014 07:31   Dg Chest Port 1 View  11/28/2014   CLINICAL DATA:  Acute respiratory failure.  EXAM: PORTABLE CHEST - 1 VIEW  COMPARISON:  11/28/2014 at 0458 hours  FINDINGS: Endotracheal tube remains in place and as well above the carina. Right jugular central venous catheter has been placed and terminates over the upper SVC. Left jugular catheter remains in place, terminating over the upper SVC. Enteric tube courses into the left upper abdomen with tip not imaged. Pulmonary vascular congestion bilateral interstitial opacities overall appear slightly worse compared to the prior study. No pleural effusion or pneumothorax is identified. Cardiomediastinal silhouette is unchanged.  IMPRESSION: 1. New right jugular catheter terminates over the upper SVC. 2. Slight worsening of interstitial edema.   Electronically Signed   By: Logan Bores   On: 11/28/2014 16:20   Dg Chest Port 1 View  11/28/2014   CLINICAL DATA:  Respiratory failure, COPD, septic shock.  EXAM: PORTABLE CHEST - 1 VIEW  COMPARISON:  Portable chest x-ray of November 27, 2014  FINDINGS: The lungs are adequately inflated. The interstitial markings are coarse. There is no alveolar infiltrate. There is no pleural effusion. The cardiac silhouette is mildly enlarged. The pulmonary vascularity is mildly prominent centrally. The endotracheal tube tip projects 4.4 cm above the carina. The esophagogastric tube tip projects below  the inferior margin of the image. The left internal jugular venous catheter tip projects over the proximal SVC.  IMPRESSION: Slight interval increase in pulmonary interstitial edema. There is no alveolar pneumonia nor significant pleural effusion. The support tubes are in appropriate position.   Electronically Signed   By: David  Martinique M.D.   On: 11/28/2014 07:31   Dg Chest Port 1 View  11/27/2014   CLINICAL DATA:  Respiratory failure.  EXAM: PORTABLE CHEST - 1 VIEW  COMPARISON:  One-view chest x-ray from the same day at 4:52 a.m.  FINDINGS: The patient is now intubated. The endotracheal tube terminates 6 cm above the carina. A left IJ line has been placed. The tip is in the mid SVC. There is no pneumothorax. An NG tube courses off the inferior border of the film.  The heart size is normal. Mild pulmonary vascular congestion is stable. Atherosclerotic calcifications are again noted within the aortic arch. The lungs are otherwise clear.  IMPRESSION: 1. Interval intubation. Satisfactory positioning of the endotracheal tube. 2. Satisfactory positioning of a new left IJ line without radiographic evidence for complication. 3. Atherosclerosis. 4. Stable pulmonary vascular congestion.   Electronically Signed   By: San Morelle M.D.   On: 11/27/2014  09:31   Dg Chest Port 1 View  11/27/2014   CLINICAL DATA:  Acute onset of shortness of breath. Initial encounter.  EXAM: PORTABLE CHEST - 1 VIEW  COMPARISON:  Chest radiograph performed 11/25/2014  FINDINGS: The lungs are well-aerated. Vascular congestion is noted, with mildly increased interstitial markings, possibly reflecting mild interstitial edema. As noted on prior CT, pneumonia could have a similar appearance. No pleural effusion or pneumothorax is seen.  The cardiomediastinal silhouette is within normal limits. No acute osseous abnormalities are seen.  IMPRESSION: Vascular congestion, with mildly increased interstitial markings, possibly reflecting mild  interstitial edema. As noted on prior CT, pneumonia could have a similar appearance.   Electronically Signed   By: Garald Balding M.D.   On: 11/27/2014 06:01   Dg Chest Port 1 View  11/30/2014   CLINICAL DATA:  Patient is white states he had pneumonia with percutaneous drain that fell out. Drain was on right side of abdomen. Shortness of breath.  EXAM: PORTABLE CHEST - 1 VIEW  COMPARISON:  11/19/2014  FINDINGS: Patient slightly rotated to the right. Lungs are adequately inflated with mild prominence of the perihilar markings suggesting mild vascular congestion. No focal lobar consolidation or effusion. Cardiomediastinal silhouette and remainder of the exam is unchanged.  IMPRESSION: Findings suggesting mild vascular congestion.   Electronically Signed   By: Marin Olp M.D.   On: 11/13/2014 22:05    Labs:  CBC:  Recent Labs  11/25/2014 2154 11/27/14 0320 11/28/14 0310 11/29/14 0420  WBC 22.3* 23.0* 37.3* 32.9*  HGB 15.2 14.6 14.7 13.0  HCT 44.9 44.5 45.3 41.2  PLT 398 365 308 238    COAGS:  Recent Labs  01/25/14 1453 04/04/14 1028 11/27/14 0525 11/28/14 0310  INR 1.17 1.04 1.08 1.36  APTT 47* 34 30 38*    BMP:  Recent Labs  11/28/14 1010 11/28/14 1423 11/28/14 2130 11/29/14 0420  NA 134* 136 135 134*  K 6.6* 6.0* 6.0* 5.6*  CL 102 103 103 102  CO2 24 23 24 23   GLUCOSE 97 129* 98 100*  BUN 39* 42* 36* 32*  CALCIUM 7.7* 8.1* 7.6* 7.5*  CREATININE 4.22* 4.50* 4.07* 3.91*  GFRNONAA 14* 13* 14* 15*  GFRAA 16* 15* 17* 17*    LIVER FUNCTION TESTS:  Recent Labs  04/04/14 1028 11/19/14 0412 11/28/2014 2154 11/27/14 0320 11/28/14 2130  BILITOT 0.4 1.1 0.4 0.8  --   AST 17 37 18 18  --   ALT 33 44 21 20  --   ALKPHOS 84 81 72 70  --   PROT 7.8 7.1 7.4 6.8  --   ALBUMIN 3.6 3.9 3.7 3.3* 2.6*    Assessment and Plan: Sepsis, Blood Cx gram (-) rods, wbc 32.9 (37.3) Recurrent cholecystitis S/p new placement of perc cholecystostomy drain 4/26 and decompression  of surrounding subcutaneous abscess, Cx gram (-) rods/gram (+) cocci in pairs/pending Respiratory failure, vent dependent Acute renal failure on CRRT Plans per CCS/CCM/Nephrology   Signed: Hedy Jacob 11/29/2014, 2:27 PM   I spent a total of 15 Minutes in face to face in clinical consultation/evaluation, greater than 50% of which was counseling/coordinating care for cholecystitis.

## 2014-11-29 NOTE — Progress Notes (Signed)
CVVHD line found to have air in it at 1135. Attempted to remove air but was unsuccessful. Filter changed and CRRT was resumed at 1220.

## 2014-11-29 NOTE — Progress Notes (Signed)
Patient ID: DALONTE HARDAGE, male   DOB: 09-27-1950, 64 y.o.   MRN: [id number]  [facility] Progress Note    Assessment/ Plan:   1. Septic shock secondary to gallbladder abscess/cholecystitis: Remains on pressors with adjustments to antibiotic therapy noted overnight-addition of ciprofloxacin for possible Pseudomonas coverage. 2. Acute renal failure: Secondary to septic shock and contrast-induced nephropathy-remains anuric for which we will continue him on CRRT and gentle ultrafiltration as permitted by blood pressures. The goal at this time is to provide clearanceand regulation of metabolic abnormalities. 3. Hyperkalemia: Secondary to acute renal failure, improving with CRRT. 4. Polycythemia: Chronic and secondary to underlying COPD/chronic hypoxia. He has an outpatient follow-up with hematology. 5. Chronic obstructive lung disease: Currently on ventilator-further management per CCM.  Subjective:   Overnight events noted including initiation of CRRT and adjustment of antibiotic therapy.   Objective:   BP 95/36 mmHg  Pulse 90  Temp(Src) 97.9 F (36.6 C) (Oral)  Resp 16  Ht 5\' 11"  (1.803 m)  Wt 121.5 kg (267 lb 13.7 oz)  BMI 37.38 kg/m2  SpO2 92%  Intake/Output Summary (Last 24 hours) at 11/29/14 1233 Last data filed at 11/29/14 1200  Gross per 24 hour  Intake 3568.93 ml  Output   3084 ml  Net 484.93 ml   Weight change: -1.2 kg (-2 lb 10.3 oz)  Physical Exam: DJT:TSVXBLT, intubated, eyes open but unresponsive  JQZ:ESPQZ regular in rate and rhythm, S1 and S2 normal  Resp:Coarse/mechanical breath sounds bilaterally  RAQ:TMAU, obese, nontender, right upper quadrant drain in situ  Ext: Trace lower extremity edema   Imaging: US Renal  11/28/2014   CLINICAL DATA:  64 year old male with renal failure  EXAM: RENAL / URINARY TRACT ULTRASOUND COMPLETE  COMPARISON:  Prior CT abdomen/pelvis 11/27/2014  FINDINGS: Right Kidney:  Length: 11.7 cm. Echogenicity within normal  limits. No mass or hydronephrosis visualized.  Left Kidney:  Length: 14 cm. Echogenicity within normal limits. No mass or hydronephrosis visualized.  Bladder:  Appears normal for degree of bladder distention.  Other: Incidental note is made of small volume perihepatic ascites. The liver is diffusely heterogeneous in appearance.  IMPRESSION: 1. No evidence of hydronephrosis or other renal abnormality. 2. Incidental note is made of small volume perihepatic ascites.   Electronically Signed   By: Jacqulynn Cadet M.D.   On: 11/28/2014 21:05   US Abdomen Limited  11/28/2014   CLINICAL DATA:  64 year old with sepsis and cholecystitis. History of prior cholecystostomy tube.  EXAM: REPLACEMENT OF CHOLECYSTOSTOMY TUBE THROUGH EXISTING TRACT WITH ULTRASOUND AND FLUOROSCOPIC GUIDANCE  Physician: Stephan Minister. Henn, MD  FLUOROSCOPY TIME:  42 seconds, 48 mGy.  CONTRAST:  5 mL Omnipaque 300  MEDICATIONS: None  ANESTHESIA/SEDATION: Moderate sedation time: None  PROCEDURE: Informed consent was obtained from the patient's wife. Patient was placed supine on the interventional table. The right upper abdomen was evaluated with ultrasound. The gallbladder was small with a small amount of fluid. A hypoechoic tract was identified from the skin to gallbladder. In addition, the skin just above this tract was red and raised. The right abdomen was prepped and draped in sterile fashion. Maximal barrier sterile technique was utilized including caps, mask, sterile gowns, sterile gloves, sterile drape, hand hygiene and skin antiseptic. The area of redness was anesthetized with 1% lidocaine. A small incision was made and a 5 French catheter was advanced down the tract with ultrasound guidance. A large amount of bloody purulent fluid started to drained around the catheter. The area  of skin fullness decompressed following this drainage. Ultrasound demonstrated that the 5 French drain was actually in the gallbladder. Small amount of contrast under  fluoroscopy confirmed placement in the gallbladder. A J wire was placed and a 10 [place] multi-purpose drain was advanced over the wire. Contrast injection again confirmed placement in the gallbladder. 30 mL of thick purulent green fluid was aspirated. Sample sent for culture. Catheter was sutured to the skin and attached to gravity bag. Bandage was placed.  FINDINGS: The old drain site was healed but this area was erythematous and raised. Ultrasound demonstrated a hypoechoic tract between the old skin site and the gallbladder. After making an incision at the old drain site, purulent fluid immediately drained to the skin. A 5 French catheter easily advanced from the skin site to the gallbladder. 10 French drain was confirmed within the gallbladder. 30 mL of purulent fluid was aspirated.  Estimated blood loss: Minimal  COMPLICATIONS: None  IMPRESSION: Successful replacement of the percutaneous cholecystostomy tube through the old drain tract.  Subcutaneous abscess along the old drain tract which was successfully decompressed during this procedure.   Electronically Signed   By: Markus Daft M.D.   On: 11/27/2014 17:30   Ir Perc Cholecystostomy  11/27/2014   CLINICAL DATA:  64 year old with sepsis and cholecystitis. History of prior cholecystostomy tube.  EXAM: REPLACEMENT OF CHOLECYSTOSTOMY TUBE THROUGH EXISTING TRACT WITH ULTRASOUND AND FLUOROSCOPIC GUIDANCE  Physician: Stephan Minister. Henn, MD  FLUOROSCOPY TIME:  42 seconds, 48 mGy.  CONTRAST:  5 mL Omnipaque 300  MEDICATIONS: None  ANESTHESIA/SEDATION: Moderate sedation time: None  PROCEDURE: Informed consent was obtained from the patient's wife. Patient was placed supine on the interventional table. The right upper abdomen was evaluated with ultrasound. The gallbladder was small with a small amount of fluid. A hypoechoic tract was identified from the skin to gallbladder. In addition, the skin just above this tract was red and raised. The right abdomen was prepped and  draped in sterile fashion. Maximal barrier sterile technique was utilized including caps, mask, sterile gowns, sterile gloves, sterile drape, hand hygiene and skin antiseptic. The area of redness was anesthetized with 1% lidocaine. A small incision was made and a 5 French catheter was advanced down the tract with ultrasound guidance. A large amount of bloody purulent fluid started to drained around the catheter. The area of skin fullness decompressed following this drainage. Ultrasound demonstrated that the 5 French drain was actually in the gallbladder. Small amount of contrast under fluoroscopy confirmed placement in the gallbladder. A J wire was placed and a 10 [place] multi-purpose drain was advanced over the wire. Contrast injection again confirmed placement in the gallbladder. 30 mL of thick purulent green fluid was aspirated. Sample sent for culture. Catheter was sutured to the skin and attached to gravity bag. Bandage was placed.  FINDINGS: The old drain site was healed but this area was erythematous and raised. Ultrasound demonstrated a hypoechoic tract between the old skin site and the gallbladder. After making an incision at the old drain site, purulent fluid immediately drained to the skin. A 5 French catheter easily advanced from the skin site to the gallbladder. 10 French drain was confirmed within the gallbladder. 30 mL of purulent fluid was aspirated.  Estimated blood loss: Minimal  COMPLICATIONS: None  IMPRESSION: Successful replacement of the percutaneous cholecystostomy tube through the old drain tract.  Subcutaneous abscess along the old drain tract which was successfully decompressed during this procedure.   Electronically Signed  By: Markus Daft M.D.   On: 11/27/2014 17:30   Dg Chest Port 1 View  11/29/2014   CLINICAL DATA:  Acute respiratory failure.  Pneumonia.  EXAM: PORTABLE CHEST - 1 VIEW  COMPARISON:  11/28/2014  FINDINGS: Support apparatus: Unchanged, consisting of RIGHT IJ vas cath,  endotracheal tube, LEFT IJ central line and enteric tube. Monitoring leads project over the chest.  Cardiomediastinal Silhouette:  Unchanged.  Lungs: Left-greater-than-right interstitial and basilar airspace opacity. Superimposed atelectasis. Findings are more suggestive a pulmonary edema than pneumonia. No pneumothorax. When compared to yesterday's exam, aeration at the lung bases is slightly worse, with increasing atelectasis.  Effusions:  None.  Other:  None.  IMPRESSION: 1. Stable support apparatus. 2. Increasing basilar opacity likely due to atelectasis.   Electronically Signed   By: Dereck Ligas M.D.   On: 11/29/2014 07:31   Dg Chest Port 1 View  11/28/2014   CLINICAL DATA:  Acute respiratory failure.  EXAM: PORTABLE CHEST - 1 VIEW  COMPARISON:  11/28/2014 at 0458 hours  FINDINGS: Endotracheal tube remains in place and as well above the carina. Right jugular central venous catheter has been placed and terminates over the upper SVC. Left jugular catheter remains in place, terminating over the upper SVC. Enteric tube courses into the left upper abdomen with tip not imaged. Pulmonary vascular congestion bilateral interstitial opacities overall appear slightly worse compared to the prior study. No pleural effusion or pneumothorax is identified. Cardiomediastinal silhouette is unchanged.  IMPRESSION: 1. New right jugular catheter terminates over the upper SVC. 2. Slight worsening of interstitial edema.   Electronically Signed   By: Logan Bores   On: 11/28/2014 16:20   Dg Chest Port 1 View  11/28/2014   CLINICAL DATA:  Respiratory failure, COPD, septic shock.  EXAM: PORTABLE CHEST - 1 VIEW  COMPARISON:  Portable chest x-ray of November 27, 2014  FINDINGS: The lungs are adequately inflated. The interstitial markings are coarse. There is no alveolar infiltrate. There is no pleural effusion. The cardiac silhouette is mildly enlarged. The pulmonary vascularity is mildly prominent centrally. The endotracheal tube  tip projects 4.4 cm above the carina. The esophagogastric tube tip projects below the inferior margin of the image. The left internal jugular venous catheter tip projects over the proximal SVC.  IMPRESSION: Slight interval increase in pulmonary interstitial edema. There is no alveolar pneumonia nor significant pleural effusion. The support tubes are in appropriate position.   Electronically Signed   By: David  Martinique M.D.   On: 11/28/2014 07:31    Labs: BMET  Recent Labs Lab 11/22/2014 2154 11/27/14 0320 11/28/14 1010 11/28/14 1423 11/28/14 2130 11/29/14 0420  NA 139 136 134* 136 135 134*  K 4.3 4.3 6.6* 6.0* 6.0* 5.6*  CL 101 103 102 103 103 102  CO2 29 26 24 23 24 23   GLUCOSE 114* 122* 97 129* 98 100*  BUN 22 19 39* 42* 36* 32*  CREATININE 1.01 0.90 4.22* 4.50* 4.07* 3.91*  CALCIUM 9.1 8.5 7.7* 8.1* 7.6* 7.5*  PHOS  --   --   --   --  5.9*  --    CBC  Recent Labs Lab 11/27/2014 2154 11/27/14 0320 11/28/14 0310 11/29/14 0420  WBC 22.3* 23.0* 37.3* 32.9*  NEUTROABS 17.8* 20.0*  --  30.9*  HGB 15.2 14.6 14.7 13.0  HCT 44.9 44.5 45.3 41.2  MCV 94.3 95.3 96.0 97.9  PLT 398 365 308 238    Medications:    . antiseptic oral  rinse  7 mL Mouth Rinse QID  . chlorhexidine  15 mL Mouth Rinse BID  . ciprofloxacin  400 mg Intravenous Q12H  . feeding supplement (VITAL HIGH PROTEIN)  1,000 mL Per Tube Q24H  . fentaNYL (SUBLIMAZE) injection  50 mcg Intravenous Once  . heparin subcutaneous  5,000 Units Subcutaneous 3 times per day  . pantoprazole (PROTONIX) IV  40 mg Intravenous Q24H  . piperacillin-tazobactam  3.375 g Intravenous Q6H  . sodium chloride  3 mL Intravenous Q12H      Elmarie Shiley, MD 11/29/2014, 12:33 PM

## 2014-11-29 NOTE — Progress Notes (Signed)
PULMONARY / CRITICAL CARE MEDICINE   Name: Allen Green MRN: [id number] DOB: May 29, 1951    ADMISSION DATE:  11/22/2014 CONSULTATION DATE: 4/26  REFERRING MD :  [facility]  CHIEF COMPLAINT:  SOB  INITIAL PRESENTATION: 64 y/o male admitted on 4/26 with septic shock and acute respiratory failure due to cholecystitis and an abscess associated with a recently dislodged gallbladder drain.  STUDIES:  4/26 CT Abdomen> acute cholecystitis and gallbladder tract abscess 4/26 ECHO> poor study, can't comment on LV or RV size/function SIGNIFICANT EVENTS: 4/26 intubated, Gallbladder drain placed by IR   SUBJECTIVE:  sedated  VITAL SIGNS: Temp:  [95.9 F (35.5 C)-98.2 F (36.8 C)] 97.2 F (36.2 C) (04/28 0800) Pulse Rate:  [88-120] 93 (04/28 0930) Resp:  [11-19] 14 (04/28 0930) BP: (89-158)/(41-89) 129/47 mmHg (04/28 0930) SpO2:  [87 %-100 %] 92 % (04/28 0930) FiO2 (%):  [40 %] 40 % (04/28 0930) Weight:  [267 lb 13.7 oz (121.5 kg)] 267 lb 13.7 oz (121.5 kg) (04/28 0349) HEMODYNAMICS: CVP:  [7 mmHg-16 mmHg] 10 mmHg VENTILATOR SETTINGS: Vent Mode:  [-] PRVC FiO2 (%):  [40 %] 40 % Set Rate:  [12 bmp] 12 bmp Vt Set:  [600 mL] 600 mL PEEP:  [5 cmH20] 5 cmH20 Pressure Support:  [5 cmH20] 5 cmH20 Plateau Pressure:  [14 cmH20-19 cmH20] 16 cmH20 INTAKE / OUTPUT:  Intake/Output Summary (Last 24 hours) at 11/29/14 0958 Last data filed at 11/29/14 0900  Gross per 24 hour  Intake 3635.07 ml  Output   2564 ml  Net 1071.07 ml    PHYSICAL EXAMINATION: General:  On vent, sedatyed HENT: NCAT, ETT in place PULM: CTA B CV: Tachy, regular, no mgr GI: BS+, soft, drain in place Derm: improved redness, swelling around GB drain, no pus expressed Neuro: sedated   LABS:  CBC  Recent Labs Lab 11/27/14 0320 11/28/14 0310 11/29/14 0420  WBC 23.0* 37.3* 32.9*  HGB 14.6 14.7 13.0  HCT 44.5 45.3 41.2  PLT 365 308 238   Coag's  Recent Labs Lab 11/27/14 0525 11/28/14 0310  APTT 30 38*  INR  1.08 1.36   BMET  Recent Labs Lab 11/28/14 1423 11/28/14 2130 11/29/14 0420  NA 136 135 134*  K 6.0* 6.0* 5.6*  CL 103 103 102  CO2 23 24 23   BUN 42* 36* 32*  CREATININE 4.50* 4.07* 3.91*  GLUCOSE 129* 98 100*   Electrolytes  Recent Labs Lab 11/28/14 1423 11/28/14 2130 11/29/14 0420  CALCIUM 8.1* 7.6* 7.5*  MG  --   --  1.8  PHOS  --  5.9*  --    Sepsis Markers  Recent Labs Lab 11/09/2014 2202 11/27/14 0320 11/28/14 0310 11/29/14 0420  LATICACIDVEN 1.48 1.1 1.9  --   PROCALCITON  --  0.14  --  56.94   ABG  Recent Labs Lab 11/27/14 0656 11/27/14 0930  PHART 7.299* 7.211*  PCO2ART 52.4* 64.4*  PO2ART 117* 386*   Liver Enzymes  Recent Labs Lab 11/15/2014 2154 11/27/14 0320 11/28/14 2130  AST 18 18  --   ALT 21 20  --   ALKPHOS 72 70  --   BILITOT 0.4 0.8  --   ALBUMIN 3.7 3.3* 2.6*   Cardiac Enzymes  Recent Labs Lab 11/27/14 1250 11/27/14 2048 11/28/14 1010  TROPONINI <0.03 <0.03 <0.03   Glucose  Recent Labs Lab 11/28/14 0804 11/28/14 1205 11/28/14 2013 11/28/14 2320 11/29/14 0348 11/29/14 0743  GLUCAP 126* 190* 103* 91 92 97  Imaging    ASSESSMENT / PLAN:  PULMONARY OETT 4/26(PCCM)>> A: Acute respiratory failure with hypercapnea> improved 4/27, passing SBT Severe COPD, but doing surprisingly well on 4/27 AM, so an elective Ex-lap as outpatient for cholecystectomy not unreasonable Tobacco abuse  P:   Hold extubation as overall clinical picture seems a worse (sepsis, AKI, etc) Wean in Pressure support mode as able Vent bundle BD's Wean FIO2  CARDIOVASCULAR CVL 4/26 L IJ CVL>> A:  Septic shock > worse 4/27, echo not helpful,? Cardiogenic component, more septic at this point P:  Check 12 lead Repeat troponin>>neg  procalcitonin>>58 Levophed for MAP > 65  RENAL Lab Results  Component Value Date   CREATININE 3.91* 11/29/2014   CREATININE 4.07* 11/28/2014   CREATININE 4.50* 11/28/2014    Recent Labs Lab  11/28/14 1423 11/28/14 2130 11/29/14 0420  K 6.0* 6.0* 5.6*      A: Oliguric AKI P:   Monitor BMET and UOP Renal consult 4/27 CVVH started  GASTROINTESTINAL A:   Acute Cholecystis > with peri-gallbladder abscess, s/p drain 4/26 per IR Recent IR drain 4/3->4/12  P:   Abx as noted F/U surgery recs Continue Antibiotic support Monitor drain output  HEMATOLOGIC A:   Chronic polycythemia  P:  Follow H/H Monitor for bleeding  INFECTIOUS A:   Septic shock> worse 4/27 likely all related to draining abscess on 4/26, don't suspect secondary source or inappropriate antibiotic coverage at this point, cipro added 4/27 P:   BCx2  4/26>>GNR 2/2 UC  4/26>> Sputum 4/26>>nl flora Wound culture 4/26 > GPC, GNR>> 4/26 c dif>>neg Abx:  4/26 vanc>> 4/26 zoysn>> 4/27 cipro>>  ENDOCRINE A:   No acute issue P:   SSI if needed  NEUROLOGIC A:   Intact prior to intubation P:   RASS goal: -1 Continue fentanyl per PAD protocol   FAMILY  - Updates: updated at length 4/26 and 4/27 AM bedside  - Inter-disciplinary family meet or Palliative Care meeting due by:  day 7  [facility] ACNP [name] PCCM Pager [phone] till 3 pm If no answer page [phone] 11/29/2014, 9:58 AM  Attending:  I have seen and examined the patient with nurse practitioner/resident and agree with the note above.   Allen Green was started on CVVHD yesterday, his blood pressure has improved somewhat  On my exam his lungs are clear today, no wheezing, BS+, soft, drain in place  Septic shock> improving, continue to wean levophed AKI> contrast, shock induced> continue CVVHD Acute respiratory failure> surprisingly good, plan SBT tomorrow if off pressors Cholecystitis with abscess, now drained, bacteremic> continue broad spectrum ABX for now, narrow as cultures come back  My cc time 40 minutes  Roselie Awkward, MD [place] PCCM Pager: [phone] Cell: [phone] If no response, call [phone]

## 2014-11-29 NOTE — Progress Notes (Signed)
NUTRITION FOLLOW UP  Intervention:   Initiate Vital HP @ 20 ml/hr via OGT and increase by 10 ml every 4 hours to goal rate of 50 ml/hr.   30 ml Prostat TID.   Tube feeding regimen provides 1500 kcal (13 kcal/kg actual body weight), 150 grams of protein, and 912 ml of H2O.   Nutrition Dx:   Enteral nutrition to provide 65-70% of estimated calorie needs based on ASPEN guidelines for hypocaloric, high protein feeding in critically ill obese individuals  Goal:   Pt to meet >/= 90% of their estimated nutrition needs; not met   Monitor:   Weight trend, vent status/settings, labs, TF initiation  Assessment:   64 year old Caucasian man with past medical history significant for COPD, GERD and a recently discharged gallbladder drain with development of an abscess and septic shock/acute respiratory failure who presented to the hospital yesterday. Had a CT scan of his abdomen/pelvis with intravenous contrast yesterday to study his area of dislodged gallbladder drain better prior to undergoing decompression of subcutaneous abscess and new drain placement in gallbladder.   - pt with worsening septic shock - drain replacement 4/26 - No signs of fat or muscle depletion - pt on CRRT  - Consult received for TF initiation and management.  - labs and medications reviewed  Na 134  K 5.6  Patient is currently intubated on ventilator support MV: 7.3 L/min Temp (24hrs), Avg:97.1 F (36.2 C), Min:95.9 F (35.5 C), Max:98.1 F (36.7 C)  Propofol: none  Height: Ht Readings from Last 1 Encounters:  11/27/14 $RemoveB'5\' 11"'[id number]$  (1.803 m)    Weight Status:   Wt Readings from Last 1 Encounters:  11/29/14 267 lb 13.7 oz (121.5 kg)    Re-estimated needs:  Kcal: 1944  (8099-8338 = 11-14 kcal/kg actual body weight) Protein: >151 g Fluid: per MD  Skin: intact  Diet Order: Diet NPO time specified   Intake/Output Summary (Last 24 hours) at 11/29/14 1348 Last data filed at 11/29/14 1300  Gross per 24 hour   Intake 3583.2 ml  Output   3250 ml  Net  333.2 ml    Last BM: 4/27   Labs:   Recent Labs Lab 11/28/14 1423 11/28/14 2130 11/29/14 0420  NA 136 135 134*  K 6.0* 6.0* 5.6*  CL 103 103 102  CO2 $Re'23 24 23  'fJl$ BUN 42* 36* 32*  CREATININE 4.50* 4.07* 3.91*  CALCIUM 8.1* 7.6* 7.5*  MG  --   --  1.8  PHOS  --  5.9*  --   GLUCOSE 129* 98 100*    CBG (last 3)   Recent Labs  11/29/14 0348 11/29/14 0743 11/29/14 1155  GLUCAP 92 97 132*    Scheduled Meds: . antiseptic oral rinse  7 mL Mouth Rinse QID  . chlorhexidine  15 mL Mouth Rinse BID  . ciprofloxacin  400 mg Intravenous Q12H  . feeding supplement (VITAL HIGH PROTEIN)  1,000 mL Per Tube Q24H  . fentaNYL (SUBLIMAZE) injection  50 mcg Intravenous Once  . heparin subcutaneous  5,000 Units Subcutaneous 3 times per day  . pantoprazole (PROTONIX) IV  40 mg Intravenous Q24H  . piperacillin-tazobactam  3.375 g Intravenous Q6H  . sodium chloride  3 mL Intravenous Q12H    Continuous Infusions: . sodium chloride 10 mL/hr at 11/28/14 1400  . sodium chloride 1,000 mL (11/28/14 2123)  . sodium chloride 1,000 mL (11/28/14 2000)  . fentaNYL infusion INTRAVENOUS 300 mcg/hr (11/29/14 0918)  . norepinephrine (LEVOPHED) Adult infusion 9  mcg/min (11/29/14 1315)  . dialysis replacement fluid (prismasate) 600 mL/hr at 11/29/14 1110  . dialysis replacement fluid (prismasate) 200 mL/hr at 11/28/14 1745  . dialysate (PRISMASATE) 1,500 mL/hr at 11/29/14 [address] [place], [place], [place]

## 2014-11-29 NOTE — Progress Notes (Signed)
Subjective: He remains gravely ill.  Drain from the [place] looks fine, I don't see any fluid coming around the drain site.  Still a bit red, but most of site is covered with stabilizing dressing.   Objective: Vital signs in last 24 hours: Temp:  [95.9 F (35.5 C)-98.2 F (36.8 C)] 95.9 F (35.5 C) (04/28 0600) Pulse Rate:  [74-120] 89 (04/28 0600) Resp:  [11-19] 12 (04/28 0600) BP: (89-158)/(42-89) 109/42 mmHg (04/28 0600) SpO2:  [81 %-100 %] 94 % (04/28 0600) FiO2 (%):  [40 %] 40 % (04/28 0400) Weight:  [121.5 kg (267 lb 13.7 oz)] 121.5 kg (267 lb 13.7 oz) (04/28 0349)   275 from NG\drain 15 ml 1851 from CRRT Afebrile, hypothermic, BP fairly stable on pressors K+ up to 5.6, creatinine better, 3.91 WBC 32.9 this AM CXR:  Increasing basilar opacity likely due to atelectasis Intake/Output from previous day: 04/27 0701 - 04/28 0700 In: 3248.5 [I.V.:2713.5; IV Piggyback:460] Out: 2296 [Urine:105; Emesis/NG output:275; Drains:15] Intake/Output this shift:    General appearance: sedated on vent, with Mittens in place. GI: large abdomen, green bile coming from drain, site is stable,   Lab Results:   Recent Labs  11/28/14 0310 11/29/14 0420  WBC 37.3* 32.9*  HGB 14.7 13.0  HCT 45.3 41.2  PLT 308 238    BMET  Recent Labs  11/28/14 2130 11/29/14 0420  NA 135 134*  K 6.0* 5.6*  CL 103 102  CO2 24 23  GLUCOSE 98 100*  BUN 36* 32*  CREATININE 4.07* 3.91*  CALCIUM 7.6* 7.5*   PT/INR  Recent Labs  11/27/14 0525 11/28/14 0310  LABPROT 14.1 16.9*  INR 1.08 1.36     Recent Labs Lab 11/20/2014 2154 11/27/14 0320 11/28/14 2130  AST 18 18  --   ALT 21 20  --   ALKPHOS 72 70  --   BILITOT 0.4 0.8  --   PROT 7.4 6.8  --   ALBUMIN 3.7 3.3* 2.6*     Lipase     Component Value Date/Time   LIPASE 20 11/19/2014 0412     Studies/Results: US Renal  11/28/2014   CLINICAL DATA:  64 year old male with renal failure  EXAM: RENAL / URINARY TRACT ULTRASOUND COMPLETE   COMPARISON:  Prior CT abdomen/pelvis 11/27/2014  FINDINGS: Right Kidney:  Length: 11.7 cm. Echogenicity within normal limits. No mass or hydronephrosis visualized.  Left Kidney:  Length: 14 cm. Echogenicity within normal limits. No mass or hydronephrosis visualized.  Bladder:  Appears normal for degree of bladder distention.  Other: Incidental note is made of small volume perihepatic ascites. The liver is diffusely heterogeneous in appearance.  IMPRESSION: 1. No evidence of hydronephrosis or other renal abnormality. 2. Incidental note is made of small volume perihepatic ascites.   Electronically Signed   By: Jacqulynn Cadet M.D.   On: 11/28/2014 21:05   US Abdomen Limited  11/28/2014   CLINICAL DATA:  64 year old with sepsis and cholecystitis. History of prior cholecystostomy tube.  EXAM: REPLACEMENT OF CHOLECYSTOSTOMY TUBE THROUGH EXISTING TRACT WITH ULTRASOUND AND FLUOROSCOPIC GUIDANCE  Physician: Stephan Minister. Henn, MD  FLUOROSCOPY TIME:  42 seconds, 48 mGy.  CONTRAST:  5 mL Omnipaque 300  MEDICATIONS: None  ANESTHESIA/SEDATION: Moderate sedation time: None  PROCEDURE: Informed consent was obtained from the patient's wife. Patient was placed supine on the interventional table. The right upper abdomen was evaluated with ultrasound. The gallbladder was small with a small amount of fluid. A hypoechoic tract was identified from  the skin to gallbladder. In addition, the skin just above this tract was red and raised. The right abdomen was prepped and draped in sterile fashion. Maximal barrier sterile technique was utilized including caps, mask, sterile gowns, sterile gloves, sterile drape, hand hygiene and skin antiseptic. The area of redness was anesthetized with 1% lidocaine. A small incision was made and a 5 French catheter was advanced down the tract with ultrasound guidance. A large amount of bloody purulent fluid started to drained around the catheter. The area of skin fullness decompressed following this  drainage. Ultrasound demonstrated that the 5 French drain was actually in the gallbladder. Small amount of contrast under fluoroscopy confirmed placement in the gallbladder. A J wire was placed and a 10 [place] multi-purpose drain was advanced over the wire. Contrast injection again confirmed placement in the gallbladder. 30 mL of thick purulent green fluid was aspirated. Sample sent for culture. Catheter was sutured to the skin and attached to gravity bag. Bandage was placed.  FINDINGS: The old drain site was healed but this area was erythematous and raised. Ultrasound demonstrated a hypoechoic tract between the old skin site and the gallbladder. After making an incision at the old drain site, purulent fluid immediately drained to the skin. A 5 French catheter easily advanced from the skin site to the gallbladder. 10 French drain was confirmed within the gallbladder. 30 mL of purulent fluid was aspirated.  Estimated blood loss: Minimal  COMPLICATIONS: None  IMPRESSION: Successful replacement of the percutaneous cholecystostomy tube through the old drain tract.  Subcutaneous abscess along the old drain tract which was successfully decompressed during this procedure.   Electronically Signed   By: Markus Daft M.D.   On: 11/27/2014 17:30   Ir Perc Cholecystostomy  11/27/2014   CLINICAL DATA:  64 year old with sepsis and cholecystitis. History of prior cholecystostomy tube.  EXAM: REPLACEMENT OF CHOLECYSTOSTOMY TUBE THROUGH EXISTING TRACT WITH ULTRASOUND AND FLUOROSCOPIC GUIDANCE  Physician: Stephan Minister. Henn, MD  FLUOROSCOPY TIME:  42 seconds, 48 mGy.  CONTRAST:  5 mL Omnipaque 300  MEDICATIONS: None  ANESTHESIA/SEDATION: Moderate sedation time: None  PROCEDURE: Informed consent was obtained from the patient's wife. Patient was placed supine on the interventional table. The right upper abdomen was evaluated with ultrasound. The gallbladder was small with a small amount of fluid. A hypoechoic tract was identified from the  skin to gallbladder. In addition, the skin just above this tract was red and raised. The right abdomen was prepped and draped in sterile fashion. Maximal barrier sterile technique was utilized including caps, mask, sterile gowns, sterile gloves, sterile drape, hand hygiene and skin antiseptic. The area of redness was anesthetized with 1% lidocaine. A small incision was made and a 5 French catheter was advanced down the tract with ultrasound guidance. A large amount of bloody purulent fluid started to drained around the catheter. The area of skin fullness decompressed following this drainage. Ultrasound demonstrated that the 5 French drain was actually in the gallbladder. Small amount of contrast under fluoroscopy confirmed placement in the gallbladder. A J wire was placed and a 10 [place] multi-purpose drain was advanced over the wire. Contrast injection again confirmed placement in the gallbladder. 30 mL of thick purulent green fluid was aspirated. Sample sent for culture. Catheter was sutured to the skin and attached to gravity bag. Bandage was placed.  FINDINGS: The old drain site was healed but this area was erythematous and raised. Ultrasound demonstrated a hypoechoic tract between the old skin site and the  gallbladder. After making an incision at the old drain site, purulent fluid immediately drained to the skin. A 5 French catheter easily advanced from the skin site to the gallbladder. 10 French drain was confirmed within the gallbladder. 30 mL of purulent fluid was aspirated.  Estimated blood loss: Minimal  COMPLICATIONS: None  IMPRESSION: Successful replacement of the percutaneous cholecystostomy tube through the old drain tract.  Subcutaneous abscess along the old drain tract which was successfully decompressed during this procedure.   Electronically Signed   By: Markus Daft M.D.   On: 11/27/2014 17:30   Dg Chest Port 1 View  11/29/2014   CLINICAL DATA:  Acute respiratory failure.  Pneumonia.  EXAM:  PORTABLE CHEST - 1 VIEW  COMPARISON:  11/28/2014  FINDINGS: Support apparatus: Unchanged, consisting of RIGHT IJ vas cath, endotracheal tube, LEFT IJ central line and enteric tube. Monitoring leads project over the chest.  Cardiomediastinal Silhouette:  Unchanged.  Lungs: Left-greater-than-right interstitial and basilar airspace opacity. Superimposed atelectasis. Findings are more suggestive a pulmonary edema than pneumonia. No pneumothorax. When compared to yesterday's exam, aeration at the lung bases is slightly worse, with increasing atelectasis.  Effusions:  None.  Other:  None.  IMPRESSION: 1. Stable support apparatus. 2. Increasing basilar opacity likely due to atelectasis.   Electronically Signed   By: Dereck Ligas M.D.   On: 11/29/2014 07:31   Dg Chest Port 1 View  11/28/2014   CLINICAL DATA:  Acute respiratory failure.  EXAM: PORTABLE CHEST - 1 VIEW  COMPARISON:  11/28/2014 at 0458 hours  FINDINGS: Endotracheal tube remains in place and as well above the carina. Right jugular central venous catheter has been placed and terminates over the upper SVC. Left jugular catheter remains in place, terminating over the upper SVC. Enteric tube courses into the left upper abdomen with tip not imaged. Pulmonary vascular congestion bilateral interstitial opacities overall appear slightly worse compared to the prior study. No pleural effusion or pneumothorax is identified. Cardiomediastinal silhouette is unchanged.  IMPRESSION: 1. New right jugular catheter terminates over the upper SVC. 2. Slight worsening of interstitial edema.   Electronically Signed   By: Logan Bores   On: 11/28/2014 16:20   Dg Chest Port 1 View  11/28/2014   CLINICAL DATA:  Respiratory failure, COPD, septic shock.  EXAM: PORTABLE CHEST - 1 VIEW  COMPARISON:  Portable chest x-ray of November 27, 2014  FINDINGS: The lungs are adequately inflated. The interstitial markings are coarse. There is no alveolar infiltrate. There is no pleural effusion.  The cardiac silhouette is mildly enlarged. The pulmonary vascularity is mildly prominent centrally. The endotracheal tube tip projects 4.4 cm above the carina. The esophagogastric tube tip projects below the inferior margin of the image. The left internal jugular venous catheter tip projects over the proximal SVC.  IMPRESSION: Slight interval increase in pulmonary interstitial edema. There is no alveolar pneumonia nor significant pleural effusion. The support tubes are in appropriate position.   Electronically Signed   By: David  Martinique M.D.   On: 11/28/2014 07:31   Dg Chest Port 1 View  11/27/2014   CLINICAL DATA:  Respiratory failure.  EXAM: PORTABLE CHEST - 1 VIEW  COMPARISON:  One-view chest x-ray from the same day at 4:52 a.m.  FINDINGS: The patient is now intubated. The endotracheal tube terminates 6 cm above the carina. A left IJ line has been placed. The tip is in the mid SVC. There is no pneumothorax. An NG tube courses off the inferior border  of the film.  The heart size is normal. Mild pulmonary vascular congestion is stable. Atherosclerotic calcifications are again noted within the aortic arch. The lungs are otherwise clear.  IMPRESSION: 1. Interval intubation. Satisfactory positioning of the endotracheal tube. 2. Satisfactory positioning of a new left IJ line without radiographic evidence for complication. 3. Atherosclerosis. 4. Stable pulmonary vascular congestion.   Electronically Signed   By: San Morelle M.D.   On: 11/27/2014 09:31    Medications: . antiseptic oral rinse  7 mL Mouth Rinse QID  . chlorhexidine  15 mL Mouth Rinse BID  . ciprofloxacin  400 mg Intravenous Q12H  . fentaNYL (SUBLIMAZE) injection  50 mcg Intravenous Once  . pantoprazole (PROTONIX) IV  40 mg Intravenous Q24H  . piperacillin-tazobactam  3.375 g Intravenous Q6H  . sodium chloride  3 mL Intravenous Q12H   . sodium chloride 10 mL/hr at 11/28/14 1400  . sodium chloride 1,000 mL (11/28/14 2123)  . sodium  chloride 1,000 mL (11/28/14 2000)  . fentaNYL infusion INTRAVENOUS 300 mcg/hr (11/29/14 0100)  . norepinephrine (LEVOPHED) Adult infusion 6 mcg/min (11/29/14 0403)  . dialysis replacement fluid (prismasate) 600 mL/hr at 11/29/14 0237  . dialysis replacement fluid (prismasate) 200 mL/hr at 11/28/14 1745  . 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 Pneumonia Bilateral knee arthroplasties with limited mobility Morbid obesity/Body mass index is 37.38 Day 4 Zosyn.  Vancomycin stopped on 11/28/14 DVT: SCD's added, no heparin or Lovenox currently.   Plan:  Main issues are sepsis, Acute renal failure and respiratory failure.  Continue antibiotics, and support.  On CRRT and Vent. Nothing specific back from any cultures so far.       LOS: 2 days    Raeqwon Lux 11/29/2014

## 2014-11-29 NOTE — Progress Notes (Signed)
ANTIBIOTIC CONSULT NOTE - Follow up  Pharmacy Consult for Zosyn, Vancomycin, Cipro Indication: Intra-abdominal infection, Sepsis, Gram neg bacteremia   No Known Allergies  Patient Measurements: Height:  71 inches Weight: 113.4 kg  Vital Signs: Temp: 97.2 F (36.2 C) (04/28 0800) Temp Source: Oral (04/28 0800) BP: 106/43 mmHg (04/28 0830) Pulse Rate: 91 (04/28 0830) Intake/Output from previous day: 04/27 0701 - 04/28 0700 In: 3248.5 [I.V.:2713.5; IV Piggyback:460] Out: 2296 [Urine:105; Emesis/NG output:275; Drains:15] Intake/Output from this shift: Total I/O In: 541.2 [I.V.:291.2; IV Piggyback:250] Out: 165 [Emesis/NG output:10; Other:155]  Labs:  Recent Labs  11/27/14 0320 11/28/14 0310  11/28/14 1423 11/28/14 1630 11/28/14 2130 11/29/14 0420  WBC 23.0* 37.3*  --   --   --   --  32.9*  HGB 14.6 14.7  --   --   --   --  13.0  PLT 365 308  --   --   --   --  238  LABCREA  --   --   --   --  136.45  --   --   CREATININE 0.90  --   < > 4.50*  --  4.07* 3.91*  < > = values in this interval not displayed. Estimated Creatinine Clearance: 25.7 mL/min (by C-G formula based on Cr of 3.91).  Recent Labs  11/29/14 0420  VANCORANDOM 23.0     Microbiology: Recent Results (from the past 720 hour(s))  Blood culture (routine x 2)     Status: None (Preliminary result)   Collection Time: 11/27/14  1:45 AM  Result Value Ref Range Status   Specimen Description BLOOD L FA  Final   Special Requests BOTTLES DRAWN AEROBIC AND ANAEROBIC 5CC  Final   Culture   Final    GRAM NEGATIVE RODS Note: Gram Stain Report Called to,Read Back By and Verified With: ALISON RN ON ICU AT 1651 [id number] BY [facility] Performed at [facility]    Report Status PENDING  Incomplete  Blood culture (routine x 2)     Status: None (Preliminary result)   Collection Time: 11/27/14  1:50 AM  Result Value Ref Range Status   Specimen Description BLOOD LHAND  Final   Special Requests BOTTLES DRAWN  AEROBIC AND ANAEROBIC 5CC  Final   Culture   Final    GRAM NEGATIVE RODS Note: Gram Stain Report Called to,Read Back By and Verified With: Charmayne Sheer @ [id number] ON [id number] BY [place] Performed at [facility]    Report Status PENDING  Incomplete  MRSA PCR Screening     Status: None   Collection Time: 11/27/14  9:25 AM  Result Value Ref Range Status   MRSA by PCR NEGATIVE NEGATIVE Final    Comment:        The GeneXpert MRSA Assay (FDA approved for NASAL specimens only), is one component of a comprehensive MRSA colonization surveillance program. It is not intended to diagnose MRSA infection nor to guide or monitor treatment for MRSA infections.   Culture, respiratory (NON-Expectorated)     Status: None   Collection Time: 11/27/14 10:56 AM  Result Value Ref Range Status   Specimen Description TRACHEAL ASPIRATE  Final   Special Requests Normal  Final   Gram Stain   Final    FEW WBC PRESENT,BOTH PMN AND MONONUCLEAR RARE SQUAMOUS EPITHELIAL CELLS PRESENT RARE GRAM POSITIVE COCCI IN PAIRS Performed at [facility]    Culture   Final    Non-Pathogenic Oropharyngeal-type Flora Isolated. Performed at  [facility]    Report Status 11/29/2014 FINAL  Final  Body fluid culture     Status: None (Preliminary result)   Collection Time: 11/27/14  5:06 PM  Result Value Ref Range Status   Specimen Description GALL BLADDER  Final   Special Requests NONE  Final   Gram Stain   Final    RARE WBC PRESENT,BOTH PMN AND MONONUCLEAR FEW GRAM NEGATIVE RODS RARE GRAM POSITIVE COCCI IN PAIRS Gram Stain Report Called to,Read Back By and Verified With: Gram Stain Report Called to,Read Back By and Verified With: Charmayne Sheer RN 11/28/14 8:35AM BY [place] Performed at [facility]    Culture NO GROWTH Performed at [facility]   Final   Report Status PENDING  Incomplete  Clostridium Difficile by PCR     Status: None   Collection Time: 11/27/14  8:54 PM   Result Value Ref Range Status   C difficile by pcr NEGATIVE NEGATIVE Final   Assessment: 64 yr male with percutaneous drain for acute cholecystitis which dislodged last week.  Patient presents with complaint of abdominal pain.  Febrile, elevated WBC.  CT shows possible biliary leak and ? Acute cholecystitis  Patient to be admitted for Sepsis and Cholecystitis.  Surgery consulted  Significant events: 4/26: Rapid response for diaphoresis, SOB, hypotension. Tx to ICU and intubated. SQ abscess drained and new perc gallbladder drain placed. 4/27: CRRT started. Cipro added for GNR in blood.  Today, 11/29/2014: Vanc level elevated as expected. SCr improved a little on CRRT. Remains afebrile. WBCs elevated. PCT = 56  4/26 blood x2: GNR in both 4/25 trach asp: normal flora 4/26 gall bladder fluid: polymicrobial, incubating  Goal of Therapy:  Vancomycin Trough level: 15-20 mcg/ml Appropriate antibiotic dosing for renal function; eradication of infection  Plan:  Cont Zosyn 2.25g IV q8h. Cont Cipro 400mg  IV q12h. Cont to hold Vancomycin. Recheck a random level in 24hrs to calc clearance. Follow up renal fxn, culture results, and clinical course.  Romeo Rabon, PharmD, pager [phone]. 11/29/2014,9:02 AM.

## 2014-11-29 NOTE — Progress Notes (Signed)
   11/29/14 1500  Clinical Encounter Type  Visited With Family;Patient and family together  Visit Type Follow-up;Spiritual support;Social support  Spiritual Encounters  Spiritual Needs Prayer;Emotional;Grief support

## 2014-11-30 ENCOUNTER — Encounter (HOSPITAL_COMMUNITY): Payer: Self-pay | Admitting: Radiology

## 2014-11-30 ENCOUNTER — Inpatient Hospital Stay (HOSPITAL_COMMUNITY): Payer: Medicare Other

## 2014-11-30 LAB — RENAL FUNCTION PANEL
ANION GAP: 7 (ref 5–15)
Albumin: 2.3 g/dL — ABNORMAL LOW (ref 3.5–5.2)
BUN: 26 mg/dL — AB (ref 6–23)
CALCIUM: 7.5 mg/dL — AB (ref 8.4–10.5)
CHLORIDE: 100 mmol/L (ref 96–112)
CO2: 28 mmol/L (ref 19–32)
CREATININE: 3.04 mg/dL — AB (ref 0.50–1.35)
GFR, EST AFRICAN AMERICAN: 24 mL/min — AB (ref >90)
GFR, EST NON AFRICAN AMERICAN: 20 mL/min — AB (ref >90)
GLUCOSE: 111 mg/dL — AB (ref 70–99)
POTASSIUM: 4.9 mmol/L (ref 3.5–5.1)
Phosphorus: 3.6 mg/dL (ref 2.3–4.6)
SODIUM: 135 mmol/L (ref 135–145)

## 2014-11-30 LAB — BLOOD GAS, ARTERIAL
ACID-BASE DEFICIT: 6.7 mmol/L — AB (ref 0.0–2.0)
ACID-BASE DEFICIT: 5.3 mmol/L — AB (ref 0.0–2.0)
ACID-BASE DEFICIT: 4 mmol/L — AB (ref 0.0–2.0)
Acid-base deficit: 6.1 mmol/L — ABNORMAL HIGH (ref 0.0–2.0)
Acid-base deficit: 4.1 mmol/L — ABNORMAL HIGH (ref 0.0–2.0)
Bicarbonate: 24 mEq/L (ref 20.0–24.0)
Bicarbonate: 22.9 mEq/L (ref 20.0–24.0)
Bicarbonate: 24.6 mEq/L — ABNORMAL HIGH (ref 20.0–24.0)
Bicarbonate: 22.5 mEq/L (ref 20.0–24.0)
Bicarbonate: 24.7 mEq/L — ABNORMAL HIGH (ref 20.0–24.0)
DRAWN BY: 31814
DRAWN BY: 257701
Drawn by: 31814
Drawn by: 257701
Drawn by: 257701
FIO2: 0.4 %
FIO2: 0.4 %
FIO2: 0.4 %
FIO2: 0.4 %
FIO2: 0.4 %
LHR: 12 {breaths}/min
LHR: 22 {breaths}/min
MECHVT: 600 mL
MECHVT: 500 mL
O2 SAT: 92 %
O2 Saturation: 91.3 %
O2 Saturation: 94.9 %
O2 Saturation: 97.1 %
O2 Saturation: 93.7 %
PATIENT TEMPERATURE: 98.6
PATIENT TEMPERATURE: 96
PATIENT TEMPERATURE: 96
PEEP/CPAP: 5 cmH2O
PEEP: 5 cmH2O
PEEP: 5 cmH2O
PEEP: 5 cmH2O
PEEP: 5 cmH2O
PH ART: 7.14 — AB (ref 7.350–7.450)
PH ART: 7.186 — AB (ref 7.350–7.450)
PH ART: 7.173 — AB (ref 7.350–7.450)
PH ART: 7.302 — AB (ref 7.350–7.450)
PH ART: 7.238 — AB (ref 7.350–7.450)
PO2 ART: 72.3 mmHg — AB (ref 80.0–100.0)
PO2 ART: 94 mmHg (ref 80.0–100.0)
PO2 ART: 73.1 mmHg — AB (ref 80.0–100.0)
Patient temperature: 97.9
Patient temperature: 97.9
RATE: 20 resp/min
RATE: 26 resp/min
RATE: 26 resp/min
TCO2: 22.8 mmol/L (ref 0–100)
TCO2: 21.7 mmol/L (ref 0–100)
TCO2: 23.1 mmol/L (ref 0–100)
TCO2: 20.3 mmol/L (ref 0–100)
TCO2: 22.8 mmol/L (ref 0–100)
VT: 600 mL
VT: 600 mL
VT: 600 mL
pCO2 arterial: 73.2 mmHg (ref 35.0–45.0)
pCO2 arterial: 62.5 mmHg (ref 35.0–45.0)
pCO2 arterial: 69.7 mmHg (ref 35.0–45.0)
pCO2 arterial: 46 mmHg — ABNORMAL HIGH (ref 35.0–45.0)
pCO2 arterial: 58.7 mmHg (ref 35.0–45.0)
pO2, Arterial: 84.9 mmHg (ref 80.0–100.0)
pO2, Arterial: 74.4 mmHg — ABNORMAL LOW (ref 80.0–100.0)

## 2014-11-30 LAB — CULTURE, BLOOD (ROUTINE X 2)

## 2014-11-30 LAB — APTT
APTT: 58 s — AB (ref 24–37)
APTT: 69 s — AB (ref 24–37)
APTT: 88 s — AB (ref 24–37)
APTT: 70 s — AB (ref 24–37)
aPTT: 39 seconds — ABNORMAL HIGH (ref 24–37)
aPTT: 50 seconds — ABNORMAL HIGH (ref 24–37)
aPTT: 55 seconds — ABNORMAL HIGH (ref 24–37)
aPTT: 74 seconds — ABNORMAL HIGH (ref 24–37)
aPTT: 76 seconds — ABNORMAL HIGH (ref 24–37)
aPTT: 82 seconds — ABNORMAL HIGH (ref 24–37)
aPTT: 75 seconds — ABNORMAL HIGH (ref 24–37)

## 2014-11-30 LAB — CBC
HCT: 39.4 % (ref 39.0–52.0)
Hemoglobin: 12.4 g/dL — ABNORMAL LOW (ref 13.0–17.0)
MCH: 30.8 pg (ref 26.0–34.0)
MCHC: 31.5 g/dL (ref 30.0–36.0)
MCV: 98 fL (ref 78.0–100.0)
Platelets: 215 10*3/uL (ref 150–400)
RBC: 4.02 MIL/uL — ABNORMAL LOW (ref 4.22–5.81)
RDW: 15.6 % — ABNORMAL HIGH (ref 11.5–15.5)
WBC: 36 10*3/uL — ABNORMAL HIGH (ref 4.0–10.5)

## 2014-11-30 LAB — BASIC METABOLIC PANEL
Anion gap: 9 (ref 5–15)
BUN: 25 mg/dL — ABNORMAL HIGH (ref 6–23)
CO2: 25 mmol/L (ref 19–32)
Calcium: 8 mg/dL — ABNORMAL LOW (ref 8.4–10.5)
Chloride: 103 mmol/L (ref 96–112)
Creatinine, Ser: 3.09 mg/dL — ABNORMAL HIGH (ref 0.50–1.35)
GFR calc Af Amer: 23 mL/min — ABNORMAL LOW (ref >90)
GFR calc non Af Amer: 20 mL/min — ABNORMAL LOW (ref >90)
Glucose, Bld: 151 mg/dL — ABNORMAL HIGH (ref 70–99)
Potassium: 5.5 mmol/L — ABNORMAL HIGH (ref 3.5–5.1)
Sodium: 137 mmol/L (ref 135–145)

## 2014-11-30 LAB — PHOSPHORUS: PHOSPHORUS: 4.8 mg/dL — AB (ref 2.3–4.6)

## 2014-11-30 LAB — BODY FLUID CULTURE

## 2014-11-30 LAB — GLUCOSE, CAPILLARY
GLUCOSE-CAPILLARY: 144 mg/dL — AB (ref 70–99)
GLUCOSE-CAPILLARY: 159 mg/dL — AB (ref 70–99)
Glucose-Capillary: 128 mg/dL — ABNORMAL HIGH (ref 70–99)
Glucose-Capillary: 124 mg/dL — ABNORMAL HIGH (ref 70–99)

## 2014-11-30 LAB — MAGNESIUM: Magnesium: 2.2 mg/dL (ref 1.5–2.5)

## 2014-11-30 LAB — LACTIC ACID, PLASMA: LACTIC ACID, VENOUS: 2.1 mmol/L — AB (ref 0.5–2.0)

## 2014-11-30 LAB — VANCOMYCIN, RANDOM: Vancomycin Rm: 13.1 ug/mL

## 2014-11-30 MED ORDER — MIDAZOLAM HCL 2 MG/2ML IJ SOLN
1.0000 mg | INTRAMUSCULAR | Status: DC | PRN
  Administered 2014-11-30 – 2014-12-07 (×20): 1 mg via INTRAVENOUS
  Filled 2014-11-30 (×19): qty 2

## 2014-11-30 MED ORDER — HEPARIN SODIUM (PORCINE) 1000 UNIT/ML DIALYSIS
1000.0000 [IU] | INTRAMUSCULAR | Status: DC | PRN
  Filled 2014-11-30: qty 6

## 2014-11-30 MED ORDER — MIDAZOLAM HCL 2 MG/2ML IJ SOLN
INTRAMUSCULAR | Status: AC
  Administered 2014-11-30: 2 mg
  Filled 2014-11-30: qty 2

## 2014-11-30 MED ORDER — IPRATROPIUM-ALBUTEROL 0.5-2.5 (3) MG/3ML IN SOLN
3.0000 mL | Freq: Four times a day (QID) | RESPIRATORY_TRACT | Status: DC
  Administered 2014-11-30 – 2014-12-12 (×45): 3 mL via RESPIRATORY_TRACT
  Filled 2014-11-30 (×46): qty 3

## 2014-11-30 MED ORDER — ETOMIDATE 2 MG/ML IV SOLN
INTRAVENOUS | Status: AC
  Filled 2014-11-30: qty 20

## 2014-11-30 MED ORDER — HEPARIN (PORCINE) 2000 UNITS/L FOR CRRT
INTRAVENOUS_CENTRAL | Status: DC | PRN
  Administered 2014-12-07: 02:00:00 via INTRAVENOUS_CENTRAL
  Filled 2014-11-30 (×4): qty 1000

## 2014-11-30 MED ORDER — SUCCINYLCHOLINE CHLORIDE 20 MG/ML IJ SOLN
INTRAMUSCULAR | Status: AC
  Filled 2014-11-30: qty 1

## 2014-11-30 MED ORDER — HEPARIN BOLUS VIA INFUSION (CRRT)
1000.0000 [IU] | INTRAVENOUS | Status: DC | PRN
  Filled 2014-11-30: qty 1000

## 2014-11-30 MED ORDER — IOHEXOL 300 MG/ML  SOLN
25.0000 mL | INTRAMUSCULAR | Status: AC
  Administered 2014-11-30 (×2): 25 mL via ORAL

## 2014-11-30 MED ORDER — VECURONIUM BROMIDE 10 MG IV SOLR
8.0000 mg | Freq: Once | INTRAVENOUS | Status: AC
  Administered 2014-11-30: 8 mg via INTRAVENOUS
  Filled 2014-11-30: qty 10

## 2014-11-30 MED ORDER — PRISMASOL BGK 0/2.5 32-2.5 MEQ/L IV SOLN
INTRAVENOUS | Status: DC
  Administered 2014-11-30 – 2014-12-07 (×12): via INTRAVENOUS_CENTRAL
  Filled 2014-11-30 (×16): qty 5000

## 2014-11-30 MED ORDER — AMIODARONE HCL IN DEXTROSE 360-4.14 MG/200ML-% IV SOLN
30.0000 mg/h | INTRAVENOUS | Status: DC
  Administered 2014-12-01 – 2014-12-02 (×3): 30 mg/h via INTRAVENOUS
  Filled 2014-11-30 (×2): qty 200

## 2014-11-30 MED ORDER — LIDOCAINE HCL (CARDIAC) 20 MG/ML IV SOLN
INTRAVENOUS | Status: AC
  Filled 2014-11-30: qty 5

## 2014-11-30 MED ORDER — VANCOMYCIN HCL 10 G IV SOLR
1250.0000 mg | Freq: Once | INTRAVENOUS | Status: AC
  Administered 2014-11-30: 1250 mg via INTRAVENOUS
  Filled 2014-11-30: qty 1250

## 2014-11-30 MED ORDER — PRISMASOL BGK 4/2.5 32-4-2.5 MEQ/L IV SOLN
INTRAVENOUS | Status: DC
  Administered 2014-11-30 – 2014-12-07 (×40): via INTRAVENOUS_CENTRAL
  Filled 2014-11-30 (×58): qty 5000

## 2014-11-30 MED ORDER — STERILE WATER FOR INJECTION IV SOLN
INTRAVENOUS | Status: DC
  Administered 2014-11-30 – 2014-12-04 (×18): via INTRAVENOUS_CENTRAL
  Filled 2014-11-30 (×41): qty 150

## 2014-11-30 MED ORDER — SODIUM CHLORIDE 0.9 % IJ SOLN
250.0000 [IU]/h | INTRAMUSCULAR | Status: DC
  Administered 2014-11-30: 1700 [IU]/h via INTRAVENOUS_CENTRAL
  Administered 2014-11-30: 1500 [IU]/h via INTRAVENOUS_CENTRAL
  Administered 2014-12-01 (×4): 1550 [IU]/h via INTRAVENOUS_CENTRAL
  Administered 2014-12-02 (×3): 1500 [IU]/h via INTRAVENOUS_CENTRAL
  Administered 2014-12-03: 1700 [IU]/h via INTRAVENOUS_CENTRAL
  Administered 2014-12-03: 1600 [IU]/h via INTRAVENOUS_CENTRAL
  Administered 2014-12-04 (×2): 1500 [IU]/h via INTRAVENOUS_CENTRAL
  Administered 2014-12-04: 1800 [IU]/h via INTRAVENOUS_CENTRAL
  Administered 2014-12-04: 1650 [IU]/h via INTRAVENOUS_CENTRAL
  Administered 2014-12-05: 1450 [IU]/h via INTRAVENOUS_CENTRAL
  Administered 2014-12-05: 1550 [IU]/h via INTRAVENOUS_CENTRAL
  Filled 2014-11-30 (×24): qty 2

## 2014-11-30 MED ORDER — AMIODARONE LOAD VIA INFUSION
150.0000 mg | Freq: Once | INTRAVENOUS | Status: AC
  Administered 2014-11-30: 150 mg via INTRAVENOUS
  Filled 2014-11-30: qty 83.34

## 2014-11-30 MED ORDER — AMIODARONE IV BOLUS ONLY 150 MG/100ML
150.0000 mg | Freq: Once | INTRAVENOUS | Status: AC
  Administered 2014-11-30: 150 mg via INTRAVENOUS

## 2014-11-30 MED ORDER — SODIUM CHLORIDE 0.9 % IV SOLN
1.0000 g | Freq: Two times a day (BID) | INTRAVENOUS | Status: DC
  Administered 2014-11-30 – 2014-12-07 (×16): 1 g via INTRAVENOUS
  Filled 2014-11-30 (×17): qty 1

## 2014-11-30 MED ORDER — AMIODARONE HCL IN DEXTROSE 360-4.14 MG/200ML-% IV SOLN
60.0000 mg/h | INTRAVENOUS | Status: AC
  Administered 2014-11-30: 60 mg/h via INTRAVENOUS
  Filled 2014-11-30 (×3): qty 200

## 2014-11-30 MED ORDER — ROCURONIUM BROMIDE 50 MG/5ML IV SOLN
INTRAVENOUS | Status: AC
  Filled 2014-11-30: qty 2

## 2014-11-30 NOTE — Progress Notes (Signed)
Patient ID: Allen Green, male   DOB: 1950/11/18, 64 y.o.   MRN: [id number]  [facility] Progress Note    Assessment/ Plan:   1. Septic shock secondary to gallbladder abscess/cholecystitis: On pressors for blood pressure support as well as broad-spectrum antibiotic coverage. 2. Acute renal failure: Secondary to septic shock and contrast-induced nephropathy-remains anuric and without any evident renal recovery. Adjustments made to CRRT to allow for better potassium/bicarbonate control. Discontinue normal saline 3. Hyperkalemia: Secondary to acute renal failure, improving with CRRT-switch prefilter fluid to 0K and post filter to isotonic sodium bicarbonate. 4. Polycythemia: Chronic and secondary to underlying COPD/chronic hypoxia. He has an outpatient follow-up with hematology. 5. Chronic obstructive lung disease: Currently on ventilator-further management per CCM.  Subjective:   Overnight events noted-unfortunately worse. More acidotic, higher pressor needs.    Objective:   BP 96/55 mmHg  Pulse 73  Temp(Src) 96.3 F (35.7 C) (Oral)  Resp 26  Ht 5\' 11"  (1.803 m)  Wt 122 kg (268 lb 15.4 oz)  BMI 37.53 kg/m2  SpO2 100%  Intake/Output Summary (Last 24 hours) at 11/30/14 1247 Last data filed at 11/30/14 1100  Gross per 24 hour  Intake 5027.18 ml  Output   4226 ml  Net 801.18 ml   Weight change: 0.5 kg (1 lb 1.6 oz)  Physical Exam: Gen: Comfortable on the ventilator CVS: Pulse regular in rate and rhythm, S1 and S2 normal Resp: Coarse breath sounds bilaterally, no rales Abd: Soft, obese, nontender Ext: Trace to 1+ lower extremity edema  Imaging: US Renal  11/28/2014   CLINICAL DATA:  64 year old male with renal failure  EXAM: RENAL / URINARY TRACT ULTRASOUND COMPLETE  COMPARISON:  Prior CT abdomen/pelvis 11/27/2014  FINDINGS: Right Kidney:  Length: 11.7 cm. Echogenicity within normal limits. No mass or hydronephrosis visualized.  Left Kidney:  Length: 14 cm.  Echogenicity within normal limits. No mass or hydronephrosis visualized.  Bladder:  Appears normal for degree of bladder distention.  Other: Incidental note is made of small volume perihepatic ascites. The liver is diffusely heterogeneous in appearance.  IMPRESSION: 1. No evidence of hydronephrosis or other renal abnormality. 2. Incidental note is made of small volume perihepatic ascites.   Electronically Signed   By: Jacqulynn Cadet M.D.   On: 11/28/2014 21:05   Dg Chest Port 1 View  11/30/2014   CLINICAL DATA:  Shortness of breath.  EXAM: PORTABLE CHEST - 1 VIEW  COMPARISON:  Same day.  FINDINGS: Stable cardiomediastinal silhouette endotracheal nasogastric tubes are unchanged. Right internal jugular catheter line is unchanged. Stable bibasilar opacities are noted concerning for subsegmental atelectasis, pneumonia or edema. No pneumothorax or significant pleural effusion is noted.  IMPRESSION: Stable bibasilar opacities are noted concerning for subsegmental atelectasis, pneumonia or edema. Stable support apparatus.   Electronically Signed   By: Marijo Conception, M.D.   On: 11/30/2014 09:40   Dg Chest Port 1 View  11/30/2014   CLINICAL DATA:  Respiratory failure.  EXAM: PORTABLE CHEST - 1 VIEW  COMPARISON:  11/29/2014 .  FINDINGS: Endotracheal tube, NG tube, bilateral IJ lines in stable position. Mediastinum and hilar structures are stable. Persistent bibasilar atelectasis and/or infiltrates. No change. No prominent pleural effusion. No pneumothorax.  IMPRESSION: 1. Lines and tubes in stable position. 2. Persistent bibasilar subsegmental atelectasis and/or infiltrates.   Electronically Signed   By: Pinckard   On: 11/30/2014 07:04   Dg Chest Port 1 View  11/29/2014   CLINICAL DATA:  Acute respiratory  failure.  Pneumonia.  EXAM: PORTABLE CHEST - 1 VIEW  COMPARISON:  11/28/2014  FINDINGS: Support apparatus: Unchanged, consisting of RIGHT IJ vas cath, endotracheal tube, LEFT IJ central line and enteric  tube. Monitoring leads project over the chest.  Cardiomediastinal Silhouette:  Unchanged.  Lungs: Left-greater-than-right interstitial and basilar airspace opacity. Superimposed atelectasis. Findings are more suggestive a pulmonary edema than pneumonia. No pneumothorax. When compared to yesterday's exam, aeration at the lung bases is slightly worse, with increasing atelectasis.  Effusions:  None.  Other:  None.  IMPRESSION: 1. Stable support apparatus. 2. Increasing basilar opacity likely due to atelectasis.   Electronically Signed   By: Dereck Ligas M.D.   On: 11/29/2014 07:31   Dg Chest Port 1 View  11/28/2014   CLINICAL DATA:  Acute respiratory failure.  EXAM: PORTABLE CHEST - 1 VIEW  COMPARISON:  11/28/2014 at 0458 hours  FINDINGS: Endotracheal tube remains in place and as well above the carina. Right jugular central venous catheter has been placed and terminates over the upper SVC. Left jugular catheter remains in place, terminating over the upper SVC. Enteric tube courses into the left upper abdomen with tip not imaged. Pulmonary vascular congestion bilateral interstitial opacities overall appear slightly worse compared to the prior study. No pleural effusion or pneumothorax is identified. Cardiomediastinal silhouette is unchanged.  IMPRESSION: 1. New right jugular catheter terminates over the upper SVC. 2. Slight worsening of interstitial edema.   Electronically Signed   By: Logan Bores   On: 11/28/2014 16:20    Labs: BMET  Recent Labs Lab 11/27/14 0320 11/28/14 1010 11/28/14 1423 11/28/14 2130 11/29/14 0420 11/29/14 1600 11/30/14 0500  NA 136 134* 136 135 134* 135 137  K 4.3 6.6* 6.0* 6.0* 5.6* 5.8* 5.5*  CL 103 102 103 103 102 103 103  CO2 26 24 23 24 23 25 25   GLUCOSE 122* 97 129* 98 100* 112* 151*  BUN 19 39* 42* 36* 32* 27* 25*  CREATININE 0.90 4.22* 4.50* 4.07* 3.91* 3.35* 3.09*  CALCIUM 8.5 7.7* 8.1* 7.6* 7.5* 7.6* 8.0*  PHOS  --   --   --  5.9*  --  5.3* 4.8*    CBC  Recent Labs Lab 11/27/2014 2154 11/27/14 0320 11/28/14 0310 11/29/14 0420 11/30/14 0500  WBC 22.3* 23.0* 37.3* 32.9* 36.0*  NEUTROABS 17.8* 20.0*  --  30.9*  --   HGB 15.2 14.6 14.7 13.0 12.4*  HCT 44.9 44.5 45.3 41.2 39.4  MCV 94.3 95.3 96.0 97.9 98.0  PLT 398 365 308 238 215   Medications:    . antiseptic oral rinse  7 mL Mouth Rinse QID  . chlorhexidine  15 mL Mouth Rinse BID  . ciprofloxacin  400 mg Intravenous Q12H  . feeding supplement (PRO-STAT SUGAR FREE 64)  30 mL Per Tube TID  . feeding supplement (VITAL HIGH PROTEIN)  1,000 mL Per Tube Q24H  . fentaNYL (SUBLIMAZE) injection  50 mcg Intravenous Once  . heparin subcutaneous  5,000 Units Subcutaneous 3 times per day  . ipratropium-albuterol  3 mL Nebulization Q6H  . meropenem (MERREM) IV  1 g Intravenous Q12H  . pantoprazole (PROTONIX) IV  40 mg Intravenous Q24H  . sodium chloride  3 mL Intravenous Q12H   Elmarie Shiley, MD 11/30/2014, 12:47 PM

## 2014-11-30 NOTE — Progress Notes (Signed)
eLink Physician-Brief Progress Note Patient Name: Allen Green DOB: 02-Nov-1950 MRN: [id number]   Date of Service  11/30/2014  HPI/Events of Note  ABG on 40%/PRVC 20/TV 600/P 5 = 7.18/62/84/22  eICU Interventions  Increase rate to 26 and recheck ABG at 7:30 AM.     Intervention Category Major Interventions: Respiratory failure - evaluation and management  Lysle Dingwall 11/30/2014, 6:19 AM

## 2014-11-30 NOTE — Progress Notes (Signed)
Amiodarone Drug - Drug Interaction Consult Note  Recommendations: Monitor QTc closely while patient on cipro and amiodarone  Amiodarone is metabolized by the cytochrome P450 system and therefore has the potential to cause many drug interactions. Amiodarone has an average plasma half-life of 50 days (range 20 to 100 days).   There is potential for drug interactions to occur several weeks or months after stopping treatment and the onset of drug interactions may be slow after initiating amiodarone.   []  Statins: Increased risk of myopathy. Simvastatin- restrict dose to 20mg  daily. Other statins: counsel patients to report any muscle pain or weakness immediately.  []  Anticoagulants: Amiodarone can increase anticoagulant effect. Consider warfarin dose reduction. Patients should be monitored closely and the dose of anticoagulant altered accordingly, remembering that amiodarone levels take several weeks to stabilize.  []  Antiepileptics: Amiodarone can increase plasma concentration of phenytoin, the dose should be reduced. Note that small changes in phenytoin dose can result in large changes in levels. Monitor patient and counsel on signs of toxicity.  []  Beta blockers: increased risk of bradycardia, AV block and myocardial depression. Sotalol - avoid concomitant use.  []   Calcium channel blockers (diltiazem and verapamil): increased risk of bradycardia, AV block and myocardial depression.  []   Cyclosporine: Amiodarone increases levels of cyclosporine. Reduced dose of cyclosporine is recommended.  []  Digoxin dose should be halved when amiodarone is started.  []  Diuretics: increased risk of cardiotoxicity if hypokalemia occurs.  []  Oral hypoglycemic agents (glyburide, glipizide, glimepiride): increased risk of hypoglycemia. Patient's glucose levels should be monitored closely when initiating amiodarone therapy.   [x]  Drugs that prolong the QT interval:  Torsades de pointes risk may be increased  with concurrent use - avoid if possible.  Monitor QTc, also keep magnesium/potassium WNL if concurrent therapy can't be avoided. [name] Antibiotics: e.g. Fluoroquinolones, erythromycin. . Antiarrhythmics: e.g. quinidine, procainamide, disopyramide, sotalol. . Antipsychotics: e.g. phenothiazines, haloperidol.  . Lithium, tricyclic antidepressants, and methadone.  Thank You,  Lynelle Doctor  11/30/2014 7:01 PM

## 2014-11-30 NOTE — Progress Notes (Signed)
I visited with the [place]. They are really upset because of the news they have received. Sometimes cure is no longer possible and in such a case, I pray that caring becomes a mater of comfort---physical, emotional ans spiritual.   11/30/14 1200  Clinical Encounter Type  Visited With Family  Visit Type Follow-up;Spiritual support;Social support  Spiritual Encounters  Spiritual Needs Prayer;Emotional;Grief support

## 2014-11-30 NOTE — Progress Notes (Signed)
eLink Physician-Brief Progress Note Patient Name: DANDRAE KUSTRA DOB: [birth year] MRN: [id number]   Date of Service  11/30/2014  HPI/Events of Note  AFIB with RVR (V rate 120 - 140. Currently on an Amiodarone IV infusion. Hypotension - BP = 76/52. Currently on a Norepinephrine IV infusion.   eICU Interventions  Will order: 1. Re-bolus with Amiodarone IV. 2. Monitor CVP Q 4 hours.      Intervention Category Major Interventions: Arrhythmia - evaluation and management;Hypotension - evaluation and management  Lysle Dingwall 11/30/2014, 10:53 PM

## 2014-11-30 NOTE — Progress Notes (Signed)
eLink Physician-Brief Progress Note Patient Name: Allen Green DOB: 03/01/1951 MRN: [id number]   Date of Service  11/30/2014  HPI/Events of Note  ABG on 40%/PRVC 12/TV 600/P5 = 7.14/73/72/24  eICU Interventions  Will increase rate to 20 and recheck ABG at 6 PM.      Intervention Category Major Interventions: Respiratory failure - evaluation and management  Lysle Dingwall 11/30/2014, 4:54 AM

## 2014-11-30 NOTE — Progress Notes (Signed)
eLink Physician-Brief Progress Note Patient Name: JONATHAN CORPUS DOB: [birth year] MRN: [id number]   Date of Service  11/30/2014  HPI/Events of Note  Bedside nurse requests bilateral wrist restraints.   eICU Interventions  Will order restraints.      Intervention Category Minor Interventions: Routine modifications to care plan (e.g. PRN medications for pain, fever)  Sommer,Steven Eugene 11/30/2014, 4:12 AM

## 2014-11-30 NOTE — Progress Notes (Signed)
PULMONARY / CRITICAL CARE MEDICINE   Name: Allen Green MRN: [id number] DOB: May 19, 1951    ADMISSION DATE:  11/22/2014 CONSULTATION DATE: 4/26  REFERRING MD :  [facility]  CHIEF COMPLAINT:  SOB  INITIAL PRESENTATION: 64 y/o male admitted on 4/26 with septic shock and acute respiratory failure due to cholecystitis and an abscess associated with a recently dislodged gallbladder drain.  STUDIES:  4/26 CT Abdomen> acute cholecystitis and gallbladder tract abscess 4/26 ECHO> poor study, can't comment on LV or RV size/function SIGNIFICANT EVENTS: 4/26 intubated, Gallbladder drain placed by IR   SUBJECTIVE:  sedated  VITAL SIGNS: Temp:  [96.4 F (35.8 C)-98 F (36.7 C)] 96.4 F (35.8 C) (04/29 0830) Pulse Rate:  [72-99] 72 (04/29 0830) Resp:  [11-26] 26 (04/29 0830) BP: (72-130)/(17-91) 91/38 mmHg (04/29 0830) SpO2:  [88 %-100 %] 100 % (04/29 0830) FiO2 (%):  [40 %] 40 % (04/29 0800) Weight:  [268 lb 15.4 oz (122 kg)] 268 lb 15.4 oz (122 kg) (04/29 0400) HEMODYNAMICS: CVP:  [11 mmHg] 11 mmHg VENTILATOR SETTINGS: Vent Mode:  [-] PRVC FiO2 (%):  [40 %] 40 % Set Rate:  [12 bmp-26 bmp] 26 bmp Vt Set:  [600 mL] 600 mL PEEP:  [5 cmH20] 5 cmH20 Plateau Pressure:  [16 cmH20-37 cmH20] 37 cmH20 INTAKE / OUTPUT:  Intake/Output Summary (Last 24 hours) at 11/30/14 0842 Last data filed at 11/30/14 0800  Gross per 24 hour  Intake 5113.18 ml  Output   3945 ml  Net 1168.18 ml    PHYSICAL EXAMINATION: General:  On vent, sedated HENT: NCAT, ETT in place, no neck PULM: CTA B, autopeep 20 with rr 26 CV: Tachy, regular, no mgr GI: BS+, soft, drain in place, large Derm: improved redness, swelling around GB drain, no pus expressed Neuro: sedated or agitated  LABS:  CBC  Recent Labs Lab 11/28/14 0310 11/29/14 0420 11/30/14 0500  WBC 37.3* 32.9* 36.0*  HGB 14.7 13.0 12.4*  HCT 45.3 41.2 39.4  PLT 308 238 215   Coag's  Recent Labs Lab 11/27/14 0525 11/28/14 0310  APTT 30 38*   INR 1.08 1.36   BMET  Recent Labs Lab 11/29/14 0420 11/29/14 1600 11/30/14 0500  NA 134* 135 137  K 5.6* 5.8* 5.5*  CL 102 103 103  CO2 23 25 25   BUN 32* 27* 25*  CREATININE 3.91* 3.35* 3.09*  GLUCOSE 100* 112* 151*   Electrolytes  Recent Labs Lab 11/28/14 2130 11/29/14 0420 11/29/14 1600 11/30/14 0500  CALCIUM 7.6* 7.5* 7.6* 8.0*  MG  --  1.8  --  2.2  PHOS 5.9*  --  5.3* 4.8*   Sepsis Markers  Recent Labs Lab 11/07/2014 2202 11/27/14 0320 11/28/14 0310 11/29/14 0420  LATICACIDVEN 1.48 1.1 1.9  --   PROCALCITON  --  0.14  --  56.94   ABG  Recent Labs Lab 11/30/14 0436 11/30/14 0603 11/30/14 0730  PHART 7.140* 7.186* 7.173*  PCO2ART 73.2* 62.5* 69.7*  PO2ART 72.3* 84.9 74.4*   Liver Enzymes  Recent Labs Lab 11/23/2014 2154 11/27/14 0320 11/28/14 2130 11/29/14 1600  AST 18 18  --   --   ALT 21 20  --   --   ALKPHOS 72 70  --   --   BILITOT 0.4 0.8  --   --   ALBUMIN 3.7 3.3* 2.6* 2.5*   Cardiac Enzymes  Recent Labs Lab 11/27/14 1250 11/27/14 2048 11/28/14 1010  TROPONINI <0.03 <0.03 <0.03   Glucose  Recent  Labs Lab 11/29/14 0743 11/29/14 1155 11/29/14 1556 11/29/14 2000 11/29/14 2316 11/30/14 0404  GLUCAP 97 132* 110* 109* 144* 128*    Imaging    ASSESSMENT / PLAN:  PULMONARY OETT 4/26(PCCM)>> A: Acute respiratory failure with hypercapnea> improved 4/27, passing SBT Severe COPD, but doing surprisingly well on 4/27 AM, so an elective Ex-lap as outpatient for cholecystectomy not unreasonable Tobacco abuse Mixed acidosis   P:   Hold extubation as overall clinical picture seems a worse (sepsis, AKI, etc) Wean in Pressure support mode as able Vent bundle BD's Wean FIO2 May need trach if he survives current insult  RR increased to 26 With [place] still<7.2 on 4/29. He is autopeeping to peep of 20 with rate 26. Will ask renal to increase bicarb, we may need to turn rate down due to his severe copd. We could use NMB to defeat  autopeep/air trapping  CARDIOVASCULAR CVL 4/26 L IJ CVL>> A:  Septic shock > worse 4/29, echo not helpful,? Cardiogenic component, more septic at this point P:  Repeat troponin>>neg  procalcitonin>>58 Levophed for MAP > 65 as needed  RENAL Lab Results  Component Value Date   CREATININE 3.09* 11/30/2014   CREATININE 3.35* 11/29/2014   CREATININE 3.91* 11/29/2014    Recent Labs Lab 11/29/14 0420 11/29/14 1600 11/30/14 0500  K 5.6* 5.8* 5.5*      A: Oliguric AKI P:   Monitor BMET and UOP Renal consult 4/27 CVVH started  GASTROINTESTINAL A:   Acute Cholecystis > with peri-gallbladder abscess, s/p drain 4/26 per IR Recent IR drain 4/3->4/12  P:   Abx as noted F/U surgery recs Continue Antibiotic support Monitor drain output 4/29 repeat ct abd to evaluate possible infection  HEMATOLOGIC A:   Chronic polycythemia  P:  Follow H/H Monitor for bleeding  INFECTIOUS A:   Septic shock> worse 4/29 likely all related to draining abscess on 4/26, don't suspect secondary source or inappropriate antibiotic coverage at this point, cipro added 4/27 4/29 recheck ct abd P:   BCx2  4/26>>GNR 2/2 UC  4/26>> Sputum 4/26>>nl flora Wound culture 4/26 > GPC, GNR>> 4/26 c dif>>neg Abx:  4/26 vanc>> 4/26 zoysn>> 4/27 cipro>>  ENDOCRINE CBG (last 3)   Recent Labs  11/29/14 2000 11/29/14 2316 11/30/14 0404  GLUCAP 109* 144* 128*     A:   No acute issue P:   SSI if needed  NEUROLOGIC A:   Intact prior to intubation P:   RASS goal: -1 Continue fentanyl per PAD protocol Agitation an issue 4/28 add prn versed   FAMILY  - Updates: updated at length 4/26 and 4/27 AM bedside  - Inter-disciplinary family meet or Palliative Care meeting due by:  day [place] PCCM Pager [phone] till 3 pm If no answer page [phone] 11/30/2014, 8:42 AM

## 2014-11-30 NOTE — Progress Notes (Signed)
ANTIBIOTIC CONSULT NOTE - Follow up  Pharmacy Consult for Vancomycin, Cipro, Meropenem (Zosyn d/c) Indication: Intra-abdominal infection, Sepsis, Gram neg bacteremia   No Known Allergies  Patient Measurements: Height:  71 inches Weight: 113.4 kg  Vital Signs: Temp: 96.6 F (35.9 C) (04/29 0815) Temp Source: Core (Comment) (04/29 0800) BP: 92/50 mmHg (04/29 0815) Pulse Rate: 74 (04/29 0815) Intake/Output from previous day: 04/28 0701 - 04/29 0700 In: 4741.2 [I.V.:3501.2; NG/GT:590; IV Piggyback:650] Out: 4110 [Urine:8; Emesis/NG output:20; Drains:10] Intake/Output from this shift: Total I/O In: 310 [I.V.:110; IV Piggyback:200] Out: -   Labs:  Recent Labs  11/28/14 0310  11/28/14 1630  11/29/14 0420 11/29/14 1600 11/30/14 0500  WBC 37.3*  --   --   --  32.9*  --  36.0*  HGB 14.7  --   --   --  13.0  --  12.4*  PLT 308  --   --   --  238  --  215  LABCREA  --   --  136.45  --   --   --   --   CREATININE  --   < >  --   < > 3.91* 3.35* 3.09*  < > = values in this interval not displayed. Estimated Creatinine Clearance: 32.5 mL/min (by C-G formula based on Cr of 3.09).  Recent Labs  11/29/14 0420 11/30/14 0500  VANCORANDOM 23.0 13.1    Microbiology: Recent Results (from the past 720 hour(s))  Blood culture (routine x 2)     Status: None (Preliminary result)   Collection Time: 11/27/14  1:45 AM  Result Value Ref Range Status   Specimen Description BLOOD L FA  Final   Special Requests BOTTLES DRAWN AEROBIC AND ANAEROBIC 5CC  Final   Culture   Final    GRAM NEGATIVE RODS Note: Gram Stain Report Called to,Read Back By and Verified With: ALISON RN ON ICU AT 1651 [id number] BY CASTC Performed at [facility]    Report Status PENDING  Incomplete  Blood culture (routine x 2)     Status: None (Preliminary result)   Collection Time: 11/27/14  1:50 AM  Result Value Ref Range Status   Specimen Description BLOOD LHAND  Final   Special Requests BOTTLES DRAWN  AEROBIC AND ANAEROBIC 5CC  Final   Culture   Final    GRAM NEGATIVE RODS Note: Gram Stain Report Called to,Read Back By and Verified With: Charmayne Sheer @ 2440 ON [id number] BY [place] Performed at [facility]    Report Status PENDING  Incomplete  MRSA PCR Screening     Status: None   Collection Time: 11/27/14  9:25 AM  Result Value Ref Range Status   MRSA by PCR NEGATIVE NEGATIVE Final    Comment:        The GeneXpert MRSA Assay (FDA approved for NASAL specimens only), is one component of a comprehensive MRSA colonization surveillance program. It is not intended to diagnose MRSA infection nor to guide or monitor treatment for MRSA infections.   Culture, respiratory (NON-Expectorated)     Status: None   Collection Time: 11/27/14 10:56 AM  Result Value Ref Range Status   Specimen Description TRACHEAL ASPIRATE  Final   Special Requests Normal  Final   Gram Stain   Final    FEW WBC PRESENT,BOTH PMN AND MONONUCLEAR RARE SQUAMOUS EPITHELIAL CELLS PRESENT RARE GRAM POSITIVE COCCI IN PAIRS Performed at [facility]    Culture   Final    Non-Pathogenic  Oropharyngeal-type Flora Isolated. Performed at [facility]    Report Status 11/29/2014 FINAL  Final  Body fluid culture     Status: None (Preliminary result)   Collection Time: 11/27/14  5:06 PM  Result Value Ref Range Status   Specimen Description GALL BLADDER  Final   Special Requests NONE  Final   Gram Stain   Final    RARE WBC PRESENT,BOTH PMN AND MONONUCLEAR FEW GRAM NEGATIVE RODS RARE GRAM POSITIVE COCCI IN PAIRS Gram Stain Report Called to,Read Back By and Verified With: Gram Stain Report Called to,Read Back By and Verified With: Charmayne Sheer RN 11/28/14 8:35AM BY [place] Performed at [facility]   Final    Culture reincubated for better growth Performed at [facility]    Report Status PENDING  Incomplete  Clostridium Difficile by PCR     Status: None    Collection Time: 11/27/14  8:54 PM  Result Value Ref Range Status   C difficile by pcr NEGATIVE NEGATIVE Final   Assessment: 64 yr male with percutaneous drain for acute cholecystitis which dislodged last week.  Patient presents with complaint of abdominal pain.  Febrile, elevated WBC.  CT shows possible biliary leak and ? Acute cholecystitis  Patient to be admitted for Sepsis and Cholecystitis.  Surgery consulted  Significant events: 4/26: Rapid response for diaphoresis, SOB, hypotension. Tx to ICU and intubated. SQ abscess drained and new perc gallbladder drain placed. 4/27: CRRT started. Cipro added for GNR in blood. 4/28: Vancomycin x1 dose (1250mg ), Zosyn d/c, begin Meropenem  Today, 11/30/2014: Vanc level 13.1 this am. SCr improving slowly on CRRT. Somewhat hypothermic WBCs elevated (36K) PCT = 57 (4/28)  4/26 blood x2: 2/2 GNR  4/26 trach asp: Normal flora-final 4/26 gall bladder fluid: multiple sp, final (No staph, group A strep) 4/26 CDiff: negative  Goal of Therapy:  Vancomycin Trough level: 15-20 mcg/ml Appropriate antibiotic dosing for renal function; eradication of infection Dosing Vancomycin per levels  Plan:   Discontinue Zosyn  Begin Meropenem 1gm q12 Cont Cipro 400mg  IV q12h. Vancomycin 1250mg  x1 this am with random level in am 4/30 Follow up renal fxn, culture results, and clinical course.  Minda Ditto PharmD Pager [phone] 11/30/2014, 11:29 AM

## 2014-11-30 NOTE — Care Management Note (Signed)
  Page 2 of 2   12/06/2014     11:15:16 AM CARE MANAGEMENT NOTE 12/06/2014  Patient:  Allen Green, Allen Green   Account Number:  [id number]  Date Initiated:  11/30/2014  Documentation initiated by:  DAVIS,RHONDA  Subjective/Objective Assessment:   sedated on vent with sepsis     Action/Plan:   tbd   Anticipated DC Date:  12/09/2014   Anticipated DC Plan:    In-house referral  Clinical Social Worker      DC Planning Services  CM consult      [facility] Choice  NA   Choice offered to / List presented to:  NA      DME agency  NA     [place] arranged  NA      [place] agency  NA   Status of service:  In process, will continue to follow Medicare Important Message given?   (If response is "NO", the following Medicare IM given date fields will be blank) Date Medicare IM given:   Medicare IM given by:   Date Additional Medicare IM given:   Additional Medicare IM given by:    Discharge Disposition:    Per UR Regulation:  Reviewed for med. necessity/level of care/duration of stay  If discussed at [place] of Stay Meetings, dates discussed:    Comments:  Date:  Dec 06, 2014 U.R. performed for needs and level of care. Will continue to follow for Case Management needs.  Suanne Marker Davis,RN,BSN,CCM  [phone]   Dec 03, 2014/Rhonda L. Rosana Hoes, RN, BSN, CCM. Case Management [place] [phone] No discharge needs present of time of review.   November 29, 2014/Rhonda L. Rosana Hoes, RN, BSN, CCM. Case Management [place] [phone] No discharge needs present of time of review.

## 2014-11-30 NOTE — Progress Notes (Signed)
RT assisted with transporting PT from [facility] ICU 1226 to [facility] CT while on vent at 100% fi02- uneventful. PT is now back in [facility] ICU 1226 on EPIC documented vent settings- RN at bedside.

## 2014-11-30 NOTE — Progress Notes (Signed)
During CT scan patient had tan, green copious secretions from mouth. Patient suctioned and approx 225 cc of secretions removed. Patient continued to produce these secretions throughout CT scan. Vitals and Oxygen remained stable through procedure.  As patient returned to room, Patient continued to have copious amounts of secretions through mouth. Patient tube feeds turned off and placed on low wall intermittent suction via OG tube. Cannister filled with greenish, brownish tan secretions. [name] Minor and Will Huetter notified. Tube feedings remain off. Oxygen saturations are stable. Will continue to monitor.

## 2014-11-30 NOTE — Progress Notes (Signed)
Subjective: He remains gravely ill   Objective: Vital signs in last 24 hours: Temp:  [97.2 F (36.2 C)-98 F (36.7 C)] 97.9 F (36.6 C) (04/29 0400) Pulse Rate:  [80-99] 80 (04/29 0645) Resp:  [11-26] 26 (04/29 0645) BP: (72-130)/(17-91) 97/54 mmHg (04/29 0645) SpO2:  [88 %-99 %] 97 % (04/29 0645) FiO2 (%):  [40 %] 40 % (04/29 0600) Weight:  [122 kg (268 lb 15.4 oz)] 122 kg (268 lb 15.4 oz) (04/29 0400) Last BM Date: 11/28/14 CRRT 4072 Drains 10  NG 20 ml recorded Afebrile, Hypothermia resolved, pt is up on the Levophed.   PH 7.17 Creatinine is better on CRRT WBC 36K    Intake/Output from previous day: 04/28 0701 - 04/29 0700 In: 4631.2 [I.V.:3391.2; NG/GT:590; IV Piggyback:650] Out: 4110 [Urine:8; Emesis/NG output:20; Drains:10] Intake/Output this shift:    General appearance: sedated on Vent but still agitated.   GI: cholecystostomy tube is green with very little output.  no bowel sounds, no response on palpation right now.  Lab Results:   Recent Labs  11/29/14 0420 11/30/14 0500  WBC 32.9* 36.0*  HGB 13.0 12.4*  HCT 41.2 39.4  PLT 238 215    BMET  Recent Labs  11/29/14 1600 11/30/14 0500  NA 135 137  K 5.8* 5.5*  CL 103 103  CO2 25 25  GLUCOSE 112* 151*  BUN 27* 25*  CREATININE 3.35* 3.09*  CALCIUM 7.6* 8.0*   PT/INR  Recent Labs  11/28/14 0310  LABPROT 16.9*  INR 1.36     Recent Labs Lab 11/20/2014 2154 11/27/14 0320 11/28/14 2130 11/29/14 1600  AST 18 18  --   --   ALT 21 20  --   --   ALKPHOS 72 70  --   --   BILITOT 0.4 0.8  --   --   PROT 7.4 6.8  --   --   ALBUMIN 3.7 3.3* 2.6* 2.5*     Lipase     Component Value Date/Time   LIPASE 20 11/19/2014 0412     Studies/Results: US Renal  11/28/2014   CLINICAL DATA:  64 year old male with renal failure  EXAM: RENAL / URINARY TRACT ULTRASOUND COMPLETE  COMPARISON:  Prior CT abdomen/pelvis 11/27/2014  FINDINGS: Right Kidney:  Length: 11.7 cm. Echogenicity within normal  limits. No mass or hydronephrosis visualized.  Left Kidney:  Length: 14 cm. Echogenicity within normal limits. No mass or hydronephrosis visualized.  Bladder:  Appears normal for degree of bladder distention.  Other: Incidental note is made of small volume perihepatic ascites. The liver is diffusely heterogeneous in appearance.  IMPRESSION: 1. No evidence of hydronephrosis or other renal abnormality. 2. Incidental note is made of small volume perihepatic ascites.   Electronically Signed   By: Jacqulynn Cadet M.D.   On: 11/28/2014 21:05   Dg Chest Port 1 View  11/30/2014   CLINICAL DATA:  Respiratory failure.  EXAM: PORTABLE CHEST - 1 VIEW  COMPARISON:  11/29/2014 .  FINDINGS: Endotracheal tube, NG tube, bilateral IJ lines in stable position. Mediastinum and hilar structures are stable. Persistent bibasilar atelectasis and/or infiltrates. No change. No prominent pleural effusion. No pneumothorax.  IMPRESSION: 1. Lines and tubes in stable position. 2. Persistent bibasilar subsegmental atelectasis and/or infiltrates.   Electronically Signed   By: Marcello Moores  Register   On: 11/30/2014 07:04   Dg Chest Port 1 View  11/29/2014   CLINICAL DATA:  Acute respiratory failure.  Pneumonia.  EXAM: PORTABLE CHEST - 1 VIEW  COMPARISON:  11/28/2014  FINDINGS: Support apparatus: Unchanged, consisting of RIGHT IJ vas cath, endotracheal tube, LEFT IJ central line and enteric tube. Monitoring leads project over the chest.  Cardiomediastinal Silhouette:  Unchanged.  Lungs: Left-greater-than-right interstitial and basilar airspace opacity. Superimposed atelectasis. Findings are more suggestive a pulmonary edema than pneumonia. No pneumothorax. When compared to yesterday's exam, aeration at the lung bases is slightly worse, with increasing atelectasis.  Effusions:  None.  Other:  None.  IMPRESSION: 1. Stable support apparatus. 2. Increasing basilar opacity likely due to atelectasis.   Electronically Signed   By: Dereck Ligas M.D.    On: 11/29/2014 07:31   Dg Chest Port 1 View  11/28/2014   CLINICAL DATA:  Acute respiratory failure.  EXAM: PORTABLE CHEST - 1 VIEW  COMPARISON:  11/28/2014 at 0458 hours  FINDINGS: Endotracheal tube remains in place and as well above the carina. Right jugular central venous catheter has been placed and terminates over the upper SVC. Left jugular catheter remains in place, terminating over the upper SVC. Enteric tube courses into the left upper abdomen with tip not imaged. Pulmonary vascular congestion bilateral interstitial opacities overall appear slightly worse compared to the prior study. No pleural effusion or pneumothorax is identified. Cardiomediastinal silhouette is unchanged.  IMPRESSION: 1. New right jugular catheter terminates over the upper SVC. 2. Slight worsening of interstitial edema.   Electronically Signed   By: Logan Bores   On: 11/28/2014 16:20    Medications: . antiseptic oral rinse  7 mL Mouth Rinse QID  . chlorhexidine  15 mL Mouth Rinse BID  . ciprofloxacin  400 mg Intravenous Q12H  . feeding supplement (PRO-STAT SUGAR FREE 64)  30 mL Per Tube TID  . feeding supplement (VITAL HIGH PROTEIN)  1,000 mL Per Tube Q24H  . fentaNYL (SUBLIMAZE) injection  50 mcg Intravenous Once  . heparin subcutaneous  5,000 Units Subcutaneous 3 times per day  . pantoprazole (PROTONIX) IV  40 mg Intravenous Q24H  . piperacillin-tazobactam  3.375 g Intravenous Q6H  . sodium chloride  3 mL Intravenous Q12H   . sodium chloride 10 mL/hr at 11/28/14 1400  . sodium chloride 1,000 mL (11/28/14 2123)  . sodium chloride 100 mL/hr at 11/30/14 0423  . fentaNYL infusion INTRAVENOUS 400 mcg/hr (11/30/14 0400)  . norepinephrine (LEVOPHED) Adult infusion 20 mcg/min (11/30/14 0300)  . dialysis replacement fluid (prismasate) 600 mL/hr at 11/30/14 0423  . dialysis replacement fluid (prismasate) 200 mL/hr at 11/28/14 1745  . 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 Pneumonia Bilateral knee arthroplasties with limited mobility Morbid obesity/Body mass index is 37.38 Day 5 Zosyn/day 3 Cipro/Vancomycin stopped on 11/28/14 DVT: SCD's added, no heparin or Lovenox currently  Plan:  If he were more stable we would repeat the CT scan just to be sure we had everything drained as well as possible.  I don't think he could tolerate surgery as he is now.  Continue antibiotics and  if he becomes more stable repeat CT this weekend if severe sepsis persist.      LOS: 3 days    Emmalynne Courtney 11/30/2014

## 2014-11-30 NOTE — Progress Notes (Signed)
NUTRITION FOLLOW UP  Intervention:   Continue Vital HP @ 50 ml/hr via OGT.   30 ml Prostat TID.   Tube feeding regimen provides 1500 kcal (13 kcal/kg actual body weight), 150 grams of protein, and 912 ml of H2O.   Nutrition Dx:   Enteral nutrition to provide 65-70% of estimated calorie needs based on ASPEN guidelines for hypocaloric, high protein feeding in critically ill obese individuals  Goal:   Pt to meet >/= 90% of their estimated nutrition needs; met with TF  Monitor:   Weight trend, vent status/settings, labs, TF initiation  Assessment:   64 year old Caucasian man with past medical history significant for COPD, GERD and a recently discharged gallbladder drain with development of an abscess and septic shock/acute respiratory failure who presented to the hospital yesterday. Had a CT scan of his abdomen/pelvis with intravenous contrast yesterday to study his area of dislodged gallbladder drain better prior to undergoing decompression of subcutaneous abscess and new drain placement in gallbladder.   - pt with worsening septic shock - drain replacement 4/26 - No signs of fat or muscle depletion - pt on CRRT  - per rn, pt tolerating TF with minimal residuals - labs and medications reviewed  Patient is currently intubated on ventilator support MV: 11.2 L/min Temp (24hrs), Avg:96.8 F (36 C), Min:96.3 F (35.7 C), Max:98 F (36.7 C)  Propofol: none  Height: Ht Readings from Last 1 Encounters:  11/27/14 5\' 11"  (1.803 m)    Weight Status:   Wt Readings from Last 1 Encounters:  11/30/14 268 lb 15.4 oz (122 kg)    Re-estimated needs:  Kcal: 2196  2197 = 11-14 kcal/kg actual body weight) Protein: >151 g Fluid: per MD  Skin: intact  Diet Order: Diet NPO time specified   Intake/Output Summary (Last 24 hours) at 11/30/14 1234 Last data filed at 11/30/14 1100  Gross per 24 hour  Intake 5027.18 ml  Output   4226 ml  Net 801.18 ml    Last BM:  4/27   Labs:   Recent Labs Lab 11/28/14 2130 11/29/14 0420 11/29/14 1600 11/30/14 0500  NA 135 134* 135 137  K 6.0* 5.6* 5.8* 5.5*  CL 103 102 103 103  CO2 24 23 25 25   BUN 36* 32* 27* 25*  CREATININE 4.07* 3.91* 3.35* 3.09*  CALCIUM 7.6* 7.5* 7.6* 8.0*  MG  --  1.8  --  2.2  PHOS 5.9*  --  5.3* 4.8*  GLUCOSE 98 100* 112* 151*    CBG (last 3)   Recent Labs  11/29/14 2316 11/30/14 0404 11/30/14 0744  GLUCAP 144* 128* 159*    Scheduled Meds: . antiseptic oral rinse  7 mL Mouth Rinse QID  . chlorhexidine  15 mL Mouth Rinse BID  . ciprofloxacin  400 mg Intravenous Q12H  . feeding supplement (PRO-STAT SUGAR FREE 64)  30 mL Per Tube TID  . feeding supplement (VITAL HIGH PROTEIN)  1,000 mL Per Tube Q24H  . fentaNYL (SUBLIMAZE) injection  50 mcg Intravenous Once  . heparin subcutaneous  5,000 Units Subcutaneous 3 times per day  . ipratropium-albuterol  3 mL Nebulization Q6H  . meropenem (MERREM) IV  1 g Intravenous Q12H  . pantoprazole (PROTONIX) IV  40 mg Intravenous Q24H  . sodium chloride  3 mL Intravenous Q12H    Continuous Infusions: . sodium chloride Stopped (11/28/14 2100)  . sodium chloride 1,000 mL (11/28/14 2123)  . sodium chloride 100 mL/hr at 11/30/14 1100  . fentaNYL infusion  INTRAVENOUS 400 mcg/hr (11/30/14 0935)  . heparin 10,000 units/ 20 mL infusion syringe 1,500 Units/hr (11/30/14 1057)  . norepinephrine (LEVOPHED) Adult infusion 20 mcg/min (11/30/14 0300)  . dialysis replacement fluid (prismasate) 400 mL/hr at 11/30/14 1042  . dialysate (PRISMASATE) 1,500 mL/hr at 11/30/14 0945  . sodium bicarbonate (isotonic) 1000 mL infusion 200 mL/hr at 11/30/14 [address] [place], [place], [place]

## 2014-11-30 NOTE — Progress Notes (Signed)
eLink Physician-Brief Progress Note Patient Name: Allen Green DOB: 06-09-1951 MRN: [id number]   Date of Service  11/30/2014  HPI/Events of Note  Developed a fib with RVR.  Remains on pressors.  eICU Interventions  Will start amiodarone.     Intervention Category Major Interventions: Other:  Alixander Rallis 11/30/2014, 6:59 PM

## 2014-11-30 NOTE — Progress Notes (Signed)
DVT: heparin/SCD

## 2014-12-01 LAB — CBC
HCT: 34.3 % — ABNORMAL LOW (ref 39.0–52.0)
Hemoglobin: 10.9 g/dL — ABNORMAL LOW (ref 13.0–17.0)
MCH: 30.2 pg (ref 26.0–34.0)
MCHC: 31.8 g/dL (ref 30.0–36.0)
MCV: 95 fL (ref 78.0–100.0)
Platelets: 164 10*3/uL (ref 150–400)
RBC: 3.61 MIL/uL — ABNORMAL LOW (ref 4.22–5.81)
RDW: 15.5 % (ref 11.5–15.5)
WBC: 23.7 10*3/uL — ABNORMAL HIGH (ref 4.0–10.5)

## 2014-12-01 LAB — GLUCOSE, CAPILLARY
GLUCOSE-CAPILLARY: 99 mg/dL (ref 70–99)
Glucose-Capillary: 121 mg/dL — ABNORMAL HIGH (ref 70–99)
Glucose-Capillary: 126 mg/dL — ABNORMAL HIGH (ref 70–99)
Glucose-Capillary: 129 mg/dL — ABNORMAL HIGH (ref 70–99)
Glucose-Capillary: 132 mg/dL — ABNORMAL HIGH (ref 70–99)
Glucose-Capillary: 144 mg/dL — ABNORMAL HIGH (ref 70–99)
Glucose-Capillary: 146 mg/dL — ABNORMAL HIGH (ref 70–99)
Glucose-Capillary: 151 mg/dL — ABNORMAL HIGH (ref 70–99)

## 2014-12-01 LAB — APTT
APTT: 74 s — AB (ref 24–37)
APTT: 71 s — AB (ref 24–37)
aPTT: 78 seconds — ABNORMAL HIGH (ref 24–37)
aPTT: 75 seconds — ABNORMAL HIGH (ref 24–37)
aPTT: 70 seconds — ABNORMAL HIGH (ref 24–37)
aPTT: 88 seconds — ABNORMAL HIGH (ref 24–37)

## 2014-12-01 LAB — RENAL FUNCTION PANEL
ALBUMIN: 2.3 g/dL — AB (ref 3.5–5.2)
ANION GAP: 7 (ref 5–15)
BUN: 24 mg/dL — ABNORMAL HIGH (ref 6–23)
CO2: 33 mmol/L — ABNORMAL HIGH (ref 19–32)
Calcium: 7.7 mg/dL — ABNORMAL LOW (ref 8.4–10.5)
Chloride: 93 mmol/L — ABNORMAL LOW (ref 96–112)
Creatinine, Ser: 2.97 mg/dL — ABNORMAL HIGH (ref 0.50–1.35)
GFR calc Af Amer: 24 mL/min — ABNORMAL LOW (ref >90)
GFR calc non Af Amer: 21 mL/min — ABNORMAL LOW (ref >90)
Glucose, Bld: 138 mg/dL — ABNORMAL HIGH (ref 70–99)
PHOSPHORUS: 2.7 mg/dL (ref 2.3–4.6)
POTASSIUM: 4.2 mmol/L (ref 3.5–5.1)
SODIUM: 133 mmol/L — AB (ref 135–145)

## 2014-12-01 LAB — PROCALCITONIN: PROCALCITONIN: 26.51 ng/mL

## 2014-12-01 LAB — BASIC METABOLIC PANEL
ANION GAP: 7 (ref 5–15)
BUN: 25 mg/dL — AB (ref 6–23)
CHLORIDE: 96 mmol/L (ref 96–112)
CO2: 31 mmol/L (ref 19–32)
Calcium: 7.5 mg/dL — ABNORMAL LOW (ref 8.4–10.5)
Creatinine, Ser: 3.01 mg/dL — ABNORMAL HIGH (ref 0.50–1.35)
GFR, EST AFRICAN AMERICAN: 24 mL/min — AB (ref >90)
GFR, EST NON AFRICAN AMERICAN: 21 mL/min — AB (ref >90)
Glucose, Bld: 151 mg/dL — ABNORMAL HIGH (ref 70–99)
POTASSIUM: 4.3 mmol/L (ref 3.5–5.1)
SODIUM: 134 mmol/L — AB (ref 135–145)

## 2014-12-01 LAB — MAGNESIUM: MAGNESIUM: 2.1 mg/dL (ref 1.5–2.5)

## 2014-12-01 LAB — VANCOMYCIN, TROUGH: VANCOMYCIN TR: 23.4 ug/mL — AB (ref 10.0–20.0)

## 2014-12-01 LAB — PHOSPHORUS: Phosphorus: 3.3 mg/dL (ref 2.3–4.6)

## 2014-12-01 MED ORDER — VITAL HIGH PROTEIN PO LIQD
1000.0000 mL | ORAL | Status: DC
  Administered 2014-12-01 – 2014-12-02 (×2): 1000 mL
  Filled 2014-12-01 (×2): qty 1000

## 2014-12-01 MED ORDER — VASOPRESSIN 20 UNIT/ML IV SOLN
0.0300 [IU]/min | INTRAVENOUS | Status: DC
  Administered 2014-12-01 – 2014-12-06 (×6): 0.03 [IU]/min via INTRAVENOUS
  Filled 2014-12-01 (×6): qty 2

## 2014-12-01 MED ORDER — AMIODARONE IV BOLUS ONLY 150 MG/100ML
150.0000 mg | Freq: Once | INTRAVENOUS | Status: AC
  Administered 2014-12-01: 150 mg via INTRAVENOUS

## 2014-12-01 NOTE — Progress Notes (Signed)
Patient ID: Allen Green, male   DOB: 1950-12-19, 64 y.o.   MRN: [id number]    Subjective: On vent, just sedated, minimally responsive. He has been awake at times.  Objective: Vital signs in last 24 hours: Temp:  [96.3 F (35.7 C)-98.2 F (36.8 C)] 97 F (36.1 C) (04/30 0650) Pulse Rate:  [47-134] 81 (04/30 0650) Resp:  [19-26] 22 (04/30 0650) BP: (63-120)/(15-75) 114/50 mmHg (04/30 0600) SpO2:  [92 %-100 %] 100 % (04/30 0650) FiO2 (%):  [30 %-40 %] 30 % (04/30 0417) Weight:  [122.9 kg (270 lb 15.1 oz)] 122.9 kg (270 lb 15.1 oz) (04/30 0459) Last BM Date: 11/28/14  Intake/Output from previous day: 04/29 0701 - 04/30 0700 In: 4003.8 [I.V.:2593.8; NG/GT:240; IV Piggyback:1100] Out: 5621 [Urine:5; Emesis/NG output:1360; Drains:30] Intake/Output this shift:    General appearance: minimally responsive on ventilator, obese, ill appearing Resp: breath sounds equal bilaterally GI: nno apparent tenderness but sedated. Obese. Percutaneous drain in right upper quadrant with clear bilious drainage  Lab Results:   Recent Labs  11/30/14 0500 12/01/14 0430  WBC 36.0* 23.7*  HGB 12.4* 10.9*  HCT 39.4 34.3*  PLT 215 164   BMET  Recent Labs  11/30/14 1600 12/01/14 0430  NA 135 134*  K 4.9 4.3  CL 100 96  CO2 28 31  GLUCOSE 111* 151*  BUN 26* 25*  CREATININE 3.04* 3.01*  CALCIUM 7.5* 7.5*   Hepatic Function Latest Ref Rng 11/30/2014 11/29/2014 11/28/2014  Total Protein 6.0 - 8.3 g/dL - - -  Albumin 3.5 - 5.2 g/dL 2.3(L) 2.5(L) 2.6(L)  AST 0 - 37 U/L - - -  ALT 0 - 53 U/L - - -  Alk Phosphatase 39 - 117 U/L - - -  Total Bilirubin 0.3 - 1.2 mg/dL - - -  Bilirubin, Direct 0.0 - 0.5 mg/dL - - -     Studies/Results: Ct Abdomen Pelvis Wo Contrast  11/30/2014   CLINICAL DATA:  Abdominal distention, vomiting.  EXAM: CT ABDOMEN AND PELVIS WITHOUT CONTRAST  TECHNIQUE: Multidetector CT imaging of the abdomen and pelvis was performed following the standard protocol without IV contrast.   COMPARISON:  01/27/2015 contrast study  FINDINGS: Lower chest: Emphysematous changes noted in the lung bases. Trace bilateral pleural effusions, new since prior study. Bibasilar atelectasis. Heart is normal size. Densely calcified coronary arteries.  Hepatobiliary: Cholecystostomy tube has been placed into the gallbladder. Gallbladder is decompressed. Small amount of perihepatic ascites, new since prior study. No focal hepatic abnormality visualized on this unenhanced study.  Pancreas: No focal abnormality or ductal dilatation.  Spleen: No focal abnormality.  Normal size.  Adrenals/Urinary Tract: No focal renal or adrenal abnormality. No hydronephrosis. Urinary bladder is decompressed with Foley catheter in place.  Stomach/Bowel: NG tube present in the stomach which is unremarkable. Mildly prominent abdominal small bowel loops within the mid jejunum. Questionable mild wall thickening in these mildly prominent small bowel loops, best seen on images 66-75 of series 2. Transition to normal caliber small bowel noted on image 66. Appendix is visualized and unremarkable.  Vascular/Lymphatic: Aortic and iliac calcifications. No aneurysm. No retroperitoneal or mesenteric adenopathy.  Reproductive: No mass or other significant abnormality.  Other: Trace free fluid in the pelvis and adjacent to the liver.  Musculoskeletal: No focal bone lesion or acute bony abnormality.  IMPRESSION: Mildly prominent central small bowel loops in the lower abdomen and upper pelvis, new since prior study. There is questionable wall thickening within these loops suggesting the possibility of  enteritis. This alternatively could represent early partial small bowel obstruction, but no visible abnormality at the transition point.  Interval placement of cholecystostomy tube with decompressed gallbladder.  New trace bilateral pleural effusions and trace free fluid in the abdomen and pelvis.  Coronary artery disease, COPD.   Electronically Signed   By:  Rolm Baptise M.D.   On: 11/30/2014 12:48   Dg Chest Port 1 View  11/30/2014   CLINICAL DATA:  Shortness of breath.  EXAM: PORTABLE CHEST - 1 VIEW  COMPARISON:  Same day.  FINDINGS: Stable cardiomediastinal silhouette endotracheal nasogastric tubes are unchanged. Right internal jugular catheter line is unchanged. Stable bibasilar opacities are noted concerning for subsegmental atelectasis, pneumonia or edema. No pneumothorax or significant pleural effusion is noted.  IMPRESSION: Stable bibasilar opacities are noted concerning for subsegmental atelectasis, pneumonia or edema. Stable support apparatus.   Electronically Signed   By: Marijo Conception, M.D.   On: 11/30/2014 09:40   Dg Chest Port 1 View  11/30/2014   CLINICAL DATA:  Respiratory failure.  EXAM: PORTABLE CHEST - 1 VIEW  COMPARISON:  11/29/2014 .  FINDINGS: Endotracheal tube, NG tube, bilateral IJ lines in stable position. Mediastinum and hilar structures are stable. Persistent bibasilar atelectasis and/or infiltrates. No change. No prominent pleural effusion. No pneumothorax.  IMPRESSION: 1. Lines and tubes in stable position. 2. Persistent bibasilar subsegmental atelectasis and/or infiltrates.   Electronically Signed   By: Marcello Moores  Register   On: 11/30/2014 07:04    Anti-infectives: Anti-infectives    Start     Dose/Rate Route Frequency Ordered Stop   11/30/14 1200  meropenem (MERREM) 1 g in sodium chloride 0.9 % 100 mL IVPB     1 g 200 mL/hr over 30 Minutes Intravenous Every 12 hours 11/30/14 1124     11/30/14 0930  vancomycin (VANCOCIN) 1,250 mg in sodium chloride 0.9 % 250 mL IVPB     1,250 mg 166.7 mL/hr over 90 Minutes Intravenous  Once 11/30/14 0829 11/30/14 1100   11/28/14 2000  piperacillin-tazobactam (ZOSYN) IVPB 2.25 g  Status:  Discontinued     2.25 g 100 mL/hr over 30 Minutes Intravenous Every 8 hours 11/28/14 1357 11/28/14 1853   11/28/14 2000  piperacillin-tazobactam (ZOSYN) IVPB 3.375 g  Status:  Discontinued     3.375  g 100 mL/hr over 30 Minutes Intravenous Every 6 hours 11/28/14 1854 11/30/14 1101   11/28/14 1930  ciprofloxacin (CIPRO) IVPB 400 mg     400 mg 200 mL/hr over 60 Minutes Intravenous Every 12 hours 11/28/14 1851     11/27/14 1700  vancomycin (VANCOCIN) IVPB 750 mg/150 ml premix  Status:  Discontinued     750 mg 150 mL/hr over 60 Minutes Intravenous Every 12 hours 11/27/14 0640 11/28/14 1141   11/27/14 1000  piperacillin-tazobactam (ZOSYN) IVPB 3.375 g  Status:  Discontinued     3.375 g 12.5 mL/hr over 240 Minutes Intravenous Every 8 hours 11/27/14 0435 11/28/14 1357   11/27/14 0130  vancomycin (VANCOCIN) 2,000 mg in sodium chloride 0.9 % 500 mL IVPB     2,000 mg 250 mL/hr over 120 Minutes Intravenous  Once 11/27/14 0120 11/27/14 0643   11/27/14 0130  piperacillin-tazobactam (ZOSYN) IVPB 3.375 g     3.375 g 100 mL/hr over 30 Minutes Intravenous  Once 11/27/14 0120 11/27/14 0227      Assessment/Plan: Acute cholecystitis, severe COPD. Status post percutaneous drainage of gallbladder. Multiple system organ failure. CT shows catheter in good position and the decompressed gallbladder.  Catheter draining clear bile. Source appears to be controlled with no other apparent septic source in the abdomen. Continued drainage, antibiotics and supportive care    LOS: 4 days    Ethelbert Thain T 12/01/2014

## 2014-12-01 NOTE — Progress Notes (Signed)
Patient ID: Allen Green, male   DOB: Jun 28, 1951, 64 y.o.   MRN: [id number]  [facility] Progress Note    Assessment/ Plan:   1. Septic shock secondary to gallbladder abscess/cholecystitis: On antibiotic therapy/supportive management for multi-organ dysfunction-CT scan reviewed by surgery and found to have good position of percutaneous gallbladder drain and well decompressed gallbladder. No surgical intervention planned. Now on dual pressors-Levophed and vasopressin 2. Acute renal failure: Secondary to septic shock and contrast-induced nephropathy-remains anuric and without any evident renal recovery. Continue CRRT on current prescription-no ultrafiltration due to continued pressor needs. 3. Hyperkalemia: Corrected with CRRT-continue to monitor. 4. Polycythemia: Chronic and secondary to underlying COPD/chronic hypoxia. He has an outpatient follow-up with hematology. 5. Chronic obstructive lung disease: Currently on ventilator-further management per CCM  Subjective:   No acute events overnight-continues to require pressors    Objective:   BP 104/50 mmHg  Pulse 86  Temp(Src) 98.2 F (36.8 C) (Core (Comment))  Resp 19  Ht 5\' 11"  (1.803 m)  Wt 122.9 kg (270 lb 15.1 oz)  BMI 37.81 kg/m2  SpO2 93%  Intake/Output Summary (Last 24 hours) at 12/01/14 1210 Last data filed at 12/01/14 1200  Gross per 24 hour  Intake 3529.7 ml  Output   3114 ml  Net  415.7 ml   Weight change: 0.9 kg (1 lb 15.8 oz)  Physical Exam: Gen: Intubated, sedated CVS: Pulse regular in rate and rhythm, S1 and S2 normal Resp: Anteriorly clear to auscultation, no rales Abd: Soft, obese, nontender, right upper quadrant drain Ext: 1+ edema of her extremities, trace to 1+ edema lower extremities  Imaging: Ct Abdomen Pelvis Wo Contrast  11/30/2014   CLINICAL DATA:  Abdominal distention, vomiting.  EXAM: CT ABDOMEN AND PELVIS WITHOUT CONTRAST  TECHNIQUE: Multidetector CT imaging of the abdomen and pelvis was  performed following the standard protocol without IV contrast.  COMPARISON:  01/27/2015 contrast study  FINDINGS: Lower chest: Emphysematous changes noted in the lung bases. Trace bilateral pleural effusions, new since prior study. Bibasilar atelectasis. Heart is normal size. Densely calcified coronary arteries.  Hepatobiliary: Cholecystostomy tube has been placed into the gallbladder. Gallbladder is decompressed. Small amount of perihepatic ascites, new since prior study. No focal hepatic abnormality visualized on this unenhanced study.  Pancreas: No focal abnormality or ductal dilatation.  Spleen: No focal abnormality.  Normal size.  Adrenals/Urinary Tract: No focal renal or adrenal abnormality. No hydronephrosis. Urinary bladder is decompressed with Foley catheter in place.  Stomach/Bowel: NG tube present in the stomach which is unremarkable. Mildly prominent abdominal small bowel loops within the mid jejunum. Questionable mild wall thickening in these mildly prominent small bowel loops, best seen on images 66-75 of series 2. Transition to normal caliber small bowel noted on image 66. Appendix is visualized and unremarkable.  Vascular/Lymphatic: Aortic and iliac calcifications. No aneurysm. No retroperitoneal or mesenteric adenopathy.  Reproductive: No mass or other significant abnormality.  Other: Trace free fluid in the pelvis and adjacent to the liver.  Musculoskeletal: No focal bone lesion or acute bony abnormality.  IMPRESSION: Mildly prominent central small bowel loops in the lower abdomen and upper pelvis, new since prior study. There is questionable wall thickening within these loops suggesting the possibility of enteritis. This alternatively could represent early partial small bowel obstruction, but no visible abnormality at the transition point.  Interval placement of cholecystostomy tube with decompressed gallbladder.  New trace bilateral pleural effusions and trace free fluid in the abdomen and  pelvis.  Coronary  artery disease, COPD.   Electronically Signed   By: Rolm Baptise M.D.   On: 11/30/2014 12:48   Dg Chest Port 1 View  11/30/2014   CLINICAL DATA:  Shortness of breath.  EXAM: PORTABLE CHEST - 1 VIEW  COMPARISON:  Same day.  FINDINGS: Stable cardiomediastinal silhouette endotracheal nasogastric tubes are unchanged. Right internal jugular catheter line is unchanged. Stable bibasilar opacities are noted concerning for subsegmental atelectasis, pneumonia or edema. No pneumothorax or significant pleural effusion is noted.  IMPRESSION: Stable bibasilar opacities are noted concerning for subsegmental atelectasis, pneumonia or edema. Stable support apparatus.   Electronically Signed   By: Marijo Conception, M.D.   On: 11/30/2014 09:40   Dg Chest Port 1 View  11/30/2014   CLINICAL DATA:  Respiratory failure.  EXAM: PORTABLE CHEST - 1 VIEW  COMPARISON:  11/29/2014 .  FINDINGS: Endotracheal tube, NG tube, bilateral IJ lines in stable position. Mediastinum and hilar structures are stable. Persistent bibasilar atelectasis and/or infiltrates. No change. No prominent pleural effusion. No pneumothorax.  IMPRESSION: 1. Lines and tubes in stable position. 2. Persistent bibasilar subsegmental atelectasis and/or infiltrates.   Electronically Signed   By: Marcello Moores  Register   On: 11/30/2014 07:04    Labs: BMET  Recent Labs Lab 11/28/14 1423 11/28/14 2130 11/29/14 0420 11/29/14 1600 11/30/14 0500 11/30/14 1600 12/01/14 0430  NA 136 135 134* 135 137 135 134*  K 6.0* 6.0* 5.6* 5.8* 5.5* 4.9 4.3  CL 103 103 102 103 103 100 96  CO2 23 24 23 25 25 28 31   GLUCOSE 129* 98 100* 112* 151* 111* 151*  BUN 42* 36* 32* 27* 25* 26* 25*  CREATININE 4.50* 4.07* 3.91* 3.35* 3.09* 3.04* 3.01*  CALCIUM 8.1* 7.6* 7.5* 7.6* 8.0* 7.5* 7.5*  PHOS  --  5.9*  --  5.3* 4.8* 3.6 3.3   CBC  Recent Labs Lab 11/18/2014 2154 11/27/14 0320 11/28/14 0310 11/29/14 0420 11/30/14 0500 12/01/14 0430  WBC 22.3* 23.0* 37.3*  32.9* 36.0* 23.7*  NEUTROABS 17.8* 20.0*  --  30.9*  --   --   HGB 15.2 14.6 14.7 13.0 12.4* 10.9*  HCT 44.9 44.5 45.3 41.2 39.4 34.3*  MCV 94.3 95.3 96.0 97.9 98.0 95.0  PLT 398 365 308 238 215 164    Medications:    . antiseptic oral rinse  7 mL Mouth Rinse QID  . chlorhexidine  15 mL Mouth Rinse BID  . ciprofloxacin  400 mg Intravenous Q12H  . feeding supplement (PRO-STAT SUGAR FREE 64)  30 mL Per Tube TID  . feeding supplement (VITAL HIGH PROTEIN)  1,000 mL Per Tube Q24H  . fentaNYL (SUBLIMAZE) injection  50 mcg Intravenous Once  . heparin subcutaneous  5,000 Units Subcutaneous 3 times per day  . ipratropium-albuterol  3 mL Nebulization Q6H  . meropenem (MERREM) IV  1 g Intravenous Q12H  . pantoprazole (PROTONIX) IV  40 mg Intravenous Q24H  . sodium chloride  3 mL Intravenous Q12H   Elmarie Shiley, MD 12/01/2014, 12:10 PM

## 2014-12-01 NOTE — Progress Notes (Signed)
PULMONARY / CRITICAL CARE MEDICINE   Name: Allen Green MRN: [id number] DOB: 1951/07/05    ADMISSION DATE:  11/09/2014 CONSULTATION DATE: 4/26  REFERRING MD :  Triad  CHIEF COMPLAINT:  SOB  INITIAL PRESENTATION: 64 y/o male admitted on 4/26 with septic shock and acute respiratory failure due to cholecystitis and an abscess associated with a recently dislodged gallbladder drain.  STUDIES:  4/26 CT Abdomen> acute cholecystitis and gallbladder tract abscess 4/26 ECHO> poor study, can't comment on LV or RV size/function 4/29 CT abdomen >> Mildly prominent central small bowel loops in the lower abdomen and upper pelvis , decompressed gallbladder.  SIGNIFICANT EVENTS: 4/26 intubated, Gallbladder drain placed by IR 4/29 multiple amio bolus for RVR   SUBJECTIVE:  Sedated on max dose fent gtt Back in nsr on amio gtt Afebrile On CVVH   VITAL SIGNS: Temp:  [96.3 F (35.7 C)-98.2 F (36.8 C)] 97.7 F (36.5 C) (04/30 1000) Pulse Rate:  [47-134] 85 (04/30 1000) Resp:  [10-26] 10 (04/30 1000) BP: (63-126)/(39-75) 94/45 mmHg (04/30 1000) SpO2:  [92 %-100 %] 94 % (04/30 1000) FiO2 (%):  [30 %-40 %] 30 % (04/30 0807) Weight:  [270 lb 15.1 oz (122.9 kg)] 270 lb 15.1 oz (122.9 kg) (04/30 0459) HEMODYNAMICS: CVP:  [8 mmHg-15 mmHg] 8 mmHg VENTILATOR SETTINGS: Vent Mode:  [-] PRVC FiO2 (%):  [30 %-40 %] 30 % Set Rate:  [22 bmp] 22 bmp Vt Set:  [500 mL] 500 mL PEEP:  [5 cmH20] 5 cmH20 Plateau Pressure:  [17 cmH20-24 cmH20] 20 cmH20 INTAKE / OUTPUT:  Intake/Output Summary (Last 24 hours) at 12/01/14 1028 Last data filed at 12/01/14 1000  Gross per 24 hour  Intake 3693.01 ml  Output   2871 ml  Net 822.01 ml    PHYSICAL EXAMINATION: General:  On vent, sedated HENT: NCAT, ETT in place, no neck PULM: CTA B, autopeep dropped to 5 with rr 20 CV: Tachy, regular, no mgr GI: BS+, soft, drain in place, large Derm: improved redness, clear bile GB drain Neuro: sedated or  agitated  LABS:  CBC  Recent Labs Lab 11/29/14 0420 11/30/14 0500 12/01/14 0430  WBC 32.9* 36.0* 23.7*  HGB 13.0 12.4* 10.9*  HCT 41.2 39.4 34.3*  PLT 238 215 164   Coag's  Recent Labs Lab 11/27/14 0525 11/28/14 0310  12/01/14 0015 12/01/14 0430 12/01/14 0828  APTT 30 38*  < > 71* 78* 74*  INR 1.08 1.36  --   --   --   --   < > = values in this interval not displayed. BMET  Recent Labs Lab 11/30/14 0500 11/30/14 1600 12/01/14 0430  NA 137 135 134*  K 5.5* 4.9 4.3  CL 103 100 96  CO2 25 28 31   BUN 25* 26* 25*  CREATININE 3.09* 3.04* 3.01*  GLUCOSE 151* 111* 151*   Electrolytes  Recent Labs Lab 11/29/14 0420  11/30/14 0500 11/30/14 1600 12/01/14 0430  CALCIUM 7.5*  < > 8.0* 7.5* 7.5*  MG 1.8  --  2.2  --  2.1  PHOS  --   < > 4.8* 3.6 3.3  < > = values in this interval not displayed. Sepsis Markers  Recent Labs Lab 11/27/14 0320 11/28/14 0310 11/29/14 0420 11/30/14 1010 12/01/14 0430  LATICACIDVEN 1.1 1.9  --  2.1*  --   PROCALCITON 0.14  --  56.94  --  26.51   ABG  Recent Labs Lab 11/30/14 0730 11/30/14 1000 11/30/14 1230  PHART 7.173*  7.302* 7.238*  PCO2ART 69.7* 46.0* 58.7*  PO2ART 74.4* 94.0 73.1*   Liver Enzymes  Recent Labs Lab 11/03/2014 2154 11/27/14 0320 11/28/14 2130 11/29/14 1600 11/30/14 1600  AST 18 18  --   --   --   ALT 21 20  --   --   --   ALKPHOS 72 70  --   --   --   BILITOT 0.4 0.8  --   --   --   ALBUMIN 3.7 3.3* 2.6* 2.5* 2.3*   Cardiac Enzymes  Recent Labs Lab 11/27/14 1250 11/27/14 2048 11/28/14 1010  TROPONINI <0.03 <0.03 <0.03   Glucose  Recent Labs Lab 11/30/14 0404 11/30/14 0744 11/30/14 1941 12/01/14 0012 12/01/14 0448 12/01/14 0800  GLUCAP 128* 159* 124* 146* 144* 132*    Imaging    ASSESSMENT / PLAN:  PULMONARY OETT 4/26(PCCM)>> A: Acute respiratory failure with hypercapnea> improved 4/27, passing SBT Severe COPD, but doing surprisingly well on 4/27 AM, so an elective  Ex-lap as outpatient for cholecystectomy not unreasonable Tobacco abuse Mixed acidosis   P:   Hold wean as overall clinical picture worse (sepsis, AKI, etc) Vent bundle BD's May need trach if he survives current insult  RR to 20 to avoid  autopeep  CARDIOVASCULAR CVL 4/26 L IJ CVL>> A:  Septic shock > worse 4/29, echo not helpful,? Cardiogenic component, more septic at this point -Repeat troponin>>neg P:  Levophed for MAP > 65 as needed Add vaso Place a -line Ct amio gtt- short term  RENAL Lab Results  Component Value Date   CREATININE 3.01* 12/01/2014   CREATININE 3.04* 11/30/2014   CREATININE 3.09* 11/30/2014    Recent Labs Lab 11/30/14 0500 11/30/14 1600 12/01/14 0430  K 5.5* 4.9 4.3      A: Oliguric AKI P:   Monitor BMET and UOP Renal consult ct CVVH started 4/27  GASTROINTESTINAL A:   Acute Cholecystis > with peri-gallbladder abscess, s/p drain 4/26 per IR Recent IR drain 4/3->4/12  P:   Abx as noted F/U surgery recs Continue Antibiotic support Monitor drain output 4/29 repeat ct abd to evaluate possible infection  HEMATOLOGIC A:   Chronic polycythemia  P:  Follow H/H Monitor for bleeding  INFECTIOUS A:   Septic shock> worse 4/29 likely all related to draining abscess on 4/26, don't suspect secondary source or inappropriate antibiotic coverage at this point, cipro added 4/27 4/29 recheck ct abd P:   BCx2  4/26>>bacteroides UC  4/26>> Sputum 4/26>>nl flora Wound culture 4/26 > bacteroides 4/26 c dif>>neg Abx:  4/26 vanc>> 4/30 4/26 zoysn>> 4/27 cipro>>  ENDOCRINE CBG (last 3)   Recent Labs  12/01/14 0012 12/01/14 0448 12/01/14 0800  GLUCAP 146* 144* 132*     A:   No acute issue P:   SSI if needed  NEUROLOGIC A:   Intact prior to intubation P:   RASS goal: -1 Continue fentanyl per PAD protocol + prn versed   FAMILY  - Updates: updated at length 4/27 AM bedside  - Inter-disciplinary family meet or Palliative  Care meeting due by:  day 7  Summary - remains in profound shock with autoPEEP  The patient is critically ill with multiple organ systems failure and requires high complexity decision making for assessment and support, frequent evaluation and titration of therapies, application of advanced monitoring technologies and extensive interpretation of multiple databases. Critical Care Time devoted to patient care services described in this note independent of APP time is 35 minutes.  Kara Mead MD. Shade Flood. [facility] Pager [phone] If no response call [phone]     12/01/2014, 10:28 AM

## 2014-12-01 NOTE — Procedures (Signed)
Arterial Catheter Insertion Procedure Note Allen Green [id number] 1951-01-20  Procedure: Insertion of Arterial Catheter  Indications: Blood pressure monitoring  Procedure Details Consent: Risks of procedure as well as the alternatives and risks of each were explained to the (patient/caregiver).  Consent for procedure obtained. Time Out: Verified patient identification, verified procedure, site/side was marked, verified correct patient position, special equipment/implants available, medications/allergies/relevent history reviewed, required imaging and test results available.  Performed  Maximum sterile technique was used including antiseptics, cap, gloves, gown, hand hygiene, mask and sheet. Skin prep: Chlorhexidine; local anesthetic administered 20 gauge catheter was inserted into right radial artery using the Seldinger technique.  Evaluation Blood flow good; BP tracing good. Complications: No apparent complications.   Janett Labella 12/01/2014

## 2014-12-01 NOTE — Progress Notes (Signed)
eLink Physician-Brief Progress Note Patient Name: Allen Green DOB: 1950-10-16 MRN: [id number]   Date of Service  12/01/2014  HPI/Events of Note  Needs restraints reordered.   eICU Interventions  Bilateral wrist restraints reordered.      Intervention Category Minor Interventions: Agitation / anxiety - evaluation and management  Sommer,Steven Eugene 12/01/2014, 7:20 PM

## 2014-12-01 NOTE — Progress Notes (Signed)
eLink Physician-Brief Progress Note Patient Name: Allen Green DOB: [birth year] MRN: [id number]   Date of Service  12/01/2014  HPI/Events of Note  Ventricular rate = 120.  eICU Interventions  Will give 3rd bolus of Amiodarone 150 mg IV.     Intervention Category Major Interventions: Arrhythmia - evaluation and management  Sommer,Steven Eugene 12/01/2014, 1:04 AM

## 2014-12-02 ENCOUNTER — Inpatient Hospital Stay (HOSPITAL_COMMUNITY): Payer: Medicare Other

## 2014-12-02 ENCOUNTER — Encounter (HOSPITAL_COMMUNITY): Payer: Medicare Other

## 2014-12-02 DIAGNOSIS — M7989 Other specified soft tissue disorders: Secondary | ICD-10-CM

## 2014-12-02 LAB — CBC
HCT: 33.4 % — ABNORMAL LOW (ref 39.0–52.0)
Hemoglobin: 11.2 g/dL — ABNORMAL LOW (ref 13.0–17.0)
MCH: 31 pg (ref 26.0–34.0)
MCHC: 33.5 g/dL (ref 30.0–36.0)
MCV: 92.5 fL (ref 78.0–100.0)
PLATELETS: 166 10*3/uL (ref 150–400)
RBC: 3.61 MIL/uL — ABNORMAL LOW (ref 4.22–5.81)
RDW: 15.8 % — ABNORMAL HIGH (ref 11.5–15.5)
WBC: 20.3 10*3/uL — ABNORMAL HIGH (ref 4.0–10.5)

## 2014-12-02 LAB — BLOOD GAS, ARTERIAL
ACID-BASE EXCESS: 5.5 mmol/L — AB (ref 0.0–2.0)
Acid-Base Excess: 5.6 mmol/L — ABNORMAL HIGH (ref 0.0–2.0)
Acid-Base Excess: 7.6 mmol/L — ABNORMAL HIGH (ref 0.0–2.0)
BICARBONATE: 32.4 meq/L — AB (ref 20.0–24.0)
BICARBONATE: 32.4 meq/L — AB (ref 20.0–24.0)
BICARBONATE: 34.9 meq/L — AB (ref 20.0–24.0)
Drawn by: 308601
Drawn by: 232811
FIO2: 0.3 %
FIO2: 1 %
FIO2: 0.9 %
MECHVT: 500 mL
MECHVT: 500 mL
O2 SAT: 60.9 %
O2 SAT: 93.3 %
O2 Saturation: 95.2 %
PATIENT TEMPERATURE: 36.5
PCO2 ART: 60.5 mmHg — AB (ref 35.0–45.0)
PCO2 ART: 66.7 mmHg — AB (ref 35.0–45.0)
PEEP/CPAP: 10 cmH2O
PEEP: 5 cmH2O
PEEP: 5 cmH2O
PO2 ART: 77.7 mmHg — AB (ref 80.0–100.0)
Patient temperature: 36.5
Patient temperature: 37
RATE: 20 resp/min
RATE: 20 resp/min
RATE: 20 resp/min
TCO2: 30.2 mmol/L (ref 0–100)
TCO2: 29.9 mmol/L (ref 0–100)
TCO2: 32.2 mmol/L (ref 0–100)
VT: 500 mL
pCO2 arterial: 60.1 mmHg (ref 35.0–45.0)
pH, Arterial: 7.348 — ABNORMAL LOW (ref 7.350–7.450)
pH, Arterial: 7.345 — ABNORMAL LOW (ref 7.350–7.450)
pH, Arterial: 7.339 — ABNORMAL LOW (ref 7.350–7.450)
pO2, Arterial: 34.8 mmHg — CL (ref 80.0–100.0)
pO2, Arterial: 86.9 mmHg (ref 80.0–100.0)

## 2014-12-02 LAB — RENAL FUNCTION PANEL
Albumin: 2.4 g/dL — ABNORMAL LOW (ref 3.5–5.0)
Anion gap: 9 (ref 5–15)
BUN: 28 mg/dL — ABNORMAL HIGH (ref 6–20)
CHLORIDE: 92 mmol/L — AB (ref 101–111)
CO2: 33 mmol/L — ABNORMAL HIGH (ref 22–32)
CREATININE: 2.71 mg/dL — AB (ref 0.61–1.24)
Calcium: 7.8 mg/dL — ABNORMAL LOW (ref 8.9–10.3)
GFR calc Af Amer: 27 mL/min — ABNORMAL LOW (ref >60)
GFR calc non Af Amer: 23 mL/min — ABNORMAL LOW (ref >60)
Glucose, Bld: 143 mg/dL — ABNORMAL HIGH (ref 70–99)
Phosphorus: 3.1 mg/dL (ref 2.5–4.6)
Potassium: 4.2 mmol/L (ref 3.5–5.1)
SODIUM: 134 mmol/L — AB (ref 135–145)

## 2014-12-02 LAB — APTT
APTT: 58 s — AB (ref 24–37)
APTT: 59 s — AB (ref 24–37)
APTT: 62 s — AB (ref 24–37)
APTT: 66 s — AB (ref 24–37)
aPTT: 57 seconds — ABNORMAL HIGH (ref 24–37)
aPTT: 65 seconds — ABNORMAL HIGH (ref 24–37)

## 2014-12-02 LAB — GLUCOSE, CAPILLARY
GLUCOSE-CAPILLARY: 133 mg/dL — AB (ref 70–99)
Glucose-Capillary: 133 mg/dL — ABNORMAL HIGH (ref 70–99)
Glucose-Capillary: 143 mg/dL — ABNORMAL HIGH (ref 70–99)
Glucose-Capillary: 144 mg/dL — ABNORMAL HIGH (ref 70–99)
Glucose-Capillary: 149 mg/dL — ABNORMAL HIGH (ref 70–99)
Glucose-Capillary: 152 mg/dL — ABNORMAL HIGH (ref 70–99)
Glucose-Capillary: 159 mg/dL — ABNORMAL HIGH (ref 70–99)

## 2014-12-02 LAB — MAGNESIUM: MAGNESIUM: 2.5 mg/dL — AB (ref 1.7–2.4)

## 2014-12-02 MED ORDER — QUETIAPINE FUMARATE 100 MG PO TABS
100.0000 mg | ORAL_TABLET | Freq: Two times a day (BID) | ORAL | Status: DC
  Administered 2014-12-02 – 2014-12-03 (×4): 100 mg via ORAL
  Filled 2014-12-02 (×4): qty 1

## 2014-12-02 MED ORDER — AMIODARONE HCL IN DEXTROSE 360-4.14 MG/200ML-% IV SOLN: 30.0000 mg/h | INTRAVENOUS | Status: DC

## 2014-12-02 MED ORDER — PHENYLEPHRINE HCL 10 MG/ML IJ SOLN
30.0000 ug/min | INTRAMUSCULAR | Status: DC
  Administered 2014-12-02 (×3): 110 ug/min via INTRAVENOUS
  Administered 2014-12-02: 40 ug/min via INTRAVENOUS
  Administered 2014-12-03 (×6): 150 ug/min via INTRAVENOUS
  Filled 2014-12-02 (×13): qty 1

## 2014-12-02 MED ORDER — SODIUM CHLORIDE 0.9 % IV SOLN
0.0000 mg/h | INTRAVENOUS | Status: DC
  Administered 2014-12-02 – 2014-12-03 (×2): 1 mg/h via INTRAVENOUS
  Filled 2014-12-02 (×3): qty 10

## 2014-12-02 MED ORDER — AMIODARONE HCL IN DEXTROSE 360-4.14 MG/200ML-% IV SOLN
30.0000 mg/h | INTRAVENOUS | Status: DC
  Administered 2014-12-02 – 2014-12-08 (×11): 30 mg/h via INTRAVENOUS
  Filled 2014-12-02 (×12): qty 200

## 2014-12-02 NOTE — Progress Notes (Signed)
eLink Physician-Brief Progress Note Patient Name: Allen Green DOB: 1950/12/07 MRN: [id number]   Date of Service  12/02/2014  HPI/Events of Note  On 100% pao2 rising, assume now that pulse ox was inaccurate as metgb negative  eICU Interventions  Increase peep, the to 90% Repeat abg Re[peat pcxr     Intervention Category Major Interventions: Hypoxemia - evaluation and management  Raylene Miyamoto. 12/02/2014, 6:25 AM

## 2014-12-02 NOTE — Progress Notes (Signed)
[facility] Progress Note Patient Name: Allen Green DOB: May 25, 1951 MRN: [id number]   Date of Service  12/02/2014  HPI/Events of Note  Pt on cipro/merrum  eICU Interventions  D/c cipro     Intervention Category Major Interventions: Infection - evaluation and management  Asencion Noble 12/02/2014, 3:44 PM

## 2014-12-02 NOTE — Progress Notes (Signed)
Pt ET tube retracted 2 cm per MD. Tube secured at 24 cm at the lips, Pt tolerated well.  RT to monitor and assess as needed.

## 2014-12-02 NOTE — Progress Notes (Signed)
eLink Physician-Brief Progress Note Patient Name: Allen Green DOB: 1950/10/22 MRN: [id number]   Date of Service  12/02/2014  HPI/Events of Note  sats 94-100, wave form in question ABG sat poor, low Pao2 with normal methgb, blood appears dark No lueokocyte larcenty wbc  eICU Interventions  Raise to 100%, repeta abg     Intervention Category Major Interventions: Hypoxemia - evaluation and management  Stewart Pimenta J. 12/02/2014, 5:17 AM

## 2014-12-02 NOTE — Progress Notes (Signed)
Two rt's attempted aline and was successful. Pt had no adverse reaction to procedure.

## 2014-12-02 NOTE — Progress Notes (Signed)
PULMONARY / CRITICAL CARE MEDICINE   Name: Allen Green MRN: [id number] DOB: Jan 04, 1951    ADMISSION DATE:  11/08/2014 CONSULTATION DATE: 4/26  REFERRING MD :  Triad  CHIEF COMPLAINT:  SOB  INITIAL PRESENTATION: 64 y/o male admitted on 4/26 with septic shock and acute respiratory failure due to cholecystitis and an abscess associated with a recently dislodged gallbladder drain.  STUDIES:  4/26 CT Abdomen> acute cholecystitis and gallbladder tract abscess 4/26 ECHO> poor study, can't comment on LV or RV size/function 4/29 CT abdomen >> Mildly prominent central small bowel loops in the lower abdomen and upper pelvis , decompressed gallbladder.  SIGNIFICANT EVENTS: 4/26 intubated, Gallbladder drain placed by IR 4/29 multiple amio bolus for RVR 4/30 worsening hypoxia   SUBJECTIVE:   Agitated on max dose fent gtt remains on amio gtt Afebrile, On CVVH Worse hypoxia overnight requiring PEEP   VITAL SIGNS: Temp:  [97.5 F (36.4 C)-99 F (37.2 C)] 97.7 F (36.5 C) (05/01 0600) Pulse Rate:  [78-124] 124 (05/01 0600) Resp:  [10-23] 20 (05/01 0600) BP: (83-138)/(39-93) 138/49 mmHg (05/01 0315) SpO2:  [90 %-100 %] 100 % (05/01 0600) FiO2 (%):  [30 %-100 %] 90 % (05/01 0800) Weight:  [269 lb 13.5 oz (122.4 kg)] 269 lb 13.5 oz (122.4 kg) (05/01 0500) HEMODYNAMICS: CVP:  [7 mmHg-13 mmHg] 13 mmHg VENTILATOR SETTINGS: Vent Mode:  [-] PRVC FiO2 (%):  [30 %-100 %] 90 % Set Rate:  [20 bmp] 20 bmp Vt Set:  [500 mL] 500 mL PEEP:  [5 cmH20-10 cmH20] 10 cmH20 Plateau Pressure:  [14 cmH20-25 cmH20] 25 cmH20 INTAKE / OUTPUT:  Intake/Output Summary (Last 24 hours) at 12/02/14 0826 Last data filed at 12/02/14 0800  Gross per 24 hour  Intake 4061.49 ml  Output   4241 ml  Net -179.51 ml    PHYSICAL EXAMINATION: General:  On vent, agitated  HENT: NCAT, ETT in place, no neck PULM: CTA B, autopeep + CV: Tachy, regular, no mgr GI: BS+, soft, drain in place, large Derm: improved redness,  clear bile GB drain Neuro: sedated or agitated  LABS:  CBC  Recent Labs Lab 11/30/14 0500 12/01/14 0430 12/02/14 0340  WBC 36.0* 23.7* 20.3*  HGB 12.4* 10.9* 11.2*  HCT 39.4 34.3* 33.4*  PLT 215 164 166   Coag's  Recent Labs Lab 11/27/14 0525 11/28/14 0310  12/01/14 2015 12/02/14 0015 12/02/14 0340  APTT 30 38*  < > 88* 58* 57*  INR 1.08 1.36  --   --   --   --   < > = values in this interval not displayed. BMET  Recent Labs Lab 11/30/14 1600 12/01/14 0430 12/01/14 1600  NA 135 134* 133*  K 4.9 4.3 4.2  CL 100 96 93*  CO2 28 31 33*  BUN 26* 25* 24*  CREATININE 3.04* 3.01* 2.97*  GLUCOSE 111* 151* 138*   Electrolytes  Recent Labs Lab 11/30/14 0500 11/30/14 1600 12/01/14 0430 12/01/14 1600 12/02/14 0340  CALCIUM 8.0* 7.5* 7.5* 7.7*  --   MG 2.2  --  2.1  --  2.5*  PHOS 4.8* 3.6 3.3 2.7  --    Sepsis Markers  Recent Labs Lab 11/27/14 0320 11/28/14 0310 11/29/14 0420 11/30/14 1010 12/01/14 0430  LATICACIDVEN 1.1 1.9  --  2.1*  --   PROCALCITON 0.14  --  56.94  --  26.51   ABG  Recent Labs Lab 12/02/14 0332 12/02/14 0610 12/02/14 0748  PHART 7.348* 7.345* 7.339*  PCO2ART 60.1*  60.5* 66.7*  PO2ART 34.8* 86.9 77.7*   Liver Enzymes  Recent Labs Lab 11/11/2014 2154 11/27/14 0320  11/29/14 1600 11/30/14 1600 12/01/14 1600  AST 18 18  --   --   --   --   ALT 21 20  --   --   --   --   ALKPHOS 72 70  --   --   --   --   BILITOT 0.4 0.8  --   --   --   --   ALBUMIN 3.7 3.3*  < > 2.5* 2.3* 2.3*  < > = values in this interval not displayed. Cardiac Enzymes  Recent Labs Lab 11/27/14 1250 11/27/14 2048 11/28/14 1010  TROPONINI <0.03 <0.03 <0.03   Glucose  Recent Labs Lab 12/01/14 0800 12/01/14 1205 12/01/14 1622 12/01/14 1952 12/02/14 0006 12/02/14 0339  GLUCAP 132* 126* 129* 151* 133* 144*    Imaging    ASSESSMENT / PLAN:  PULMONARY OETT 4/26(PCCM)>> A: Acute respiratory failure with hypercapnea> improved  4/27, passing SBT Severe COPD, but doing surprisingly well on 4/27 AM, so an elective Ex-lap as outpatient for cholecystectomy not unreasonable Tobacco abuse Mixed acidosis  5/1 Worse hypoxia due to ? Vent asynchrony, doubt PE P:   Hold wean as overall clinical picture worse  Vent bundle BD's May need trach if he survives current insult  Keep RR to 20 to avoid  autopeep Better sedation Chk venous duplex  CARDIOVASCULAR CVL 4/26 L IJ CVL>> A:  Septic shock > worse 4/29, echo not helpful,? Cardiogenic component, more septic at this point -Repeat troponin>>neg P:  Levophed for MAP > 65 as needed Added vaso dc amio gtt  RENAL Lab Results  Component Value Date   CREATININE 2.97* 12/01/2014   CREATININE 3.01* 12/01/2014   CREATININE 3.04* 11/30/2014    Recent Labs Lab 11/30/14 1600 12/01/14 0430 12/01/14 1600  K 4.9 4.3 4.2      A: Oliguric AKI P:   Monitor BMET and UOP Renal consult ct CVVH started 4/27 -keep even while on pressors  GASTROINTESTINAL A:   Acute Cholecystis > with peri-gallbladder abscess, s/p drain 4/26 per IR Recent IR drain 4/3->4/12  P:   Monitor drain output Keep Tfs @ 20/h   HEMATOLOGIC A:   Chronic polycythemia  P:  Follow H/H Monitor for bleeding  INFECTIOUS A:   Septic shock> worse 4/29 likely all related to draining abscess on 4/26, don't suspect secondary source or inappropriate antibiotic coverage at this point, cipro added 4/27 4/29 recheck ct abd P:   BCx2  4/26>>bacteroides UC  4/26>> Sputum 4/26>>nl flora Wound culture 4/26 > bacteroides 4/26 c dif>>neg Abx:  4/26 vanc>> 4/30 4/26 zoysn>> 4/27 cipro>>  Rechk resp cx  ENDOCRINE CBG (last 3)   Recent Labs  12/01/14 1952 12/02/14 0006 12/02/14 0339  GLUCAP 151* 133* 144*     A:   No acute issue P:   SSI if needed  NEUROLOGIC A:  Intact prior to intubation Severe agitation/ asynchrony  P:   RASS goal: -1 Continue fentanyl per PAD  protocol Add versed gtt   FAMILY  - Updates: updated at length 4/27 AM bedside  - Inter-disciplinary family meet or Palliative Care meeting due by: 5/3  Summary - remains in  shock with autoPEEP & worsening hypoxia , likely due to asynchrony, doubt PE but will chk venous duplex  The patient is critically ill with multiple organ systems failure and requires high complexity decision making for  assessment and support, frequent evaluation and titration of therapies, application of advanced monitoring technologies and extensive interpretation of multiple databases. Critical Care Time devoted to patient care services described in this note independent of APP time is 35 minutes.   Kara Mead MD. Shade Flood. [facility] Pager [phone] If no response call [phone]     12/02/2014, 8:26 AM

## 2014-12-02 NOTE — Progress Notes (Signed)
EKG shows a flutter. Dr. Elsworth Soho aware. Restarted amiodarone drip. Will continue to monitor.

## 2014-12-02 NOTE — Progress Notes (Signed)
Patient ID: Allen Green, male   DOB: 1950/10/31, 64 y.o.   MRN: [id number]    Subjective: Sedated on vent, occasionally combative. Does not follow commands.  Objective: Vital signs in last 24 hours: Temp:  [97.3 F (36.3 C)-99 F (37.2 C)] 97.7 F (36.5 C) (05/01 0600) Pulse Rate:  [78-124] 124 (05/01 0600) Resp:  [10-23] 20 (05/01 0600) BP: (83-138)/(39-93) 138/49 mmHg (05/01 0315) SpO2:  [90 %-100 %] 100 % (05/01 0600) FiO2 (%):  [30 %-100 %] 90 % (05/01 0800) Weight:  [122.4 kg (269 lb 13.5 oz)] 122.4 kg (269 lb 13.5 oz) (05/01 0500) Last BM Date: 11/28/14  Intake/Output from previous day: 04/30 0701 - 05/01 0700 In: 4036.2 [I.V.:2604.9; NG/GT:596.3; IV Piggyback:600] Out: 4111 [Emesis/NG output:110; Drains:10] Intake/Output this shift:    General appearance: sedated on vent, responds to pain,  ill-appearing Resp: breath sounds equal bilaterally GI: no apparent tenderness. Biliary drain with clear bile.  Lab Results:   Recent Labs  12/01/14 0430 12/02/14 0340  WBC 23.7* 20.3*  HGB 10.9* 11.2*  HCT 34.3* 33.4*  PLT 164 166   BMET  Recent Labs  12/01/14 0430 12/01/14 1600  NA 134* 133*  K 4.3 4.2  CL 96 93*  CO2 31 33*  GLUCOSE 151* 138*  BUN 25* 24*  CREATININE 3.01* 2.97*  CALCIUM 7.5* 7.7*   Lab Results  Component Value Date   ALT 20 11/27/2014   AST 18 11/27/2014   ALKPHOS 70 11/27/2014   BILITOT 0.8 11/27/2014    Studies/Results: Ct Abdomen Pelvis Wo Contrast  11/30/2014   CLINICAL DATA:  Abdominal distention, vomiting.  EXAM: CT ABDOMEN AND PELVIS WITHOUT CONTRAST  TECHNIQUE: Multidetector CT imaging of the abdomen and pelvis was performed following the standard protocol without IV contrast.  COMPARISON:  01/27/2015 contrast study  FINDINGS: Lower chest: Emphysematous changes noted in the lung bases. Trace bilateral pleural effusions, new since prior study. Bibasilar atelectasis. Heart is normal size. Densely calcified coronary arteries.   Hepatobiliary: Cholecystostomy tube has been placed into the gallbladder. Gallbladder is decompressed. Small amount of perihepatic ascites, new since prior study. No focal hepatic abnormality visualized on this unenhanced study.  Pancreas: No focal abnormality or ductal dilatation.  Spleen: No focal abnormality.  Normal size.  Adrenals/Urinary Tract: No focal renal or adrenal abnormality. No hydronephrosis. Urinary bladder is decompressed with Foley catheter in place.  Stomach/Bowel: NG tube present in the stomach which is unremarkable. Mildly prominent abdominal small bowel loops within the mid jejunum. Questionable mild wall thickening in these mildly prominent small bowel loops, best seen on images 66-75 of series 2. Transition to normal caliber small bowel noted on image 66. Appendix is visualized and unremarkable.  Vascular/Lymphatic: Aortic and iliac calcifications. No aneurysm. No retroperitoneal or mesenteric adenopathy.  Reproductive: No mass or other significant abnormality.  Other: Trace free fluid in the pelvis and adjacent to the liver.  Musculoskeletal: No focal bone lesion or acute bony abnormality.  IMPRESSION: Mildly prominent central small bowel loops in the lower abdomen and upper pelvis, new since prior study. There is questionable wall thickening within these loops suggesting the possibility of enteritis. This alternatively could represent early partial small bowel obstruction, but no visible abnormality at the transition point.  Interval placement of cholecystostomy tube with decompressed gallbladder.  New trace bilateral pleural effusions and trace free fluid in the abdomen and pelvis.  Coronary artery disease, COPD.   Electronically Signed   By: Rolm Baptise M.D.   On:  11/30/2014 12:48   Dg Chest Port 1 View  12/02/2014   CLINICAL DATA:  Acute onset of respiratory failure. Initial encounter.  EXAM: PORTABLE CHEST - 1 VIEW  COMPARISON:  Chest radiograph performed 11/30/2014  FINDINGS: The  patient's endotracheal tube is seen ending just above the carina. This could be retracted 2 cm.  A right-sided dual-lumen catheter is noted ending about the mid SVC. A left IJ line is also noted ending about the mid SVC. An enteric tube is noted extending below the diaphragm.  Vascular congestion is noted. Bibasilar airspace opacities raise concern for mild interstitial edema. Pneumonia might have a similar appearance. No definite pleural effusion or pneumothorax is seen.  The cardiomediastinal silhouette is normal in size. No acute osseous abnormalities are identified.  IMPRESSION: 1. Endotracheal tube seen ending just above the carina. This could be retracted 2 cm. 2. Vascular congestion noted. Bibasilar airspace opacities raise concern for mild interstitial edema. Pneumonia might have a similar appearance.  These results were called by telephone at the time of interpretation on 12/02/2014 at 6:39 am to [place] at the [facility], who verbally acknowledged these results.   Electronically Signed   By: Garald Balding M.D.   On: 12/02/2014 06:40   Dg Chest Port 1 View  11/30/2014   CLINICAL DATA:  Shortness of breath.  EXAM: PORTABLE CHEST - 1 VIEW  COMPARISON:  Same day.  FINDINGS: Stable cardiomediastinal silhouette endotracheal nasogastric tubes are unchanged. Right internal jugular catheter line is unchanged. Stable bibasilar opacities are noted concerning for subsegmental atelectasis, pneumonia or edema. No pneumothorax or significant pleural effusion is noted.  IMPRESSION: Stable bibasilar opacities are noted concerning for subsegmental atelectasis, pneumonia or edema. Stable support apparatus.   Electronically Signed   By: Marijo Conception, M.D.   On: 11/30/2014 09:40    Anti-infectives: Anti-infectives    Start     Dose/Rate Route Frequency Ordered Stop   11/30/14 1200  meropenem (MERREM) 1 g in sodium chloride 0.9 % 100 mL IVPB     1 g 200 mL/hr over 30 Minutes Intravenous Every 12  hours 11/30/14 1124     11/30/14 0930  vancomycin (VANCOCIN) 1,250 mg in sodium chloride 0.9 % 250 mL IVPB     1,250 mg 166.7 mL/hr over 90 Minutes Intravenous  Once 11/30/14 0829 11/30/14 1100   11/28/14 2000  piperacillin-tazobactam (ZOSYN) IVPB 2.25 g  Status:  Discontinued     2.25 g 100 mL/hr over 30 Minutes Intravenous Every 8 hours 11/28/14 1357 11/28/14 1853   11/28/14 2000  piperacillin-tazobactam (ZOSYN) IVPB 3.375 g  Status:  Discontinued     3.375 g 100 mL/hr over 30 Minutes Intravenous Every 6 hours 11/28/14 1854 11/30/14 1101   11/28/14 1930  ciprofloxacin (CIPRO) IVPB 400 mg     400 mg 200 mL/hr over 60 Minutes Intravenous Every 12 hours 11/28/14 1851     11/27/14 1700  vancomycin (VANCOCIN) IVPB 750 mg/150 ml premix  Status:  Discontinued     750 mg 150 mL/hr over 60 Minutes Intravenous Every 12 hours 11/27/14 0640 11/28/14 1141   11/27/14 1000  piperacillin-tazobactam (ZOSYN) IVPB 3.375 g  Status:  Discontinued     3.375 g 12.5 mL/hr over 240 Minutes Intravenous Every 8 hours 11/27/14 0435 11/28/14 1357   11/27/14 0130  vancomycin (VANCOCIN) 2,000 mg in sodium chloride 0.9 % 500 mL IVPB     2,000 mg 250 mL/hr over 120 Minutes Intravenous  Once 11/27/14  0120 11/27/14 0643   11/27/14 0130  piperacillin-tazobactam (ZOSYN) IVPB 3.375 g     3.375 g 100 mL/hr over 30 Minutes Intravenous  Once 11/27/14 0120 11/27/14 0227      Assessment/Plan: Acute cholecystitis, severe COPD. Status post percutaneous drainage of gallbladder. Multiple system organ failure. CT shows catheter in good position and the decompressed gallbladder. Catheter draining clear bile. Source appears to be controlled with no other apparent septic source in the abdomen. Continued drainage, antibiotics and supportive care Stable though critically ill.  WBC is down slightly today    LOS: 5 days    Rashan Rounsaville T 12/02/2014

## 2014-12-02 NOTE — Progress Notes (Signed)
[place] Progress Note Patient Name: Allen Green DOB: 11-02-1950 MRN: [id number]   Date of Service  12/02/2014  HPI/Events of Note  Progressive shock state  eICU Interventions  NEO added to vasopressin/levophed     Intervention Category Major Interventions: Shock - evaluation and management  Asencion Noble 12/02/2014, 4:08 PM

## 2014-12-02 NOTE — Progress Notes (Signed)
VASCULAR LAB PRELIMINARY  PRELIMINARY  PRELIMINARY  PRELIMINARY  Bilateral lower extremity venous Dopplers completed.    Preliminary report:  There is no obvious evidence of DVT or SVT noted in the bilateral lower extremities.   Lanell Carpenter, RVT 12/02/2014, 12:51 PM

## 2014-12-02 NOTE — Progress Notes (Signed)
Patient ID: Allen Green, male   DOB: June 15, 1951, 64 y.o.   MRN: [id number]  [facility] Progress Note    Assessment/ Plan:   1. Septic shock secondary to gallbladder abscess/cholecystitis: On antibiotic therapy/supportive management for multi-organ dysfunction-CT scan reviewed by surgery and found to have good position of percutaneous gallbladder drain and well decompressed gallbladder. Remains critically ill  2. Acute renal failure: Secondary to septic shock and contrast-induced nephropathy-remains anuric and without any evident renal recovery. Continue CRRT on current prescription-no ultrafiltration due to continued pressor needs. If respiratory acidosis remains problematic-may increase post filter bicarbonate drip rate to help buffer. 3. Hyperkalemia: Corrected with CRRT-continue to monitor. 4. Polycythemia: Chronic and secondary to underlying COPD/chronic hypoxia. He has an outpatient follow-up with hematology. 5. Chronic obstructive lung disease: Currently on ventilator-further management per CCM, with worsening hypoxia and auto PEEP overnight-adjustments per CCM earlier. 6. Atrial flutter: restarted back on amiodarone gtt, keeping even on CRRT  Subjective:   Developed atrial flutter earlier today-no problems with hypoxemia overnight    Objective:   BP 61/38 mmHg  Pulse 129  Temp(Src) 98.1 F (36.7 C) (Core (Comment))  Resp 20  Ht 5\' 11"  (1.803 m)  Wt 122.4 kg (269 lb 13.5 oz)  BMI 37.65 kg/m2  SpO2 97%  Intake/Output Summary (Last 24 hours) at 12/02/14 1213 Last data filed at 12/02/14 1200  Gross per 24 hour  Intake 4117.03 ml  Output   4367 ml  Net -249.97 ml   Weight change: -0.5 kg (-1 lb 1.6 oz)  Physical Exam: Gen: Intubated, sedated CVS: Pulse irregular tachycardia Resp: Coarse breath sounds bilaterally, no rales Abd: Soft, obese, nontender, right upper quadrant/biliary drain Ext: 1+ edema lower extremities, 1-2+ upper extremity edema  Imaging: Ct  Abdomen Pelvis Wo Contrast  11/30/2014   CLINICAL DATA:  Abdominal distention, vomiting.  EXAM: CT ABDOMEN AND PELVIS WITHOUT CONTRAST  TECHNIQUE: Multidetector CT imaging of the abdomen and pelvis was performed following the standard protocol without IV contrast.  COMPARISON:  01/27/2015 contrast study  FINDINGS: Lower chest: Emphysematous changes noted in the lung bases. Trace bilateral pleural effusions, new since prior study. Bibasilar atelectasis. Heart is normal size. Densely calcified coronary arteries.  Hepatobiliary: Cholecystostomy tube has been placed into the gallbladder. Gallbladder is decompressed. Small amount of perihepatic ascites, new since prior study. No focal hepatic abnormality visualized on this unenhanced study.  Pancreas: No focal abnormality or ductal dilatation.  Spleen: No focal abnormality.  Normal size.  Adrenals/Urinary Tract: No focal renal or adrenal abnormality. No hydronephrosis. Urinary bladder is decompressed with Foley catheter in place.  Stomach/Bowel: NG tube present in the stomach which is unremarkable. Mildly prominent abdominal small bowel loops within the mid jejunum. Questionable mild wall thickening in these mildly prominent small bowel loops, best seen on images 66-75 of series 2. Transition to normal caliber small bowel noted on image 66. Appendix is visualized and unremarkable.  Vascular/Lymphatic: Aortic and iliac calcifications. No aneurysm. No retroperitoneal or mesenteric adenopathy.  Reproductive: No mass or other significant abnormality.  Other: Trace free fluid in the pelvis and adjacent to the liver.  Musculoskeletal: No focal bone lesion or acute bony abnormality.  IMPRESSION: Mildly prominent central small bowel loops in the lower abdomen and upper pelvis, new since prior study. There is questionable wall thickening within these loops suggesting the possibility of enteritis. This alternatively could represent early partial small bowel obstruction, but no  visible abnormality at the transition point.  Interval placement of cholecystostomy  tube with decompressed gallbladder.  New trace bilateral pleural effusions and trace free fluid in the abdomen and pelvis.  Coronary artery disease, COPD.   Electronically Signed   By: Rolm Baptise M.D.   On: 11/30/2014 12:48   Dg Chest Port 1 View  12/02/2014   CLINICAL DATA:  Acute onset of respiratory failure. Initial encounter.  EXAM: PORTABLE CHEST - 1 VIEW  COMPARISON:  Chest radiograph performed 11/30/2014  FINDINGS: The patient's endotracheal tube is seen ending just above the carina. This could be retracted 2 cm.  A right-sided dual-lumen catheter is noted ending about the mid SVC. A left IJ line is also noted ending about the mid SVC. An enteric tube is noted extending below the diaphragm.  Vascular congestion is noted. Bibasilar airspace opacities raise concern for mild interstitial edema. Pneumonia might have a similar appearance. No definite pleural effusion or pneumothorax is seen.  The cardiomediastinal silhouette is normal in size. No acute osseous abnormalities are identified.  IMPRESSION: 1. Endotracheal tube seen ending just above the carina. This could be retracted 2 cm. 2. Vascular congestion noted. Bibasilar airspace opacities raise concern for mild interstitial edema. Pneumonia might have a similar appearance.  These results were called by telephone at the time of interpretation on 12/02/2014 at 6:39 am to [place] at the [facility], who verbally acknowledged these results.   Electronically Signed   By: Garald Balding M.D.   On: 12/02/2014 06:40    Labs: BMET  Recent Labs Lab 11/28/14 2130 11/29/14 0420 11/29/14 1600 11/30/14 0500 11/30/14 1600 12/01/14 0430 12/01/14 1600  NA 135 134* 135 137 135 134* 133*  K 6.0* 5.6* 5.8* 5.5* 4.9 4.3 4.2  CL 103 102 103 103 100 96 93*  CO2 24 23 25 25 28 31  33*  GLUCOSE 98 100* 112* 151* 111* 151* 138*  BUN 36* 32* 27* 25* 26* 25* 24*   CREATININE 4.07* 3.91* 3.35* 3.09* 3.04* 3.01* 2.97*  CALCIUM 7.6* 7.5* 7.6* 8.0* 7.5* 7.5* 7.7*  PHOS 5.9*  --  5.3* 4.8* 3.6 3.3 2.7   CBC  Recent Labs Lab 11/15/2014 2154 11/27/14 0320  11/29/14 0420 11/30/14 0500 12/01/14 0430 12/02/14 0340  WBC 22.3* 23.0*  < > 32.9* 36.0* 23.7* 20.3*  NEUTROABS 17.8* 20.0*  --  30.9*  --   --   --   HGB 15.2 14.6  < > 13.0 12.4* 10.9* 11.2*  HCT 44.9 44.5  < > 41.2 39.4 34.3* 33.4*  MCV 94.3 95.3  < > 97.9 98.0 95.0 92.5  PLT 398 365  < > 238 215 164 166  < > = values in this interval not displayed.  Medications:    . antiseptic oral rinse  7 mL Mouth Rinse QID  . chlorhexidine  15 mL Mouth Rinse BID  . ciprofloxacin  400 mg Intravenous Q12H  . feeding supplement (PRO-STAT SUGAR FREE 64)  30 mL Per Tube TID  . feeding supplement (VITAL HIGH PROTEIN)  1,000 mL Per Tube Q24H  . fentaNYL (SUBLIMAZE) injection  50 mcg Intravenous Once  . heparin subcutaneous  5,000 Units Subcutaneous 3 times per day  . ipratropium-albuterol  3 mL Nebulization Q6H  . meropenem (MERREM) IV  1 g Intravenous Q12H  . pantoprazole (PROTONIX) IV  40 mg Intravenous Q24H  . QUEtiapine  100 mg Oral BID  . sodium chloride  3 mL Intravenous Q12H   Elmarie Shiley, MD 12/02/2014, 12:13 PM

## 2014-12-02 NOTE — Progress Notes (Signed)
Spoke with MD regarding low PO2 on ABG drawn from Right radial A-line.  Per MD stick Pt on Left side to compare results.  RT to monitor and assess as needed.

## 2014-12-02 DEATH — deceased

## 2014-12-03 ENCOUNTER — Inpatient Hospital Stay (HOSPITAL_COMMUNITY): Payer: Medicare Other

## 2014-12-03 LAB — BLOOD GAS, ARTERIAL
Acid-Base Excess: 7.2 mmol/L — ABNORMAL HIGH (ref 0.0–2.0)
Bicarbonate: 33.5 mEq/L — ABNORMAL HIGH (ref 20.0–24.0)
Drawn by: 308601
FIO2: 0.8 %
LHR: 20 {breaths}/min
O2 Saturation: 95.9 %
PCO2 ART: 58.6 mmHg — AB (ref 35.0–45.0)
PEEP: 10 cmH2O
Patient temperature: 37.1
TCO2: 30.7 mmol/L (ref 0–100)
VT: 500 mL
pH, Arterial: 7.375 (ref 7.350–7.450)
pO2, Arterial: 89.2 mmHg (ref 80.0–100.0)

## 2014-12-03 LAB — GLUCOSE, CAPILLARY
GLUCOSE-CAPILLARY: 177 mg/dL — AB (ref 70–99)
Glucose-Capillary: 161 mg/dL — ABNORMAL HIGH (ref 70–99)
Glucose-Capillary: 164 mg/dL — ABNORMAL HIGH (ref 70–99)
Glucose-Capillary: 164 mg/dL — ABNORMAL HIGH (ref 70–99)

## 2014-12-03 LAB — RENAL FUNCTION PANEL
ALBUMIN: 2.1 g/dL — AB (ref 3.5–5.0)
Anion gap: 9 (ref 5–15)
BUN: 26 mg/dL — ABNORMAL HIGH (ref 6–20)
CHLORIDE: 87 mmol/L — AB (ref 101–111)
CO2: 31 mmol/L (ref 22–32)
CREATININE: 3.05 mg/dL — AB (ref 0.61–1.24)
Calcium: 7.8 mg/dL — ABNORMAL LOW (ref 8.9–10.3)
GFR calc non Af Amer: 20 mL/min — ABNORMAL LOW (ref >60)
GFR, EST AFRICAN AMERICAN: 24 mL/min — AB (ref >60)
Glucose, Bld: 153 mg/dL — ABNORMAL HIGH (ref 70–99)
PHOSPHORUS: 4.5 mg/dL (ref 2.5–4.6)
Potassium: 4.4 mmol/L (ref 3.5–5.1)
SODIUM: 127 mmol/L — AB (ref 135–145)

## 2014-12-03 LAB — CBC
HEMATOCRIT: 32.1 % — AB (ref 39.0–52.0)
HEMOGLOBIN: 10.8 g/dL — AB (ref 13.0–17.0)
MCH: 30.3 pg (ref 26.0–34.0)
MCHC: 33.6 g/dL (ref 30.0–36.0)
MCV: 90.2 fL (ref 78.0–100.0)
PLATELETS: 132 10*3/uL — AB (ref 150–400)
RBC: 3.56 MIL/uL — ABNORMAL LOW (ref 4.22–5.81)
RDW: 15.5 % (ref 11.5–15.5)
WBC: 23 10*3/uL — ABNORMAL HIGH (ref 4.0–10.5)

## 2014-12-03 LAB — APTT
APTT: 40 s — AB (ref 24–37)
APTT: 48 s — AB (ref 24–37)
APTT: 58 s — AB (ref 24–37)
aPTT: 58 seconds — ABNORMAL HIGH (ref 24–37)
aPTT: 57 seconds — ABNORMAL HIGH (ref 24–37)
aPTT: 50 seconds — ABNORMAL HIGH (ref 24–37)

## 2014-12-03 LAB — MAGNESIUM: Magnesium: 2.5 mg/dL — ABNORMAL HIGH (ref 1.7–2.4)

## 2014-12-03 MED ORDER — HYDROCORTISONE NA SUCCINATE PF 100 MG IJ SOLR
50.0000 mg | Freq: Four times a day (QID) | INTRAMUSCULAR | Status: DC
  Administered 2014-12-03 – 2014-12-07 (×16): 50 mg via INTRAVENOUS
  Filled 2014-12-03 (×16): qty 2

## 2014-12-03 MED ORDER — PHENYLEPHRINE HCL 10 MG/ML IJ SOLN
30.0000 ug/min | INTRAVENOUS | Status: DC
  Administered 2014-12-03 (×3): 200 ug/min via INTRAVENOUS
  Administered 2014-12-03: 50 ug/min via INTRAVENOUS
  Administered 2014-12-04: 30 ug/min via INTRAVENOUS
  Filled 2014-12-03 (×5): qty 4

## 2014-12-03 MED ORDER — AMIODARONE IV BOLUS ONLY 150 MG/100ML
150.0000 mg | Freq: Once | INTRAVENOUS | Status: AC
  Administered 2014-12-03: 150 mg via INTRAVENOUS
  Filled 2014-12-03: qty 100

## 2014-12-03 NOTE — Progress Notes (Signed)
Subjective: Sedated on Vent, multiple pressors, AF with RVR.  Remains gravely ill.  No urine output yesterday, very little noted from his Perc drain either.  Objective: Vital signs in last 24 hours: Temp:  [95.4 F (35.2 C)-100.4 F (38 C)] 99.1 F (37.3 C) (05/02 0600) Pulse Rate:  [122-145] 145 (05/02 0600) Resp:  [13-29] 20 (05/02 0600) BP: (61-114)/(38-62) 112/52 mmHg (05/02 0345) SpO2:  [95 %-100 %] 96 % (05/02 0600) FiO2 (%):  [80 %-90 %] 80 % (05/02 0345) Weight:  [125.1 kg (275 lb 12.7 oz)] 125.1 kg (275 lb 12.7 oz) (05/02 0358) Last BM Date: 11/28/14 IV 5280 Urine:  0 NG 1350 CRRT:  3291 Tachycardia up to 145 TM 99.3 BP down to 97 on 3 pressors WBC still up 23K Intake/Output from previous day: 05/01 0701 - 05/02 0700 In: 6375.8 [I.V.:5280.8; NG/GT:465; IV Piggyback:400] Out: 4661 [Emesis/NG output:1350; Drains:20] Intake/Output this shift: Total I/O In: 346.5 [I.V.:336.5; Other:10] Out: 330 [Urine:4; Emesis/NG output:30; Drains:5; Other:291]  General appearance: Sedated on the Vent GI: unresponsive, no BS, very little from the drain.  Lab Results:   Recent Labs  12/02/14 0340 12/03/14 0330  WBC 20.3* 23.0*  HGB 11.2* 10.8*  HCT 33.4* 32.1*  PLT 166 132*    BMET  Recent Labs  12/01/14 1600 12/02/14 1540  NA 133* 134*  K 4.2 4.2  CL 93* 92*  CO2 33* 33*  GLUCOSE 138* 143*  BUN 24* 28*  CREATININE 2.97* 2.71*  CALCIUM 7.7* 7.8*   PT/INR No results for input(s): LABPROT, INR in the last 72 hours.   Recent Labs Lab 11/23/2014 2154 11/27/14 0320 11/28/14 2130 11/29/14 1600 11/30/14 1600 12/01/14 1600 12/02/14 1540  AST 18 18  --   --   --   --   --   ALT 21 20  --   --   --   --   --   ALKPHOS 72 70  --   --   --   --   --   BILITOT 0.4 0.8  --   --   --   --   --   PROT 7.4 6.8  --   --   --   --   --   ALBUMIN 3.7 3.3* 2.6* 2.5* 2.3* 2.3* 2.4*     Lipase     Component Value Date/Time   LIPASE 20 11/19/2014 0412      Studies/Results: Dg Chest Port 1 View  12/03/2014   CLINICAL DATA:  Acute respiratory failure  EXAM: PORTABLE CHEST - 1 VIEW  COMPARISON:  12/02/2014  FINDINGS: The endotracheal tube extends to just below the clavicular heads. The left jugular central line extends into the upper SVC. The right jugular line extends into the upper SVC. The nasogastric tube extends into the stomach and off the inferior edge of the image.  Airspace opacities persist in both bases without significant interval change.  IMPRESSION: Support equipment appears satisfactorily positioned.  No significant interval change in the bilateral airspace opacities.   Electronically Signed   By: Andreas Newport M.D.   On: 12/03/2014 06:02   Dg Chest Port 1 View  12/02/2014   CLINICAL DATA:  Acute onset of respiratory failure. Initial encounter.  EXAM: PORTABLE CHEST - 1 VIEW  COMPARISON:  Chest radiograph performed 11/30/2014  FINDINGS: The patient's endotracheal tube is seen ending just above the carina. This could be retracted 2 cm.  A right-sided dual-lumen catheter is noted ending about the mid  SVC. A left IJ line is also noted ending about the mid SVC. An enteric tube is noted extending below the diaphragm.  Vascular congestion is noted. Bibasilar airspace opacities raise concern for mild interstitial edema. Pneumonia might have a similar appearance. No definite pleural effusion or pneumothorax is seen.  The cardiomediastinal silhouette is normal in size. No acute osseous abnormalities are identified.  IMPRESSION: 1. Endotracheal tube seen ending just above the carina. This could be retracted 2 cm. 2. Vascular congestion noted. Bibasilar airspace opacities raise concern for mild interstitial edema. Pneumonia might have a similar appearance.  These results were called by telephone at the time of interpretation on 12/02/2014 at 6:39 am to [place] at the [facility], who verbally acknowledged these results.   Electronically Signed    By: Garald Balding M.D.   On: 12/02/2014 06:40    Medications: . antiseptic oral rinse  7 mL Mouth Rinse QID  . chlorhexidine  15 mL Mouth Rinse BID  . feeding supplement (PRO-STAT SUGAR FREE 64)  30 mL Per Tube TID  . fentaNYL (SUBLIMAZE) injection  50 mcg Intravenous Once  . heparin subcutaneous  5,000 Units Subcutaneous 3 times per day  . ipratropium-albuterol  3 mL Nebulization Q6H  . meropenem (MERREM) IV  1 g Intravenous Q12H  . pantoprazole (PROTONIX) IV  40 mg Intravenous Q24H  . QUEtiapine  100 mg Oral BID  . sodium chloride  3 mL Intravenous Q12H   . sodium chloride 1,000 mL (12/01/14 0259)  . sodium chloride 10 mL/hr at 12/01/14 1400  . amiodarone 30 mg/hr (12/02/14 2224)  . fentaNYL infusion INTRAVENOUS 250 mcg/hr (12/02/14 2146)  . heparin 10,000 units/ 20 mL infusion syringe 1,700 Units/hr (12/03/14 0800)  . midazolam (VERSED) infusion 1 mg/hr (12/02/14 0841)  . norepinephrine (LEVOPHED) Adult infusion 40 mcg/min (12/03/14 0221)  . phenylephrine (NEO-SYNEPHRINE) Adult infusion 150 mcg/min (12/03/14 0730)  . dialysis replacement fluid (prismasate) 400 mL/hr at 12/03/14 0611  . dialysate (PRISMASATE) 1,500 mL/hr at 12/03/14 0457  . sodium bicarbonate (isotonic) 1000 mL infusion 200 mL/hr at 12/03/14 0821  . vasopressin (PITRESSIN) infusion - *FOR SHOCK* 0.03 Units/min (12/02/14 1018)    Assessment/Plan Recurrent Cholecystitis with sepsis, possible biloma/infected tract from prior drain Sepsis with shock (Gram Positives and gram negative organisms from gram stain.) Acute respiratory failure on full ventilator support Severe COPD Polycythemia from hypoxia Acute renal failure Hepatitis B hx. Pneumonia Bilateral knee arthroplasties with limited mobility Morbid obesity/Body mass index is 37.38 4 days of  Zosyn stopped 11/30/14/Vancomycin stopped on 11/28/14/day 6Cipro/day 4 Meropenem DVT: SCD/heparin        Plan:  Ongoing treatment for sepsis, shock, renal failure,  respiratory failure, AF with RVR.   Repeat CT scan last week 4/29 showed no abscess or sites of new or poorly drained abscess.   Defer to medical management.  LOS: 6 days    Allen Green 12/03/2014

## 2014-12-03 NOTE — Progress Notes (Signed)
Patient ID: Allen Green, male   DOB: [birth year], 64 y.o.   MRN: [id number]  [facility] Progress Note    Assessment/ Plan:   1. Acute renal failure: Secondary to septic shock and contrast-induced nephropathy-remains anuric and without any evident renal recovery. Continue CRRT on current prescription-no ultrafiltration due to continued pressor needs. 2. Septic shock secondary to gallbladder abscess/cholecystitis: On antibiotic therapy/supportive management for multi-organ dysfunction-CT scan reviewed by surgery and found to have good position of percutaneous gallbladder drain and well decompressed gallbladder. Remains critically ill 3. Polycythemia: Chronic and secondary to underlying COPD/chronic hypoxia. He has an outpatient follow-up with hematology. 4. Chronic obstructive lung disease: Currently on ventilator-further management per CCM, with worsening hypoxia and auto PEEP overnight-adjustments per CCM earlier. 5. Atrial flutter: restarted back on amiodarone gtt, keeping even on CRRT  Subjective:   No new problems   Objective:   BP 112/52 mmHg  Pulse 138  Temp(Src) 95.7 F (35.4 C) (Core (Comment))  Resp 16  Ht 5\' 11"  (1.803 m)  Wt 125.1 kg (275 lb 12.7 oz)  BMI 38.48 kg/m2  SpO2 96%  Intake/Output Summary (Last 24 hours) at 12/03/14 1641 Last data filed at 12/03/14 1600  Gross per 24 hour  Intake 7470.33 ml  Output   5206 ml  Net 2264.33 ml   Weight change: 2.7 kg (5 lb 15.2 oz)  Physical Exam: Gen: Intubated, sedated CVS: Pulse irregular tachycardia Resp: Coarse breath sounds bilaterally, no rales Abd: Soft, obese, nontender, right upper quadrant/biliary drain Ext: 1+ edema lower extremities, 1-2+ upper extremity edema   Labs: BMET  Recent Labs Lab 11/28/14 2130 11/29/14 0420 11/29/14 1600 11/30/14 0500 11/30/14 1600 12/01/14 0430 12/01/14 1600 12/02/14 1540  NA 135 134* 135 137 135 134* 133* 134*  K 6.0* 5.6* 5.8* 5.5* 4.9 4.3 4.2 4.2  CL  103 102 103 103 100 96 93* 92*  CO2 24 23 25 25 28 31  33* 33*  GLUCOSE 98 100* 112* 151* 111* 151* 138* 143*  BUN 36* 32* 27* 25* 26* 25* 24* 28*  CREATININE 4.07* 3.91* 3.35* 3.09* 3.04* 3.01* 2.97* 2.71*  CALCIUM 7.6* 7.5* 7.6* 8.0* 7.5* 7.5* 7.7* 7.8*  PHOS 5.9*  --  5.3* 4.8* 3.6 3.3 2.7 3.1   CBC  Recent Labs Lab 11/05/2014 2154 11/27/14 0320  11/29/14 0420 11/30/14 0500 12/01/14 0430 12/02/14 0340 12/03/14 0330  WBC 22.3* 23.0*  < > 32.9* 36.0* 23.7* 20.3* 23.0*  NEUTROABS 17.8* 20.0*  --  30.9*  --   --   --   --   HGB 15.2 14.6  < > 13.0 12.4* 10.9* 11.2* 10.8*  HCT 44.9 44.5  < > 41.2 39.4 34.3* 33.4* 32.1*  MCV 94.3 95.3  < > 97.9 98.0 95.0 92.5 90.2  PLT 398 365  < > 238 215 164 166 132*  < > = values in this interval not displayed.  Medications:    . antiseptic oral rinse  7 mL Mouth Rinse QID  . chlorhexidine  15 mL Mouth Rinse BID  . feeding supplement (PRO-STAT SUGAR FREE 64)  30 mL Per Tube TID  . fentaNYL (SUBLIMAZE) injection  50 mcg Intravenous Once  . heparin subcutaneous  5,000 Units Subcutaneous 3 times per day  . hydrocortisone sod succinate (SOLU-CORTEF) inj  50 mg Intravenous Q6H  . ipratropium-albuterol  3 mL Nebulization Q6H  . meropenem (MERREM) IV  1 g Intravenous Q12H  . pantoprazole (PROTONIX) IV  40 mg Intravenous Q24H  .  QUEtiapine  100 mg Oral BID  . sodium chloride  3 mL Intravenous Q12H   Kelly Splinter MD (pgr) [phone]    [phone] 12/03/2014, 4:43 PM

## 2014-12-03 NOTE — Progress Notes (Signed)
PULMONARY / CRITICAL CARE MEDICINE   Name: Allen Green MRN: [id number] DOB: [birth year]    ADMISSION DATE:  11/08/2014 CONSULTATION DATE: 4/26  REFERRING MD :  [facility]  CHIEF COMPLAINT:  SOB  INITIAL PRESENTATION: 64 y/o male admitted on 4/26 with septic shock and acute respiratory failure due to cholecystitis and an abscess associated with a recently dislodged gallbladder drain.  STUDIES:  4/26 CT Abdomen> acute cholecystitis and gallbladder tract abscess 4/26 ECHO> poor study, can't comment on LV or RV size/function 4/29 CT abdomen >> Mildly prominent central small bowel loops in the lower abdomen and upper pelvis , decompressed gallbladder. 5/1 ven duplex neg bLE  SIGNIFICANT EVENTS: 4/26 intubated, Gallbladder drain placed by IR 4/29 multiple amio bolus for RVR 4/30 worsening hypoxia   SUBJECTIVE:   Agitated better, on lower dose fent gtt remains on amio gtt febrile, On CVVH Remains on 80%/ +10   VITAL SIGNS: Temp:  [95.4 F (35.2 C)-100.4 F (38 C)] 99 F (37.2 C) (05/02 0900) Pulse Rate:  [122-145] 125 (05/02 0900) Resp:  [13-29] 19 (05/02 0900) BP: (61-114)/(38-62) 112/52 mmHg (05/02 0345) SpO2:  [95 %-100 %] 98 % (05/02 0900) FiO2 (%):  [80 %-90 %] 80 % (05/02 0900) Weight:  [275 lb 12.7 oz (125.1 kg)] 275 lb 12.7 oz (125.1 kg) (05/02 0358) HEMODYNAMICS: CVP:  [6 mmHg-9 mmHg] 9 mmHg VENTILATOR SETTINGS: Vent Mode:  [-] PRVC FiO2 (%):  [80 %-90 %] 80 % Set Rate:  [20 bmp] 20 bmp Vt Set:  [500 mL] 500 mL PEEP:  [10 cmH20] 10 cmH20 Plateau Pressure:  [14 cmH20-24 cmH20] 24 cmH20 INTAKE / OUTPUT:  Intake/Output Summary (Last 24 hours) at 12/03/14 0958 Last data filed at 12/03/14 0900  Gross per 24 hour  Intake 6510.48 ml  Output   4877 ml  Net 1633.48 ml    PHYSICAL EXAMINATION: General:  On vent, agitated  HENT: NCAT, ETT in place, no neck PULM: CTA B, autopeep + CV: Tachy, regular, no mgr GI: BS+, soft, drain in place, large Derm: improved redness,  clear bile GB drain Neuro: sedated or agitated  LABS:  CBC  Recent Labs Lab 12/01/14 0430 12/02/14 0340 12/03/14 0330  WBC 23.7* 20.3* 23.0*  HGB 10.9* 11.2* 10.8*  HCT 34.3* 33.4* 32.1*  PLT 164 166 132*   Coag's  Recent Labs Lab 11/27/14 0525 11/28/14 0310  12/02/14 2330 12/03/14 0330 12/03/14 0810  APTT 30 38*  < > 58* 58* 57*  INR 1.08 1.36  --   --   --   --   < > = values in this interval not displayed. BMET  Recent Labs Lab 12/01/14 0430 12/01/14 1600 12/02/14 1540  NA 134* 133* 134*  K 4.3 4.2 4.2  CL 96 93* 92*  CO2 31 33* 33*  BUN 25* 24* 28*  CREATININE 3.01* 2.97* 2.71*  GLUCOSE 151* 138* 143*   Electrolytes  Recent Labs Lab 12/01/14 0430 12/01/14 1600 12/02/14 0340 12/02/14 1540 12/03/14 0330  CALCIUM 7.5* 7.7*  --  7.8*  --   MG 2.1  --  2.5*  --  2.5*  PHOS 3.3 2.7  --  3.1  --    Sepsis Markers  Recent Labs Lab 11/27/14 0320 11/28/14 0310 11/29/14 0420 11/30/14 1010 12/01/14 0430  LATICACIDVEN 1.1 1.9  --  2.1*  --   PROCALCITON 0.14  --  56.94  --  26.51   ABG  Recent Labs Lab 12/02/14 0610 12/02/14 0748 12/03/14 0522  PHART 7.345* 7.339* 7.375  PCO2ART 60.5* 66.7* 58.6*  PO2ART 86.9 77.7* 89.2   Liver Enzymes  Recent Labs Lab 11/25/2014 2154 11/27/14 0320  11/30/14 1600 12/01/14 1600 12/02/14 1540  AST 18 18  --   --   --   --   ALT 21 20  --   --   --   --   ALKPHOS 72 70  --   --   --   --   BILITOT 0.4 0.8  --   --   --   --   ALBUMIN 3.7 3.3*  < > 2.3* 2.3* 2.4*  < > = values in this interval not displayed. Cardiac Enzymes  Recent Labs Lab 11/27/14 1250 11/27/14 2048 11/28/14 1010  TROPONINI <0.03 <0.03 <0.03   Glucose  Recent Labs Lab 12/02/14 1227 12/02/14 1652 12/02/14 2024 12/02/14 2341 12/03/14 0350 12/03/14 0724  GLUCAP 143* 149* 159* 152* 161* 164*    Imaging    ASSESSMENT / PLAN:  PULMONARY OETT 4/26(PCCM)>> A: Acute respiratory failure with hypercapnea> improved  4/27, passing SBT Severe COPD, but doing surprisingly well on 4/27 AM, so an elective Ex-lap as outpatient for cholecystectomy not unreasonable Tobacco abuse BLL HCAP 5/1 Worse hypoxia due to ? Vent asynchrony, doubt PE P:   Hold wean as overall clinical picture worse  Vent bundle BD's May need trach if he survives current insult  Keep RR to 20 to avoid  autopeep Better sedation   CARDIOVASCULAR CVL 4/26 L IJ CVL>> A:  Septic shock > worse 4/29, echo not helpful,? Cardiogenic component, more septic at this point -Repeat troponin>>neg P:  Levophed for MAP > 65 as needed ct vaso + neo gtt ct amio gtt  RENAL Lab Results  Component Value Date   CREATININE 2.71* 12/02/2014   CREATININE 2.97* 12/01/2014   CREATININE 3.01* 12/01/2014    Recent Labs Lab 12/01/14 0430 12/01/14 1600 12/02/14 1540  K 4.3 4.2 4.2      A: Oliguric AKI -on CVVH since 4/27 P:   Monitor BMET and UOP Renal consult ct CVVH -keep even while on pressors  GASTROINTESTINAL A:   Acute Cholecystis > with peri-gallbladder abscess, s/p drain 4/26 per IR Recent IR drain 4/3->4/12  Ileus P:   Monitor drain output Hold Tfsx 24h, restart 5/3 reglan 5 q 8h   HEMATOLOGIC A:   Chronic polycythemia  P:  Follow H/H Monitor for bleeding  INFECTIOUS A:   Septic shock> worse 4/29 likely all related to draining abscess on 4/26, don't suspect secondary source or inappropriate antibiotic coverage at this point, cipro added 4/27 4/29 recheck ct abd P:   BCx2  4/26>>bacteroides UC  4/26>> Sputum 4/26>>nl flora Wound culture 4/26 > bacteroides 4/26 c dif>>neg Abx:  4/26 vanc>> 4/30 4/26 zoysn>>4/29 4/27 cipro>> 5/1 4/29 meropenem >>  Rechk resp cx  ENDOCRINE CBG (last 3)   Recent Labs  12/02/14 2341 12/03/14 0350 12/03/14 0724  GLUCAP 152* 161* 164*     A:   No acute issue P:   SSI if needed  NEUROLOGIC A:  Intact prior to intubation Severe agitation/ asynchrony  P:    RASS goal: -1 Continue fentanyl per PAD protocol ct versed gtt   FAMILY  - Updates: updated at length 5/2 AM bedside  - Inter-disciplinary family meet or Palliative Care meeting due by: 5/3  Summary - remains in  shock with autoPEEP & worsening hypoxia   The patient is critically ill with multiple organ systems  failure and requires high complexity decision making for assessment and support, frequent evaluation and titration of therapies, application of advanced monitoring technologies and extensive interpretation of multiple databases. Critical Care Time devoted to patient care services described in this note independent of APP time is 35 minutes.   Kara Mead MD. [facility] Flood. [facility] Pager [phone] If no response call [phone]     12/03/2014, 9:58 AM

## 2014-12-03 NOTE — Care Management Note (Signed)
Case Management Note  Patient Details  Name: Allen Green MRN: [id number] Date of Birth: 04-Jan-1951  Subjective/Objective:                    Action/Plan:   Expected Discharge Date:   (unknown)               Expected Discharge Plan:     In-House Referral:     Discharge planning Services     Post Acute Care Choice:    Choice offered to:     DME Arranged:    DME Agency:     HH Arranged:    [place] Agency:     Status of Service:     Medicare Important Message Given:    Date Medicare IM Given:    Medicare IM give by:    Date Additional Medicare IM Given:    Additional Medicare Important Message give by:     If discussed at [place] of Stay Meetings, dates discussed:    Additional Comments:  Leeroy Cha, RN 12/03/2014, 9:43 AM

## 2014-12-03 NOTE — Progress Notes (Addendum)
High pressure alarms on CRRT machine noted. Blood recircululated from 0940 to 1005 in order to stabilize HD cath and replace dressing.

## 2014-12-03 NOTE — Progress Notes (Signed)
CRRT stopped to change filter at 1215. Resumed at 1320.

## 2014-12-03 NOTE — Progress Notes (Signed)
ANTIBIOTIC CONSULT NOTE - FOLLOW UP  Pharmacy Consult for Meropenem Indication: Intra-abdominal infection, Gram negative bacteremia  No Known Allergies  Patient Measurements: Height: 5\' 11"  (180.3 cm) Weight: 275 lb 12.7 oz (125.1 kg) IBW/kg (Calculated) : 75.3  Vital Signs: Temp: 97.5 F (36.4 C) (05/02 1045) BP: 112/52 mmHg (05/02 0345) Pulse Rate: 129 (05/02 1045) Intake/Output from previous day: 05/01 0701 - 05/02 0700 In: 6375.8 [I.V.:5280.8; NG/GT:465; IV Piggyback:400] Out: 4661 [Emesis/NG output:1350; Drains:20] Intake/Output from this shift: Total I/O In: 1980.3 [I.V.:1920.3; Other:60] Out: 3875 [Urine:4; Emesis/NG output:90; Drains:10; Other:1252]  Labs:  Recent Labs  12/01/14 0430 12/01/14 1600 12/02/14 0340 12/02/14 1540 12/03/14 0330  WBC 23.7*  --  20.3*  --  23.0*  HGB 10.9*  --  11.2*  --  10.8*  PLT 164  --  166  --  132*  CREATININE 3.01* 2.97*  --  2.71*  --    Estimated Creatinine Clearance: 37.6 mL/min (by C-G formula based on Cr of 2.71).  Recent Labs  12/01/14 1011  VANCOTROUGH 23.4*     Microbiology: Recent Results (from the past 720 hour(s))  Blood culture (routine x 2)     Status: None   Collection Time: 11/27/14  1:45 AM  Result Value Ref Range Status   Specimen Description BLOOD L FA  Final   Special Requests BOTTLES DRAWN AEROBIC AND ANAEROBIC 5CC  Final   Culture   Final    BACTEROIDES THETAIOTAOMICRON Note: BETA LACTAMASE POSITIVE Note: Gram Stain Report Called to,Read Back By and Verified With: ALISON RN ON ICU AT 1651 [id number] BY [facility]STC Performed at [facility]    Report Status 11/30/2014 FINAL  Final  Blood culture (routine x 2)     Status: None   Collection Time: 11/27/14  1:50 AM  Result Value Ref Range Status   Specimen Description BLOOD LHAND  Final   Special Requests BOTTLES DRAWN AEROBIC AND ANAEROBIC 5CC  Final   Culture   Final    BACTEROIDES THETAIOTAOMICRON Note: BETA LACTAMASE POSITIVE Note:  Gram Stain Report Called to,Read Back By and Verified With: Charmayne Sheer @ 8841 ON [id number] BY [facility] Performed at [facility]    Report Status 11/30/2014 FINAL  Final  MRSA PCR Screening     Status: None   Collection Time: 11/27/14  9:25 AM  Result Value Ref Range Status   MRSA by PCR NEGATIVE NEGATIVE Final    Comment:        The GeneXpert MRSA Assay (FDA approved for NASAL specimens only), is one component of a comprehensive MRSA colonization surveillance program. It is not intended to diagnose MRSA infection nor to guide or monitor treatment for MRSA infections.   Culture, respiratory (NON-Expectorated)     Status: None   Collection Time: 11/27/14 10:56 AM  Result Value Ref Range Status   Specimen Description TRACHEAL ASPIRATE  Final   Special Requests Normal  Final   Gram Stain   Final    FEW WBC PRESENT,BOTH PMN AND MONONUCLEAR RARE SQUAMOUS EPITHELIAL CELLS PRESENT RARE GRAM POSITIVE COCCI IN PAIRS Performed at [facility]    Culture   Final    Non-Pathogenic Oropharyngeal-type Flora Isolated. Performed at [facility]    Report Status 11/29/2014 FINAL  Final  Body fluid culture     Status: None   Collection Time: 11/27/14  5:06 PM  Result Value Ref Range Status   Specimen Description GALL BLADDER  Final   Special Requests NONE  Final   Gram Stain   Final    RARE WBC PRESENT,BOTH PMN AND MONONUCLEAR FEW GRAM NEGATIVE RODS RARE GRAM POSITIVE COCCI IN PAIRS Gram Stain Report Called to,Read Back By and Verified With: Gram Stain Report Called to,Read Back By and Verified With: Charmayne Sheer RN 11/28/14 8:35AM BY [place] Performed at [facility]    Culture   Final    MULTIPLE ORGANISMS PRESENT, NONE PREDOMINANT Note: NO STAPHYLOCOCCUS AUREUS ISOLATED NO GROUP A STREP (S.PYOGENES) ISOLATED Performed at [facility]    Report Status 11/30/2014 FINAL  Final  Clostridium Difficile by PCR     Status: None   Collection Time:  11/27/14  8:54 PM  Result Value Ref Range Status   C difficile by pcr NEGATIVE NEGATIVE Final    Anti-infectives    Start     Dose/Rate Route Frequency Ordered Stop   11/30/14 1200  meropenem (MERREM) 1 g in sodium chloride 0.9 % 100 mL IVPB     1 g 200 mL/hr over 30 Minutes Intravenous Every 12 hours 11/30/14 1124     11/30/14 0930  vancomycin (VANCOCIN) 1,250 mg in sodium chloride 0.9 % 250 mL IVPB     1,250 mg 166.7 mL/hr over 90 Minutes Intravenous  Once 11/30/14 0829 11/30/14 1100   11/28/14 2000  piperacillin-tazobactam (ZOSYN) IVPB 2.25 g  Status:  Discontinued     2.25 g 100 mL/hr over 30 Minutes Intravenous Every 8 hours 11/28/14 1357 11/28/14 1853   11/28/14 2000  piperacillin-tazobactam (ZOSYN) IVPB 3.375 g  Status:  Discontinued     3.375 g 100 mL/hr over 30 Minutes Intravenous Every 6 hours 11/28/14 1854 11/30/14 1101   11/28/14 1930  ciprofloxacin (CIPRO) IVPB 400 mg  Status:  Discontinued     400 mg 200 mL/hr over 60 Minutes Intravenous Every 12 hours 11/28/14 1851 12/02/14 1543   11/27/14 1700  vancomycin (VANCOCIN) IVPB 750 mg/150 ml premix  Status:  Discontinued     750 mg 150 mL/hr over 60 Minutes Intravenous Every 12 hours 11/27/14 0640 11/28/14 1141   11/27/14 1000  piperacillin-tazobactam (ZOSYN) IVPB 3.375 g  Status:  Discontinued     3.375 g 12.5 mL/hr over 240 Minutes Intravenous Every 8 hours 11/27/14 0435 11/28/14 1357   11/27/14 0130  vancomycin (VANCOCIN) 2,000 mg in sodium chloride 0.9 % 500 mL IVPB     2,000 mg 250 mL/hr over 120 Minutes Intravenous  Once 11/27/14 0120 11/27/14 0643   11/27/14 0130  piperacillin-tazobactam (ZOSYN) IVPB 3.375 g     3.375 g 100 mL/hr over 30 Minutes Intravenous  Once 11/27/14 0120 11/27/14 0227      Assessment: 64 y/o male admitted on 4/26 with septic shock and acute respiratory failure due to cholecystitis and an abscess associated with a recently dislodged gallbladder drain.  Patient was placed on CRRT 4/27 and  Pharmacy has been consulted to manage antibiotic dosing since then.  4/26 >> Vanc >> 4/30  4/26 >> Zosyn >> 4/29  4/27 >> Cipro>> 5/1  4/29 >> Meropenem >>  4/26 blood x2: Bacteroides thetaiotaomicron BL+  4/25 trach asp: normal flora-final  4/26 gall bladder fluid: polymicrobial-final  Today, 12/03/2014, Day #7 antibiotics (Day #4 Meropenem) Tmax: 99.5 WBC: remain elevated at 23k, stress-dose hydrocortisone started today Renal: CRRT continues, remains oliguric, SCr slightly improved 2.71 last PM  Goal of Therapy:  Eradication of infection Antibiotic doses appropriate for CRRT  Plan:   Continue meropenem 1g IV q12h  Continue to  follow renal function, tolerance of CRRT  Follow up plan for duration of therapy  Peggyann Juba, PharmD, BCPS Pager: [phone] 12/03/2014,1:55 PM

## 2014-12-03 NOTE — Progress Notes (Signed)
CARE MANAGEMENT NOTE 12/03/2014  Patient:  Allen Green, Allen Green   Account Number:  [id number]  Date Initiated:  11/30/2014  Documentation initiated by:  Isley Zinni  Subjective/Objective Assessment:   sedated on vent with sepsis     Action/Plan:   tbd   Anticipated DC Date:  12/06/2014   Anticipated DC Plan:    In-house referral  Clinical Social Worker      DC Planning Services  CM consult      [facility] Choice  NA   Choice offered to / List presented to:  NA      DME agency  NA     [place] arranged  NA      [place] agency  NA   Status of service:  In process, will continue to follow Medicare Important Message given?   (If response is "NO", the following Medicare IM given date fields will be blank) Date Medicare IM given:   Medicare IM given by:   Date Additional Medicare IM given:   Additional Medicare IM given by:    Discharge Disposition:    Per UR Regulation:  Reviewed for med. necessity/level of care/duration of stay  If discussed at [place] of Stay Meetings, dates discussed:    Comments:  Dec 03, 2014/Shakesha Soltau L. Rosana Hoes, RN, BSN, CCM. Case Management [place] [phone] No discharge needs present of time of review.   November 29, 2014/Ashvik Grundman L. Rosana Hoes, RN, BSN, CCM. Case Management [place] [phone] No discharge needs present of time of review.

## 2014-12-04 ENCOUNTER — Inpatient Hospital Stay (HOSPITAL_COMMUNITY): Payer: Medicare Other

## 2014-12-04 LAB — APTT
APTT: 68 s — AB (ref 24–37)
APTT: 84 s — AB (ref 24–37)
APTT: 87 s — AB (ref 24–37)
aPTT: 73 seconds — ABNORMAL HIGH (ref 24–37)
aPTT: 89 seconds — ABNORMAL HIGH (ref 24–37)
aPTT: 72 seconds — ABNORMAL HIGH (ref 24–37)

## 2014-12-04 LAB — GLUCOSE, CAPILLARY
GLUCOSE-CAPILLARY: 155 mg/dL — AB (ref 70–99)
GLUCOSE-CAPILLARY: 155 mg/dL — AB (ref 70–99)
GLUCOSE-CAPILLARY: 172 mg/dL — AB (ref 70–99)
Glucose-Capillary: 162 mg/dL — ABNORMAL HIGH (ref 70–99)
Glucose-Capillary: 164 mg/dL — ABNORMAL HIGH (ref 70–99)
Glucose-Capillary: 160 mg/dL — ABNORMAL HIGH (ref 70–99)
Glucose-Capillary: 162 mg/dL — ABNORMAL HIGH (ref 70–99)

## 2014-12-04 LAB — BLOOD GAS, ARTERIAL
Acid-Base Excess: 3.6 mmol/L — ABNORMAL HIGH (ref 0.0–2.0)
Bicarbonate: 29.1 mEq/L — ABNORMAL HIGH (ref 20.0–24.0)
Drawn by: 235321
FIO2: 0.6 %
LHR: 20 {breaths}/min
O2 Saturation: 98.3 %
PATIENT TEMPERATURE: 97.1
PCO2 ART: 49.5 mmHg — AB (ref 35.0–45.0)
PEEP: 10 cmH2O
TCO2: 27.2 mmol/L (ref 0–100)
VT: 500 mL
pH, Arterial: 7.382 (ref 7.350–7.450)
pO2, Arterial: 136 mmHg — ABNORMAL HIGH (ref 80.0–100.0)

## 2014-12-04 LAB — CBC
HEMATOCRIT: 30.2 % — AB (ref 39.0–52.0)
HEMOGLOBIN: 10.4 g/dL — AB (ref 13.0–17.0)
MCH: 30.8 pg (ref 26.0–34.0)
MCHC: 34.4 g/dL (ref 30.0–36.0)
MCV: 89.3 fL (ref 78.0–100.0)
Platelets: 143 10*3/uL — ABNORMAL LOW (ref 150–400)
RBC: 3.38 MIL/uL — ABNORMAL LOW (ref 4.22–5.81)
RDW: 15.7 % — AB (ref 11.5–15.5)
WBC: 29.7 10*3/uL — ABNORMAL HIGH (ref 4.0–10.5)

## 2014-12-04 LAB — RENAL FUNCTION PANEL
Albumin: 2.2 g/dL — ABNORMAL LOW (ref 3.5–5.0)
Anion gap: 12 (ref 5–15)
BUN: 31 mg/dL — AB (ref 6–20)
CO2: 32 mmol/L (ref 22–32)
Calcium: 8.3 mg/dL — ABNORMAL LOW (ref 8.9–10.3)
Chloride: 91 mmol/L — ABNORMAL LOW (ref 101–111)
Creatinine, Ser: 2.85 mg/dL — ABNORMAL HIGH (ref 0.61–1.24)
GFR calc non Af Amer: 22 mL/min — ABNORMAL LOW (ref >60)
GFR, EST AFRICAN AMERICAN: 26 mL/min — AB (ref >60)
GLUCOSE: 167 mg/dL — AB (ref 70–99)
PHOSPHORUS: 4.1 mg/dL (ref 2.5–4.6)
Potassium: 4.2 mmol/L (ref 3.5–5.1)
Sodium: 135 mmol/L (ref 135–145)

## 2014-12-04 LAB — BASIC METABOLIC PANEL
ANION GAP: 13 (ref 5–15)
BUN: 27 mg/dL — ABNORMAL HIGH (ref 6–20)
CHLORIDE: 88 mmol/L — AB (ref 101–111)
CO2: 30 mmol/L (ref 22–32)
Calcium: 8.1 mg/dL — ABNORMAL LOW (ref 8.9–10.3)
Creatinine, Ser: 3.11 mg/dL — ABNORMAL HIGH (ref 0.61–1.24)
GFR calc Af Amer: 23 mL/min — ABNORMAL LOW (ref >60)
GFR calc non Af Amer: 20 mL/min — ABNORMAL LOW (ref >60)
Glucose, Bld: 168 mg/dL — ABNORMAL HIGH (ref 70–99)
Potassium: 4.4 mmol/L (ref 3.5–5.1)
SODIUM: 131 mmol/L — AB (ref 135–145)

## 2014-12-04 LAB — MAGNESIUM: Magnesium: 2.4 mg/dL (ref 1.7–2.4)

## 2014-12-04 MED ORDER — VITAL HIGH PROTEIN PO LIQD
1000.0000 mL | ORAL | Status: DC
  Administered 2014-12-04: 1000 mL
  Filled 2014-12-04 (×2): qty 1000

## 2014-12-04 MED ORDER — METOCLOPRAMIDE HCL 5 MG/ML IJ SOLN
5.0000 mg | Freq: Three times a day (TID) | INTRAMUSCULAR | Status: AC
  Administered 2014-12-04 – 2014-12-05 (×6): 5 mg via INTRAVENOUS
  Filled 2014-12-04 (×6): qty 2

## 2014-12-04 MED ORDER — STERILE WATER FOR INJECTION IV SOLN
INTRAVENOUS | Status: DC
  Administered 2014-12-04 – 2014-12-05 (×5): via INTRAVENOUS_CENTRAL
  Filled 2014-12-04 (×11): qty 150

## 2014-12-04 MED ORDER — QUETIAPINE FUMARATE 100 MG PO TABS
200.0000 mg | ORAL_TABLET | Freq: Two times a day (BID) | ORAL | Status: DC
  Administered 2014-12-04 – 2014-12-06 (×5): 200 mg via ORAL
  Filled 2014-12-04 (×5): qty 2

## 2014-12-04 NOTE — Progress Notes (Signed)
Date:  Dec 04, 2014 Update in condition from chart:  Summary -Resolving shock , will plan for trach once off pressors/ continues on full vent support as of [id number]. phenylephrine (NEO-SYNEPHRINE) 40 mg in dextrose 5 % 250 mL (0.16 mg/mL) infusion amiodarone (NEXTERONE PREMIX) 360 MG/200ML (1.8 mg/mL) IV infus midazolam (VERSED) 50 mg in sodium chloride 0.9 % 50 mL (1 mg/mL) infusionion Will continue to follow for Case Management needs.

## 2014-12-04 NOTE — Progress Notes (Signed)
Chaplain continues to encourage the family as they face this time of uncertainty with their family member. Chaplain escorted the family to the [place] and allowed them to express their grief. Chaplain gave a prayer shawl to the wife of the patient.   12/04/14 1200  Clinical Encounter Type  Visited With Family  Visit Type Follow-up;Spiritual support;Social support  Spiritual Encounters  Spiritual Needs Prayer;Emotional;Grief support

## 2014-12-04 NOTE — Progress Notes (Signed)
PULMONARY / CRITICAL CARE MEDICINE   Name: Allen Green MRN: [id number] DOB: Jan 21, 1951    ADMISSION DATE:  11/25/2014 CONSULTATION DATE: 4/26  REFERRING MD :  Triad  CHIEF COMPLAINT:  SOB  INITIAL PRESENTATION: 64 y/o male admitted on 4/26 with septic shock and acute respiratory failure due to cholecystitis and an abscess associated with a recently dislodged gallbladder drain. Course complicated by bibasal HCAP & ARDS range hypoxia & severe autoPEEP  STUDIES:  4/26 CT Abdomen> acute cholecystitis and gallbladder tract abscess 4/26 ECHO> poor study, can't comment on LV or RV size/function 4/29 CT abdomen >> Mildly prominent central small bowel loops in the lower abdomen and upper pelvis , decompressed gallbladder. 5/1 ven duplex neg bLE  SIGNIFICANT EVENTS: 4/26 intubated, Gallbladder drain placed by IR 4/29 multiple amio bolus for RVR 4/30 worsening hypoxia   SUBJECTIVE:   Int Agitated , on 250 mcg/h  fent gtt -on amio gtt Afebrile, On CVVH    VITAL SIGNS: Temp:  [95 F (35 C)-99.7 F (37.6 C)] 96.3 F (35.7 C) (05/03 0715) Pulse Rate:  [55-145] 134 (05/03 0715) Resp:  [15-23] 18 (05/03 0715) SpO2:  [96 %-100 %] 100 % (05/03 0733) FiO2 (%):  [60 %-80 %] 60 % (05/03 0735) Weight:  [281 lb 1.4 oz (127.5 kg)] 281 lb 1.4 oz (127.5 kg) (05/03 0500) HEMODYNAMICS:   VENTILATOR SETTINGS: Vent Mode:  [-] PRVC FiO2 (%):  [60 %-80 %] 60 % Set Rate:  [20 bmp] 20 bmp Vt Set:  [500 mL] 500 mL PEEP:  [10 cmH20] 10 cmH20 Plateau Pressure:  [12 cmH20-20 cmH20] 20 cmH20 INTAKE / OUTPUT:  Intake/Output Summary (Last 24 hours) at 12/04/14 0839 Last data filed at 12/04/14 0800  Gross per 24 hour  Intake 4906.33 ml  Output   4702 ml  Net 204.33 ml    PHYSICAL EXAMINATION: General:  On vent, agitated  HENT: NCAT, ETT in place, no neck PULM: CTA B, autopeep + CV: Tachy, regular, no mgr GI: BS+, soft, drain in place, large Derm: improved redness, clear bile GB drain Neuro:  sedated or agitated  LABS:  CBC  Recent Labs Lab 12/02/14 0340 12/03/14 0330 12/04/14 0445  WBC 20.3* 23.0* 29.7*  HGB 11.2* 10.8* 10.4*  HCT 33.4* 32.1* 30.2*  PLT 166 132* 143*   Coag's  Recent Labs Lab 11/28/14 0310  12/04/14 0020 12/04/14 0445 12/04/14 0751  APTT 38*  < > 73* 68* 84*  INR 1.36  --   --   --   --   < > = values in this interval not displayed. BMET  Recent Labs Lab 12/02/14 1540 12/03/14 1600 12/04/14 0445  NA 134* 127* 131*  K 4.2 4.4 4.4  CL 92* 87* 88*  CO2 33* 31 30  BUN 28* 26* 27*  CREATININE 2.71* 3.05* 3.11*  GLUCOSE 143* 153* 168*   Electrolytes  Recent Labs Lab 12/01/14 1600 12/02/14 0340 12/02/14 1540 12/03/14 0330 12/03/14 1600 12/04/14 0445  CALCIUM 7.7*  --  7.8*  --  7.8* 8.1*  MG  --  2.5*  --  2.5*  --  2.4  PHOS 2.7  --  3.1  --  4.5  --    Sepsis Markers  Recent Labs Lab 11/28/14 0310 11/29/14 0420 11/30/14 1010 12/01/14 0430  LATICACIDVEN 1.9  --  2.1*  --   PROCALCITON  --  56.94  --  26.51   ABG  Recent Labs Lab 12/02/14 0748 12/03/14 0522 12/04/14 0530  PHART 7.339* 7.375 7.382  PCO2ART 66.7* 58.6* 49.5*  PO2ART 77.7* 89.2 136*   Liver Enzymes  Recent Labs Lab 12/01/14 1600 12/02/14 1540 12/03/14 1600  ALBUMIN 2.3* 2.4* 2.1*   Cardiac Enzymes  Recent Labs Lab 11/27/14 1250 11/27/14 2048 11/28/14 1010  TROPONINI <0.03 <0.03 <0.03   Glucose  Recent Labs Lab 12/03/14 1133 12/03/14 1614 12/03/14 1959 12/03/14 2335 12/04/14 0312 12/04/14 0737  GLUCAP 177* 164* 172* 155* 162* 164*    Imaging    ASSESSMENT / PLAN:  PULMONARY OETT 4/26(PCCM)>> A: Acute respiratory failure with hypercapnea> improved 4/27, passing SBT Severe COPD, but doing surprisingly well on 4/27 AM, so an elective Ex-lap as outpatient for cholecystectomy not unreasonable Tobacco abuse BLL HCAP 5/1 Worse hypoxia due to ? Vent asynchrony, doubt PE P:   Hold wean  Vent bundle BD's May need  trach if he survives current insult  Keep RR to 20 to avoid  autopeep Lower FIO2 & PEEP   CARDIOVASCULAR CVL 4/26 L IJ CVL>> A:  Septic shock > worse 4/29, echo not helpful,? Cardiogenic component, more septic at this point -Repeat troponin>>neg P:  Levophed for MAP > 65 as needed ct vaso, Off  neo gtt ct amio gtt  RENAL Lab Results  Component Value Date   CREATININE 3.11* 12/04/2014   CREATININE 3.05* 12/03/2014   CREATININE 2.71* 12/02/2014    Recent Labs Lab 12/02/14 1540 12/03/14 1600 12/04/14 0445  K 4.2 4.4 4.4      A: Oliguric AKI -on CVVH since 4/27 P:   Monitor BMET and UOP Renal following ct CVVH -keep even while on pressors  GASTROINTESTINAL A:   Acute Cholecystis > with peri-gallbladder abscess, s/p drain 4/26 per IR Recent IR drain 4/3->4/12  Ileus P:   Monitor drain output restart TFs @ 20/h 5/3 reglan 5 q 8h   HEMATOLOGIC A:   Chronic polycythemia  P:  Follow H/H Monitor for bleeding  INFECTIOUS A:   Septic shock> worse 4/29 likely all related to draining abscess on 4/26, don't suspect secondary source or inappropriate antibiotic coverage at this point, cipro added 4/27 4/29 recheck ct abd P:   BCx2  4/26>>bacteroides Sputum 4/26>>nl flora Wound culture 4/26 > bacteroides 4/26 c dif>>neg Abx:  4/26 vanc>> 4/30 4/26 zoysn>>4/29 4/27 cipro>> 5/1 4/29 meropenem >>  Rechk resp cx  ENDOCRINE CBG (last 3)   Recent Labs  12/03/14 2335 12/04/14 0312 12/04/14 0737  GLUCAP 155* 162* 164*     A:   No acute issue P:   SSI  NEUROLOGIC A:  Intact prior to intubation Severe agitation/ asynchrony  P:   RASS goal: -1 Continue fentanyl per PAD protocol ct versed gtt Increase seroquel to 200 bid   FAMILY  - Updates: updated at length 5/2 AM bedside  - Inter-disciplinary family meet or Palliative Care meeting due by: 5/3  Summary -Resolving shock , will plan for trach once off pressors  The patient is critically  ill with multiple organ systems failure and requires high complexity decision making for assessment and support, frequent evaluation and titration of therapies, application of advanced monitoring technologies and extensive interpretation of multiple databases. Critical Care Time devoted to patient care services described in this note independent of APP time is 35 minutes.   Kara Mead MD. Shade Flood. [facility] Pager [phone] If no response call [phone]     12/04/2014, 8:39 AM

## 2014-12-04 NOTE — Progress Notes (Signed)
[facility] Progress Note   Subjective: no sig UOP , I/O + 400 ml.  PTT in range, hep gtt down to 1800/ hr  Filed Vitals:   12/04/14 0645 12/04/14 0700 12/04/14 0715 12/04/14 0733  BP:      Pulse: 133 134 134   Temp: 96.8 F (36 C) 96.8 F (36 C) 96.3 F (35.7 C)   TempSrc:      Resp: 20 19 18    Height:      Weight:      SpO2: 100% 100% 100% 100%   Exam: On vent, sedated, moves about occasionally No jvd Chest clear ant and lat RRR no rub/ gallop Abd obese, nondistended Moderate diffuse UE/LE edema Neuro moves purposefully in response to stimuli  UNa 51, UCr 136 UA  gran casts, amber, 100 prot, 1.022, 11-20 wbc, no rbc CXR today no chg bibasilar airspace dz        Assessment: 1. AKI / ATN - from sepsis/ contrast 2. Resp acidosis - on bicarb RF, will dc as CO2 retention appears to be acute not chronic 3. VDRF 4. Sepsis - possible gallbladder abscess/ cholecystitis, possible PNA - remains septic but pressors coming down 5. Volume excess - moderate, CVP in range  Plan - continue CRRT , keeping even, hep Elizabeth Sauer MD  pager [phone]    cell [phone]  12/04/2014, 8:16 AM     Recent Labs Lab 12/01/14 1600 12/02/14 1540 12/03/14 1600 12/04/14 0445  NA 133* 134* 127* 131*  K 4.2 4.2 4.4 4.4  CL 93* 92* 87* 88*  CO2 33* 33* 31 30  GLUCOSE 138* 143* 153* 168*  BUN 24* 28* 26* 27*  CREATININE 2.97* 2.71* 3.05* 3.11*  CALCIUM 7.7* 7.8* 7.8* 8.1*  PHOS 2.7 3.1 4.5  --     Recent Labs Lab 12/01/14 1600 12/02/14 1540 12/03/14 1600  ALBUMIN 2.3* 2.4* 2.1*    Recent Labs Lab 11/29/14 0420  12/02/14 0340 12/03/14 0330 12/04/14 0445  WBC 32.9*  < > 20.3* 23.0* 29.7*  NEUTROABS 30.9*  --   --   --   --   HGB 13.0  < > 11.2* 10.8* 10.4*  HCT 41.2  < > 33.4* 32.1* 30.2*  MCV 97.9  < > 92.5 90.2 89.3  PLT 238  < > 166 132* 143*  < > = values in this interval not displayed. [name] antiseptic oral rinse  7 mL Mouth Rinse QID  .  chlorhexidine  15 mL Mouth Rinse BID  . feeding supplement (PRO-STAT SUGAR FREE 64)  30 mL Per Tube TID  . fentaNYL (SUBLIMAZE) injection  50 mcg Intravenous Once  . heparin subcutaneous  5,000 Units Subcutaneous 3 times per day  . hydrocortisone sod succinate (SOLU-CORTEF) inj  50 mg Intravenous Q6H  . ipratropium-albuterol  3 mL Nebulization Q6H  . meropenem (MERREM) IV  1 g Intravenous Q12H  . pantoprazole (PROTONIX) IV  40 mg Intravenous Q24H  . QUEtiapine  100 mg Oral BID  . sodium chloride  3 mL Intravenous Q12H   . sodium chloride 1,000 mL (12/01/14 0259)  . sodium chloride 10 mL/hr at 12/01/14 1400  . amiodarone 30 mg/hr (12/04/14 0020)  . fentaNYL infusion INTRAVENOUS 250 mcg/hr (12/03/14 2223)  . heparin 10,000 units/ 20 mL infusion syringe 1,800 Units/hr (12/04/14 0800)  . midazolam (VERSED) infusion 1 mg/hr (12/03/14 1500)  . norepinephrine (LEVOPHED) Adult infusion 18 mcg/min (12/04/14 0800)  . phenylephrine (NEO-SYNEPHRINE) Adult infusion  30 mcg/min (12/04/14 0020)  . dialysis replacement fluid (prismasate) 400 mL/hr at 12/03/14 2015  . dialysate (PRISMASATE) 1,500 mL/hr at 12/04/14 0753  . sodium bicarbonate (isotonic) 1000 mL infusion 200 mL/hr at 12/03/14 2358  . vasopressin (PITRESSIN) infusion - *FOR SHOCK* 0.03 Units/min (12/03/14 1241)   acetaminophen **OR** acetaminophen, docusate, fentaNYL, fentaNYL (SUBLIMAZE) injection, heparin, heparin, heparin, midazolam, ondansetron **OR** ondansetron (ZOFRAN) IV

## 2014-12-04 NOTE — Progress Notes (Signed)
NUTRITION FOLLOW UP  Intervention:   Continue Vital HP @ 20 ml/hr via OGT.   30 ml Prostat TID.  If medically able, advance per MD to goal rate of 50 mL/hr.    Current tube feeding regimen provides 780 kcal, 87 grams of protein, and 912 ml of H2O.   Nutrition Dx:   Enteral nutrition to provide 65-70% of estimated calorie needs based on ASPEN guidelines for hypocaloric, high protein feeding in critically ill obese individuals  Goal:   Pt to meet >/= 90% of their estimated nutrition needs; not met  Monitor:   Weight trend, vent status/settings, labs, TF initiation  Assessment:   64 year old Caucasian man with past medical history significant for COPD, GERD and a recently discharged gallbladder drain with development of an abscess and septic shock/acute respiratory failure who presented to the hospital yesterday. Had a CT scan of his abdomen/pelvis with intravenous contrast yesterday to study his area of dislodged gallbladder drain better prior to undergoing decompression of subcutaneous abscess and new drain placement in gallbladder.   - pt with worsening septic shock - drain replacement 4/26 - No signs of fat or muscle depletion - pt on CRRT  - per rn, pt with 600 mL residuals with TF coming out of mouth on 5/1. TF held and restarted this am at 20 mL/hr, Reglan added.  - Pt in + 6.1 L since admission. Weight 245 lbs on 4/12. Currently 281 lbs.  - RD will continue to monitor - labs and medications reviewed  Patient is currently intubated on ventilator support MV: 12.8 L/min Temp (24hrs), Avg:97.4 F (36.3 C), Min:95.5 F (35.3 C), Max:99.7 F (37.6 C)  Propofol: none  Height: Ht Readings from Last 1 Encounters:  11/27/14 $RemoveB'5\' 11"'cSjvNSYQ$  (1.803 m)    Weight Status:   Wt Readings from Last 1 Encounters:  12/04/14 281 lb 1.4 oz (127.5 kg)    Re-estimated needs:  Kcal: 2301  (2641-5830 = 11-14 kcal/kg actual body weight) Protein: >151 g Fluid: per MD  Skin: intact  Diet  Order: Diet NPO time specified   Intake/Output Summary (Last 24 hours) at 12/04/14 1222 Last data filed at 12/04/14 1200  Gross per 24 hour  Intake 4204.92 ml  Output   4096 ml  Net 108.92 ml    Last BM: 4/27   Labs:   Recent Labs Lab 12/01/14 1600 12/02/14 0340 12/02/14 1540 12/03/14 0330 12/03/14 1600 12/04/14 0445  NA 133*  --  134*  --  127* 131*  K 4.2  --  4.2  --  4.4 4.4  CL 93*  --  92*  --  87* 88*  CO2 33*  --  33*  --  31 30  BUN 24*  --  28*  --  26* 27*  CREATININE 2.97*  --  2.71*  --  3.05* 3.11*  CALCIUM 7.7*  --  7.8*  --  7.8* 8.1*  MG  --  2.5*  --  2.5*  --  2.4  PHOS 2.7  --  3.1  --  4.5  --   GLUCOSE 138*  --  143*  --  153* 168*    CBG (last 3)   Recent Labs  12/04/14 0312 12/04/14 0737 12/04/14 1133  GLUCAP 162* 164* 160*    Scheduled Meds: . antiseptic oral rinse  7 mL Mouth Rinse QID  . chlorhexidine  15 mL Mouth Rinse BID  . feeding supplement (PRO-STAT SUGAR FREE 64)  30 mL Per Tube  TID  . feeding supplement (VITAL HIGH PROTEIN)  1,000 mL Per Tube Q24H  . fentaNYL (SUBLIMAZE) injection  50 mcg Intravenous Once  . heparin subcutaneous  5,000 Units Subcutaneous 3 times per day  . hydrocortisone sod succinate (SOLU-CORTEF) inj  50 mg Intravenous Q6H  . ipratropium-albuterol  3 mL Nebulization Q6H  . meropenem (MERREM) IV  1 g Intravenous Q12H  . metoCLOPramide (REGLAN) injection  5 mg Intravenous 3 times per day  . pantoprazole (PROTONIX) IV  40 mg Intravenous Q24H  . QUEtiapine  200 mg Oral BID  . sodium chloride  3 mL Intravenous Q12H    Continuous Infusions: . sodium chloride 1,000 mL (12/01/14 0259)  . sodium chloride 10 mL/hr at 12/01/14 1400  . amiodarone 30 mg/hr (12/04/14 0020)  . fentaNYL infusion INTRAVENOUS 250 mcg/hr (12/04/14 0848)  . heparin 10,000 units/ 20 mL infusion syringe 1,650 Units/hr (12/04/14 1200)  . midazolam (VERSED) infusion 1 mg/hr (12/03/14 1500)  . norepinephrine (LEVOPHED) Adult infusion  10 mcg/min (12/04/14 1153)  . dialysis replacement fluid (prismasate) 400 mL/hr at 12/04/14 1007  . dialysate (PRISMASATE) 1,500 mL/hr at 12/04/14 1120  . sodium bicarbonate (isotonic) 1000 mL infusion 200 mL/hr at 12/04/14 1000  . vasopressin (PITRESSIN) infusion - *FOR SHOCK* 0.03 Units/min (12/04/14 1145)    Laurette Schimke [place], [place], [place]

## 2014-12-05 ENCOUNTER — Inpatient Hospital Stay (HOSPITAL_COMMUNITY): Payer: Medicare Other

## 2014-12-05 LAB — GLUCOSE, CAPILLARY
GLUCOSE-CAPILLARY: 153 mg/dL — AB (ref 70–99)
GLUCOSE-CAPILLARY: 152 mg/dL — AB (ref 70–99)
Glucose-Capillary: 160 mg/dL — ABNORMAL HIGH (ref 70–99)
Glucose-Capillary: 161 mg/dL — ABNORMAL HIGH (ref 70–99)
Glucose-Capillary: 168 mg/dL — ABNORMAL HIGH (ref 70–99)
Glucose-Capillary: 183 mg/dL — ABNORMAL HIGH (ref 70–99)

## 2014-12-05 LAB — RENAL FUNCTION PANEL
ALBUMIN: 2.1 g/dL — AB (ref 3.5–5.0)
ANION GAP: 10 (ref 5–15)
BUN: 49 mg/dL — ABNORMAL HIGH (ref 6–20)
CO2: 33 mmol/L — ABNORMAL HIGH (ref 22–32)
Calcium: 8.1 mg/dL — ABNORMAL LOW (ref 8.9–10.3)
Chloride: 93 mmol/L — ABNORMAL LOW (ref 101–111)
Creatinine, Ser: 2.72 mg/dL — ABNORMAL HIGH (ref 0.61–1.24)
GFR calc non Af Amer: 23 mL/min — ABNORMAL LOW (ref >60)
GFR, EST AFRICAN AMERICAN: 27 mL/min — AB (ref >60)
GLUCOSE: 152 mg/dL — AB (ref 70–99)
PHOSPHORUS: 3.7 mg/dL (ref 2.5–4.6)
Potassium: 3.7 mmol/L (ref 3.5–5.1)
Sodium: 136 mmol/L (ref 135–145)

## 2014-12-05 LAB — APTT
APTT: 54 s — AB (ref 24–37)
aPTT: 73 seconds — ABNORMAL HIGH (ref 24–37)
aPTT: 60 seconds — ABNORMAL HIGH (ref 24–37)
aPTT: 50 seconds — ABNORMAL HIGH (ref 24–37)

## 2014-12-05 LAB — CBC
HEMATOCRIT: 28.1 % — AB (ref 39.0–52.0)
HEMOGLOBIN: 9.5 g/dL — AB (ref 13.0–17.0)
MCH: 30.2 pg (ref 26.0–34.0)
MCHC: 33.8 g/dL (ref 30.0–36.0)
MCV: 89.2 fL (ref 78.0–100.0)
Platelets: 197 10*3/uL (ref 150–400)
RBC: 3.15 MIL/uL — ABNORMAL LOW (ref 4.22–5.81)
RDW: 15.3 % (ref 11.5–15.5)
WBC: 22 10*3/uL — ABNORMAL HIGH (ref 4.0–10.5)

## 2014-12-05 LAB — PROCALCITONIN: Procalcitonin: 2.25 ng/mL

## 2014-12-05 LAB — MAGNESIUM: Magnesium: 2.4 mg/dL (ref 1.7–2.4)

## 2014-12-05 MED ORDER — PRISMASOL BGK 4/2.5 32-4-2.5 MEQ/L IV SOLN
INTRAVENOUS | Status: DC
  Administered 2014-12-05 – 2014-12-07 (×5): via INTRAVENOUS_CENTRAL
  Filled 2014-12-05 (×6): qty 5000

## 2014-12-05 MED ORDER — VITAL HIGH PROTEIN PO LIQD
1000.0000 mL | ORAL | Status: DC
  Administered 2014-12-05: 1000 mL
  Filled 2014-12-05 (×3): qty 1000

## 2014-12-05 NOTE — Progress Notes (Signed)
PULMONARY / CRITICAL CARE MEDICINE   Name: SHAHEER BONFIELD MRN: [id number] DOB: 1951-04-01    ADMISSION DATE:  11/03/2014 CONSULTATION DATE: 4/26  REFERRING MD :  Triad  CHIEF COMPLAINT:  SOB  INITIAL PRESENTATION: 64 y/o male admitted on 4/26 with septic shock and acute respiratory failure due to cholecystitis and an abscess associated with a recently dislodged gallbladder drain. Course complicated by bibasal HCAP & ARDS range hypoxia & severe autoPEEP  STUDIES:  4/26 CT Abdomen> acute cholecystitis and gallbladder tract abscess 4/26 ECHO> poor study, can't comment on LV or RV size/function 4/29 CT abdomen >> Mildly prominent central small bowel loops in the lower abdomen and upper pelvis , decompressed gallbladder. 5/1 ven duplex neg bLE  SIGNIFICANT EVENTS: 4/26 intubated, Gallbladder drain placed by IR 4/29 multiple amio bolus for RVR 4/30 worsening hypoxia 5/3 improving pressor needs   SUBJECTIVE:   Int Agitated , off  fent gtt -on amio gtt Afebrile, On CVVH   VITAL SIGNS: Temp:  [97 F (36.1 C)-99 F (37.2 C)] 98.2 F (36.8 C) (05/04 0800) Pulse Rate:  [54-146] 103 (05/04 0745) Resp:  [12-24] 20 (05/04 0800) BP: (80-90)/(55-71) 89/71 mmHg (05/04 0745) SpO2:  [89 %-99 %] 94 % (05/04 0800) FiO2 (%):  [40 %] 40 % (05/04 0800) Weight:  [268 lb 1.3 oz (121.6 kg)] 268 lb 1.3 oz (121.6 kg) (05/04 0405) HEMODYNAMICS:   VENTILATOR SETTINGS: Vent Mode:  [-] PRVC FiO2 (%):  [40 %] 40 % Set Rate:  [20 bmp] 20 bmp Vt Set:  [500 mL] 500 mL PEEP:  [5 cmH20] 5 cmH20 Plateau Pressure:  [11 cmH20-18 cmH20] 11 cmH20 INTAKE / OUTPUT:  Intake/Output Summary (Last 24 hours) at 12/05/14 0832 Last data filed at 12/05/14 0800  Gross per 24 hour  Intake 3324.35 ml  Output   3106 ml  Net 218.35 ml    PHYSICAL EXAMINATION: General:  On vent, agitated  HENT: NCAT, ETT in place, no jvd PULM: CTA B, autopeep + CV: Tachy, regular, no mgr GI: BS+, soft, drain in place, large Derm:  improved redness, clear bile GB drain Neuro: sedated or agitated, moves all 4Es  LABS:  CBC  Recent Labs Lab 12/03/14 0330 12/04/14 0445 12/05/14 0406  WBC 23.0* 29.7* 22.0*  HGB 10.8* 10.4* 9.5*  HCT 32.1* 30.2* 28.1*  PLT 132* 143* 197   Coag's  Recent Labs Lab 12/05/14 0036 12/05/14 0357 12/05/14 0750  APTT 54* 73* 60*   BMET  Recent Labs Lab 12/03/14 1600 12/04/14 0445 12/04/14 1556  NA 127* 131* 135  K 4.4 4.4 4.2  CL 87* 88* 91*  CO2 31 30 32  BUN 26* 27* 31*  CREATININE 3.05* 3.11* 2.85*  GLUCOSE 153* 168* 167*   Electrolytes  Recent Labs Lab 12/02/14 1540 12/03/14 0330 12/03/14 1600 12/04/14 0445 12/04/14 1556 12/05/14 0406  CALCIUM 7.8*  --  7.8* 8.1* 8.3*  --   MG  --  2.5*  --  2.4  --  2.4  PHOS 3.1  --  4.5  --  4.1  --    Sepsis Markers  Recent Labs Lab 11/29/14 0420 11/30/14 1010 12/01/14 0430  LATICACIDVEN  --  2.1*  --   PROCALCITON 56.94  --  26.51   ABG  Recent Labs Lab 12/02/14 0748 12/03/14 0522 12/04/14 0530  PHART 7.339* 7.375 7.382  PCO2ART 66.7* 58.6* 49.5*  PO2ART 77.7* 89.2 136*   Liver Enzymes  Recent Labs Lab 12/02/14 1540 12/03/14 1600 12/04/14  1556  ALBUMIN 2.4* 2.1* 2.2*   Cardiac Enzymes  Recent Labs Lab 11/28/14 1010  TROPONINI <0.03   Glucose  Recent Labs Lab 12/04/14 1133 12/04/14 1531 12/04/14 2008 12/04/14 2353 12/05/14 0404 12/05/14 0750  GLUCAP 160* 162* 155* 161* 153* 160*    Imaging CXR 5/4 bibasal pna    ASSESSMENT / PLAN:  PULMONARY OETT 4/26(PCCM)>> A: Acute respiratory failure with hypercapnea> improved 4/27, passing SBT Severe COPD, but doing surprisingly well on 4/27 AM, so an elective Ex-lap as outpatient for cholecystectomy not unreasonable Tobacco abuse BLL HCAP 5/1 Worse hypoxia due to ? Vent asynchrony, doubt PE -improved P:   Hold wean while on pressors Vent bundle BD's May need trach onec off pressors Keep RR to 20 to avoid   autopeep   CARDIOVASCULAR CVL 4/26 L IJ CVL>> A:  Septic shock > worse 4/29, echo not helpful,? Cardiogenic component, more septic at this point -Repeat troponin>>neg AF-RVR P:  Levophed for MAP > 65 as needed ct vaso, Off  neo gtt ct amio gtt  RENAL Lab Results  Component Value Date   CREATININE 2.85* 12/04/2014   CREATININE 3.11* 12/04/2014   CREATININE 3.05* 12/03/2014    Recent Labs Lab 12/03/14 1600 12/04/14 0445 12/04/14 1556  K 4.4 4.4 4.2      A: Oliguric AKI -on CVVH since 4/27 P:   Monitor BMET and UOP Renal following ct CVVH -keep even while on pressors  GASTROINTESTINAL A:   Acute Cholecystis > with peri-gallbladder abscess, s/p drain 4/26 per IR Recent IR drain 4/3->4/12  Ileus P:   Monitor drain output restarted TFs @ 20/h 5/3 -increase to 30/h reglan 5 q 8h x 6 doses   HEMATOLOGIC A:   Chronic polycythemia  P:  Follow H/H Monitor for bleeding  INFECTIOUS A:   Septic shock> worse 4/29 likely all related to draining abscess on 4/26, don't suspect secondary source or inappropriate antibiotic coverage at this point, cipro added 4/27  P:   BCx2  4/26>>bacteroides Sputum 4/26>>nl flora Wound culture 4/26 > bacteroides 4/26 c dif>>neg 5/3 resp cx >> candida Abx:  4/26 vanc>> 4/30 4/26 zoysn>>4/29 4/27 cipro>> 5/1 4/29 meropenem >> Pct algorithm  ENDOCRINE CBG (last 3)   Recent Labs  12/04/14 2353 12/05/14 0404 12/05/14 0750  GLUCAP 161* 153* 160*     A:   No acute issue P:   SSI  NEUROLOGIC A:  Intact prior to intubation Severe agitation/ asynchrony  P:   RASS goal: -1 Continue fentanyl per PAD protocol ct versed gtt ct seroquel to 200 bid   FAMILY  - Updates: updated at length 5/2 AM bedside  - Inter-disciplinary family meet or Palliative Care meeting due by: 5/3  Summary -Resolving shock , will plan for trach once off pressors  The patient is critically ill with multiple organ systems failure and  requires high complexity decision making for assessment and support, frequent evaluation and titration of therapies, application of advanced monitoring technologies and extensive interpretation of multiple databases. Critical Care Time devoted to patient care services described in this note independent of APP time is 35 minutes.   Kara Mead MD. Shade Flood. [facility] Pager [phone] If no response call [phone]     12/05/2014, 8:32 AM

## 2014-12-05 NOTE — Progress Notes (Signed)
[facility] Progress Note   Subjective: no sig UOP , hep down to 1650 u/ hr.  Pressors were off then sedation resumed so now is requiring low dose levo gtt at 5 ug /hr.    Filed Vitals:   12/05/14 1330 12/05/14 1345 12/05/14 1400 12/05/14 1415  BP:      Pulse:  141  87  Temp: 97.5 F (36.4 C) 97.5 F (36.4 C) 97.5 F (36.4 C) 97.5 F (36.4 C)  TempSrc:      Resp: 16 15 14 14   Height:      Weight:      SpO2: 96% 95% 95% 95%   Exam: On vent, sedated, moves about occasionally No jvd Chest clear ant and lat RRR no rub/ gallop Abd obese, nondistended Moderate diffuse UE/LE edema Neuro moves purposefully in response to stimuli  UNa 51, UCr 136 UA  gran casts, amber, 100 prot, 1.022, 11-20 wbc, no rbc CXR no chg bibasilar airspace dz        Assessment: 1. AKI / ATN - from sepsis/ contrast, oliguric. Cont CRRT, day #8 2. Resp acidosis - prob chronic, no chg 3. VDRF 4. Sepsis - possible gallbladder abscess/ cholecystitis, possible PNA - remains septic but pressors coming down 5. Volume excess - moderate, CVP in range  Plan - continue CRRT, start to remove some fluid 50 cc/hr    Kelly Splinter MD  pager [phone]    cell [phone]  12/05/2014, 2:41 PM     Recent Labs Lab 12/02/14 1540 12/03/14 1600 12/04/14 0445 12/04/14 1556  NA 134* 127* 131* 135  K 4.2 4.4 4.4 4.2  CL 92* 87* 88* 91*  CO2 33* 31 30 32  GLUCOSE 143* 153* 168* 167*  BUN 28* 26* 27* 31*  CREATININE 2.71* 3.05* 3.11* 2.85*  CALCIUM 7.8* 7.8* 8.1* 8.3*  PHOS 3.1 4.5  --  4.1    Recent Labs Lab 12/02/14 1540 12/03/14 1600 12/04/14 1556  ALBUMIN 2.4* 2.1* 2.2*    Recent Labs Lab 11/29/14 0420  12/03/14 0330 12/04/14 0445 12/05/14 0406  WBC 32.9*  < > 23.0* 29.7* 22.0*  NEUTROABS 30.9*  --   --   --   --   HGB 13.0  < > 10.8* 10.4* 9.5*  HCT 41.2  < > 32.1* 30.2* 28.1*  MCV 97.9  < > 90.2 89.3 89.2  PLT 238  < > 132* 143* 197  < > = values in this interval not  displayed. [name] antiseptic oral rinse  7 mL Mouth Rinse QID  . chlorhexidine  15 mL Mouth Rinse BID  . feeding supplement (PRO-STAT SUGAR FREE 64)  30 mL Per Tube TID  . feeding supplement (VITAL HIGH PROTEIN)  1,000 mL Per Tube Q24H  . fentaNYL (SUBLIMAZE) injection  50 mcg Intravenous Once  . heparin subcutaneous  5,000 Units Subcutaneous 3 times per day  . hydrocortisone sod succinate (SOLU-CORTEF) inj  50 mg Intravenous Q6H  . ipratropium-albuterol  3 mL Nebulization Q6H  . meropenem (MERREM) IV  1 g Intravenous Q12H  . metoCLOPramide (REGLAN) injection  5 mg Intravenous 3 times per day  . pantoprazole (PROTONIX) IV  40 mg Intravenous Q24H  . QUEtiapine  200 mg Oral BID  . sodium chloride  3 mL Intravenous Q12H   . sodium chloride 10 mL/hr at 12/05/14 0700  . sodium chloride 10 mL/hr at 12/05/14 0700  . amiodarone 30 mg/hr (12/05/14 1302)  . fentaNYL infusion INTRAVENOUS 50 mcg/hr (12/05/14  1353)  . heparin 10,000 units/ 20 mL infusion syringe 1,650 Units/hr (12/05/14 1400)  . midazolam (VERSED) infusion 1 mg/hr (12/05/14 1413)  . norepinephrine (LEVOPHED) Adult infusion 5 mcg/min (12/05/14 1414)  . dialysis replacement fluid (prismasate) 400 mL/hr at 12/05/14 1214  . dialysate (PRISMASATE) 1,500 mL/hr at 12/05/14 1149  . sodium bicarbonate (isotonic) 1000 mL infusion 200 mL/hr at 12/05/14 1233  . vasopressin (PITRESSIN) infusion - *FOR SHOCK* 0.03 Units/min (12/05/14 1055)   acetaminophen **OR** acetaminophen, docusate, fentaNYL, fentaNYL (SUBLIMAZE) injection, heparin, heparin, heparin, midazolam, ondansetron **OR** ondansetron (ZOFRAN) IV

## 2014-12-05 NOTE — Progress Notes (Signed)
   12/05/14 1100  Clinical Encounter Type  Visited With Patient and family together  Visit Type Follow-up;Spiritual support;Social support  Referral From [facility];Emotional;Grief support   Chaplain visited with family as well as with patient.  Patient's condition has improved somewhat and family remains hopeful with this change. I will continue to follow patient and family.

## 2014-12-05 NOTE — Progress Notes (Addendum)
12/05/2014 at 2125, CRRT filter CLOTTED requiring replacement of the filter. The CRRT machine would not allow the option to continue with treatment on the same patient, presumably due to the number of filter changes that had taken place on this patient. The machine was set up from the beginning with the patient information and CRRT prescription information and a new filter was placed. The total time for the reset was 1hour 5 minutes. The information on fluid removal from 2100-2125 was not accessible and therefore lost. The patient was without treatment for 1 hour 5 minutes. Treatment has now resumed. Luther Parody, RN

## 2014-12-05 NOTE — Progress Notes (Signed)
NUTRITION FOLLOW UP  Intervention:   Continue Vital HP @ 30 ml/hr via OGT.   30 ml Prostat TID.  If medically able, advance per MD to goal rate of 50 mL/hr.    Current tube feeding regimen provides 1020 kcal, 108 grams of protein, and 605 ml of H2O.   Nutrition Dx:   Enteral nutrition to provide 65-70% of estimated calorie needs based on ASPEN guidelines for hypocaloric, high protein feeding in critically ill obese individuals  Goal:   Pt to meet >/= 90% of their estimated nutrition needs; not met  Monitor:   Weight trend, vent status/settings, labs, TF tolerance  Assessment:   64 year old Caucasian man with past medical history significant for COPD, GERD and a recently discharged gallbladder drain with development of an abscess and septic shock/acute respiratory failure who presented to the hospital yesterday. Had a CT scan of his abdomen/pelvis with intravenous contrast yesterday to study his area of dislodged gallbladder drain better prior to undergoing decompression of subcutaneous abscess and new drain placement in gallbladder.   - pt with septic shock - drain replacement 4/26 - No signs of fat or muscle depletion - pt on CRRT  - per rn, pt with 600 mL residuals with TF coming out of mouth on 5/1. TF held and restarted 5/3 at 20 mL/hr, Reglan added. Advanced 5/4 to 30 mL/hr by MD. Minimal residuals.  - Pt in + 6.3 L since admission. Weight 245 lbs on 4/12. Currently 268 lbs.  - RD will continue to monitor - labs and medications reviewed  Patient is currently intubated on ventilator support MV: 13.0 L/min Temp (24hrs), Avg:98.1 F (36.7 C), Min:97.3 F (36.3 C), Max:99 F (37.2 C)  Propofol: none  Height: Ht Readings from Last 1 Encounters:  11/27/14 _0  (1.803 m)    Weight Status:   Wt Readings from Last 1 Encounters:  12/05/14 268 lb 1.3 oz (121.6 kg)    Re-estimated needs:  Kcal: 2354  (8921-1941 = 11-14 kcal/kg actual body weight) Protein: >151  g Fluid: per MD  Skin: intact  Diet Order: Diet NPO time specified   Intake/Output Summary (Last 24 hours) at 12/05/14 1227 Last data filed at 12/05/14 1200  Gross per 24 hour  Intake 3224.97 ml  Output   3121 ml  Net 103.97 ml    Last BM: 5/1- loose/watery (pt C.Diff negative)   Labs:   Recent Labs Lab 12/02/14 1540 12/03/14 0330 12/03/14 1600 12/04/14 0445 12/04/14 1556 12/05/14 0406  NA 134*  --  127* 131* 135  --   K 4.2  --  4.4 4.4 4.2  --   CL 92*  --  87* 88* 91*  --   CO2 33*  --  31 30 32  --   BUN 28*  --  26* 27* 31*  --   CREATININE 2.71*  --  3.05* 3.11* 2.85*  --   CALCIUM 7.8*  --  7.8* 8.1* 8.3*  --   MG  --  2.5*  --  2.4  --  2.4  PHOS 3.1  --  4.5  --  4.1  --   GLUCOSE 143*  --  153* 168* 167*  --     CBG (last 3)   Recent Labs  12/04/14 2353 12/05/14 0404 12/05/14 0750  GLUCAP 161* 153* 160*    Scheduled Meds: . antiseptic oral rinse  7 mL Mouth Rinse QID  . chlorhexidine  15 mL Mouth Rinse BID  .  feeding supplement (PRO-STAT SUGAR FREE 64)  30 mL Per Tube TID  . feeding supplement (VITAL HIGH PROTEIN)  1,000 mL Per Tube Q24H  . fentaNYL (SUBLIMAZE) injection  50 mcg Intravenous Once  . heparin subcutaneous  5,000 Units Subcutaneous 3 times per day  . hydrocortisone sod succinate (SOLU-CORTEF) inj  50 mg Intravenous Q6H  . ipratropium-albuterol  3 mL Nebulization Q6H  . meropenem (MERREM) IV  1 g Intravenous Q12H  . metoCLOPramide (REGLAN) injection  5 mg Intravenous 3 times per day  . pantoprazole (PROTONIX) IV  40 mg Intravenous Q24H  . QUEtiapine  200 mg Oral BID  . sodium chloride  3 mL Intravenous Q12H    Continuous Infusions: . sodium chloride 10 mL/hr at 12/05/14 0700  . sodium chloride 10 mL/hr at 12/05/14 0700  . amiodarone 30 mg/hr (12/05/14 0700)  . fentaNYL infusion INTRAVENOUS Stopped (12/05/14 0734)  . heparin 10,000 units/ 20 mL infusion syringe 1,650 Units/hr (12/05/14 1223)  . midazolam (VERSED) infusion  Stopped (12/05/14 0734)  . norepinephrine (LEVOPHED) Adult infusion Stopped (12/05/14 1028)  . dialysis replacement fluid (prismasate) 400 mL/hr at 12/05/14 1214  . dialysate (PRISMASATE) 1,500 mL/hr at 12/05/14 1149  . sodium bicarbonate (isotonic) 1000 mL infusion 200 mL/hr at 12/05/14 0824  . vasopressin (PITRESSIN) infusion - *FOR SHOCK* 0.03 Units/min (12/05/14 1055)    Laurette Schimke Crook, [place], [place]

## 2014-12-05 NOTE — Progress Notes (Addendum)
Per MD OK to wean pt. Pt is currently only on a  small amounts of pressors with HR in the 130's-140's.

## 2014-12-06 ENCOUNTER — Inpatient Hospital Stay (HOSPITAL_COMMUNITY): Payer: Medicare Other

## 2014-12-06 LAB — GLUCOSE, CAPILLARY
GLUCOSE-CAPILLARY: 139 mg/dL — AB (ref 70–99)
GLUCOSE-CAPILLARY: 91 mg/dL (ref 70–99)
Glucose-Capillary: 132 mg/dL — ABNORMAL HIGH (ref 70–99)
Glucose-Capillary: 133 mg/dL — ABNORMAL HIGH (ref 70–99)
Glucose-Capillary: 142 mg/dL — ABNORMAL HIGH (ref 70–99)
Glucose-Capillary: 167 mg/dL — ABNORMAL HIGH (ref 70–99)

## 2014-12-06 LAB — CBC
HCT: 28.4 % — ABNORMAL LOW (ref 39.0–52.0)
HEMOGLOBIN: 9.2 g/dL — AB (ref 13.0–17.0)
MCH: 29.4 pg (ref 26.0–34.0)
MCHC: 32.4 g/dL (ref 30.0–36.0)
MCV: 90.7 fL (ref 78.0–100.0)
Platelets: 203 10*3/uL (ref 150–400)
RBC: 3.13 MIL/uL — ABNORMAL LOW (ref 4.22–5.81)
RDW: 15.5 % (ref 11.5–15.5)
WBC: 19.5 10*3/uL — ABNORMAL HIGH (ref 4.0–10.5)

## 2014-12-06 LAB — RENAL FUNCTION PANEL
ALBUMIN: 2.1 g/dL — AB (ref 3.5–5.0)
Anion gap: 8 (ref 5–15)
BUN: 54 mg/dL — ABNORMAL HIGH (ref 6–20)
CO2: 26 mmol/L (ref 22–32)
Calcium: 7.9 mg/dL — ABNORMAL LOW (ref 8.9–10.3)
Chloride: 101 mmol/L (ref 101–111)
Creatinine, Ser: 2.77 mg/dL — ABNORMAL HIGH (ref 0.61–1.24)
GFR calc non Af Amer: 23 mL/min — ABNORMAL LOW (ref >60)
GFR, EST AFRICAN AMERICAN: 26 mL/min — AB (ref >60)
Glucose, Bld: 137 mg/dL — ABNORMAL HIGH (ref 70–99)
PHOSPHORUS: 4.4 mg/dL (ref 2.5–4.6)
Potassium: 4.2 mmol/L (ref 3.5–5.1)
Sodium: 135 mmol/L (ref 135–145)

## 2014-12-06 LAB — BLOOD GAS, ARTERIAL
Acid-Base Excess: 2.4 mmol/L — ABNORMAL HIGH (ref 0.0–2.0)
BICARBONATE: 27.2 meq/L — AB (ref 20.0–24.0)
DRAWN BY: 35849
FIO2: 0.4 %
O2 SAT: 93.1 %
PATIENT TEMPERATURE: 98.6
PEEP: 5 cmH2O
PRESSURE SUPPORT: 5 cmH2O
TCO2: 25.5 mmol/L (ref 0–100)
pCO2 arterial: 45.7 mmHg — ABNORMAL HIGH (ref 35.0–45.0)
pH, Arterial: 7.392 (ref 7.350–7.450)
pO2, Arterial: 76.1 mmHg — ABNORMAL LOW (ref 80.0–100.0)

## 2014-12-06 LAB — MAGNESIUM: Magnesium: 2.7 mg/dL — ABNORMAL HIGH (ref 1.7–2.4)

## 2014-12-06 LAB — CULTURE, RESPIRATORY W GRAM STAIN

## 2014-12-06 LAB — CULTURE, RESPIRATORY

## 2014-12-06 LAB — APTT: aPTT: 56 seconds — ABNORMAL HIGH (ref 24–37)

## 2014-12-06 LAB — PROCALCITONIN: PROCALCITONIN: 1.29 ng/mL

## 2014-12-06 MED ORDER — HEPARIN SODIUM (PORCINE) 1000 UNIT/ML IJ SOLN
1000.0000 [IU] | INTRAMUSCULAR | Status: DC | PRN
  Administered 2014-12-07 (×2): 1200 [IU]
  Filled 2014-12-06 (×2): qty 1

## 2014-12-06 MED ORDER — HEPARIN BOLUS VIA INFUSION (CRRT)
1000.0000 [IU] | INTRAVENOUS | Status: DC | PRN
  Filled 2014-12-06: qty 1000

## 2014-12-06 MED ORDER — SODIUM CHLORIDE 0.9 % IJ SOLN
250.0000 [IU]/h | INTRAMUSCULAR | Status: DC
  Administered 2014-12-06: 1500 [IU]/h via INTRAVENOUS_CENTRAL
  Administered 2014-12-07: 1800 [IU]/h via INTRAVENOUS_CENTRAL
  Administered 2014-12-07 (×3): 1700 [IU]/h via INTRAVENOUS_CENTRAL
  Filled 2014-12-06 (×7): qty 2

## 2014-12-06 MED ORDER — CETYLPYRIDINIUM CHLORIDE 0.05 % MT LIQD
7.0000 mL | Freq: Two times a day (BID) | OROMUCOSAL | Status: DC
  Administered 2014-12-06 – 2014-12-18 (×22): 7 mL via OROMUCOSAL

## 2014-12-06 NOTE — Procedures (Signed)
Extubation Procedure Note  Patient Details:   Name: Allen Green DOB: 12-11-1950 MRN: [id number]   Pt extubated to 6L [place] per MD order. Pt able to vocalize, VS WNL, no stridor noted. Pt tolerating well at this time. RT will continue to monitor.    Evaluation  O2 sats: stable throughout Complications: No apparent complications Patient did tolerate procedure well. Bilateral Breath Sounds: Diminished Suctioning: Airway Yes  [name] 12/06/2014, 11:12 AM

## 2014-12-06 NOTE — Progress Notes (Signed)
PULMONARY / CRITICAL CARE MEDICINE   Name: Allen Green MRN: [id number] DOB: 10/14/1950    ADMISSION DATE:  11/02/2014 CONSULTATION DATE: 4/26  REFERRING MD :  [facility]  CHIEF COMPLAINT:  SOB  INITIAL PRESENTATION: 64 y/o male admitted on 4/26 with septic shock and acute respiratory failure due to cholecystitis and an abscess associated with a recently dislodged gallbladder drain. Course complicated by bibasal HCAP & ARDS range hypoxia & severe autoPEEP  STUDIES:  4/26 CT Abdomen> acute cholecystitis and gallbladder tract abscess 4/26 ECHO> poor study, can't comment on LV or RV size/function 4/29 CT abdomen >> Mildly prominent central small bowel loops in the lower abdomen and upper pelvis , decompressed gallbladder. 5/1 ven duplex neg bLE  SIGNIFICANT EVENTS: 4/26 intubated, Gallbladder drain placed by IR 4/29 multiple amio bolus for RVR 4/30 worsening hypoxia 5/3 improving pressor needs 5/4 SBTs   SUBJECTIVE:   Less Agitated , on  fent + versed gtt -on amio gtt, in nSR Afebrile, On CVVH   VITAL SIGNS: Temp:  [96.6 F (35.9 C)-98.1 F (36.7 C)] 97 F (36.1 C) (05/05 0700) Pulse Rate:  [49-148] 76 (05/05 0700) Resp:  [11-23] 22 (05/05 0700) SpO2:  [92 %-98 %] 95 % (05/05 0803) FiO2 (%):  [40 %] 40 % (05/05 0803) Weight:  [268 lb 1.3 oz (121.6 kg)] 268 lb 1.3 oz (121.6 kg) (05/05 0325) HEMODYNAMICS:   VENTILATOR SETTINGS: Vent Mode:  [-] PRVC FiO2 (%):  [40 %] 40 % Set Rate:  [20 bmp] 20 bmp Vt Set:  [500 mL] 500 mL PEEP:  [5 cmH20] 5 cmH20 Pressure Support:  [10 cmH20] 10 cmH20 Plateau Pressure:  [12 SAY30-16 cmH20] 28 cmH20 INTAKE / OUTPUT:  Intake/Output Summary (Last 24 hours) at 12/06/14 0843 Last data filed at 12/06/14 0800  Gross per 24 hour  Intake 2882.49 ml  Output   3796 ml  Net -913.51 ml    PHYSICAL EXAMINATION: General:  On vent, calmer HENT: NCAT, ETT in place, no jvd PULM: CTA B, autopeep + CV: Tachy, regular, no mgr GI: BS+, soft, drain  in place, large Derm: improved redness, clear bile GB drain Neuro: RASS 0, moves all 4Es  LABS:  CBC  Recent Labs Lab 12/04/14 0445 12/05/14 0406 12/06/14 0536  WBC 29.7* 22.0* 19.5*  HGB 10.4* 9.5* 9.2*  HCT 30.2* 28.1* 28.4*  PLT 143* 197 203   Coag's  Recent Labs Lab 12/05/14 0357 12/05/14 0750 12/05/14 1140  APTT 73* 60* 50*   BMET  Recent Labs Lab 12/04/14 0445 12/04/14 1556 12/05/14 1600  NA 131* 135 136  K 4.4 4.2 3.7  CL 88* 91* 93*  CO2 30 32 33*  BUN 27* 31* 49*  CREATININE 3.11* 2.85* 2.72*  GLUCOSE 168* 167* 152*   Electrolytes  Recent Labs Lab 12/03/14 1600 12/04/14 0445 12/04/14 1556 12/05/14 0406 12/05/14 1600 12/06/14 0536  CALCIUM 7.8* 8.1* 8.3*  --  8.1*  --   MG  --  2.4  --  2.4  --  2.7*  PHOS 4.5  --  4.1  --  3.7  --    Sepsis Markers  Recent Labs Lab 11/30/14 1010 12/01/14 0430 12/05/14 0406 12/06/14 0536  LATICACIDVEN 2.1*  --   --   --   PROCALCITON  --  26.51 2.25 1.29   ABG  Recent Labs Lab 12/02/14 0748 12/03/14 0522 12/04/14 0530  PHART 7.339* 7.375 7.382  PCO2ART 66.7* 58.6* 49.5*  PO2ART 77.7* 89.2 136*  Liver Enzymes  Recent Labs Lab 12/03/14 1600 12/04/14 1556 12/05/14 1600  ALBUMIN 2.1* 2.2* 2.1*   Cardiac Enzymes No results for input(s): TROPONINI, PROBNP in the last 168 hours. Glucose  Recent Labs Lab 12/05/14 1154 12/05/14 1617 12/05/14 1952 12/06/14 0008 12/06/14 0345 12/06/14 0806  GLUCAP 168* 183* 152* 167* 139* 133*    Imaging CXR 5/5 bibasal pna/ effusions    ASSESSMENT / PLAN:  PULMONARY OETT 4/26(PCCM)>> A: Acute respiratory failure with hypercapnea> improved 4/27, passing SBT Severe COPD, but doing surprisingly well on 4/27 AM, so an elective Ex-lap as outpatient for cholecystectomy not unreasonable Tobacco abuse BLL HCAP 5/1 Worse hypoxia due to ? Vent asynchrony, doubt PE -improved P:   SBTs Vent bundle BD's Keep RR to 20 to avoid  autopeep Family  would like to avoid trach here   CARDIOVASCULAR CVL 4/26 L IJ CVL>> A:  Septic shock > worse 4/29, echo not helpful,? Cardiogenic component, more septic at this point -Repeat troponin>>neg AF-RVR P:  Levophed for MAP > 65 as needed ct vaso ct amio gtt -change to oral at some point if remains nSR  RENAL Lab Results  Component Value Date   CREATININE 2.72* 12/05/2014   CREATININE 2.85* 12/04/2014   CREATININE 3.11* 12/04/2014    Recent Labs Lab 12/04/14 0445 12/04/14 1556 12/05/14 1600  K 4.4 4.2 3.7      A: Oliguric AKI -on CVVH since 4/27 P:   Monitor BMET and UOP Renal following ct CVVH -OK to keep neg 50/h  GASTROINTESTINAL A:   Acute Cholecystis > with peri-gallbladder abscess, s/p drain 4/26 per IR Recent IR drain 4/3->4/12  Ileus S/p reglan  P:   Monitor drain output Tolerating  TFs  to 30/h    HEMATOLOGIC A:   Chronic polycythemia - now anemic P:  Follow H/H Monitor for bleeding  INFECTIOUS A:   Septic shock> worse 4/29 likely all related to draining abscess on 4/26 P:   BCx2  4/26>>bacteroides Sputum 4/26>>nl flora Wound culture 4/26 > bacteroides 4/26 c dif>>neg 5/3 resp cx >> candida Abx:  4/26 vanc>> 4/30 4/26 zoysn>>4/29 4/27 cipro>> 5/1 4/29 meropenem >> Pct decreasing - reassuring, can dc meropenem 5/9  ENDOCRINE CBG (last 3)   Recent Labs  12/06/14 0008 12/06/14 0345 12/06/14 0806  GLUCAP 167* 139* 133*     A:   No acute issue P:   SSI  NEUROLOGIC A:  Intact prior to intubation Severe agitation/ asynchrony  P:   RASS goal:0 Continue fentanyl per PAD protocol dc versed gtt ct seroquel 200 bid   FAMILY  - Updates: updated at length 5/4 AM -they would like to avoid trach if possible  - Inter-disciplinary family meet or Palliative Care meeting due by: 5/3  Summary -Resolving shock , SBTs with goale xtubation  The patient is critically ill with multiple organ systems failure and requires high  complexity decision making for assessment and support, frequent evaluation and titration of therapies, application of advanced monitoring technologies and extensive interpretation of multiple databases. Critical Care Time devoted to patient care services described in this note independent of APP time is 35 minutes.   Kara Mead MD. [place] Flood. [facility] Pager [phone] If no response call [phone]     12/06/2014, 8:43 AM

## 2014-12-06 NOTE — Progress Notes (Addendum)
ANTIBIOTIC CONSULT NOTE - FOLLOW UP  Pharmacy Consult for Meropenem Indication: Intra-abdominal infection, Gram negative bacteremia  No Known Allergies  Patient Measurements: Height: 5\' 11"  (180.3 cm) Weight: 268 lb 1.3 oz (121.6 kg) IBW/kg (Calculated) : 75.3  Vital Signs: Temp: 97 F (36.1 C) (05/05 0700) Temp Source: Core (Comment) (05/05 0200) Pulse Rate: 76 (05/05 0700) Intake/Output from previous day: 05/04 0701 - 05/05 0700 In: 2900.7 [I.V.:1521.7; NG/GT:943; IV Piggyback:200] Out: 6294 [Urine:6; Drains:90] Intake/Output from this shift:    Labs:  Recent Labs  12/04/14 0445 12/04/14 1556 12/05/14 0406 12/05/14 1600 12/06/14 0536  WBC 29.7*  --  22.0*  --  19.5*  HGB 10.4*  --  9.5*  --  9.2*  PLT 143*  --  197  --  203  CREATININE 3.11* 2.85*  --  2.72*  --    Estimated Creatinine Clearance: 36.9 mL/min (by C-G formula based on Cr of 2.72). No results for input(s): VANCOTROUGH, VANCOPEAK, VANCORANDOM, GENTTROUGH, GENTPEAK, GENTRANDOM, TOBRATROUGH, TOBRAPEAK, TOBRARND, AMIKACINPEAK, AMIKACINTROU, AMIKACIN in the last 72 hours.   Microbiology: Recent Results (from the past 720 hour(s))  Blood culture (routine x 2)     Status: None   Collection Time: 11/27/14  1:45 AM  Result Value Ref Range Status   Specimen Description BLOOD L FA  Final   Special Requests BOTTLES DRAWN AEROBIC AND ANAEROBIC 5CC  Final   Culture   Final    BACTEROIDES THETAIOTAOMICRON Note: BETA LACTAMASE POSITIVE Note: Gram Stain Report Called to,Read Back By and Verified With: ALISON RN ON ICU AT 1651 [id number] BY [facility] Performed at [facility]    Report Status 11/30/2014 FINAL  Final  Blood culture (routine x 2)     Status: None   Collection Time: 11/27/14  1:50 AM  Result Value Ref Range Status   Specimen Description BLOOD LHAND  Final   Special Requests BOTTLES DRAWN AEROBIC AND ANAEROBIC 5CC  Final   Culture   Final    BACTEROIDES THETAIOTAOMICRON Note: BETA LACTAMASE  POSITIVE Note: Gram Stain Report Called to,Read Back By and Verified With: Charmayne Sheer @ [id number] ON [id number] BY [facility] Performed at [facility]    Report Status 11/30/2014 FINAL  Final  MRSA PCR Screening     Status: None   Collection Time: 11/27/14  9:25 AM  Result Value Ref Range Status   MRSA by PCR NEGATIVE NEGATIVE Final    Comment:        The GeneXpert MRSA Assay (FDA approved for NASAL specimens only), is one component of a comprehensive MRSA colonization surveillance program. It is not intended to diagnose MRSA infection nor to guide or monitor treatment for MRSA infections.   Culture, respiratory (NON-Expectorated)     Status: None   Collection Time: 11/27/14 10:56 AM  Result Value Ref Range Status   Specimen Description TRACHEAL ASPIRATE  Final   Special Requests Normal  Final   Gram Stain   Final    FEW WBC PRESENT,BOTH PMN AND MONONUCLEAR RARE SQUAMOUS EPITHELIAL CELLS PRESENT RARE GRAM POSITIVE COCCI IN PAIRS Performed at [facility]    Culture   Final    Non-Pathogenic Oropharyngeal-type Flora Isolated. Performed at [facility]    Report Status 11/29/2014 FINAL  Final  Body fluid culture     Status: None   Collection Time: 11/27/14  5:06 PM  Result Value Ref Range Status   Specimen Description GALL BLADDER  Final   Special Requests NONE  Final   Gram Stain   Final    RARE WBC PRESENT,BOTH PMN AND MONONUCLEAR FEW GRAM NEGATIVE RODS RARE GRAM POSITIVE COCCI IN PAIRS Gram Stain Report Called to,Read Back By and Verified With: Gram Stain Report Called to,Read Back By and Verified With: Charmayne Sheer RN 11/28/14 8:35AM BY [place] Performed at [facility]    Culture   Final    MULTIPLE ORGANISMS PRESENT, NONE PREDOMINANT Note: NO STAPHYLOCOCCUS AUREUS ISOLATED NO GROUP A STREP (S.PYOGENES) ISOLATED Performed at [facility]    Report Status 11/30/2014 FINAL  Final  Clostridium Difficile by PCR     Status: None    Collection Time: 11/27/14  8:54 PM  Result Value Ref Range Status   C difficile by pcr NEGATIVE NEGATIVE Final  Culture, respiratory (NON-Expectorated)     Status: None   Collection Time: 12/04/14  9:27 AM  Result Value Ref Range Status   Specimen Description TRACHEAL ASPIRATE  Final   Special Requests NONE  Final   Gram Stain   Final    ABUNDANT WBC PRESENT, PREDOMINANTLY PMN NO SQUAMOUS EPITHELIAL CELLS SEEN NO ORGANISMS SEEN RARE YEAST Performed at [facility]    Culture   Final    FEW CANDIDA ALBICANS Performed at [facility]    Report Status 12/06/2014 FINAL  Final    Anti-infectives    Start     Dose/Rate Route Frequency Ordered Stop   11/30/14 1200  meropenem (MERREM) 1 g in sodium chloride 0.9 % 100 mL IVPB     1 g 200 mL/hr over 30 Minutes Intravenous Every 12 hours 11/30/14 1124     11/30/14 0930  vancomycin (VANCOCIN) 1,250 mg in sodium chloride 0.9 % 250 mL IVPB     1,250 mg 166.7 mL/hr over 90 Minutes Intravenous  Once 11/30/14 0829 11/30/14 1100   11/28/14 2000  piperacillin-tazobactam (ZOSYN) IVPB 2.25 g  Status:  Discontinued     2.25 g 100 mL/hr over 30 Minutes Intravenous Every 8 hours 11/28/14 1357 11/28/14 1853   11/28/14 2000  piperacillin-tazobactam (ZOSYN) IVPB 3.375 g  Status:  Discontinued     3.375 g 100 mL/hr over 30 Minutes Intravenous Every 6 hours 11/28/14 1854 11/30/14 1101   11/28/14 1930  ciprofloxacin (CIPRO) IVPB 400 mg  Status:  Discontinued     400 mg 200 mL/hr over 60 Minutes Intravenous Every 12 hours 11/28/14 1851 12/02/14 1543   11/27/14 1700  vancomycin (VANCOCIN) IVPB 750 mg/150 ml premix  Status:  Discontinued     750 mg 150 mL/hr over 60 Minutes Intravenous Every 12 hours 11/27/14 0640 11/28/14 1141   11/27/14 1000  piperacillin-tazobactam (ZOSYN) IVPB 3.375 g  Status:  Discontinued     3.375 g 12.5 mL/hr over 240 Minutes Intravenous Every 8 hours 11/27/14 0435 11/28/14 1357   11/27/14 0130  vancomycin  (VANCOCIN) 2,000 mg in sodium chloride 0.9 % 500 mL IVPB     2,000 mg 250 mL/hr over 120 Minutes Intravenous  Once 11/27/14 0120 11/27/14 0643   11/27/14 0130  piperacillin-tazobactam (ZOSYN) IVPB 3.375 g     3.375 g 100 mL/hr over 30 Minutes Intravenous  Once 11/27/14 0120 11/27/14 0227      Assessment: 64 y/o male admitted on 4/26 with septic shock and acute respiratory failure due to cholecystitis and an abscess associated with a recently dislodged gallbladder drain.  Patient was placed on CRRT 4/27 and Pharmacy has been consulted to manage antibiotic dosing since then.  4/26 >> Vanc >> 4/30  4/26 >> Zosyn >> 4/29  4/27 >> Cipro>> 5/1  4/29 >> Meropenem >>  4/26 blood x2: Bacteroides thetaiotaomicron BL+  4/26 trach asp: normal flora-final  4/26 gall bladder fluid: polymicrobial-final 4/26 MRSA PCR: negative 4/26 C.diff PCR: negative 5/3 trach asp: few candida albicans  Today, 12/06/2014, Day #10 antibiotics (Day #7 Meropenem) Tmax: 98.2 WBC: remain elevated at 19.5K (on stress-dose hydrocortisone) Renal: CRRT continues, minimal UOP, SCr slightly improved to 2.72 PCT: 56.94 (4/28) > 26.51 (4/30) > 2.25 (5/4) > 1.29 (5/5)  Goal of Therapy:  Eradication of infection Antibiotic doses appropriate for CRRT  Plan:   Continue meropenem 1g IV q12h  Continue to follow renal function, tolerance of CRRT  Follow up plan for duration of therapy   Lindell Spar, PharmD, BCPS Pager: [phone] 12/06/2014 7:54 AM

## 2014-12-06 NOTE — Progress Notes (Signed)
  [facility] Progress Note   Subjective: heparin stopped by accident yesterday, clotted 2 filters overnight. Extubated today and doing better. Pressors down.   Filed Vitals:   12/06/14 1100 12/06/14 1105 12/06/14 1200 12/06/14 1452  BP:  153/49    Pulse: 75 105 48   Temp: 97.9 F (36.6 C)  98.1 F (36.7 C)   TempSrc:      Resp: 13 14 14    Height:      Weight:      SpO2: 95% 94% 95% 96%   Exam: Extubated, alert No jvd Chest clear ant and lat RRR no rub/ gallop Abd obese, nondistended Mild-moderate UE/LE edema Neuro follows commands  UNa 51, UCr 136 UA  gran casts, amber, 100 prot, 1.022, 11-20 wbc, no rbc CXR no chg bibasilar airspace dz        Assessment: 1. AKI / ATN - from sepsis/ contrast, oliguric. Cont CRRT, day #9 2. Resp acidosis - prob chronic, no chg 3. VDRF 4. Sepsis - possible gallbladder abscess/ cholecystitis, possible PNA - remains septic but pressors coming down 5. Volume excess - moderate, CVP in range  Plan - continue CRRT, restart hep gtt, cont to remove 50-75 cc/hr as tolerated if SBP > 100    Kelly Splinter MD  pager [phone]    cell [phone]  12/06/2014, 3:18 PM     Recent Labs Lab 12/03/14 1600 12/04/14 0445 12/04/14 1556 12/05/14 1600  NA 127* 131* 135 136  K 4.4 4.4 4.2 3.7  CL 87* 88* 91* 93*  CO2 31 30 32 33*  GLUCOSE 153* 168* 167* 152*  BUN 26* 27* 31* 49*  CREATININE 3.05* 3.11* 2.85* 2.72*  CALCIUM 7.8* 8.1* 8.3* 8.1*  PHOS 4.5  --  4.1 3.7    Recent Labs Lab 12/03/14 1600 12/04/14 1556 12/05/14 1600  ALBUMIN 2.1* 2.2* 2.1*    Recent Labs Lab 12/04/14 0445 12/05/14 0406 12/06/14 0536  WBC 29.7* 22.0* 19.5*  HGB 10.4* 9.5* 9.2*  HCT 30.2* 28.1* 28.4*  MCV 89.3 89.2 90.7  PLT 143* 197 203   . antiseptic oral rinse  7 mL Mouth Rinse QID  . chlorhexidine  15 mL Mouth Rinse BID  . feeding supplement (PRO-STAT SUGAR FREE 64)  30 mL Per Tube TID  . feeding supplement (VITAL HIGH PROTEIN)   1,000 mL Per Tube Q24H  . fentaNYL (SUBLIMAZE) injection  50 mcg Intravenous Once  . heparin subcutaneous  5,000 Units Subcutaneous 3 times per day  . hydrocortisone sod succinate (SOLU-CORTEF) inj  50 mg Intravenous Q6H  . ipratropium-albuterol  3 mL Nebulization Q6H  . meropenem (MERREM) IV  1 g Intravenous Q12H  . pantoprazole (PROTONIX) IV  40 mg Intravenous Q24H  . QUEtiapine  200 mg Oral BID  . sodium chloride  3 mL Intravenous Q12H   . sodium chloride 10 mL/hr at 12/06/14 1300  . sodium chloride 10 mL/hr at 12/06/14 1300  . amiodarone 30 mg/hr (12/06/14 1300)  . norepinephrine (LEVOPHED) Adult infusion 2 mcg/min (12/06/14 1312)  . dialysis replacement fluid (prismasate) 400 mL/hr at 12/05/14 2225  . dialysis replacement fluid (prismasate) 200 mL/hr at 12/05/14 2225  . dialysate (PRISMASATE) 1,500 mL/hr at 12/06/14 0515  . vasopressin (PITRESSIN) infusion - *FOR SHOCK* 0.03 Units/min (12/06/14 1300)   acetaminophen **OR** acetaminophen, docusate, fentaNYL, fentaNYL (SUBLIMAZE) injection, heparin, heparin, heparin, heparin, midazolam, ondansetron **OR** ondansetron (ZOFRAN) IV

## 2014-12-06 NOTE — Progress Notes (Signed)
NUTRITION FOLLOW UP  Intervention:   Diet advancement per MD.   RD will continue to monitor for need for nutritional supplements.   Nutrition Dx:   Inadequate oral intake related to inability to eat as evidenced by NPO.   Goal:   Pt to meet >/= 90% of their estimated nutrition needs; not met  Monitor:   Weight trend, diet advancement, labs  Assessment:   64 year old Caucasian man with past medical history significant for COPD, GERD and a recently discharged gallbladder drain with development of an abscess and septic shock/acute respiratory failure who presented to the hospital yesterday. Had a CT scan of his abdomen/pelvis with intravenous contrast yesterday to study his area of dislodged gallbladder drain better prior to undergoing decompression of subcutaneous abscess and new drain placement in gallbladder.   - Drain replacement 4/26 - No signs of fat or muscle depletion - Pt on CRRT   - Pt extubated 5/5. Diet has not been advanced. Per RN, may try clears soon.  - Pt in + 5.1 L since admission. Weight 245 lbs on 4/12. Currently 268 lbs.  - RD will continue to monitor and assess for need for nutritional supplements.  - Labs and medications reviewed  Mg 2.7  Albumin 2.1  Height: Ht Readings from Last 1 Encounters:  11/27/14 $RemoveB'5\' 11"'gRjJoVYJ$  (1.803 m)    Weight Status:   Wt Readings from Last 1 Encounters:  12/06/14 268 lb 1.3 oz (121.6 kg)    Re-estimated needs:  Kcal: 2300-2500 Protein: >151 g Fluid: 2.3-2.5  Skin: intact  Diet Order: Diet NPO time specified   Intake/Output Summary (Last 24 hours) at 12/06/14 1249 Last data filed at 12/06/14 1200  Gross per 24 hour  Intake 2629.83 ml  Output   3807 ml  Net -1177.17 ml    Last BM: 5/5   Labs:   Recent Labs Lab 12/03/14 1600 12/04/14 0445 12/04/14 1556 12/05/14 0406 12/05/14 1600 12/06/14 0536  NA 127* 131* 135  --  136  --   K 4.4 4.4 4.2  --  3.7  --   CL 87* 88* 91*  --  93*  --   CO2 31 30 32  --   33*  --   BUN 26* 27* 31*  --  49*  --   CREATININE 3.05* 3.11* 2.85*  --  2.72*  --   CALCIUM 7.8* 8.1* 8.3*  --  8.1*  --   MG  --  2.4  --  2.4  --  2.7*  PHOS 4.5  --  4.1  --  3.7  --   GLUCOSE 153* 168* 167*  --  152*  --     CBG (last 3)   Recent Labs  12/06/14 0008 12/06/14 0345 12/06/14 0806  GLUCAP 167* 139* 133*    Scheduled Meds: . antiseptic oral rinse  7 mL Mouth Rinse QID  . chlorhexidine  15 mL Mouth Rinse BID  . feeding supplement (PRO-STAT SUGAR FREE 64)  30 mL Per Tube TID  . feeding supplement (VITAL HIGH PROTEIN)  1,000 mL Per Tube Q24H  . fentaNYL (SUBLIMAZE) injection  50 mcg Intravenous Once  . heparin subcutaneous  5,000 Units Subcutaneous 3 times per day  . hydrocortisone sod succinate (SOLU-CORTEF) inj  50 mg Intravenous Q6H  . ipratropium-albuterol  3 mL Nebulization Q6H  . meropenem (MERREM) IV  1 g Intravenous Q12H  . pantoprazole (PROTONIX) IV  40 mg Intravenous Q24H  . QUEtiapine  200 mg  Oral BID  . sodium chloride  3 mL Intravenous Q12H    Continuous Infusions: . sodium chloride 10 mL/hr at 12/06/14 1100  . sodium chloride 10 mL/hr at 12/06/14 1100  . amiodarone 30 mg/hr (12/06/14 1100)  . norepinephrine (LEVOPHED) Adult infusion Stopped (12/06/14 1235)  . dialysis replacement fluid (prismasate) 400 mL/hr at 12/05/14 2225  . dialysis replacement fluid (prismasate) 200 mL/hr at 12/05/14 2225  . dialysate (PRISMASATE) 1,500 mL/hr at 12/06/14 0515  . vasopressin (PITRESSIN) infusion - *FOR SHOCK* 0.03 Units/min (12/06/14 1100)    Laurette Schimke [place], [place], [place]

## 2014-12-07 ENCOUNTER — Inpatient Hospital Stay (HOSPITAL_COMMUNITY): Payer: Medicare Other

## 2014-12-07 LAB — GLUCOSE, CAPILLARY
GLUCOSE-CAPILLARY: 114 mg/dL — AB (ref 70–99)
GLUCOSE-CAPILLARY: 115 mg/dL — AB (ref 70–99)
GLUCOSE-CAPILLARY: 87 mg/dL (ref 70–99)
Glucose-Capillary: 108 mg/dL — ABNORMAL HIGH (ref 70–99)
Glucose-Capillary: 109 mg/dL — ABNORMAL HIGH (ref 70–99)
Glucose-Capillary: 118 mg/dL — ABNORMAL HIGH (ref 70–99)
Glucose-Capillary: 86 mg/dL (ref 70–99)

## 2014-12-07 LAB — CBC
HCT: 27.3 % — ABNORMAL LOW (ref 39.0–52.0)
Hemoglobin: 9.3 g/dL — ABNORMAL LOW (ref 13.0–17.0)
MCH: 30.6 pg (ref 26.0–34.0)
MCHC: 34.1 g/dL (ref 30.0–36.0)
MCV: 89.8 fL (ref 78.0–100.0)
PLATELETS: 237 10*3/uL (ref 150–400)
RBC: 3.04 MIL/uL — ABNORMAL LOW (ref 4.22–5.81)
RDW: 15.6 % — ABNORMAL HIGH (ref 11.5–15.5)
WBC: 20.4 10*3/uL — ABNORMAL HIGH (ref 4.0–10.5)

## 2014-12-07 LAB — MAGNESIUM: Magnesium: 2.8 mg/dL — ABNORMAL HIGH (ref 1.7–2.4)

## 2014-12-07 LAB — APTT
APTT: 59 s — AB (ref 24–37)
aPTT: 59 seconds — ABNORMAL HIGH (ref 24–37)
aPTT: 84 seconds — ABNORMAL HIGH (ref 24–37)
aPTT: 63 seconds — ABNORMAL HIGH (ref 24–37)
aPTT: 33 seconds (ref 24–37)

## 2014-12-07 LAB — PROCALCITONIN: Procalcitonin: 0.69 ng/mL

## 2014-12-07 MED ORDER — ALBUMIN HUMAN 25 % IV SOLN
25.0000 g | Freq: Four times a day (QID) | INTRAVENOUS | Status: DC | PRN
  Filled 2014-12-07: qty 100

## 2014-12-07 MED ORDER — FENTANYL CITRATE (PF) 100 MCG/2ML IJ SOLN
25.0000 ug | INTRAMUSCULAR | Status: DC | PRN
  Administered 2014-12-11 – 2014-12-18 (×2): 25 ug via INTRAVENOUS
  Filled 2014-12-07: qty 2

## 2014-12-07 MED ORDER — RESOURCE THICKENUP CLEAR PO POWD
ORAL | Status: DC | PRN
  Filled 2014-12-07: qty 125

## 2014-12-07 MED ORDER — HYDROCORTISONE NA SUCCINATE PF 100 MG IJ SOLR
50.0000 mg | Freq: Two times a day (BID) | INTRAMUSCULAR | Status: AC
  Administered 2014-12-07 – 2014-12-10 (×6): 50 mg via INTRAVENOUS
  Filled 2014-12-07 (×2): qty 1
  Filled 2014-12-07 (×4): qty 2

## 2014-12-07 NOTE — Progress Notes (Addendum)
  [facility] Progress Note   Subjective: temp cath positional , wt down 116kg, I/O neg 1 L  Filed Vitals:   12/07/14 1031 12/07/14 1200 12/07/14 1457 12/07/14 1500  BP:      Pulse: 103   105  Temp:  97.8 F (36.6 C)    TempSrc:      Resp: 30   16  Height:      Weight:      SpO2: 93%  98%    Exam: Extubated, alert No jvd Chest exp wheezing RRR no rub/ gallop Abd obese, nondistended Mild-moderate diffuse edema Neuro follows commands  UNa 51, UCr 136 UA  gran casts, amber, 100 prot, 1.022, 11-20 wbc, no rbc CXR no chg bibasilar airspace dz        Assessment: 1. AKI / ATN - from sepsis/ contrast, oliguric 2. Bacteroides sepsis/ gallbladder infection, s/p chole tube - off pressors, on meropenem 3. Volume excess - moderate, IS edema on CXR remains 4. Shock - resolved  Plan - would like to dc CRRT but concerned about vol excess and resp status, will cont for now and ^ UF 100-125 cc/ hr, use albumin prn for low BP's    Kelly Splinter MD  pager [phone]    cell [phone]  12/07/2014, 3:51 PM     Recent Labs Lab 12/04/14 1556 12/05/14 1600 12/06/14 1615  NA 135 136 135  K 4.2 3.7 4.2  CL 91* 93* 101  CO2 32 33* 26  GLUCOSE 167* 152* 137*  BUN 31* 49* 54*  CREATININE 2.85* 2.72* 2.77*  CALCIUM 8.3* 8.1* 7.9*  PHOS 4.1 3.7 4.4    Recent Labs Lab 12/04/14 1556 12/05/14 1600 12/06/14 1615  ALBUMIN 2.2* 2.1* 2.1*    Recent Labs Lab 12/05/14 0406 12/06/14 0536 12/07/14 0520  WBC 22.0* 19.5* 20.4*  HGB 9.5* 9.2* 9.3*  HCT 28.1* 28.4* 27.3*  MCV 89.2 90.7 89.8  PLT 197 203 237   . antiseptic oral rinse  7 mL Mouth Rinse BID  . heparin subcutaneous  5,000 Units Subcutaneous 3 times per day  . hydrocortisone sod succinate (SOLU-CORTEF) inj  50 mg Intravenous Q12H  . ipratropium-albuterol  3 mL Nebulization Q6H  . meropenem (MERREM) IV  1 g Intravenous Q12H  . pantoprazole (PROTONIX) IV  40 mg Intravenous Q24H  . sodium chloride  3 mL  Intravenous Q12H   . sodium chloride 10 mL/hr at 12/07/14 0800  . sodium chloride 10 mL/hr at 12/07/14 0800  . amiodarone 30 mg/hr (12/07/14 1500)  . heparin 10,000 units/ 20 mL infusion syringe 1,700 Units/hr (12/07/14 1413)  . dialysis replacement fluid (prismasate) 400 mL/hr at 12/07/14 1323  . dialysis replacement fluid (prismasate) 200 mL/hr at 12/07/14 1331  . dialysate (PRISMASATE) 1,500 mL/hr at 12/07/14 1435   acetaminophen **OR** acetaminophen, docusate, fentaNYL (SUBLIMAZE) injection, heparin, heparin, heparin, heparin, ondansetron **OR** ondansetron (ZOFRAN) IV

## 2014-12-07 NOTE — Progress Notes (Addendum)
PT Cancellation Note  Patient Details Name: Allen Green MRN: [id number] DOB: Jul 23, 1951   Cancelled Treatment:    Reason Eval/Treat Not Completed: Patient not medically ready-on CVVH. will check back another day. thanks.    Weston Anna, MPT Pager: [phone]

## 2014-12-07 NOTE — Progress Notes (Signed)
1645-  CRT dc'd per Dr. Jonnie Finner catheter positional , not able to clear alarms on Prismaflex .  Blood not returned due to clot in [place]. Ports clamped and flushed with 1.2 cc of Heparin.

## 2014-12-07 NOTE — Progress Notes (Signed)
PULMONARY / CRITICAL CARE MEDICINE   Name: Allen Green MRN: [id number] DOB: Nov 10, 1950    ADMISSION DATE:  11/22/2014 CONSULTATION DATE: 4/26  REFERRING MD :  [facility]  CHIEF COMPLAINT:  SOB  INITIAL PRESENTATION: 64 y/o male admitted on 4/26 with septic shock and acute respiratory failure due to cholecystitis and an abscess associated with a recently dislodged gallbladder drain. Course complicated by bibasal HCAP & ARDS range hypoxia & severe autoPEEP  STUDIES:  4/26 CT Abdomen> acute cholecystitis and gallbladder tract abscess 4/26 ECHO> poor study, can't comment on LV or RV size/function 4/29 CT abdomen >> Mildly prominent central small bowel loops in the lower abdomen and upper pelvis , decompressed gallbladder. 5/1 ven duplex neg bLE  SIGNIFICANT EVENTS: 4/26 intubated, Gallbladder drain placed by IR 4/29 multiple amio bolus for RVR 4/30 worsening hypoxia 5/3 improving pressor needs 5/4 SBTs 5/5 extubated   SUBJECTIVE:   Did not sleep overnight Denies pain -on amio gtt, in nSR Afebrile, On CVVH   VITAL SIGNS: Temp:  [96.3 F (35.7 C)-98.1 F (36.7 C)] 97 F (36.1 C) (05/06 0700) Pulse Rate:  [45-105] 80 (05/06 0700) Resp:  [9-23] 14 (05/06 0700) BP: (153-156)/(49-54) 153/49 mmHg (05/05 1105) SpO2:  [93 %-100 %] 93 % (05/06 0700) FiO2 (%):  [40 %] 40 % (05/05 0850) HEMODYNAMICS:   VENTILATOR SETTINGS: Vent Mode:  [-] PSV;CPAP FiO2 (%):  [40 %] 40 % PEEP:  [5 cmH20] 5 cmH20 Pressure Support:  [5 cmH20] 5 cmH20 INTAKE / OUTPUT:  Intake/Output Summary (Last 24 hours) at 12/07/14 0802 Last data filed at 12/07/14 0700  Gross per 24 hour  Intake 1843.97 ml  Output   2752 ml  Net -908.03 ml    PHYSICAL EXAMINATION: General:  Chr ill appearing,calmer HENT: NCAT, [place] in place, no jvd PULM: CTA B CV: Tachy, regular, no mgr GI: BS+, soft, drain in place, large Derm: improved redness, clear bile GB drain Neuro: RASS 0, moves all 4Es, oriented x  2  LABS:  CBC  Recent Labs Lab 12/05/14 0406 12/06/14 0536 12/07/14 0520  WBC 22.0* 19.5* 20.4*  HGB 9.5* 9.2* 9.3*  HCT 28.1* 28.4* 27.3*  PLT 197 203 237   Coag's  Recent Labs Lab 12/06/14 2122 12/07/14 0045 12/07/14 0355  APTT 56* 59* 59*   BMET  Recent Labs Lab 12/04/14 1556 12/05/14 1600 12/06/14 1615  NA 135 136 135  K 4.2 3.7 4.2  CL 91* 93* 101  CO2 32 33* 26  BUN 31* 49* 54*  CREATININE 2.85* 2.72* 2.77*  GLUCOSE 167* 152* 137*   Electrolytes  Recent Labs Lab 12/04/14 1556 12/05/14 0406 12/05/14 1600 12/06/14 0536 12/06/14 1615 12/07/14 0520  CALCIUM 8.3*  --  8.1*  --  7.9*  --   MG  --  2.4  --  2.7*  --  2.8*  PHOS 4.1  --  3.7  --  4.4  --    Sepsis Markers  Recent Labs Lab 11/30/14 1010  12/05/14 0406 12/06/14 0536 12/07/14 0520  LATICACIDVEN 2.1*  --   --   --   --   PROCALCITON  --   < > 2.25 1.29 0.69  < > = values in this interval not displayed. ABG  Recent Labs Lab 12/03/14 0522 12/04/14 0530 12/06/14 0930  PHART 7.375 7.382 7.392  PCO2ART 58.6* 49.5* 45.7*  PO2ART 89.2 136* 76.1*   Liver Enzymes  Recent Labs Lab 12/04/14 1556 12/05/14 1600 12/06/14 1615  ALBUMIN 2.2*  2.1* 2.1*   Cardiac Enzymes No results for input(s): TROPONINI, PROBNP in the last 168 hours. Glucose  Recent Labs Lab 12/06/14 0806 12/06/14 1240 12/06/14 1624 12/06/14 1950 12/06/14 2354 12/07/14 0415  GLUCAP 133* 142* 132* 91 115* 108*    Imaging CXR 5/5 bibasal pna/ effusions    ASSESSMENT / PLAN:  PULMONARY OETT 4/26(PCCM)>>5/5 A: Acute respiratory failure with hypercapnea> improved 4/27, passing SBT Severe COPD, but doing surprisingly well on 4/27 AM, so an elective Ex-lap as outpatient for cholecystectomy not unreasonable Tobacco abuse BLL HCAP 5/1 Worse hypoxia due to ? Vent asynchrony, doubt PE -improved P:   BD's Mobilise   CARDIOVASCULAR CVL 4/26 L IJ CVL>> A:  Septic shock > worse 4/29, echo not  helpful,? Cardiogenic component, more septic at this point -Repeat troponin>>neg AF-RVR P:  Pressors off ct amio gtt -change to oral in 24h  if remains nSR  RENAL Lab Results  Component Value Date   CREATININE 2.77* 12/06/2014   CREATININE 2.72* 12/05/2014   CREATININE 2.85* 12/04/2014    Recent Labs Lab 12/04/14 1556 12/05/14 1600 12/06/14 1615  K 4.2 3.7 4.2      A: Oliguric AKI -on CVVH since 4/27 P:   Monitor BMET and UOP Renal following ct CVVH -OK to keep more neg 50/h, May need transfer to [facility] if iHD desired  GASTROINTESTINAL A:   Acute Cholecystis > with peri-gallbladder abscess, s/p drain 4/26 per IR Recent IR drain 4/3->4/12  Ileus S/p reglan  P:   Monitor drain output Advance per swallow    HEMATOLOGIC A:   Chronic polycythemia - now anemic P:  Follow H/H Monitor for bleeding  INFECTIOUS A:   Septic shock> worse 4/29 likely all related to draining abscess on 4/26 P:   BCx2  4/26>>bacteroides Sputum 4/26>>nl flora Wound culture 4/26 > bacteroides 4/26 c dif>>neg 5/3 resp cx >> candida Abx:  4/26 vanc>> 4/30 4/26 zoysn>>4/29 4/27 cipro>> 5/1 4/29 meropenem >> Pct decreasing - reassuring, can dc meropenem 5/9  ENDOCRINE CBG (last 3)   Recent Labs  12/06/14 1950 12/06/14 2354 12/07/14 0415  GLUCAP 91 115* 108*     A:   No acute issue P:   SSI  NEUROLOGIC A:  Intact prior to intubation Severe agitation/ asynchrony  P:   RASS goal:0 fent prn  dc seroquel  PT consult   FAMILY  - Updates: updated at length 5/5   - Inter-disciplinary family meet or Palliative Care meeting due by: NA  Summary -Resolved shock & organ failure, hope for renal recovery here  The patient is critically ill with multiple organ systems failure and requires high complexity decision making for assessment and support, frequent evaluation and titration of therapies, application of advanced monitoring technologies and extensive interpretation of  multiple databases. Critical Care Time devoted to patient care services described in this note independent of APP time is 35 minutes.   Kara Mead MD. [place] Flood. [facility] Pager [phone] If no response call [phone]     12/07/2014, 8:02 AM

## 2014-12-07 NOTE — Evaluation (Signed)
Clinical/Bedside Swallow Evaluation Patient Details  Name: Allen Green MRN: [id number] Date of Birth: October 16, 1950  Today's Date: 12/07/2014 Time: SLP Start Time (ACUTE ONLY): 32 SLP Stop Time (ACUTE ONLY): 1500 SLP Time Calculation (min) (ACUTE ONLY): 34 min  Past Medical History:  Past Medical History  Diagnosis Date  . COPD (chronic obstructive pulmonary disease)     Probable - long-standing tobacco abuse  . Chronic pain     Bilateral knees and shoulders  . GERD (gastroesophageal reflux disease)   . 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 [name]  . Pneumonia     January 2015  . 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 Right total knee arthroplasty  2003  . Left total knee arthroplasty  1998    "left knee replacement is broken" - 08/09/13   HPI:  64 y/o male admitted on 4/26 with septic shock and acute respiratory failure due to cholecystitis and an abscess associated with a recently dislodged gallbladder drain.  Pt intubated 4/26-12/06/14.  Swallow evaluation ordered.   Assessment / Plan / Recommendation Clinical Impression  Possible/probable aspiration of thin liquids present suspected due to possible pharyngeal/laryngeal edema characterized by immediate cough with approximately 80% of thin liquid boluses.  Poor positioning due to pt dialysis likely results in premature spillage of liquids into pharynx/larynx.  Pt with good tolerance of solid, puree, nectar and ice consistencies.  He does benefit from verbal cues to swallow due to oral holding/fatigue.    Recommend intiate a dys3/nectar diet - allowing ice and popsicles.   SLP educated pt to clinical reasoning for diet modification and aspiration precautions.  SLP to follow up for readiness for dietary advancement with medical improvement.      Aspiration Risk  Mild    Diet Recommendation Dysphagia 3 (Mech soft);Nectar (ice/popsicles)   Medication  Administration: Whole meds with puree    Other  Recommendations Oral Care Recommendations: Oral care BID Other Recommendations: Order thickener from pharmacy   Follow Up Recommendations       Frequency and Duration    1 week   Pertinent Vitals/Pain Afebrile, decreased- strong cough      Swallow Study Prior Functional Status   see hhx- pt admits to occasional issues with coughing on solids and home prior to admit and reflux issues    General Date of Onset: 12/07/14 Other Pertinent Information: 64 y/o male admitted on 4/26 with septic shock and acute respiratory failure due to cholecystitis and an abscess associated with a recently dislodged gallbladder drain.  Pt intubated 4/26-12/06/14.  Swallow evaluation ordered. Type of Study: Bedside swallow evaluation Diet Prior to this Study: Thin liquids (clears) Temperature Spikes Noted: No Respiratory Status: Supplemental O2 delivered via (comment) History of Recent Intubation: Yes Length of Intubations (days): 9 days Date extubated: 12/06/14 Behavior/Cognition: Alert;Cooperative;Pleasant mood Oral Cavity - Dentition: Other (Comment) (pt with partials) Self-Feeding Abilities: Able to feed self Patient Positioning: Upright in bed Baseline Vocal Quality: Low vocal intensity;Hoarse Volitional Cough: Strong Volitional Swallow: Able to elicit    Oral/Motor/Sensory Function Overall Oral Motor/Sensory Function: Appears within functional limits for tasks assessed   Ice Chips Ice chips: Impaired Presentation: Spoon Oral Phase Impairments: Reduced lingual movement/coordination;Impaired anterior to posterior transit Oral Phase Functional Implications: Prolonged oral transit Pharyngeal Phase Impairments: Suspected delayed Swallow   Thin Liquid Thin Liquid: Impaired Presentation: Spoon;Straw Oral Phase Impairments: Reduced lingual movement/coordination;Impaired anterior  to posterior transit Oral Phase Functional Implications: Prolonged oral  transit Pharyngeal  Phase Impairments: Suspected delayed Swallow;Cough - Delayed    Nectar Thick Nectar Thick Liquid: Impaired Presentation: Spoon;Straw Oral Phase Impairments: Reduced lingual movement/coordination;Impaired anterior to posterior transit Oral phase functional implications: Prolonged oral transit   Honey Thick Honey Thick Liquid: Not tested   Puree Puree: Within functional limits Presentation: Self Fed;Spoon   Solid   GO    Solid: Impaired Presentation: Self Fed Oral Phase Impairments: Reduced lingual movement/coordination;Impaired anterior to posterior transit Oral Phase Functional Implications: Other (comment) (prolonged oral transiting) Pharyngeal Phase Impairments: Suspected delayed [name], [place] [facility] SLP [phone]

## 2014-12-07 NOTE — Evaluation (Deleted)
SLP Cancellation Note  Patient Details Name: Allen Green MRN: [id number] DOB: Jun 01, 1951   Cancelled treatment:       Reason Eval/Treat Not Completed:  (pt undergoing dialysis at that time, will reattempt at later time, note pt on clear liquids)   Macario Golds 12/07/2014, 2:30 PM  Luanna Salk, [place] [facility] SLP [phone]

## 2014-12-08 ENCOUNTER — Other Ambulatory Visit: Payer: Self-pay | Admitting: Emergency Medicine

## 2014-12-08 DIAGNOSIS — N179 Acute kidney failure, unspecified: Secondary | ICD-10-CM | POA: Diagnosis present

## 2014-12-08 LAB — GLUCOSE, CAPILLARY
GLUCOSE-CAPILLARY: 90 mg/dL (ref 70–99)
Glucose-Capillary: 79 mg/dL (ref 70–99)
Glucose-Capillary: 93 mg/dL (ref 70–99)
Glucose-Capillary: 86 mg/dL (ref 70–99)
Glucose-Capillary: 79 mg/dL (ref 70–99)

## 2014-12-08 LAB — CBC
HEMATOCRIT: 29.8 % — AB (ref 39.0–52.0)
Hemoglobin: 9.7 g/dL — ABNORMAL LOW (ref 13.0–17.0)
MCH: 29.8 pg (ref 26.0–34.0)
MCHC: 32.6 g/dL (ref 30.0–36.0)
MCV: 91.7 fL (ref 78.0–100.0)
Platelets: 260 10*3/uL (ref 150–400)
RBC: 3.25 MIL/uL — ABNORMAL LOW (ref 4.22–5.81)
RDW: 15.6 % — AB (ref 11.5–15.5)
WBC: 22.3 10*3/uL — ABNORMAL HIGH (ref 4.0–10.5)

## 2014-12-08 LAB — RENAL FUNCTION PANEL
Albumin: 2.1 g/dL — ABNORMAL LOW (ref 3.5–5.0)
Anion gap: 5 (ref 5–15)
BUN: 90 mg/dL — AB (ref 6–20)
CHLORIDE: 105 mmol/L (ref 101–111)
CO2: 24 mmol/L (ref 22–32)
CREATININE: 4.55 mg/dL — AB (ref 0.61–1.24)
Calcium: 7.9 mg/dL — ABNORMAL LOW (ref 8.9–10.3)
GFR calc Af Amer: 14 mL/min — ABNORMAL LOW (ref >60)
GFR calc non Af Amer: 13 mL/min — ABNORMAL LOW (ref >60)
Glucose, Bld: 106 mg/dL — ABNORMAL HIGH (ref 70–99)
PHOSPHORUS: 5.9 mg/dL — AB (ref 2.5–4.6)
POTASSIUM: 4.1 mmol/L (ref 3.5–5.1)
SODIUM: 134 mmol/L — AB (ref 135–145)

## 2014-12-08 LAB — CLOSTRIDIUM DIFFICILE BY PCR: Toxigenic C. Difficile by PCR: NEGATIVE

## 2014-12-08 MED ORDER — ARFORMOTEROL TARTRATE 15 MCG/2ML IN NEBU
15.0000 ug | INHALATION_SOLUTION | Freq: Two times a day (BID) | RESPIRATORY_TRACT | Status: DC
  Administered 2014-12-08 – 2014-12-18 (×17): 15 ug via RESPIRATORY_TRACT
  Filled 2014-12-08 (×24): qty 2

## 2014-12-08 MED ORDER — FAMOTIDINE 20 MG PO TABS
20.0000 mg | ORAL_TABLET | Freq: Every day | ORAL | Status: DC
  Administered 2014-12-08 – 2014-12-18 (×10): 20 mg via ORAL
  Filled 2014-12-08 (×11): qty 1

## 2014-12-08 MED ORDER — FUROSEMIDE 10 MG/ML IJ SOLN
120.0000 mg | Freq: Three times a day (TID) | INTRAMUSCULAR | Status: DC
  Administered 2014-12-08 – 2014-12-15 (×14): 120 mg via INTRAVENOUS
  Filled 2014-12-08 (×26): qty 12

## 2014-12-08 MED ORDER — SODIUM CHLORIDE 0.9 % IV SOLN
500.0000 mg | Freq: Two times a day (BID) | INTRAVENOUS | Status: DC
  Administered 2014-12-08 – 2014-12-10 (×4): 500 mg via INTRAVENOUS
  Filled 2014-12-08 (×7): qty 0.5

## 2014-12-08 MED ORDER — AMIODARONE HCL 200 MG PO TABS
200.0000 mg | ORAL_TABLET | Freq: Every day | ORAL | Status: DC
  Administered 2014-12-08 – 2014-12-10 (×3): 200 mg via ORAL
  Filled 2014-12-08 (×3): qty 1

## 2014-12-08 MED ORDER — BUDESONIDE 0.5 MG/2ML IN SUSP
0.5000 mg | Freq: Two times a day (BID) | RESPIRATORY_TRACT | Status: DC
  Administered 2014-12-08 – 2014-12-19 (×17): 0.5 mg via RESPIRATORY_TRACT
  Filled 2014-12-08 (×24): qty 2

## 2014-12-08 MED ORDER — TRAZODONE HCL 50 MG PO TABS
50.0000 mg | ORAL_TABLET | Freq: Every evening | ORAL | Status: DC | PRN
  Administered 2014-12-10 (×2): 50 mg via ORAL
  Filled 2014-12-08 (×4): qty 1

## 2014-12-08 NOTE — Progress Notes (Signed)
ANTIBIOTIC CONSULT NOTE - FOLLOW UP  Pharmacy Consult for Meropenem Indication: Intra-abdominal infection, Gram negative bacteremia  No Known Allergies  Patient Measurements: Height: 5\' 11"  (180.3 cm) Weight: 255 lb 11.7 oz (116 kg) IBW/kg (Calculated) : 75.3  Vital Signs: Temp: 97.9 F (36.6 C) (05/07 0300) Temp Source: Oral (05/07 0300) BP: 111/49 mmHg (05/07 0700) Pulse Rate: 55 (05/07 0700) Intake/Output from previous day: 05/06 0701 - 05/07 0700 In: 1315.8 [P.O.:355; I.V.:760.8; IV Piggyback:200] Out: 826.6 [Urine:37] Intake/Output from this shift: Total I/O In: 86.7 [P.O.:60; I.V.:26.7] Out: -   Labs:  Recent Labs  12/05/14 1600 12/06/14 0536 12/06/14 1615 12/07/14 0520 12/08/14 0530 12/08/14 0840  WBC  --  19.5*  --  20.4* 22.3*  --   HGB  --  9.2*  --  9.3* 9.7*  --   PLT  --  203  --  237 260  --   CREATININE 2.72*  --  2.77*  --   --  4.55*   Estimated Creatinine Clearance: 21.5 mL/min (by C-G formula based on Cr of 4.55). No results for input(s): VANCOTROUGH, VANCOPEAK, VANCORANDOM, GENTTROUGH, GENTPEAK, GENTRANDOM, TOBRATROUGH, TOBRAPEAK, TOBRARND, AMIKACINPEAK, AMIKACINTROU, AMIKACIN in the last 72 hours.   Microbiology: Recent Results (from the past 720 hour(s))  Blood culture (routine x 2)     Status: None   Collection Time: 11/27/14  1:45 AM  Result Value Ref Range Status   Specimen Description BLOOD L FA  Final   Special Requests BOTTLES DRAWN AEROBIC AND ANAEROBIC 5CC  Final   Culture   Final    BACTEROIDES THETAIOTAOMICRON Note: BETA LACTAMASE POSITIVE Note: Gram Stain Report Called to,Read Back By and Verified With: ALISON RN ON ICU AT 1651 [id number] BY [facility] Performed at [facility]    Report Status 11/30/2014 FINAL  Final  Blood culture (routine x 2)     Status: None   Collection Time: 11/27/14  1:50 AM  Result Value Ref Range Status   Specimen Description BLOOD LHAND  Final   Special Requests BOTTLES DRAWN AEROBIC AND  ANAEROBIC 5CC  Final   Culture   Final    BACTEROIDES THETAIOTAOMICRON Note: BETA LACTAMASE POSITIVE Note: Gram Stain Report Called to,Read Back By and Verified With: Charmayne Sheer @ [id number] ON [id number] BY [facility] Performed at [facility]    Report Status 11/30/2014 FINAL  Final  MRSA PCR Screening     Status: None   Collection Time: 11/27/14  9:25 AM  Result Value Ref Range Status   MRSA by PCR NEGATIVE NEGATIVE Final    Comment:        The GeneXpert MRSA Assay (FDA approved for NASAL specimens only), is one component of a comprehensive MRSA colonization surveillance program. It is not intended to diagnose MRSA infection nor to guide or monitor treatment for MRSA infections.   Culture, respiratory (NON-Expectorated)     Status: None   Collection Time: 11/27/14 10:56 AM  Result Value Ref Range Status   Specimen Description TRACHEAL ASPIRATE  Final   Special Requests Normal  Final   Gram Stain   Final    FEW WBC PRESENT,BOTH PMN AND MONONUCLEAR RARE SQUAMOUS EPITHELIAL CELLS PRESENT RARE GRAM POSITIVE COCCI IN PAIRS Performed at [facility]    Culture   Final    Non-Pathogenic Oropharyngeal-type Flora Isolated. Performed at [facility]    Report Status 11/29/2014 FINAL  Final  Body fluid culture     Status: None   Collection Time:  11/27/14  5:06 PM  Result Value Ref Range Status   Specimen Description GALL BLADDER  Final   Special Requests NONE  Final   Gram Stain   Final    RARE WBC PRESENT,BOTH PMN AND MONONUCLEAR FEW GRAM NEGATIVE RODS RARE GRAM POSITIVE COCCI IN PAIRS Gram Stain Report Called to,Read Back By and Verified With: Gram Stain Report Called to,Read Back By and Verified With: Charmayne Sheer RN 11/28/14 8:35AM BY [place] Performed at [facility]    Culture   Final    MULTIPLE ORGANISMS PRESENT, NONE PREDOMINANT Note: NO STAPHYLOCOCCUS AUREUS ISOLATED NO GROUP A STREP (S.PYOGENES) ISOLATED Performed at [facility]    Report Status 11/30/2014 FINAL  Final  Clostridium Difficile by PCR     Status: None   Collection Time: 11/27/14  8:54 PM  Result Value Ref Range Status   C difficile by pcr NEGATIVE NEGATIVE Final  Culture, respiratory (NON-Expectorated)     Status: None   Collection Time: 12/04/14  9:27 AM  Result Value Ref Range Status   Specimen Description TRACHEAL ASPIRATE  Final   Special Requests NONE  Final   Gram Stain   Final    ABUNDANT WBC PRESENT, PREDOMINANTLY PMN NO SQUAMOUS EPITHELIAL CELLS SEEN NO ORGANISMS SEEN RARE YEAST Performed at [facility]    Culture   Final    FEW CANDIDA ALBICANS Performed at [facility]    Report Status 12/06/2014 FINAL  Final  Clostridium Difficile by PCR     Status: None   Collection Time: 12/08/14  5:15 AM  Result Value Ref Range Status   C difficile by pcr NEGATIVE NEGATIVE Final    Anti-infectives    Start     Dose/Rate Route Frequency Ordered Stop   11/30/14 1200  meropenem (MERREM) 1 g in sodium chloride 0.9 % 100 mL IVPB     1 g 200 mL/hr over 30 Minutes Intravenous Every 12 hours 11/30/14 1124     11/30/14 0930  vancomycin (VANCOCIN) 1,250 mg in sodium chloride 0.9 % 250 mL IVPB     1,250 mg 166.7 mL/hr over 90 Minutes Intravenous  Once 11/30/14 0829 11/30/14 1100   11/28/14 2000  piperacillin-tazobactam (ZOSYN) IVPB 2.25 g  Status:  Discontinued     2.25 g 100 mL/hr over 30 Minutes Intravenous Every 8 hours 11/28/14 1357 11/28/14 1853   11/28/14 2000  piperacillin-tazobactam (ZOSYN) IVPB 3.375 g  Status:  Discontinued     3.375 g 100 mL/hr over 30 Minutes Intravenous Every 6 hours 11/28/14 1854 11/30/14 1101   11/28/14 1930  ciprofloxacin (CIPRO) IVPB 400 mg  Status:  Discontinued     400 mg 200 mL/hr over 60 Minutes Intravenous Every 12 hours 11/28/14 1851 12/02/14 1543   11/27/14 1700  vancomycin (VANCOCIN) IVPB 750 mg/150 ml premix  Status:  Discontinued     750 mg 150 mL/hr over 60 Minutes  Intravenous Every 12 hours 11/27/14 0640 11/28/14 1141   11/27/14 1000  piperacillin-tazobactam (ZOSYN) IVPB 3.375 g  Status:  Discontinued     3.375 g 12.5 mL/hr over 240 Minutes Intravenous Every 8 hours 11/27/14 0435 11/28/14 1357   11/27/14 0130  vancomycin (VANCOCIN) 2,000 mg in sodium chloride 0.9 % 500 mL IVPB     2,000 mg 250 mL/hr over 120 Minutes Intravenous  Once 11/27/14 0120 11/27/14 0643   11/27/14 0130  piperacillin-tazobactam (ZOSYN) IVPB 3.375 g     3.375 g 100 mL/hr over 30 Minutes  Intravenous  Once 11/27/14 0120 11/27/14 0227      Assessment: 64 y/o male admitted on 4/26 with septic shock and acute respiratory failure due to cholecystitis and an abscess associated with a recently dislodged gallbladder drain.  Patient was placed on CRRT 4/27 and Pharmacy has been consulted to manage antibiotic dosing since then.  Today, 12/08/2014:  Remains afebrile.  WBCs remain elevated(steroid).  PCT wnl now.  CRRT stopped 5/6. SCr has nearly doubled to 4.55, CrCl 22CG, 17N.   4/26 >> Vanc >> 4/30 4/26 >> Zosyn >> 4/29  4/27 >> Cipro>> 5/1 4/29 >> Meropenem >>   4/26 blood x2: Bacteroides thetaiotaomicron BL+ 4/26 trach asp: normal flora-final 4/26 gall bladder fluid: polymicrobial-final 4/26 C.diff PCR: neg 4/26 MRSA PCR: neg 5/3 trach asp: few Candida albicans-final  Goal of Therapy:  Appropriate antibiotic dosing for renal function; eradication of infection  Plan:  Decrease meropenem from 1g q12h to 500mg  q12h. Follow up renal fxn, culture results, clinical course. Planning to stop on 5/9 per CCM notes.  Romeo Rabon, PharmD, pager [phone]. 12/08/2014,10:02 AM.

## 2014-12-08 NOTE — Progress Notes (Signed)
PULMONARY / CRITICAL CARE MEDICINE   Name: Allen Green MRN: [id number] DOB: 1951-01-20    ADMISSION DATE:  11/04/2014 CONSULTATION DATE: 4/26  REFERRING MD :  [facility]  CHIEF COMPLAINT:  SOB  INITIAL PRESENTATION: 64 y/o male admitted on 4/26 with septic shock and acute respiratory failure due to cholecystitis and an abscess associated with a recently dislodged gallbladder drain. Course complicated by bibasal HCAP with severe hypoxemia and severe autoPEEP  STUDIES:  4/26 CT Abdomen> acute cholecystitis and gallbladder tract abscess 4/26 ECHO> poor study, can't comment on LV or RV size/function 4/29 CT abdomen >> Mildly prominent central small bowel loops in the lower abdomen and upper pelvis , decompressed gallbladder. 5/1 ven duplex neg bLE  SIGNIFICANT EVENTS: 4/26 intubated, Gallbladder drain placed by IR 4/29 multiple amio bolus for RVR 4/30 worsening hypoxia 5/3 improving pressor needs 5/4 SBTs 5/5 extubated 5/6 off CVVHD  SUBJECTIVE:   Did not sleep Remains extubated Ate yesterday Loose stools Off CVVHD   VITAL SIGNS: Temp:  [96.3 F (35.7 C)-98.3 F (36.8 C)] 97.9 F (36.6 C) (05/07 0300) Pulse Rate:  [49-105] 55 (05/07 0700) Resp:  [14-30] 18 (05/07 0700) BP: (71-149)/(36-68) 111/49 mmHg (05/07 0700) SpO2:  [92 %-98 %] 93 % (05/07 0700) HEMODYNAMICS:   VENTILATOR SETTINGS:   INTAKE / OUTPUT:  Intake/Output Summary (Last 24 hours) at 12/08/14 0849 Last data filed at 12/08/14 0600  Gross per 24 hour  Intake 1252.4 ml  Output  637.6 ml  Net  614.8 ml    PHYSICAL EXAMINATION: General:  Comfortable in bed HENT :NCAT OP clear, CVL and HD cath in place Eyes: sclera anicteric PULM: few crackles in bases, normal effort CV: Regular rate, distant heart sounds GI: BS+, soft, nontender MSK: normal bulk and tone NEuro: A&Ox4, maew  LABS:  CBC  Recent Labs Lab 12/06/14 0536 12/07/14 0520 12/08/14 0530  WBC 19.5* 20.4* 22.3*  HGB 9.2* 9.3* 9.7*  HCT  28.4* 27.3* 29.8*  PLT 203 237 260   Coag's  Recent Labs Lab 12/07/14 0755 12/07/14 1155 12/07/14 2100  APTT 84* 63* 33   BMET  Recent Labs Lab 12/04/14 1556 12/05/14 1600 12/06/14 1615  NA 135 136 135  K 4.2 3.7 4.2  CL 91* 93* 101  CO2 32 33* 26  BUN 31* 49* 54*  CREATININE 2.85* 2.72* 2.77*  GLUCOSE 167* 152* 137*   Electrolytes  Recent Labs Lab 12/04/14 1556 12/05/14 0406 12/05/14 1600 12/06/14 0536 12/06/14 1615 12/07/14 0520  CALCIUM 8.3*  --  8.1*  --  7.9*  --   MG  --  2.4  --  2.7*  --  2.8*  PHOS 4.1  --  3.7  --  4.4  --    Sepsis Markers  Recent Labs Lab 12/05/14 0406 12/06/14 0536 12/07/14 0520  PROCALCITON 2.25 1.29 0.69   ABG  Recent Labs Lab 12/03/14 0522 12/04/14 0530 12/06/14 0930  PHART 7.375 7.382 7.392  PCO2ART 58.6* 49.5* 45.7*  PO2ART 89.2 136* 76.1*   Liver Enzymes  Recent Labs Lab 12/04/14 1556 12/05/14 1600 12/06/14 1615  ALBUMIN 2.2* 2.1* 2.1*   Cardiac Enzymes No results for input(s): TROPONINI, PROBNP in the last 168 hours. Glucose  Recent Labs Lab 12/07/14 1233 12/07/14 1706 12/07/14 2020 12/07/14 2341 12/08/14 0401 12/08/14 0735  GLUCAP 109* 118* 86 87 79 90    Imaging CXR 5/5 bibasal pna/ effusions    ASSESSMENT / PLAN:  PULMONARY OETT 4/26(PCCM)>>5/5 A: Acute respiratory failure with  hypercapnea> extubated 5/7 Severe COPD, but did OK with mechanical ventilation so elective ex-lap not unreasonable Tobacco abuse BLL HCAP P:   BD's OOB Start pulmicort/brovana for COPD Continue duoneb  CARDIOVASCULAR CVL 4/26 L IJ CVL>> R IJ HD cath 4/28 >>  A:  Septic shock > resolved AF-RVR > resolved P:  Pressors off Change amiodarone to po   RENAL Lab Results  Component Value Date   CREATININE 2.77* 12/06/2014   CREATININE 2.72* 12/05/2014   CREATININE 2.85* 12/04/2014    Recent Labs Lab 12/04/14 1556 12/05/14 1600 12/06/14 1615  K 4.2 3.7 4.2      A: Oliguric AKI  -CVVH 4/27 > 5/6 P:   Monitor BMET and UOP Renal following OK from our standpoint to transfer to [facility], will discuss with renal  GASTROINTESTINAL A:   Acute Cholecystis > with peri-gallbladder abscess, s/p drain 4/26 per IR Recent IR drain 4/3->4/12  Ileus S/p reglan  Diarrhea> c.diff neg x2 P:   Monitor drain output Continue diet as ordered Change ppi to h2 blocker oral  HEMATOLOGIC A:   Chronic polycythemia - now anemic P:  Follow H/H Monitor for bleeding  INFECTIOUS A:   Septic shock> worse 4/29 likely all related to draining abscess on 4/26 P:   BCx2  4/26>>bacteroides Sputum 4/26>>nl flora Wound culture 4/26 > bacteroides 4/26 c dif>>neg 5/3 resp cx >> candida Abx:  4/26 vanc>> 4/30 4/26 zoysn>>4/29 4/27 cipro>> 5/1 4/29 meropenem >> Pct decreasing - reassuring, plan dc meropenem 5/9  ENDOCRINE CBG (last 3)   Recent Labs  12/07/14 2341 12/08/14 0401 12/08/14 0735  GLUCAP 87 79 90    A:   No acute issue P:   SSI  NEUROLOGIC A:  Insomnia Severe agitation/ asynchrony > resolved  P:   fent prn  Add trazodone  PT consult   FAMILY  - Updates: updated 5/6 and 5/7  - Inter-disciplinary family meet or Palliative Care meeting due by: NA  Roselie Awkward, MD [facility] Pager: [phone] Cell: [phone] If no response, call [phone]    12/08/2014, 8:49 AM

## 2014-12-08 NOTE — Progress Notes (Signed)
  [facility] Progress Note   Subjective: doing well, no SOB, cough improving  Filed Vitals:   12/08/14 1300 12/08/14 1400 12/08/14 1449 12/08/14 1500  BP: 141/51 120/36    Pulse: 55 55  55  Temp:      TempSrc:      Resp: 19 24  19   Height:      Weight:      SpO2: 95% 99% 95% 96%   Exam: Extubated, alert No jvd Chest exp wheezing RRR no rub/ gallop Abd obese, nondistended Mild-moderate diffuse edema Neuro follows commands  UNa 51, UCr 136 UA  gran casts, amber, 100 prot, 1.022, 11-20 wbc, no rbc CXR no chg bibasilar airspace dz        Assessment: 1. AKI / ATN - from sepsis/ contrast, oliguric.  2. Bacteroides sepsis/ gallbladder infection, s/p chole tube - off pressors, on meropenem 3. Volume excess - moderate, IS edema on CXR remains 4. Shock - resolved  Plan -  Start high dose IV lasix. Watch renal function. If he needs further dialysis , will need to move to [facility] and do intermittent HD.     Kelly Splinter MD  pager [phone]    cell [phone]  12/08/2014, 6:08 PM     Recent Labs Lab 12/05/14 1600 12/06/14 1615 12/08/14 0840  NA 136 135 134*  K 3.7 4.2 4.1  CL 93* 101 105  CO2 33* 26 24  GLUCOSE 152* 137* 106*  BUN 49* 54* 90*  CREATININE 2.72* 2.77* 4.55*  CALCIUM 8.1* 7.9* 7.9*  PHOS 3.7 4.4 5.9*    Recent Labs Lab 12/05/14 1600 12/06/14 1615 12/08/14 0840  ALBUMIN 2.1* 2.1* 2.1*    Recent Labs Lab 12/06/14 0536 12/07/14 0520 12/08/14 0530  WBC 19.5* 20.4* 22.3*  HGB 9.2* 9.3* 9.7*  HCT 28.4* 27.3* 29.8*  MCV 90.7 89.8 91.7  PLT 203 237 260   . amiodarone  200 mg Oral Daily  . antiseptic oral rinse  7 mL Mouth Rinse BID  . arformoterol  15 mcg Nebulization BID  . budesonide (PULMICORT) nebulizer solution  0.5 mg Nebulization BID  . famotidine  20 mg Oral Daily  . heparin subcutaneous  5,000 Units Subcutaneous 3 times per day  . hydrocortisone sod succinate (SOLU-CORTEF) inj  50 mg Intravenous Q12H  .  ipratropium-albuterol  3 mL Nebulization Q6H  . meropenem (MERREM) IV  500 mg Intravenous Q12H  . sodium chloride  3 mL Intravenous Q12H   . sodium chloride 10 mL/hr at 12/08/14 0800   acetaminophen **OR** acetaminophen, albumin human, docusate, fentaNYL (SUBLIMAZE) injection, ondansetron **OR** ondansetron (ZOFRAN) IV, RESOURCE THICKENUP CLEAR, traZODone

## 2014-12-08 NOTE — Progress Notes (Signed)
Speech Language Pathology Treatment: Dysphagia  Patient Details Name: Allen Green MRN: [id number] DOB: [birth year] Today's Date: 12/08/2014 Time: 5366-4403 SLP Time Calculation (min) (ACUTE ONLY): 10 min  Assessment / Plan / Recommendation Clinical Impression  Pt's vocal strength has returned to near baseline level per pt and family.  Pt reports good tolerance of diet.  Observed pt being given tea via toothette by family (from outside the room) with [facility] lowered. Swallow followed by immediate cough.  Raised HOB and advised pt/family to assure pt sitting fully upright for all intake.   Cough and voice are strong and SLP suspected pharyngeal/laryngeal edema if present from intubation has resolved.   Sitting upright with consumption of water via straw, pt with good tolerance and exhalation after swallow.    Recommend advance diet to regular/thin with ongoing aspiration and reflux precautions due to pt's COPD and premorbid reflux.    Please note, pt reports eating at home laying down on his sofa - Advised strongly against this using teach back for clinical reasoning.   SLP to sign off, please reorder if indicated.    HPI Other Pertinent Information: 64 y/o male admitted on 4/26 with septic shock and acute respiratory failure due to cholecystitis and an abscess associated with a recently dislodged gallbladder drain.  Pt intubated 4/26-12/06/14.  Swallow evaluation ordered.   Pertinent Vitals Pain Assessment: No/denies pain  SLP Plan  All goals met    Recommendations Diet recommendations: Regular;Thin liquid Liquids provided via: Cup;Straw Medication Administration: Whole meds with liquid Supervision: Patient able to self feed Compensations:  (rest if short of breath) Postural Changes and/or Swallow Maneuvers: Seated upright 90 degrees;Upright 30-60 min after meal              Oral Care Recommendations: Oral care BID Follow up Recommendations: None Plan: All goals met    [place], [place] [facility] [place]

## 2014-12-09 LAB — BASIC METABOLIC PANEL
ANION GAP: 13 (ref 5–15)
BUN: 116 mg/dL — ABNORMAL HIGH (ref 6–20)
CHLORIDE: 103 mmol/L (ref 101–111)
CO2: 22 mmol/L (ref 22–32)
Calcium: 8.2 mg/dL — ABNORMAL LOW (ref 8.9–10.3)
Creatinine, Ser: 6.6 mg/dL — ABNORMAL HIGH (ref 0.61–1.24)
GFR calc Af Amer: 9 mL/min — ABNORMAL LOW (ref >60)
GFR calc non Af Amer: 8 mL/min — ABNORMAL LOW (ref >60)
Glucose, Bld: 89 mg/dL (ref 70–99)
Potassium: 4.4 mmol/L (ref 3.5–5.1)
Sodium: 138 mmol/L (ref 135–145)

## 2014-12-09 LAB — CBC WITH DIFFERENTIAL/PLATELET
BASOS PCT: 0 % (ref 0–1)
Basophils Absolute: 0 10*3/uL (ref 0.0–0.1)
EOS ABS: 0.2 10*3/uL (ref 0.0–0.7)
EOS PCT: 1 % (ref 0–5)
HEMATOCRIT: 26.4 % — AB (ref 39.0–52.0)
HEMOGLOBIN: 8.8 g/dL — AB (ref 13.0–17.0)
LYMPHS PCT: 9 % — AB (ref 12–46)
Lymphs Abs: 1.6 10*3/uL (ref 0.7–4.0)
MCH: 30.1 pg (ref 26.0–34.0)
MCHC: 33.3 g/dL (ref 30.0–36.0)
MCV: 90.4 fL (ref 78.0–100.0)
Monocytes Absolute: 1.4 10*3/uL — ABNORMAL HIGH (ref 0.1–1.0)
Monocytes Relative: 8 % (ref 3–12)
NEUTROS ABS: 14.8 10*3/uL — AB (ref 1.7–7.7)
Neutrophils Relative %: 82 % — ABNORMAL HIGH (ref 43–77)
Platelets: 341 10*3/uL (ref 150–400)
RBC: 2.92 MIL/uL — ABNORMAL LOW (ref 4.22–5.81)
RDW: 15.7 % — ABNORMAL HIGH (ref 11.5–15.5)
WBC: 18 10*3/uL — ABNORMAL HIGH (ref 4.0–10.5)

## 2014-12-09 LAB — GLUCOSE, CAPILLARY
GLUCOSE-CAPILLARY: 82 mg/dL (ref 70–99)
GLUCOSE-CAPILLARY: 84 mg/dL (ref 70–99)
GLUCOSE-CAPILLARY: 96 mg/dL (ref 70–99)
Glucose-Capillary: 82 mg/dL (ref 70–99)
Glucose-Capillary: 93 mg/dL (ref 70–99)
Glucose-Capillary: 109 mg/dL — ABNORMAL HIGH (ref 70–99)

## 2014-12-09 LAB — RENAL FUNCTION PANEL
ALBUMIN: 1.9 g/dL — AB (ref 3.5–5.0)
ANION GAP: 15 (ref 5–15)
BUN: 116 mg/dL — AB (ref 6–20)
CALCIUM: 8.2 mg/dL — AB (ref 8.9–10.3)
CO2: 21 mmol/L — ABNORMAL LOW (ref 22–32)
CREATININE: 6.39 mg/dL — AB (ref 0.61–1.24)
Chloride: 102 mmol/L (ref 101–111)
GFR calc Af Amer: 10 mL/min — ABNORMAL LOW (ref >60)
GFR calc non Af Amer: 8 mL/min — ABNORMAL LOW (ref >60)
GLUCOSE: 89 mg/dL (ref 70–99)
Phosphorus: 7.7 mg/dL — ABNORMAL HIGH (ref 2.5–4.6)
Potassium: 4.5 mmol/L (ref 3.5–5.1)
Sodium: 138 mmol/L (ref 135–145)

## 2014-12-09 MED ORDER — NEPRO/CARBSTEADY PO LIQD: 237.0000 mL | ORAL | Status: DC | PRN

## 2014-12-09 MED ORDER — HEPARIN SODIUM (PORCINE) 1000 UNIT/ML DIALYSIS
4000.0000 [IU] | INTRAMUSCULAR | Status: DC | PRN
  Administered 2014-12-09: 4000 [IU] via INTRAVENOUS_CENTRAL
  Filled 2014-12-09: qty 4

## 2014-12-09 MED ORDER — HEPARIN SODIUM (PORCINE) 1000 UNIT/ML DIALYSIS: 4000.0000 [IU] | INTRAMUSCULAR | Status: DC | PRN

## 2014-12-09 MED ORDER — HEPARIN SODIUM (PORCINE) 1000 UNIT/ML DIALYSIS: 1000.0000 [IU] | INTRAMUSCULAR | Status: DC | PRN

## 2014-12-09 MED ORDER — SODIUM CHLORIDE 0.9 % IJ SOLN: 10.0000 mL | INTRAMUSCULAR | Status: DC | PRN

## 2014-12-09 MED ORDER — LIDOCAINE HCL (PF) 1 % IJ SOLN: 5.0000 mL | INTRAMUSCULAR | Status: DC | PRN

## 2014-12-09 MED ORDER — PENTAFLUOROPROP-TETRAFLUOROETH EX AERO: 1.0000 "application " | INHALATION_SPRAY | CUTANEOUS | Status: DC | PRN

## 2014-12-09 MED ORDER — SODIUM CHLORIDE 0.9 % IV SOLN: 100.0000 mL | INTRAVENOUS | Status: DC | PRN

## 2014-12-09 MED ORDER — NEPRO/CARBSTEADY PO LIQD
237.0000 mL | ORAL | Status: DC | PRN
  Filled 2014-12-09: qty 237

## 2014-12-09 MED ORDER — LIDOCAINE-PRILOCAINE 2.5-2.5 % EX CREA: 1.0000 "application " | TOPICAL_CREAM | CUTANEOUS | Status: DC | PRN

## 2014-12-09 MED ORDER — SODIUM CHLORIDE 0.9 % IJ SOLN
10.0000 mL | Freq: Two times a day (BID) | INTRAMUSCULAR | Status: DC
  Administered 2014-12-09 – 2014-12-11 (×4): 10 mL

## 2014-12-09 MED ORDER — LIDOCAINE-PRILOCAINE 2.5-2.5 % EX CREA
1.0000 "application " | TOPICAL_CREAM | CUTANEOUS | Status: DC | PRN
  Filled 2014-12-09: qty 5

## 2014-12-09 MED ORDER — ALTEPLASE 2 MG IJ SOLR: 2.0000 mg | Freq: Once | INTRAMUSCULAR | Status: DC | PRN

## 2014-12-09 MED ORDER — ALTEPLASE 2 MG IJ SOLR
2.0000 mg | Freq: Once | INTRAMUSCULAR | Status: AC | PRN
Start: 2014-12-09 — End: 2014-12-10
  Administered 2014-12-10: 2.4 mg
  Filled 2014-12-09 (×3): qty 2

## 2014-12-09 MED ORDER — HEPARIN SODIUM (PORCINE) 1000 UNIT/ML DIALYSIS
1000.0000 [IU] | INTRAMUSCULAR | Status: DC | PRN
  Filled 2014-12-09: qty 1

## 2014-12-09 NOTE — Plan of Care (Signed)
Problem: Phase I Progression Outcomes Goal: Voiding-avoid urinary catheter unless indicated Outcome: Not Met (add Reason) Renal failure. Strict I&O     

## 2014-12-09 NOTE — Progress Notes (Signed)
[facility] Progress Note   Subjective: stable off the vent, B/Cr rising 116/6.0. 157 cc UOP yest which is actually an improvement  Filed Vitals:   12/09/14 0400 12/09/14 0500 12/09/14 0600 12/09/14 0717  BP: 104/45 126/41 105/24   Pulse: 56 55 55   Temp:      TempSrc:      Resp: 17 17 14    Height:      Weight:      SpO2: 96% 97% 96% 96%   Exam: Extubated, alert No jvd Chest exp wheezing RRR no rub/ gallop Abd obese, nondistended Mild-moderate diffuse edema Neuro follows commands  UNa 51, UCr 136 UA  gran casts, amber, 100 prot, 1.022, 11-20 wbc, no rbc CXR no chg bibasilar airspace dz  Summary: 65 yo w hx COPD on home O2, obesity admitted in April 2015 with acute cholecystitis, too high risk for surgery d/t pulm disease had perc drain placed. He improved and was dc'd.  The drain was removed in May 2015. He returned in June 2015 with RUQ pain/ N/V and distended GB by CT and another PCT was placed.  In Sept '15 the PCT fell out and was replaced by IR as outpatient.  The chole tube was then changed as part of routine in Oct 2015 and March 2016. On 11/03/14 pt was seen by IR for the tube falling out.  They noted that there wasn't evidence of recurrent cholecystitis, the wound was healed over and so there was not an indication for PCT replacement. On 4/26 patient was admitted w septic shock and gallbladder abcess. Pt was admitted to ICU. IR placed perc drain on 4/26.  Patient developed AKI/ ATN from shock/ contrast and was treated with CRRT from 4/27-5/6.   Assessment: 1. AKI / ATN - from sepsis/ contrast. S/P CRRT 4/27-5/6.  Made some urine 150 cc for the first time yesterday. Will need HD today d/t worsening azotemia. Plan tx to [facility], have d/w CCM.   2. Bacteroides sepsis/ gallbladder infection, s/p chole tube - off pressors, on meropenem 3. Volume excess - moderate, IS edema on CXR remains 4. Shock - resolved 5. Prognosis - overall very poor, pt w refractory GB  infection and not surgical candidate. Will ask palliative care to see. He is a very poor candidate for long-term dialysis and GOC should be addressed. Will d/w family as well.    Plan -  HD tonight, prob again tomorrow. May be able to do HD upstairs Monday.     Kelly Splinter MD  pager [phone]    cell [phone]  12/09/2014, 9:03 AM     Recent Labs Lab 12/06/14 1615 12/08/14 0840 12/09/14 0516 12/09/14 0517  NA 135 134* 138 138  K 4.2 4.1 4.5 4.4  CL 101 105 102 103  CO2 26 24 21* 22  GLUCOSE 137* 106* 89 89  BUN 54* 90* 116* 116*  CREATININE 2.77* 4.55* 6.39* 6.60*  CALCIUM 7.9* 7.9* 8.2* 8.2*  PHOS 4.4 5.9* 7.7*  --     Recent Labs Lab 12/06/14 1615 12/08/14 0840 12/09/14 0516  ALBUMIN 2.1* 2.1* 1.9*    Recent Labs Lab 12/07/14 0520 12/08/14 0530 12/09/14 0517  WBC 20.4* 22.3* 18.0*  NEUTROABS  --   --  14.8*  HGB 9.3* 9.7* 8.8*  HCT 27.3* 29.8* 26.4*  MCV 89.8 91.7 90.4  PLT 237 260 341   . amiodarone  200 mg Oral Daily  . antiseptic oral rinse  7 mL Mouth Rinse BID  .  arformoterol  15 mcg Nebulization BID  . budesonide (PULMICORT) nebulizer solution  0.5 mg Nebulization BID  . famotidine  20 mg Oral Daily  . furosemide  120 mg Intravenous 3 times per day  . heparin subcutaneous  5,000 Units Subcutaneous 3 times per day  . hydrocortisone sod succinate (SOLU-CORTEF) inj  50 mg Intravenous Q12H  . ipratropium-albuterol  3 mL Nebulization Q6H  . meropenem (MERREM) IV  500 mg Intravenous Q12H  . sodium chloride  3 mL Intravenous Q12H   . sodium chloride 10 mL/hr at 12/08/14 2000   acetaminophen **OR** acetaminophen, albumin human, docusate, fentaNYL (SUBLIMAZE) injection, ondansetron **OR** ondansetron (ZOFRAN) IV, RESOURCE THICKENUP CLEAR, traZODone

## 2014-12-09 NOTE — Progress Notes (Signed)
PULMONARY / CRITICAL CARE MEDICINE   Name: Allen Green MRN: [id number] DOB: 15-Sep-1950    ADMISSION DATE:  11/25/2014 CONSULTATION DATE: 4/26  REFERRING MD :  [facility]  CHIEF COMPLAINT:  SOB  INITIAL PRESENTATION: 64 y/o male admitted on 4/26 with septic shock and acute respiratory failure due to cholecystitis and an abscess associated with a recently dislodged gallbladder drain. Course complicated by bibasal HCAP with severe hypoxemia and severe autoPEEP  STUDIES:  4/26 CT Abdomen> acute cholecystitis and gallbladder tract abscess 4/26 ECHO> poor study, can't comment on LV or RV size/function 4/29 CT abdomen >> Mildly prominent central small bowel loops in the lower abdomen and upper pelvis , decompressed gallbladder. 5/1 ven duplex neg bLE  SIGNIFICANT EVENTS: 4/26 intubated, Gallbladder drain placed by IR 4/29 multiple amio bolus for RVR 4/30 worsening hypoxia 5/3 improving pressor needs 5/4 SBTs 5/5 extubated 5/6 off CVVHD 5/8 transfer to [facility] for intermittent HD  SUBJECTIVE:   Doing OK today, still quite weak   VITAL SIGNS: Temp:  [97.5 F (36.4 C)-99.4 F (37.4 C)] 99.4 F (37.4 C) (05/08 0004) Pulse Rate:  [39-72] 39 (05/08 1000) Resp:  [14-24] 16 (05/08 1000) BP: (104-148)/(24-59) 146/49 mmHg (05/08 1000) SpO2:  [83 %-100 %] 96 % (05/08 1000) HEMODYNAMICS:   VENTILATOR SETTINGS:   INTAKE / OUTPUT:  Intake/Output Summary (Last 24 hours) at 12/09/14 1215 Last data filed at 12/09/14 1009  Gross per 24 hour  Intake    584 ml  Output    157 ml  Net    427 ml    PHYSICAL EXAMINATION: General:  Comfortable in bed HENT :NCAT OP clear, CVL and HD cath in place Eyes: sclera anicteric PULM: few crackles in bases, normal effort CV: Regular rate, distant heart sounds GI: BS+, soft, nontender MSK: normal bulk and tone NEuro: A&Ox4, maew  LABS:  CBC  Recent Labs Lab 12/07/14 0520 12/08/14 0530 12/09/14 0517  WBC 20.4* 22.3* 18.0*  HGB 9.3* 9.7* 8.8*   HCT 27.3* 29.8* 26.4*  PLT 237 260 341   Coag's  Recent Labs Lab 12/07/14 0755 12/07/14 1155 12/07/14 2100  APTT 84* 63* 33   BMET  Recent Labs Lab 12/08/14 0840 12/09/14 0516 12/09/14 0517  NA 134* 138 138  K 4.1 4.5 4.4  CL 105 102 103  CO2 24 21* 22  BUN 90* 116* 116*  CREATININE 4.55* 6.39* 6.60*  GLUCOSE 106* 89 89   Electrolytes  Recent Labs Lab 12/05/14 0406  12/06/14 0536 12/06/14 1615 12/07/14 0520 12/08/14 0840 12/09/14 0516 12/09/14 0517  CALCIUM  --   < >  --  7.9*  --  7.9* 8.2* 8.2*  MG 2.4  --  2.7*  --  2.8*  --   --   --   PHOS  --   < >  --  4.4  --  5.9* 7.7*  --   < > = values in this interval not displayed. Sepsis Markers  Recent Labs Lab 12/05/14 0406 12/06/14 0536 12/07/14 0520  PROCALCITON 2.25 1.29 0.69   ABG  Recent Labs Lab 12/03/14 0522 12/04/14 0530 12/06/14 0930  PHART 7.375 7.382 7.392  PCO2ART 58.6* 49.5* 45.7*  PO2ART 89.2 136* 76.1*   Liver Enzymes  Recent Labs Lab 12/06/14 1615 12/08/14 0840 12/09/14 0516  ALBUMIN 2.1* 2.1* 1.9*   Cardiac Enzymes No results for input(s): TROPONINI, PROBNP in the last 168 hours. Glucose  Recent Labs Lab 12/08/14 0735 12/08/14 1200 12/08/14 1614 12/08/14 2012  12/09/14 0501 12/09/14 0822  GLUCAP 90 93 86 79 82 93    Imaging CXR 5/5 bibasal pna/ effusions    ASSESSMENT / PLAN:  PULMONARY OETT 4/26(PCCM)>>5/5 A: Acute respiratory failure with hypercapnea> extubated 5/7 Severe COPD, but did OK with mechanical ventilation so elective ex-lap not unreasonable Tobacco abuse BLL HCAP P:   BD's OOB Contniue pulmicort/brovana for COPD Continue duoneb  CARDIOVASCULAR CVL 4/26 L IJ CVL>> R IJ HD cath 4/28 >>  A:  Septic shock > resolved AF-RVR > resolved P:  Pressors off amiodarone po   RENAL Lab Results  Component Value Date   CREATININE 6.60* 12/09/2014   CREATININE 6.39* 12/09/2014   CREATININE 4.55* 12/08/2014    Recent Labs Lab  12/08/14 0840 12/09/14 0516 12/09/14 0517  K 4.1 4.5 4.4      A: Oliguric AKI -CVVH 4/27 > 5/6 P:   Monitor BMET and UOP Renal following Transfer to [facility] for intermittent HD  GASTROINTESTINAL A:   Acute Cholecystis > with peri-gallbladder abscess, s/p drain 4/26 per IR Recent IR drain 4/3->4/12  Ileus S/p reglan  Diarrhea> c.diff neg x2 P:   Monitor drain output Continue diet as ordered Continue h2 blocker oral for SUP  HEMATOLOGIC A:   Chronic polycythemia - now anemic P:  Follow H/H Monitor for bleeding  INFECTIOUS A:   Septic shock> worse 4/29 likely all related to draining abscess on 4/26 P:   BCx2  4/26>>bacteroides Sputum 4/26>>nl flora Wound culture 4/26 > bacteroides 4/26 c dif>>neg 5/3 resp cx >> candida Abx:  4/26 vanc>> 4/30 4/26 zoysn>>4/29 4/27 cipro>> 5/1 4/29 meropenem >>  Pct decreasing - reassuring, plan dc meropenem 5/9  ENDOCRINE CBG (last 3)   Recent Labs  12/08/14 2012 12/09/14 0501 12/09/14 0822  GLUCAP 79 82 93    A:   No acute issue P:   SSI  NEUROLOGIC A:  Insomnia Severe agitation/ asynchrony > resolved  P:   fent prn  Add trazodone  PT consult   FAMILY  - Updates: updated 5/8  Roselie Awkward, MD [facility] Pager: [phone] Cell: [phone] If no response, call [phone]    12/09/2014, 12:15 PM

## 2014-12-10 DIAGNOSIS — I4892 Unspecified atrial flutter: Secondary | ICD-10-CM

## 2014-12-10 LAB — RENAL FUNCTION PANEL
ANION GAP: 11 (ref 5–15)
Albumin: 1.8 g/dL — ABNORMAL LOW (ref 3.5–5.0)
BUN: 64 mg/dL — ABNORMAL HIGH (ref 6–20)
CHLORIDE: 102 mmol/L (ref 101–111)
CO2: 25 mmol/L (ref 22–32)
Calcium: 7.9 mg/dL — ABNORMAL LOW (ref 8.9–10.3)
Creatinine, Ser: 4.65 mg/dL — ABNORMAL HIGH (ref 0.61–1.24)
GFR calc non Af Amer: 12 mL/min — ABNORMAL LOW (ref >60)
GFR, EST AFRICAN AMERICAN: 14 mL/min — AB (ref >60)
Glucose, Bld: 78 mg/dL (ref 70–99)
Phosphorus: 6 mg/dL — ABNORMAL HIGH (ref 2.5–4.6)
Potassium: 3.8 mmol/L (ref 3.5–5.1)
Sodium: 138 mmol/L (ref 135–145)

## 2014-12-10 LAB — HEPATITIS B SURFACE ANTIGEN: HEP B S AG: NEGATIVE

## 2014-12-10 LAB — HEPATITIS B SURFACE ANTIBODY,QUALITATIVE: HEP B S AB: NEGATIVE

## 2014-12-10 LAB — HEPATITIS B CORE ANTIBODY, TOTAL: Hep B Core Total Ab: REACTIVE — AB

## 2014-12-10 MED ORDER — DARBEPOETIN ALFA 100 MCG/0.5ML IJ SOSY
100.0000 ug | PREFILLED_SYRINGE | INTRAMUSCULAR | Status: DC
  Administered 2014-12-10: 100 ug via SUBCUTANEOUS
  Filled 2014-12-10 (×2): qty 0.5

## 2014-12-10 MED ORDER — AMIODARONE HCL 200 MG PO TABS
400.0000 mg | ORAL_TABLET | Freq: Every day | ORAL | Status: DC
  Administered 2014-12-11 – 2014-12-16 (×6): 400 mg via ORAL
  Filled 2014-12-10 (×6): qty 2

## 2014-12-10 MED ORDER — AMIODARONE HCL 200 MG PO TABS
200.0000 mg | ORAL_TABLET | Freq: Once | ORAL | Status: AC
  Administered 2014-12-10: 200 mg via ORAL
  Filled 2014-12-10: qty 1

## 2014-12-10 MED ORDER — METOPROLOL TARTRATE 12.5 MG HALF TABLET
12.5000 mg | ORAL_TABLET | Freq: Two times a day (BID) | ORAL | Status: DC
  Administered 2014-12-10 – 2014-12-11 (×3): 12.5 mg via ORAL
  Filled 2014-12-10 (×4): qty 1

## 2014-12-10 NOTE — Progress Notes (Signed)
Rehab Admissions Coordinator Note:  Patient was screened by Retta Diones for appropriateness for an Inpatient Acute Rehab Consult.  At this time, we are recommending Inpatient Rehab consult.  Retta Diones 12/10/2014, 3:19 PM  I can be reached at [phone].

## 2014-12-10 NOTE — Progress Notes (Signed)
Transferred to 6E25 via bed. Portable monitor and oxygen on. Report given to Dola, Therapist, sports. Patient's upper and lower partial transferred with patient.

## 2014-12-10 NOTE — Evaluation (Signed)
Physical Therapy Evaluation Patient Details Name: Allen Green MRN: [id number] DOB: Oct 01, 1950 Today's Date: 12/10/2014   History of Present Illness  Pt is a 64 y/o male admitted on 4/26 with septic shock and ARF due to cholecystitis and an abscess associated with a recently diagnosed gallbladder drain. Course complicated by bibasal HCAP with severe hypoxemia. Pt intubated 4/26-5/5.  Clinical Impression  Pt admitted with above diagnosis. Pt currently with functional limitations due to the deficits listed below (see PT Problem List). At the time of PT eval pt was able to perform transfers with min assist +2 mainly for safety. Will need chair follow when progressing ambulation. Pt will benefit from skilled PT to increase their independence and safety with mobility to allow discharge to the venue listed below. It appears that PTA pt was relying on wife for most things, however anticipate he will progress well. Recommending CIR to maximize independence with mobility/ADL's and lessen burden of care on wife.      Follow Up Recommendations CIR;Supervision/Assistance - 24 hour    Equipment Recommendations  Rolling walker with 5" wheels    Recommendations for Other Services Rehab consult     Precautions / Restrictions Precautions Precautions: Fall Precaution Comments: Pt has a left "broken knee replacement". Original TKA was in 1995 and began giving him problems in 2005. Restrictions Weight Bearing Restrictions: No      Mobility  Bed Mobility Overal bed mobility: Needs Assistance Bed Mobility: Rolling;Sidelying to Sit Rolling: Mod assist Sidelying to sit: Mod assist;+2 for physical assistance       General bed mobility comments: Hand-over-hand assist to reach and grab bed rails. Assist to complete rolling and elevate trunk to full sitting position.   Transfers Overall transfer level: Needs assistance Equipment used: Rolling walker (2 wheeled) Transfers: Sit to/from [employer] Sit to Stand: Min assist;+2 safety/equipment Stand pivot transfers: Min assist;+2 physical assistance;+2 safety/equipment       General transfer comment: Assist for pt to power-up to full standing. Pt was initially able to stand ~2 minutes for peri-care and fatigued quickly, requiring a seated rest break. ~5 minute seated rest was required before pt was ready to attempt SPT to chair. Assist was required for walker placement and VC's for sequencing and technique for pt to take pivotal steps around to the recliner.   Ambulation/Gait             General Gait Details: Pt was unable to progress to gait training this session.   Stairs            Wheelchair Mobility    Modified Rankin (Stroke Patients Only)       Balance Overall balance assessment: Needs assistance Sitting-balance support: Feet supported;No upper extremity supported Sitting balance-Leahy Scale: Fair     Standing balance support: Bilateral upper extremity supported Standing balance-Leahy Scale: Poor Standing balance comment: UE support required for static standing                             Pertinent Vitals/Pain Pain Assessment: Faces Faces Pain Scale: Hurts little more Pain Location: Pt does not specify pain location but was grimacing with mobility Pain Descriptors / Indicators: Grimacing Pain Intervention(s): Limited activity within patient's tolerance;Monitored during session;Repositioned    Home Living Family/patient expects to be discharged to:: Inpatient rehab Living Arrangements: Spouse/significant other  Prior Function Level of Independence: Independent with assistive device(s)         Comments: typically uses SPC, ambulatory approx 50 feet, usually on 2L at rest, sometimes increases 3-4L with activity, has pulse oximeter at home     Hand Dominance        Extremity/Trunk Assessment   Upper Extremity Assessment: Generalized weakness            Lower Extremity Assessment: Generalized weakness;LLE deficits/detail   LLE Deficits / Details: Decreased strength and AROM. Pt with pain during mobility and weight bearing.   Cervical / Trunk Assessment: Other exceptions  Communication   Communication: HOH  Cognition Arousal/Alertness: Awake/alert Behavior During Therapy: Flat affect Overall Cognitive Status: Within Functional Limits for tasks assessed                      General Comments      Exercises        Assessment/Plan    PT Assessment Patient needs continued PT services  PT Diagnosis Difficulty walking;Generalized weakness   PT Problem List Decreased strength;Decreased activity tolerance;Decreased range of motion;Decreased balance;Decreased mobility;Decreased knowledge of use of DME;Decreased safety awareness;Decreased knowledge of precautions;Pain  PT Treatment Interventions DME instruction;Gait training;Stair training;Functional mobility training;Therapeutic activities;Therapeutic exercise;Neuromuscular re-education;Patient/family education   PT Goals (Current goals can be found in the Care Plan section) Acute Rehab PT Goals Patient Stated Goal: Return home PT Goal Formulation: With patient/family Time For Goal Achievement: 12/24/14 Potential to Achieve Goals: Good    Frequency Min 3X/week   Barriers to discharge        Co-evaluation               End of Session Equipment Utilized During Treatment: Gait belt;Oxygen Activity Tolerance: Patient limited by fatigue;Patient limited by lethargy Patient left: in chair;with call bell/phone within reach;with family/visitor present;Other (comment) (PA present in room) Nurse Communication: Mobility status         Time: 2951-8841 PT Time Calculation (min) (ACUTE ONLY): 26 min   Charges:   PT Evaluation $Initial PT Evaluation Tier I: 1 Procedure PT Treatments $Therapeutic Activity: 8-22 mins   PT G Codes:        Rolinda Roan Dec 13, 2014, 2:42 PM   Rolinda Roan, PT, DPT Acute Rehabilitation Services Pager: [phone]

## 2014-12-10 NOTE — Progress Notes (Signed)
[facility] Progress Note   Subjective: stable off the vent, had HD last night tolerated well but no volume removal- UOP 500 which is an increase.  Plans for another HD treatment today.    Filed Vitals:   12/10/14 0500 12/10/14 0600 12/10/14 0700 12/10/14 0723  BP: 90/44 95/53 105/54   Pulse: 88 88 87   Temp:    98 F (36.7 C)  TempSrc:    Oral  Resp: 13 21 11    Height:      Weight:      SpO2: 97% 97% 97%    Exam: Extubated, alert No jvd Chest exp wheezing RRR no rub/ gallop Abd obese, nondistended Mild-moderate diffuse edema Neuro alert, feeling better   UNa 51, UCr 136 UA  gran casts, amber, 100 prot, 1.022, 11-20 wbc, no rbc CXR no chg bibasilar airspace dz  Summary: 64 yo w hx COPD on home O2, obesity admitted in April 2015 with acute cholecystitis, too high risk for surgery d/t pulm disease had perc drain placed. Had been managed since with chronic drain with changes per IR.  On 11/03/14 pt was seen by IR for the tube falling out.  They noted  there was not an indication for PCT replacement. On 4/26 patient was admitted w septic shock and gallbladder abcess. Pt was admitted to ICU. IR placed another perc drain on 4/26.  Patient developed AKI/ ATN from shock/ contrast and was treated with CRRT from 4/27-5/6 at [facility].   Assessment: 1. AKI / ATN - from sepsis/ contrast. Baseline creatinine normal. S/P CRRT 4/27-5/6.  Made some urine 150 cc for the first time yesterday. 500 cc UOP today.  S/P HD last night with much improvement in numbers.  I will hold off on HD today and follow, do HD PRN.  But, with normal baseline renal function possibly is turning corner and will not need further ? Vascath in since 4/27.  Still hopeful for recovery  2. Bacteroides sepsis/ gallbladder infection, s/p chole tube - off pressors, on meropenem- per CCM- WBC decreasing 3. Volume excess - moderate,  edema on CXR remains 4. Shock - resolved 5. Anemia- multifactorial- is decreasing- will  add aranesp- not candidate for iron due to previous infection- transfuse PRN       Welton Bord A   12/10/2014, 7:44 AM     Recent Labs Lab 12/08/14 0840 12/09/14 0516 12/09/14 0517 12/10/14 0520  NA 134* 138 138 138  K 4.1 4.5 4.4 3.8  CL 105 102 103 102  CO2 24 21* 22 25  GLUCOSE 106* 89 89 78  BUN 90* 116* 116* 64*  CREATININE 4.55* 6.39* 6.60* 4.65*  CALCIUM 7.9* 8.2* 8.2* 7.9*  PHOS 5.9* 7.7*  --  6.0*    Recent Labs Lab 12/08/14 0840 12/09/14 0516 12/10/14 0520  ALBUMIN 2.1* 1.9* 1.8*    Recent Labs Lab 12/07/14 0520 12/08/14 0530 12/09/14 0517  WBC 20.4* 22.3* 18.0*  NEUTROABS  --   --  14.8*  HGB 9.3* 9.7* 8.8*  HCT 27.3* 29.8* 26.4*  MCV 89.8 91.7 90.4  PLT 237 260 341   . amiodarone  200 mg Oral Daily  . antiseptic oral rinse  7 mL Mouth Rinse BID  . arformoterol  15 mcg Nebulization BID  . budesonide (PULMICORT) nebulizer solution  0.5 mg Nebulization BID  . famotidine  20 mg Oral Daily  . furosemide  120 mg Intravenous 3 times per day  . heparin subcutaneous  5,000 Units Subcutaneous  3 times per day  . ipratropium-albuterol  3 mL Nebulization Q6H  . meropenem (MERREM) IV  500 mg Intravenous Q12H  . sodium chloride  10-40 mL Intracatheter Q12H   . sodium chloride 10 mL/hr at 12/09/14 2000   sodium chloride, sodium chloride, acetaminophen **OR** acetaminophen, albumin human, docusate, feeding supplement (NEPRO CARB STEADY), fentaNYL (SUBLIMAZE) injection, heparin, heparin, lidocaine (PF), lidocaine-prilocaine, ondansetron **OR** ondansetron (ZOFRAN) IV, pentafluoroprop-tetrafluoroeth, RESOURCE THICKENUP CLEAR, sodium chloride, traZODone

## 2014-12-10 NOTE — Progress Notes (Signed)
eLink Physician-Brief Progress Note Patient Name: Allen Green DOB: 1950-10-08 MRN: [id number]   Date of Service  12/10/2014  HPI/Events of Note  Patient on Q 4 hour blood glucose checks. Blood glucose has been 80's to 90's.   eICU Interventions  Will D/C Q 4 hour blood glucose checks.      Intervention Category Intermediate Interventions: Hyperglycemia - evaluation and treatment  [name] 12/10/2014, 12:03 AM

## 2014-12-10 NOTE — Consult Note (Signed)
Reason for Consult: A Fib Referring Physician: [name] is an 64 y.o. male.  HPI:   The patient is a 64 yo male with a history of COPD, tobacco abuse, Chronic pain, GERD, polycythemia, hepatitis B.  He had an Echo on 11/27/14(Poor quality study). LV function not assessed.  In 2012 he had normal LVF and moderately reduced RVF.  Lexiscan in 2012 showed no reversible ischemia.    The patient presented with septic shock, and acute resp failure secondary to cholecystitis and abscess from a dislodged gallbladder drain.  He developed AKI and had CRRT 4/27-5-6.  HD last night.  He was extubated on 5/5.  We are asked to consult for new Aflutter.  The patient reports feeling a little better today.  He has been here since 11/19/2014 and today was the first time he has been out of bed.  Back on 4/30 -5/2 he received several boluses of Amiodarone and is now on 280m daily.  Lopressor 12.5 mg BID started today.  He denies any CP.  Breathing a little better today. +cough and feels very weak.      Past Medical History  Diagnosis Date  . COPD (chronic obstructive pulmonary disease)     Probable - long-standing tobacco abuse  . Chronic pain     Bilateral knees and shoulders  . GERD (gastroesophageal reflux disease)   . 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 SAlen Green . Pneumonia     January 2015  . Hepatitis     Hapatitis B 40 yrs ago    Past Surgical History  Procedure Laterality Date  . Right total knee arthroplasty  2003  . Left total knee arthroplasty  1998    "left knee replacement is broken" - 08/09/13    Family History  Problem Relation Age of Onset  . Diabetes type II Father   . Coronary artery disease Father     MI in his 552s . Diabetes Mother   . Diabetes Brother   . Diabetes Sister   . Asthma Sister     Social History:  reports that he has been smoking Cigarettes.  He started smoking about 52 years ago. He has a 100 pack-year smoking history. He has never used smokeless  tobacco. He reports that he does not drink alcohol or use illicit drugs.  Allergies: No Known Allergies  Medications:  Scheduled Meds: . amiodarone  200 mg Oral Daily  . antiseptic oral rinse  7 mL Mouth Rinse BID  . arformoterol  15 mcg Nebulization BID  . budesonide (PULMICORT) nebulizer solution  0.5 mg Nebulization BID  . darbepoetin (ARANESP) injection - NON-DIALYSIS  100 mcg Subcutaneous Q Mon-1800  . famotidine  20 mg Oral Daily  . furosemide  120 mg Intravenous 3 times per day  . heparin subcutaneous  5,000 Units Subcutaneous 3 times per day  . ipratropium-albuterol  3 mL Nebulization Q6H  . metoprolol tartrate  12.5 mg Oral BID  . sodium chloride  10-40 mL Intracatheter Q12H   Continuous Infusions: . sodium chloride 10 mL/hr at 12/10/14 0800   PRN Meds:.sodium chloride, sodium chloride, acetaminophen **OR** acetaminophen, albumin human, docusate, feeding supplement (NEPRO CARB STEADY), fentaNYL (SUBLIMAZE) injection, heparin, heparin, lidocaine (PF), lidocaine-prilocaine, ondansetron **OR** ondansetron (ZOFRAN) IV, pentafluoroprop-tetrafluoroeth, RESOURCE THICKENUP CLEAR, sodium chloride, traZODone   Results for orders placed or performed during the hospital encounter of 11/27/2014 (from the past 48 hour(s))  Glucose, capillary  Status: None   Collection Time: 12/08/14  4:14 PM  Result Value Ref Range   Glucose-Capillary 86 70 - 99 mg/dL   Comment 1 Notify RN    Comment 2 Document in Chart   Glucose, capillary     Status: None   Collection Time: 12/08/14  8:12 PM  Result Value Ref Range   Glucose-Capillary 79 70 - 99 mg/dL  Glucose, capillary     Status: None   Collection Time: 12/09/14  5:01 AM  Result Value Ref Range   Glucose-Capillary 82 70 - 99 mg/dL  Renal function panel     Status: Abnormal   Collection Time: 12/09/14  5:16 AM  Result Value Ref Range   Sodium 138 135 - 145 mmol/L   Potassium 4.5 3.5 - 5.1 mmol/L   Chloride 102 101 - 111 mmol/L   CO2 21  (L) 22 - 32 mmol/L   Glucose, Bld 89 70 - 99 mg/dL   BUN 116 (H) 6 - 20 mg/dL    Comment: RESULTS CONFIRMED BY MANUAL DILUTION   Creatinine, Ser 6.39 (H) 0.61 - 1.24 mg/dL   Calcium 8.2 (L) 8.9 - 10.3 mg/dL   Phosphorus 7.7 (H) 2.5 - 4.6 mg/dL   Albumin 1.9 (L) 3.5 - 5.0 g/dL   GFR calc non Af Amer 8 (L) >60 mL/min   GFR calc Af Amer 10 (L) >60 mL/min    Comment: (NOTE) The eGFR has been calculated using the CKD EPI equation. This calculation has not been validated in all clinical situations. eGFR's persistently <60 mL/min signify possible Chronic Kidney Disease.    Anion gap 15 5 - 15  Basic metabolic panel     Status: Abnormal   Collection Time: 12/09/14  5:17 AM  Result Value Ref Range   Sodium 138 135 - 145 mmol/L   Potassium 4.4 3.5 - 5.1 mmol/L   Chloride 103 101 - 111 mmol/L   CO2 22 22 - 32 mmol/L   Glucose, Bld 89 70 - 99 mg/dL   BUN 116 (H) 6 - 20 mg/dL    Comment: RESULTS CONFIRMED BY MANUAL DILUTION   Creatinine, Ser 6.60 (H) 0.61 - 1.24 mg/dL   Calcium 8.2 (L) 8.9 - 10.3 mg/dL   GFR calc non Af Amer 8 (L) >60 mL/min   GFR calc Af Amer 9 (L) >60 mL/min    Comment: (NOTE) The eGFR has been calculated using the CKD EPI equation. This calculation has not been validated in all clinical situations. eGFR's persistently <60 mL/min signify possible Chronic Kidney Disease.    Anion gap 13 5 - 15  CBC with Differential/Platelet     Status: Abnormal   Collection Time: 12/09/14  5:17 AM  Result Value Ref Range   WBC 18.0 (H) 4.0 - 10.5 K/uL   RBC 2.92 (L) 4.22 - 5.81 MIL/uL   Hemoglobin 8.8 (L) 13.0 - 17.0 g/dL   HCT 26.4 (L) 39.0 - 52.0 %   MCV 90.4 78.0 - 100.0 fL   MCH 30.1 26.0 - 34.0 pg   MCHC 33.3 30.0 - 36.0 g/dL   RDW 15.7 (H) 11.5 - 15.5 %   Platelets 341 150 - 400 K/uL   Neutrophils Relative % 82 (H) 43 - 77 %   Lymphocytes Relative 9 (L) 12 - 46 %   Monocytes Relative 8 3 - 12 %   Eosinophils Relative 1 0 - 5 %   Basophils Relative 0 0 - 1 %   Neutro  Abs 14.8 (H) 1.7 - 7.7 K/uL   Lymphs Abs 1.6 0.7 - 4.0 K/uL   Monocytes Absolute 1.4 (H) 0.1 - 1.0 K/uL   Eosinophils Absolute 0.2 0.0 - 0.7 K/uL   Basophils Absolute 0.0 0.0 - 0.1 K/uL   Smear Review MORPHOLOGY UNREMARKABLE   Glucose, capillary     Status: None   Collection Time: 12/09/14  8:22 AM  Result Value Ref Range   Glucose-Capillary 93 70 - 99 mg/dL   Comment 1 Notify RN    Comment 2 Document in Chart   Glucose, capillary     Status: Abnormal   Collection Time: 12/09/14 12:45 PM  Result Value Ref Range   Glucose-Capillary 109 (H) 70 - 99 mg/dL   Comment 1 Notify RN    Comment 2 Document in Chart   Glucose, capillary     Status: None   Collection Time: 12/09/14  4:44 PM  Result Value Ref Range   Glucose-Capillary 84 70 - 99 mg/dL  Glucose, capillary     Status: None   Collection Time: 12/09/14  7:11 PM  Result Value Ref Range   Glucose-Capillary 82 70 - 99 mg/dL   Comment 1 Notify RN   Hepatitis B surface antigen     Status: None   Collection Time: 12/09/14  8:15 PM  Result Value Ref Range   Hepatitis B Surface Ag NEGATIVE NEGATIVE    Comment: Performed at [facility]  Hepatitis B core antibody, total     Status: Abnormal   Collection Time: 12/09/14  8:15 PM  Result Value Ref Range   Hep B Core Total Ab Reactive (A) NON REACTIVE    Comment: Performed at [facility]  Hepatitis B surface antibody     Status: None   Collection Time: 12/09/14  8:15 PM  Result Value Ref Range   Hep B S Ab NEGATIVE NEGATIVE    Comment: Performed at [facility]  Glucose, capillary     Status: None   Collection Time: 12/09/14 11:35 PM  Result Value Ref Range   Glucose-Capillary 96 70 - 99 mg/dL   Comment 1 Notify RN   Renal function panel     Status: Abnormal   Collection Time: 12/10/14  5:20 AM  Result Value Ref Range   Sodium 138 135 - 145 mmol/L   Potassium 3.8 3.5 - 5.1 mmol/L   Chloride 102 101 - 111 mmol/L   CO2 25 22 - 32 mmol/L   Glucose, Bld 78  70 - 99 mg/dL   BUN 64 (H) 6 - 20 mg/dL   Creatinine, Ser 4.65 (H) 0.61 - 1.24 mg/dL   Calcium 7.9 (L) 8.9 - 10.3 mg/dL   Phosphorus 6.0 (H) 2.5 - 4.6 mg/dL   Albumin 1.8 (L) 3.5 - 5.0 g/dL   GFR calc non Af Amer 12 (L) >60 mL/min   GFR calc Af Amer 14 (L) >60 mL/min    Comment: (NOTE) The eGFR has been calculated using the CKD EPI equation. This calculation has not been validated in all clinical situations. eGFR's persistently <60 mL/min signify possible Chronic Kidney Disease.    Anion gap 11 5 - 15    No results found.  Review of Systems  Constitutional: Negative for fever and diaphoresis.  HENT: Positive for congestion.   Respiratory: Positive for cough and shortness of breath.   Cardiovascular: Positive for orthopnea. Negative for chest pain, leg swelling and PND.  Gastrointestinal: Negative for nausea, vomiting, abdominal pain,  blood in stool and melena.  Neurological: Positive for weakness. Negative for dizziness.  All other systems reviewed and are negative.  Blood pressure 127/44, pulse 57, temperature 97.6 F (36.4 C), temperature source Oral, resp. rate 22, height 5' 11" (1.803 m), weight 257 lb 8 oz (116.8 kg), SpO2 95 %. Physical Exam  Nursing note and vitals reviewed. Constitutional: He is oriented to person, place, and time. He appears well-developed. No distress.  Obese  HENT:  Head: Normocephalic and atraumatic.  Eyes: EOM are normal. Pupils are equal, round, and reactive to light. No scleral icterus.  Neck: Normal range of motion. Neck supple.  Cardiovascular: Regular rhythm, S1 normal and S2 normal.  Tachycardia present.   No murmur heard. Pulses:      Radial pulses are 2+ on the right side, and 1+ on the left side.       Dorsalis pedis pulses are 1+ on the right side, and 1+ on the left side.  Respiratory: Effort normal. He has wheezes.  Decreased BS throughout.  Wheezing noted on the left side.   GI: Soft. Bowel sounds are normal. He exhibits  distension. There is no tenderness.  Musculoskeletal: He exhibits no edema.  Lymphadenopathy:    He has no cervical adenopathy.  Neurological: He is alert and oriented to person, place, and time. He exhibits normal muscle tone.  Skin: Skin is warm and dry.  Psychiatric: He has a normal mood and affect.    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  Lopressor 12.5 added today.  He has had one dose.  Also on 265m Amiodaone which we will increase to 400daily.  HR at rest is ~110.  Echo was not helpful(very poor quality).  He was in Afib on 4/29.  Continue to titrate lopressor as BP will allow with a goal HR <100. Last Mag was 2.8 on 5/6.  K 3.8.  Repeat limited echo and see if we can assess LVF.   CHADSVASC 2.  We would like to anticoagulate with coumadin.  Consider DCCV as OP.    HTarri Fuller PTarrant5/04/2015, 2:00 PM  Patient seen and examined. I agree with the assessment and plan as detailed above. See also my additional thoughts below.   The patient is recovering from significant illness including his gallbladder disease and acute renal failure. He has had atrial flutter. Amiodarone had been started IV and then switched to 200 mg orally. Ultimately it will be optimal to try to convert him back to regular rhythm. Historically LV function is good. His most recent echo was technically very limited when he was very ill. We will repeat a limited study to see if we can get a better look at his left ventricle. Amiodarone will be increased to 400 mg daily for one week. It would be optimal to anticoagulate him when it  is safe. With his renal dysfunction, the only choice is Coumadin. His atrial flutter rate increases substantially when he tries to move from bed to chair. The higher dose of amiodarone may help with this. Once he is improved and anticoagulated for  4 weeks, we will want to try to convert him back to sinus rhythm.  Dola Argyle, MD, [facility] 12/10/2014 3:04 PM

## 2014-12-10 NOTE — Progress Notes (Signed)
PULMONARY / CRITICAL CARE MEDICINE   Name: Allen Green MRN: [id number] DOB: 10-Oct-1950    ADMISSION DATE:  11/07/2014 CONSULTATION DATE: 4/26  REFERRING MD :  [facility]  CHIEF COMPLAINT:  SOB  INITIAL PRESENTATION: 64 y/o male admitted on 4/26 with septic shock and acute respiratory failure due to cholecystitis and an abscess associated with a recently dislodged gallbladder drain. Course complicated by bibasal HCAP with severe hypoxemia and severe autoPEEP  STUDIES:  4/26 CT Abdomen> acute cholecystitis and gallbladder tract abscess 4/26 ECHO> poor study, can't comment on LV or RV size/function 4/29 CT abdomen >> Mildly prominent central small bowel loops in the lower abdomen and upper pelvis , decompressed gallbladder. 5/1 ven duplex neg bLE  SIGNIFICANT EVENTS: 4/26 intubated, Gallbladder drain placed by IR 4/29 multiple amio bolus for RVR 4/30 worsening hypoxia 5/3 improving pressor needs 5/4 SBTs 5/5 extubated 5/6 off CVVHD 5/8 transfer to [facility] for intermittent HD  SUBJECTIVE:   remains weak afebrile Denies pain   VITAL SIGNS: Temp:  [97.4 F (36.3 C)-98 F (36.7 C)] 98 F (36.7 C) (05/09 0723) Pulse Rate:  [39-130] 88 (05/09 0800) Resp:  [11-21] 21 (05/09 0800) BP: (90-146)/(38-88) 109/57 mmHg (05/09 0800) SpO2:  [90 %-98 %] 98 % (05/09 0813) Weight:  [257 lb 8 oz (116.8 kg)-257 lb 15 oz (117 kg)] 257 lb 8 oz (116.8 kg) (05/08 2343) HEMODYNAMICS:   VENTILATOR SETTINGS:   INTAKE / OUTPUT:  Intake/Output Summary (Last 24 hours) at 12/10/14 0906 Last data filed at 12/10/14 0800  Gross per 24 hour  Intake    564 ml  Output    720 ml  Net   -156 ml    PHYSICAL EXAMINATION: General:  Comfortable in bed HENT :NCAT OP clear, CVL and HD cath in place Eyes: sclera anicteric PULM: decreased BS bases, normal effort CV: Regular rate, distant heart sounds GI: BS+, soft, nontender MSK: normal bulk and tone NEuro: A&Ox4, maew  LABS:  CBC  Recent Labs Lab  12/07/14 0520 12/08/14 0530 12/09/14 0517  WBC 20.4* 22.3* 18.0*  HGB 9.3* 9.7* 8.8*  HCT 27.3* 29.8* 26.4*  PLT 237 260 341   Coag's  Recent Labs Lab 12/07/14 0755 12/07/14 1155 12/07/14 2100  APTT 84* 63* 33   BMET  Recent Labs Lab 12/09/14 0516 12/09/14 0517 12/10/14 0520  NA 138 138 138  K 4.5 4.4 3.8  CL 102 103 102  CO2 21* 22 25  BUN 116* 116* 64*  CREATININE 6.39* 6.60* 4.65*  GLUCOSE 89 89 78   Electrolytes  Recent Labs Lab 12/05/14 0406  12/06/14 0536  12/07/14 0520 12/08/14 0840 12/09/14 0516 12/09/14 0517 12/10/14 0520  CALCIUM  --   < >  --   < >  --  7.9* 8.2* 8.2* 7.9*  MG 2.4  --  2.7*  --  2.8*  --   --   --   --   PHOS  --   < >  --   < >  --  5.9* 7.7*  --  6.0*  < > = values in this interval not displayed. Sepsis Markers  Recent Labs Lab 12/05/14 0406 12/06/14 0536 12/07/14 0520  PROCALCITON 2.25 1.29 0.69   ABG  Recent Labs Lab 12/04/14 0530 12/06/14 0930  PHART 7.382 7.392  PCO2ART 49.5* 45.7*  PO2ART 136* 76.1*   Liver Enzymes  Recent Labs Lab 12/08/14 0840 12/09/14 0516 12/10/14 0520  ALBUMIN 2.1* 1.9* 1.8*   Cardiac Enzymes No  results for input(s): TROPONINI, PROBNP in the last 168 hours. Glucose  Recent Labs Lab 12/09/14 0501 12/09/14 0822 12/09/14 1245 12/09/14 1644 12/09/14 1911 12/09/14 2335  GLUCAP 82 93 109* 84 82 96    Imaging CXR 5/6 bibasal pna/ effusions    ASSESSMENT / PLAN:  PULMONARY OETT 4/26(PCCM)>>5/5 A: Acute respiratory failure with hypercapnea> extubated 5/7 Severe COPD, but did OK with mechanical ventilation so elective ex-lap not unreasonable Tobacco abuse BLL HCAP P:   BD's OOB Contniue pulmicort/brovana  Continue duoneb  CARDIOVASCULAR CVL 4/26 L IJ CVL>> R IJ HD cath 4/28 >>  A:  Septic shock > resolved AF-RVR  P:  Pressors off amiodarone po Add lopressor PO   RENAL Lab Results  Component Value Date   CREATININE 4.65* 12/10/2014   CREATININE  6.60* 12/09/2014   CREATININE 6.39* 12/09/2014    Recent Labs Lab 12/09/14 0516 12/09/14 0517 12/10/14 0520  K 4.5 4.4 3.8      A: Oliguric AKI -CVVH 4/27 > 5/6 4/27 HD cath >> P:   Monitor BMET and UOP Renal following -iHD per Renal  GASTROINTESTINAL A:   Acute Cholecystis > with peri-gallbladder abscess, s/p drain 4/26 per IR Recent IR drain 4/3->4/12  Ileus S/p reglan  Diarrhea> c.diff neg x2 P:   Monitor drain output Continue diet as ordered Continue h2 blocker oral for SUP Need final recomm from gen surg  HEMATOLOGIC A:   Chronic polycythemia - now anemic P:  Follow H/H Monitor for bleeding  INFECTIOUS A:   Septic shock> worse 4/29 likely all related to draining abscess on 4/26 P:   BCx2  4/26>>bacteroides Sputum 4/26>>nl flora Wound culture 4/26 > bacteroides 4/26 c dif>>neg 5/3 resp cx >> candida Abx:  4/26 vanc>> 4/30 4/26 zoysn>>4/29 4/27 cipro>> 5/1 4/29 meropenem >>  5/9  ENDOCRINE CBG (last 3)   Recent Labs  12/09/14 1644 12/09/14 1911 12/09/14 2335  GLUCAP 84 82 96    A:   No acute issue P:   SSI  NEUROLOGIC A:  Insomnia Severe agitation/ asynchrony > resolved  P:   fent prn  Add trazodone  PT consult   FAMILY  - Updates: updated wife 5/9  Transfer to tele & [facility] 5/10  The patient is critically ill with multiple organ systems failure and requires high complexity decision making for assessment and support, frequent evaluation and titration of therapies, application of advanced monitoring technologies and extensive interpretation of multiple databases. Critical Care Time devoted to patient care services described in this note independent of APP time is 31 minutes.    Kara Mead MD. Shade Flood. [facility] Pager [phone] If no response call [phone]    12/10/2014, 9:06 AM

## 2014-12-11 DIAGNOSIS — R5381 Other malaise: Secondary | ICD-10-CM

## 2014-12-11 LAB — CBC
HEMATOCRIT: 28.5 % — AB (ref 39.0–52.0)
Hemoglobin: 9.4 g/dL — ABNORMAL LOW (ref 13.0–17.0)
MCH: 29.8 pg (ref 26.0–34.0)
MCHC: 33 g/dL (ref 30.0–36.0)
MCV: 90.5 fL (ref 78.0–100.0)
Platelets: 324 10*3/uL (ref 150–400)
RBC: 3.15 MIL/uL — AB (ref 4.22–5.81)
RDW: 15.9 % — AB (ref 11.5–15.5)
WBC: 15.7 10*3/uL — AB (ref 4.0–10.5)

## 2014-12-11 LAB — RENAL FUNCTION PANEL
ANION GAP: 14 (ref 5–15)
Albumin: 1.8 g/dL — ABNORMAL LOW (ref 3.5–5.0)
BUN: 90 mg/dL — AB (ref 6–20)
CO2: 21 mmol/L — AB (ref 22–32)
Calcium: 8.1 mg/dL — ABNORMAL LOW (ref 8.9–10.3)
Chloride: 102 mmol/L (ref 101–111)
Creatinine, Ser: 6.7 mg/dL — ABNORMAL HIGH (ref 0.61–1.24)
GFR calc Af Amer: 9 mL/min — ABNORMAL LOW (ref >60)
GFR calc non Af Amer: 8 mL/min — ABNORMAL LOW (ref >60)
Glucose, Bld: 95 mg/dL (ref 70–99)
PHOSPHORUS: 6.5 mg/dL — AB (ref 2.5–4.6)
POTASSIUM: 3.7 mmol/L (ref 3.5–5.1)
Sodium: 137 mmol/L (ref 135–145)

## 2014-12-11 LAB — TROPONIN I: Troponin I: <0.03 ng/mL (ref <0.031)

## 2014-12-11 MED ORDER — ALTEPLASE 2 MG IJ SOLR
2.0000 mg | Freq: Once | INTRAMUSCULAR | Status: DC | PRN
  Filled 2014-12-11: qty 2

## 2014-12-11 MED ORDER — SODIUM CHLORIDE 0.9 % IV SOLN: 100.0000 mL | INTRAVENOUS | Status: DC | PRN

## 2014-12-11 MED ORDER — AMIODARONE IV BOLUS ONLY 150 MG/100ML
150.0000 mg | Freq: Once | INTRAVENOUS | Status: AC
  Administered 2014-12-11: 150 mg via INTRAVENOUS
  Filled 2014-12-11: qty 100

## 2014-12-11 MED ORDER — METOPROLOL TARTRATE 25 MG PO TABS
25.0000 mg | ORAL_TABLET | Freq: Two times a day (BID) | ORAL | Status: DC
  Administered 2014-12-11 – 2014-12-16 (×9): 25 mg via ORAL
  Filled 2014-12-11 (×13): qty 1

## 2014-12-11 MED ORDER — DILTIAZEM HCL 100 MG IV SOLR
5.0000 mg/h | INTRAVENOUS | Status: DC
  Administered 2014-12-11: 15 mg/h via INTRAVENOUS
  Administered 2014-12-11 – 2014-12-12 (×2): 5 mg/h via INTRAVENOUS
  Filled 2014-12-11 (×2): qty 100

## 2014-12-11 MED ORDER — LIDOCAINE HCL (PF) 1 % IJ SOLN: 5.0000 mL | INTRAMUSCULAR | Status: DC | PRN

## 2014-12-11 MED ORDER — NEPRO/CARBSTEADY PO LIQD
237.0000 mL | Freq: Two times a day (BID) | ORAL | Status: DC
  Administered 2014-12-11 – 2014-12-18 (×9): 237 mL via ORAL
  Filled 2014-12-11 (×18): qty 237

## 2014-12-11 MED ORDER — DILTIAZEM HCL 100 MG IV SOLR
10.0000 mg | Freq: Once | INTRAVENOUS | Status: DC
  Filled 2014-12-11: qty 100

## 2014-12-11 MED ORDER — BOOST / RESOURCE BREEZE PO LIQD
1.0000 | Freq: Every day | ORAL | Status: DC
  Administered 2014-12-11 – 2014-12-16 (×5): 1 via ORAL

## 2014-12-11 MED ORDER — PENTAFLUOROPROP-TETRAFLUOROETH EX AERO: 1.0000 "application " | INHALATION_SPRAY | CUTANEOUS | Status: DC | PRN

## 2014-12-11 MED ORDER — TRAZODONE HCL 100 MG PO TABS
100.0000 mg | ORAL_TABLET | Freq: Every evening | ORAL | Status: DC | PRN
  Administered 2014-12-11 – 2014-12-18 (×5): 100 mg via ORAL
  Filled 2014-12-11 (×6): qty 1

## 2014-12-11 MED ORDER — HEPARIN SODIUM (PORCINE) 1000 UNIT/ML DIALYSIS: 1000.0000 [IU] | INTRAMUSCULAR | Status: DC | PRN

## 2014-12-11 MED ORDER — LIDOCAINE-PRILOCAINE 2.5-2.5 % EX CREA: 1.0000 "application " | TOPICAL_CREAM | CUTANEOUS | Status: DC | PRN

## 2014-12-11 MED ORDER — FENTANYL CITRATE (PF) 100 MCG/2ML IJ SOLN
INTRAMUSCULAR | Status: AC
  Filled 2014-12-11: qty 2

## 2014-12-11 MED ORDER — HEPARIN SODIUM (PORCINE) 1000 UNIT/ML DIALYSIS
20.0000 [IU]/kg | INTRAMUSCULAR | Status: DC | PRN
  Filled 2014-12-11: qty 3

## 2014-12-11 MED ORDER — NEPRO/CARBSTEADY PO LIQD: 237.0000 mL | ORAL | Status: DC | PRN

## 2014-12-11 MED ORDER — SODIUM CHLORIDE 0.9 % IV BOLUS (SEPSIS)
500.0000 mL | Freq: Once | INTRAVENOUS | Status: AC
  Administered 2014-12-11: 500 mL via INTRAVENOUS

## 2014-12-11 NOTE — Progress Notes (Signed)
Patient Name: Allen Green Date of Encounter: 12/11/2014   SUBJECTIVE  Had dialysis in morning. Denies CP or palpitation. Breathing mask in place. Complaining of weakness.   CURRENT MEDS . amiodarone  400 mg Oral Daily  . antiseptic oral rinse  7 mL Mouth Rinse BID  . arformoterol  15 mcg Nebulization BID  . budesonide (PULMICORT) nebulizer solution  0.5 mg Nebulization BID  . darbepoetin (ARANESP) injection - NON-DIALYSIS  100 mcg Subcutaneous Q Mon-1800  . famotidine  20 mg Oral Daily  . feeding supplement (NEPRO CARB STEADY)  237 mL Oral BID BM  . feeding supplement (RESOURCE BREEZE)  1 Container Oral Q1500  . furosemide  120 mg Intravenous 3 times per day  . heparin subcutaneous  5,000 Units Subcutaneous 3 times per day  . ipratropium-albuterol  3 mL Nebulization Q6H  . metoprolol tartrate  12.5 mg Oral BID  . sodium chloride  10-40 mL Intracatheter Q12H    OBJECTIVE  Filed Vitals:   12/11/14 1200 12/11/14 1230 12/11/14 1300 12/11/14 1310  BP: 121/64 94/60 91/62  107/65  Pulse: 87 87 86 86  Temp:    98 F (36.7 C)  TempSrc:    Oral  Resp:   18 18  Height:      Weight:    254 lb 3.1 oz (115.3 kg)  SpO2:    98%    Intake/Output Summary (Last 24 hours) at 12/11/14 1402 Last data filed at 12/11/14 1310  Gross per 24 hour  Intake    185 ml  Output   1555 ml  Net  -1370 ml   Filed Weights   12/10/14 2056 12/11/14 0930 12/11/14 1310  Weight: 260 lb 11.2 oz (118.253 kg) 258 lb 13.1 oz (117.4 kg) 254 lb 3.1 oz (115.3 kg)    PHYSICAL EXAM  General: Pleasant, obese male. Laying in bed. Breathing mask in place.  Neuro: Alert and oriented X 3.  Psych: Normal affect. HEENT: NCAT OP clear, CVL and HD cath in place Neck: Supple without bruits or JVD. Lungs:  Resp regular and unlabored. Decreased breath sound throughout. Scattered wheezing.  Heart: regular rhythm. Tachycardic. No murmurs. Abdomen: Soft, non-tender. distended, BS + x 4.  Extremities: No clubbing,  cyanosis or edema. Radials 2+ and equal bilaterally. LE pulse 1+ bilaterally. Feet were cold. Compression stocking in place.   Accessory Clinical Findings  CBC  Recent Labs  12/09/14 0517 12/11/14 0558  WBC 18.0* 15.7*  NEUTROABS 14.8*  --   HGB 8.8* 9.4*  HCT 26.4* 28.5*  MCV 90.4 90.5  PLT 341 710   Basic Metabolic Panel  Recent Labs  12/10/14 0520 12/11/14 0558  NA 138 137  K 3.8 3.7  CL 102 102  CO2 25 21*  GLUCOSE 78 95  BUN 64* 90*  CREATININE 4.65* 6.70*  CALCIUM 7.9* 8.1*  PHOS 6.0* 6.5*   Liver Function Tests  Recent Labs  12/10/14 0520 12/11/14 0558  ALBUMIN 1.8* 1.8*     TELE  A.flutter with rate in 110s to 150s.   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 He has had atrial flutter. Amiodarone had been started IV and then switched to 200 mg orally, increased 12/10/14 to 400 mg daily for one week (today is day 2). Lopressor 12.5 mg BID started yesterday. Continue to titrate lopressor as BP will allow with a goal HR <100. Pulse and BP are relatively stable. However tele showed rate above 110s. Will continue to follow. Previous had normal LV function. Most recent echo was very limited. Pending repeat echo. CHADSVASC 2. Optimal to anticoagulate with warfarin. May try to convert him back to sinus rhythm, once he is improved and after 4 weeks of anticoagulation. Patient feet were cold, however pulse are palpable. Had normal LE venous duplex 12/02/14. Stocking in place.   Signed, Bhagat,Bhavinkumar  PA-C Pager [phone]  I have examined the patient and reviewed assessment and plan and discussed with patient.  Agree with above as stated.  When I came to see the patient, he had gone back to atrial flutter with rapid ventricular response. The nurse reported that he was transiently confused before I got there is well. He is being moved back to stepdown. A bolus of IV amiodarone was given that his heart rate elevation persisted. Will start IV diltiazem to be titrated for rate less than 100. Hopefully, blood pressure will tolerate this.  Hafsah Hendler S.

## 2014-12-11 NOTE — Progress Notes (Signed)
Spoke to Dr. Oletta Darter about order to bolus cardizem and start gtt. BP 99/60. Ordered NS bolus and no cardizem bolus at this time. Will give and continue to monitor.

## 2014-12-11 NOTE — Progress Notes (Signed)
Pt will benefit from a rehab stay once determined medically ready. Noted pt began CRRT 4/27 and received dialysis today for AKI/ATN. I will follow his progress to assist with planning dispo when medically ready. I have not met with pt's family as RN assessing pt at bedside currently. [phone]

## 2014-12-11 NOTE — Procedures (Signed)
Patient was seen on dialysis and the procedure was supervised.  BFR 400  Via PC BP is  110/59.   Patient appears to be tolerating treatment well  Brennon Otterness A 12/11/2014

## 2014-12-11 NOTE — Consult Note (Signed)
Physical Medicine and Rehabilitation Consult Reason for Consult: Septic shock/ARF due to cholecystitis Referring Physician: Critical care   HPI: Allen Green is a 64 y.o. right handed male with history of COPD/tobacco abuse with chronic oxygen, polycythemia. Patient lives with his wife was independent prior to admission. Presented 11/27/2014 with diffuse abdominal pain. Patient with recent placement of percutaneous cholecystostomy tube 11/03/2013 to treat cholelithiasis and cholecystitis per interventional radiology and discharged home 11/05/2013. He was not a surgical candidate for cholecystectomy due to severe COPD. Percutaneous Drain tube accidentally dislodged with follow-up and interventional radiology opted to leave tube out because there was no evidence of recurrent cholecystitis. Follow-up CT abdomen and pelvis 11/27/2014 showed developing recurrent gallbladder distention suspect sepsis with subcutaneous abscess and old drain tract. New drain was placed in gallbladder again per interventional radiology. Hospital course acute renal failure suspect secondary to septic shock with creatinine 4.22 and renal service  consulted. Patient has received intermittent bouts of hemodialysis with slow recovery of kidney function with latest creatinine 4.65. Cardiology consulted for atrial fibrillation recent echocardiogram limited quality study left and right ventricular function could not be assessed. Low-dose beta blocker and amiodarone was added. Maintained on subcutaneous heparin for DVT prophylaxis. Physical therapy evaluation completed 12/10/2014 with recommendations of physical medicine rehabilitation consult.   Review of Systems  Respiratory: Positive for shortness of breath.   Gastrointestinal: Positive for abdominal pain.       GERD  Musculoskeletal: Positive for myalgias and joint pain.  All other systems reviewed and are negative.  Past Medical History  Diagnosis Date  . COPD (chronic  obstructive pulmonary disease)     Probable - long-standing tobacco abuse  . Chronic pain     Bilateral knees and shoulders  . GERD (gastroesophageal reflux disease)   . 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 [name]  . Pneumonia     January 2015  . Hepatitis     Hapatitis B 40 yrs ago   Past Surgical History  Procedure Laterality Date  . Right total knee arthroplasty  2003  . Left total knee arthroplasty  1998    "left knee replacement is broken" - 08/09/13   Family History  Problem Relation Age of Onset  . Diabetes type II Father   . Coronary artery disease Father     MI in his 41s  . Diabetes Mother   . Diabetes Brother   . Diabetes Sister   . Asthma Sister    Social History:  reports that he has been smoking Cigarettes.  He started smoking about 52 years ago. He has a 100 pack-year smoking history. He has never used smokeless tobacco. He reports that he does not drink alcohol or use illicit drugs. Allergies: No Known Allergies Medications Prior to Admission  Medication Sig Dispense Refill  . albuterol (VENTOLIN HFA) 108 (90 BASE) MCG/ACT inhaler Inhale 2 puffs into the lungs every 4 (four) hours as needed for wheezing or shortness of breath. 8 g 1  . benzonatate (TESSALON) 200 MG capsule Take 200 mg by mouth 3 (three) times daily as needed for cough.    . tiotropium (SPIRIVA HANDIHALER) 18 MCG inhalation capsule Place 1 capsule (18 mcg total) into inhaler and inhale daily. 30 capsule 3  . albuterol (PROVENTIL) (2.5 MG/3ML) 0.083% nebulizer solution Take 3 mLs (2.5 mg total) by nebulization every 6 (six) hours as needed for wheezing or shortness of breath. (Patient not taking: Reported on 11/11/2014) 75 mL  12  . azithromycin (ZITHROMAX) 250 MG tablet 250mg  daily starting tomorrow (11/11/14), for 4 day (Patient not taking: Reported on 11/19/2014) 4 each 0  . chlorpheniramine (CHLOR-TRIMETON) 4 MG tablet Take 1 tablet (4 mg total) by mouth every 6 (six) hours as needed for  allergies. (Patient not taking: Reported on 09/16/2014) 45 tablet 0  . fluticasone (FLONASE) 50 MCG/ACT nasal spray PLACE 2 SPRAYS INTO THE NOSE 2 (TWO) TIMES DAILY. (Patient not taking: Reported on 09/16/2014) 16 g 0  . mometasone-formoterol (DULERA) 200-5 MCG/ACT AERO Inhale 2 puffs into the lungs 2 (two) times daily. 13 g 3  . nicotine (NICODERM CQ - DOSED IN MG/24 HOURS) 21 mg/24hr patch Place 1 patch (21 mg total) onto the skin daily. (Patient not taking: Reported on 11/11/2014) 28 patch 0  . predniSONE (DELTASONE) 20 MG tablet 3 tabs po daily x 3 days, then 2 tabs x 3 days, then 1.5 tabs x 3 days, then 1 tab x 3 days, then 0.5 tabs x 3 days 27 tablet 0  . SPIRIVA HANDIHALER 18 MCG inhalation capsule USE 1 PUFF DAILY (Patient not taking: Reported on 11/27/2014) 30 capsule 0    Home: Home Living Family/patient expects to be discharged to:: Inpatient rehab Living Arrangements: Spouse/significant other  Functional History: Prior Function Level of Independence: Independent with assistive device(s) Comments: typically uses SPC, ambulatory approx 50 feet, usually on 2L at rest, sometimes increases 3-4L with activity, has pulse oximeter at home Functional Status:  Mobility: Bed Mobility Overal bed mobility: Needs Assistance Bed Mobility: Rolling, Sidelying to Sit Rolling: Mod assist Sidelying to sit: Mod assist, +2 for physical assistance General bed mobility comments: Hand-over-hand assist to reach and grab bed rails. Assist to complete rolling and elevate trunk to full sitting position.  Transfers Overall transfer level: Needs assistance Equipment used: Rolling walker (2 wheeled) Transfers: Sit to/from Stand, Stand Pivot Transfers Sit to Stand: Min assist, +2 safety/equipment Stand pivot transfers: Min assist, +2 physical assistance, +2 safety/equipment General transfer comment: Assist for pt to power-up to full standing. Pt was initially able to stand ~2 minutes for peri-care and fatigued  quickly, requiring a seated rest break. ~5 minute seated rest was required before pt was ready to attempt SPT to chair. Assist was required for walker placement and VC's for sequencing and technique for pt to take pivotal steps around to the recliner.  Ambulation/Gait General Gait Details: Pt was unable to progress to gait training this session.     ADL:    Cognition: Cognition Overall Cognitive Status: Within Functional Limits for tasks assessed Orientation Level: Oriented X4 Cognition Arousal/Alertness: Awake/alert Behavior During Therapy: Flat affect Overall Cognitive Status: Within Functional Limits for tasks assessed  Blood pressure 113/50, pulse 76, temperature 97.8 F (36.6 C), temperature source Oral, resp. rate 20, height 5\' 11"  (1.803 m), weight 118.253 kg (260 lb 11.2 oz), SpO2 94 %. Physical Exam  Constitutional: He is oriented to person, place, and time.  HENT:  Head: Normocephalic.  Eyes: EOM are normal.  Neck: Normal range of motion. Neck supple. No thyromegaly present.  Cardiovascular: Normal rate and regular rhythm.   Respiratory: Effort normal and breath sounds normal.  GI: Soft. There is no tenderness.  Cholecystostomy tube in place  Neurological: He is alert and oriented to person, place, and time.  Follows commands. No gross sensory deficits. Cognitively appears intact. UE: 3+ prox to 4/5 distally bilaterally. RLE: 2+/5 hf, 3/5 ke and 4/5 ankle. LLE: 1/5 hip/knee and 4- ankle  Psychiatric: He has a normal mood and affect. His behavior is normal.    No results found for this or any previous visit (from the past 24 hour(s)). No results found.  Assessment/Plan: Diagnosis: severe debilitation after sepsis/multiple medical 1. Does the need for close, 24 hr/day medical supervision in concert with the patient's rehab needs make it unreasonable for this patient to be served in a less intensive setting? Yes 2. Co-Morbidities requiring supervision/potential  complications: failed Left TKA, COPD, aflutter 3. Due to bladder management, bowel management, safety, skin/wound care, disease management, medication administration, pain management and patient education, does the patient require 24 hr/day rehab nursing? Yes 4. Does the patient require coordinated care of a physician, rehab nurse, PT (1-2 hrs/day, 5 days/week) and OT (1-2 hrs/day, 5 days/week) to address physical and functional deficits in the context of the above medical diagnosis(es)? Yes Addressing deficits in the following areas: balance, endurance, locomotion, strength, transferring, bowel/bladder control, bathing, dressing, feeding, grooming, toileting and psychosocial support 5. Can the patient actively participate in an intensive therapy program of at least 3 hrs of therapy per day at least 5 days per week? Yes 6. The potential for patient to make measurable gains while on inpatient rehab is excellent 7. Anticipated functional outcomes upon discharge from inpatient rehab are modified independent and supervision  with PT, modified independent and supervision with OT, n/a with SLP. 8. Estimated rehab length of stay to reach the above functional goals is: 7-10 days 9. Does the patient have adequate social supports and living environment to accommodate these discharge functional goals? Yes 10. Anticipated D/C setting: Home 11. Anticipated post D/C treatments: HH therapy and Outpatient therapy 12. Overall Rehab/Functional Prognosis: excellent  RECOMMENDATIONS: This patient's condition is appropriate for continued rehabilitative care in the following setting: CIR Patient has agreed to participate in recommended program. Yes Note that insurance prior authorization may be required for reimbursement for recommended care.  Comment: Rehab Admissions Coordinator to follow up.  Thanks,  Meredith Staggers, MD, Mellody Drown     12/11/2014

## 2014-12-11 NOTE — Progress Notes (Signed)
eLink Physician-Brief Progress Note Patient Name: OLANDO WILLEMS DOB: 02/08/1951 MRN: [id number]   Date of Service  12/11/2014  HPI/Events of Note  Hypotension. BP = 99/60.   eICU Interventions  Will order 0.9 NaCl 500 mL IV over 30 minutes now.      Intervention Category Intermediate Interventions: Hypotension - evaluation and management  Deshara Rossi Eugene 12/11/2014, 7:04 PM

## 2014-12-11 NOTE — Progress Notes (Signed)
PCCM Interval Progress Note  Called by Warren Lacy MD and asked to assess pt at bedside.  Pt was supposed to be transferred to [facility] service as of 12/11/14; however, was not for unknown reasons.  Pt with Aflutter now with RVR in 150's.  Had full HD session this AM. Currently on PO amio (increased to 400mg  daily on 5/9 and also on lopressor 12.5mg  BID started 5/9. Not on anticoagulation yet, seen by cardiology and awaiting initiation of warfarin.  He denies any chest pain, SOB.  States he feels "so - so", can't pin point any particular complaints but just feels ill.   VITAL SIGNS: Temp:  [97.6 F (36.4 C)-98.2 F (36.8 C)] 97.6 F (36.4 C) (05/10 1706) Pulse Rate:  [56-158] 143 (05/10 1706) Resp:  [16-23] 22 (05/10 1427) BP: (89-134)/(48-77) 109/72 mmHg (05/10 1706) SpO2:  [90 %-98 %] 95 % (05/10 1706) Weight:  [115.3 kg (254 lb 3.1 oz)-118.253 kg (260 lb 11.2 oz)] 115.3 kg (254 lb 3.1 oz) (05/10 1310)  PHYSICAL EXAM: General:  Adult male, in NAD. Neuro:  A&O x 3, MAE's. Cardiovascular:  IRIR, no M/R/G. Lungs:  Faint expiratory wheezes. Abdomen:  Soft, NT/ND.  Musculoskeletal:  No edema.   IMPRESSION / PLAN:  A.flutter with RVR Plan: Transfer to SDU. Amio bolus x 1.  If no improvement in HR then consider re-bolus with amio given borderline BP's. If BP improves then will consider diltiazem if HR remains elevated. STAT labs including BMP, Mag, Troponins.  COPD with bronchospasm on exam Plan: Continue BD's. Defer steroids for now.   Montey Hora, [facility] Pgr: [phone]  or [phone] 12/11/2014, 5:51 PM

## 2014-12-11 NOTE — Progress Notes (Signed)
PULMONARY / CRITICAL CARE MEDICINE   Name: Allen Green MRN: [id number] DOB: [birth year]    ADMISSION DATE:  11/18/2014 CONSULTATION DATE: 4/26  REFERRING MD :  [facility]  CHIEF COMPLAINT:  SOB  INITIAL PRESENTATION: 64 y/o male admitted on 4/26 with septic shock and acute respiratory failure due to cholecystitis and an abscess associated with a recently dislodged gallbladder drain. Course complicated by bibasal HCAP with severe hypoxemia and severe autoPEEP  STUDIES:  4/26 CT Abdomen> acute cholecystitis and gallbladder tract abscess 4/26 ECHO> poor study, can't comment on LV or RV size/function 4/29 CT abdomen >> Mildly prominent central small bowel loops in the lower abdomen and upper pelvis , decompressed gallbladder. 5/1 ven duplex neg bLE  SIGNIFICANT EVENTS: 4/26 intubated, Gallbladder drain placed by IR 4/29 multiple amio bolus for RVR 4/30 worsening hypoxia 5/3 improving pressor needs 5/4 SBTs 5/5 extubated 5/6 off CVVHD 5/8 transfer to [facility] for intermittent HD  SUBJECTIVE:  Examined on hemodialysis remains weak afebrile Denies pain   VITAL SIGNS: Temp:  [97.6 F (36.4 C)-98.2 F (36.8 C)] 97.6 F (36.4 C) (05/10 1706) Pulse Rate:  [56-158] 143 (05/10 1706) Resp:  [16-23] 22 (05/10 1427) BP: (89-134)/(48-77) 109/72 mmHg (05/10 1706) SpO2:  [90 %-98 %] 95 % (05/10 1706) Weight:  [254 lb 3.1 oz (115.3 kg)-260 lb 11.2 oz (118.253 kg)] 254 lb 3.1 oz (115.3 kg) (05/10 1310) HEMODYNAMICS:   VENTILATOR SETTINGS:   INTAKE / OUTPUT:  Intake/Output Summary (Last 24 hours) at 12/11/14 1743 Last data filed at 12/11/14 1428  Gross per 24 hour  Intake    240 ml  Output   1955 ml  Net  -1715 ml    PHYSICAL EXAMINATION: General:  Comfortable in bed HENT :NCAT OP clear, HD cath in place Eyes: sclera anicteric PULM: decreased BS bases, normal effort CV: Regular rate, distant heart sounds GI: BS+, soft, nontender MSK: normal bulk and tone NEuro: A&Ox4,  maew  LABS:  CBC  Recent Labs Lab 12/08/14 0530 12/09/14 0517 12/11/14 0558  WBC 22.3* 18.0* 15.7*  HGB 9.7* 8.8* 9.4*  HCT 29.8* 26.4* 28.5*  PLT 260 341 324   Coag's  Recent Labs Lab 12/07/14 0755 12/07/14 1155 12/07/14 2100  APTT 84* 63* 33   BMET  Recent Labs Lab 12/09/14 0517 12/10/14 0520 12/11/14 0558  NA 138 138 137  K 4.4 3.8 3.7  CL 103 102 102  CO2 22 25 21*  BUN 116* 64* 90*  CREATININE 6.60* 4.65* 6.70*  GLUCOSE 89 78 95   Electrolytes  Recent Labs Lab 12/05/14 0406  12/06/14 0536  12/07/14 0520  12/09/14 0516 12/09/14 0517 12/10/14 0520 12/11/14 0558  CALCIUM  --   < >  --   < >  --   < > 8.2* 8.2* 7.9* 8.1*  MG 2.4  --  2.7*  --  2.8*  --   --   --   --   --   PHOS  --   < >  --   < >  --   < > 7.7*  --  6.0* 6.5*  < > = values in this interval not displayed. Sepsis Markers  Recent Labs Lab 12/05/14 0406 12/06/14 0536 12/07/14 0520  PROCALCITON 2.25 1.29 0.69   ABG  Recent Labs Lab 12/06/14 0930  PHART 7.392  PCO2ART 45.7*  PO2ART 76.1*   Liver Enzymes  Recent Labs Lab 12/09/14 0516 12/10/14 0520 12/11/14 0558  ALBUMIN 1.9* 1.8* 1.8*  Cardiac Enzymes No results for input(s): TROPONINI, PROBNP in the last 168 hours. Glucose  Recent Labs Lab 12/09/14 0501 12/09/14 0822 12/09/14 1245 12/09/14 1644 12/09/14 1911 12/09/14 2335  GLUCAP 82 93 109* 84 82 96    Imaging CXR 5/6 bibasal pna/ effusions    ASSESSMENT / PLAN:  PULMONARY OETT 4/26(PCCM)>>5/5 A: Acute respiratory failure with hypercapnea> extubated 5/7 Severe COPD, but did OK with mechanical ventilation so elective ex-lap not unreasonable Tobacco abuse BLL HCAP P:   BD's OOB Contniue pulmicort/brovana  Continue duoneb  CARDIOVASCULAR CVL 4/26 L IJ CVL>> 5/9 R IJ HD cath 4/28 >>  A:  Septic shock > resolved AF-RVR  P:  Pressors off amiodarone po Titrate lopressor PO   RENAL Lab Results  Component Value Date   CREATININE  6.70* 12/11/2014   CREATININE 4.65* 12/10/2014   CREATININE 6.60* 12/09/2014    Recent Labs Lab 12/09/14 0517 12/10/14 0520 12/11/14 0558  K 4.4 3.8 3.7      A: Oliguric AKI -CVVH 4/27 > 5/6 4/27 HD cath >> P:   Monitor BMET and UOP Renal following -iHD per Renal, plan for permacath unless still hoping for renal recovery  GASTROINTESTINAL A:   Acute Cholecystis > with peri-gallbladder abscess, s/p drain 4/26 per IR Recent IR drain 4/3->4/12  Ileus S/p reglan  Diarrhea> c.diff neg x2 P:   Monitor drain output Continue diet as ordered Continue h2 blocker oral for SUP Need final recomm from gen surg  HEMATOLOGIC A:   Chronic polycythemia - now anemic P:  Follow H/H Monitor for bleeding  INFECTIOUS A:   Septic shock> worse 4/29 likely all related to draining abscess on 4/26 P:   BCx2  4/26>>bacteroides Sputum 4/26>>nl flora Wound culture 4/26 > bacteroides 4/26 c dif>>neg 5/3 resp cx >> candida Abx:  4/26 vanc>> 4/30 4/26 zoysn>>4/29 4/27 cipro>> 5/1 4/29 meropenem >>  5/9  ENDOCRINE CBG (last 3)   Recent Labs  12/09/14 1644 12/09/14 1911 12/09/14 2335  GLUCAP 84 82 96    A:   No acute issue P:   SSI  NEUROLOGIC A:  Insomnia Severe agitation/ asynchrony > resolved  P:   fent prn  Added trazodone  PT consult -await CIR input, may be a candidate after acute issues are resolved   FAMILY  - Updates: updated wife 5/9  Transfer to  [facility] 5/10  The patient is critically ill with multiple organ systems failure and requires high complexity decision making for assessment and support, frequent evaluation and titration of therapies, application of advanced monitoring technologies and extensive interpretation of multiple databases. Critical Care Time devoted to patient care services described in this note independent of APP time is 31 minutes.    Kara Mead MD. Shade Flood. [place] [facility] Pager [phone] If no response call [phone]    12/11/2014, 5:43 PM

## 2014-12-11 NOTE — Progress Notes (Signed)
eLink Physician-Brief Progress Note Patient Name: Allen Green DOB: 1950-08-21 MRN: [id number]   Date of Service  12/11/2014  HPI/Events of Note  Called d/t Tachycardia with ventricular rate = 150. I can't pull up the bedside monitor from [facility]. In addition, patient has been transferred to [facility] as of today. They have not seen him yet.   eICU Interventions  Will order: 1. 12 Lead EKG.  Will have nurse page [facility] to assume care.      Intervention Category Intermediate Interventions: Arrhythmia - evaluation and management  Susy Placzek Eugene 12/11/2014, 4:36 PM

## 2014-12-11 NOTE — Significant Event (Signed)
Rapid Response Event Note  Overview: Time Called: 7943 Arrival Time: 1652 Event Type: Cardiac  Initial Focused Assessment: Patient with Rapid Atrial Flutter.  HR 150s,  BP 104/77  RR 24  O2 sat 94% on 3l [place] Temp 97.6 Per family patient is more confused this afternoon and his skin color has changed over the past couple hours.  He has a grey hue.  Lung sounds with rhonchi, diminished bases.  Heart tones irregular.  Patient denies SOB or CP or palpitations.  He states that he does not feel well. Updated Dr Oletta Darter on patient status and VS.  Interventions: 150 mg Amiodarone bolus IV over 10 min. HR briefly 110s then 130-160s Raul PA at bedside to assess patient 1800 Patient transferred to 3S04 via bed with O2  And heart monitor. Cardiology at bedside to assess patient.  Plan to start Cardizem gtt BP 99/69  Afl 165,  Md notified by Rn 500cc NS bolus then start cardizem gtt.  RN to call if assistance needed.   Event Summary: Name of Physician Notified: Dr Oletta Darter at 1650    at    Outcome: Transferred (Comment) [phone])  Event End Time: [place]  Raliegh Ip

## 2014-12-11 NOTE — Progress Notes (Signed)
eLink Physician-Brief Progress Note Patient Name: Allen Green DOB: [birth year] MRN: [id number]   Date of Service  12/11/2014  HPI/Events of Note  AFlutter with RVR (Ventricular rate = 150) post HD today. Already on Amiodarone PO.   eICU Interventions  Will order: 1. Transfer to stepdown bed.  2. Amiodarone IV bolus. 3. BMP and Magnesium level now. 4. Troponin I now and Q 6 hours X 2 (total 3 sets).     Intervention Category Major Interventions: Arrhythmia - evaluation and management  Makendra Vigeant Eugene 12/11/2014, 5:25 PM

## 2014-12-11 NOTE — Progress Notes (Signed)
NUTRITION FOLLOW UP  Intervention:   Provide Nepro Shake po BID, each supplement provides 425 kcal and 19 grams protein.  Provide Resource Breeze po once daily, each supplement provides 250 kcal and 9 grams of protein  Encourage adequate PO intake.   Nutrition Dx:   Inadequate oral intake related to inability to eat as evidenced by NPO; advanced to diet po 0%; ongoing  Goal:   Pt to meet >/= 90% of their estimated nutrition needs; not met  Monitor:   PO intake, weight trends, labs, I/O's  Assessment:   64 year old Caucasian man with past medical history significant for COPD, GERD and a recently discharged gallbladder drain with development of an abscess and septic shock/acute respiratory failure who presented to the hospital yesterday. Had a CT scan of his abdomen/pelvis with intravenous contrast yesterday to study his area of dislodged gallbladder drain better prior to undergoing decompression of subcutaneous abscess and new drain placement in gallbladder.  -Drain replacement 4/26. Pt extubated 5/5. Pt currently on IHD.  Pt has been advanced to a dysphagia 3 renal diet. Meal completion has been 0%. Pt reports having a lack of appetite and has not been eating his food at meals. Pt is agreeable on trying Nepro Shake. RD to order. Pt was encouraged to eat his food at meals and to drink his supplements.  Labs: Low CO2, calcium, and GFR. High BUN, creatinine, phosphorous (6.5).  Height: Ht Readings from Last 1 Encounters:  11/27/14 $RemoveB'5\' 11"'[id number]$  (1.803 m)    Weight Status:   Wt Readings from Last 1 Encounters:  12/11/14 254 lb 3.1 oz (115.3 kg)  04/270/16 270 lbs  Body mass index is 35.47 kg/(m^2). Class II obesity  Re-estimated needs:  Kcal: 2300-2500 Protein: 135-155 grams Fluid: 1.2 L/day  Skin: +1 generalized edema  Diet Order: Diet renal with fluid restriction Fluid restriction:: 1200 mL Fluid; Room service appropriate?: Yes; Fluid consistency:: Thin   Intake/Output Summary  (Last 24 hours) at 12/11/14 1335 Last data filed at 12/11/14 1310  Gross per 24 hour  Intake    185 ml  Output   1615 ml  Net  -1430 ml    Last BM: 5/8   Labs:   Recent Labs Lab 12/05/14 0406  12/06/14 0536  12/07/14 0520  12/09/14 0516 12/09/14 0517 12/10/14 0520 12/11/14 0558  NA  --   < >  --   < >  --   < > 138 138 138 137  K  --   < >  --   < >  --   < > 4.5 4.4 3.8 3.7  CL  --   < >  --   < >  --   < > 102 103 102 102  CO2  --   < >  --   < >  --   < > 21* 22 25 21*  BUN  --   < >  --   < >  --   < > 116* 116* 64* 90*  CREATININE  --   < >  --   < >  --   < > 6.39* 6.60* 4.65* 6.70*  CALCIUM  --   < >  --   < >  --   < > 8.2* 8.2* 7.9* 8.1*  MG 2.4  --  2.7*  --  2.8*  --   --   --   --   --   PHOS  --   < >  --   < >  --   < >  7.7*  --  6.0* 6.5*  GLUCOSE  --   < >  --   < >  --   < > 89 89 78 95  < > = values in this interval not displayed.  CBG (last 3)   Recent Labs  12/09/14 1644 12/09/14 1911 12/09/14 2335  GLUCAP 84 82 96    Scheduled Meds: . amiodarone  400 mg Oral Daily  . antiseptic oral rinse  7 mL Mouth Rinse BID  . arformoterol  15 mcg Nebulization BID  . budesonide (PULMICORT) nebulizer solution  0.5 mg Nebulization BID  . darbepoetin (ARANESP) injection - NON-DIALYSIS  100 mcg Subcutaneous Q Mon-1800  . famotidine  20 mg Oral Daily  . furosemide  120 mg Intravenous 3 times per day  . heparin subcutaneous  5,000 Units Subcutaneous 3 times per day  . ipratropium-albuterol  3 mL Nebulization Q6H  . metoprolol tartrate  12.5 mg Oral BID  . sodium chloride  10-40 mL Intracatheter Q12H    Continuous Infusions: . sodium chloride 10 mL/hr at 12/10/14 0800    Kallie Locks, MS, RD, LDN Pager # [phone] After hours/ weekend pager # [phone]

## 2014-12-11 NOTE — Progress Notes (Signed)
[facility] Progress Note   Subjective: Transferred to 6E- hemodynamically stable - BP may be a little low- at least 560 of urine- not all recorded due to transfer- A flutter started on low dose lopressor.  He tells me that he is going home today because he hasnt slept the last 2 nights- bed is uncomfortable    Filed Vitals:   12/10/14 2056 12/10/14 2139 12/11/14 0240 12/11/14 0500  BP: 113/50   89/51  Pulse: 69 76  85  Temp: 97.8 F (36.6 C)   97.6 F (36.4 C)  TempSrc: Oral   Oral  Resp: 18 20  18   Height:      Weight: 118.253 kg (260 lb 11.2 oz)     SpO2: 92%  94% 92%   Exam: Extubated, alert No jvd Chest exp wheezing RRR no rub/ gallop Abd obese, nondistended Mild-moderate diffuse pitting edema Neuro alert, feeling better   UNa 51, UCr 136 UA  gran casts, amber, 100 prot, 1.022, 11-20 wbc, no rbc CXR no chg bibasilar airspace dz  Summary: 64 yo w hx COPD/home O2, obesity admitted in April 2015 with acute cholecystitis, too high risk for surgery d/t pulm disease had perc drain placed. Had been managed since with chronic drain with changes per IR.  On 11/03/14 pt was seen by IR for the tube falling out.  They noted  there was not an indication for PCT replacement. On 4/26 patient was admitted w septic shock and gallbladder abcess. Pt was admitted to ICU. IR placed another perc drain on 4/26.  Patient developed AKI/ ATN from shock/ contrast and was treated with CRRT from 4/27-5/6 at [facility].   Assessment: 1. AKI / ATN - from sepsis/ contrast. Baseline creatinine normal. S/P CRRT 4/27-5/6.    S/P IHD 5/8 with much improvement in numbers.  Making more urine but numbers worse today so will do another IHD treatment today.   But, with normal baseline renal function am optimistic for recovery.  Vascath in since 4/27.   2. Bacteroides sepsis/ gallbladder infection, s/p chole tube - off pressors, on meropenem- per primary- WBC decreasing 3. Volume excess - moderate,  edema on  CXR remains- volume removal will be limited by low BP 4. Shock - resolved 5. Anemia- multifactorial- is decreasing- have added aranesp- not candidate for iron due to previous infection- transfuse PRN  6. A flutter- amiodarone/low dose lopressor- will need coumadin 7. Customer service complaints- have talked to nursing to get him air mattress for bed.  Will increase trazadone      Mansi Tokar A   12/11/2014, 8:02 AM     Recent Labs Lab 12/09/14 0516 12/09/14 0517 12/10/14 0520 12/11/14 0558  NA 138 138 138 137  K 4.5 4.4 3.8 3.7  CL 102 103 102 102  CO2 21* 22 25 21*  GLUCOSE 89 89 78 95  BUN 116* 116* 64* 90*  CREATININE 6.39* 6.60* 4.65* 6.70*  CALCIUM 8.2* 8.2* 7.9* 8.1*  PHOS 7.7*  --  6.0* 6.5*    Recent Labs Lab 12/09/14 0516 12/10/14 0520 12/11/14 0558  ALBUMIN 1.9* 1.8* 1.8*    Recent Labs Lab 12/08/14 0530 12/09/14 0517 12/11/14 0558  WBC 22.3* 18.0* 15.7*  NEUTROABS  --  14.8*  --   HGB 9.7* 8.8* 9.4*  HCT 29.8* 26.4* 28.5*  MCV 91.7 90.4 90.5  PLT 260 341 324   . amiodarone  400 mg Oral Daily  . antiseptic oral rinse  7 mL Mouth Rinse  BID  . arformoterol  15 mcg Nebulization BID  . budesonide (PULMICORT) nebulizer solution  0.5 mg Nebulization BID  . darbepoetin (ARANESP) injection - NON-DIALYSIS  100 mcg Subcutaneous Q Mon-1800  . famotidine  20 mg Oral Daily  . furosemide  120 mg Intravenous 3 times per day  . heparin subcutaneous  5,000 Units Subcutaneous 3 times per day  . ipratropium-albuterol  3 mL Nebulization Q6H  . metoprolol tartrate  12.5 mg Oral BID  . sodium chloride  10-40 mL Intracatheter Q12H   . sodium chloride 10 mL/hr at 12/10/14 0800   sodium chloride, sodium chloride, acetaminophen **OR** acetaminophen, albumin human, docusate, feeding supplement (NEPRO CARB STEADY), fentaNYL (SUBLIMAZE) injection, heparin, heparin, lidocaine (PF), lidocaine-prilocaine, ondansetron **OR** ondansetron (ZOFRAN) IV,  pentafluoroprop-tetrafluoroeth, RESOURCE THICKENUP CLEAR, sodium chloride, traZODone

## 2014-12-12 LAB — RENAL FUNCTION PANEL
Albumin: 1.7 g/dL — ABNORMAL LOW (ref 3.5–5.0)
Anion gap: 9 (ref 5–15)
BUN: 52 mg/dL — ABNORMAL HIGH (ref 6–20)
CO2: 24 mmol/L (ref 22–32)
CREATININE: 5.14 mg/dL — AB (ref 0.61–1.24)
Calcium: 7.5 mg/dL — ABNORMAL LOW (ref 8.9–10.3)
Chloride: 100 mmol/L — ABNORMAL LOW (ref 101–111)
GFR calc non Af Amer: 11 mL/min — ABNORMAL LOW (ref >60)
GFR, EST AFRICAN AMERICAN: 13 mL/min — AB (ref >60)
GLUCOSE: 243 mg/dL — AB (ref 70–99)
Phosphorus: 6.2 mg/dL — ABNORMAL HIGH (ref 2.5–4.6)
Potassium: 3.9 mmol/L (ref 3.5–5.1)
Sodium: 133 mmol/L — ABNORMAL LOW (ref 135–145)

## 2014-12-12 LAB — OCCULT BLOOD X 1 CARD TO LAB, STOOL: FECAL OCCULT BLD: POSITIVE — AB

## 2014-12-12 LAB — TROPONIN I

## 2014-12-12 LAB — CLOSTRIDIUM DIFFICILE BY PCR: Toxigenic C. Difficile by PCR: NEGATIVE

## 2014-12-12 LAB — TSH: TSH: 3.037 u[IU]/mL (ref 0.350–4.500)

## 2014-12-12 MED ORDER — LEVALBUTEROL HCL 0.63 MG/3ML IN NEBU
0.6300 mg | INHALATION_SOLUTION | Freq: Four times a day (QID) | RESPIRATORY_TRACT | Status: DC
  Administered 2014-12-12 – 2014-12-16 (×13): 0.63 mg via RESPIRATORY_TRACT
  Filled 2014-12-12 (×29): qty 3

## 2014-12-12 MED ORDER — IPRATROPIUM BROMIDE 0.02 % IN SOLN
0.5000 mg | Freq: Four times a day (QID) | RESPIRATORY_TRACT | Status: DC
  Administered 2014-12-12 – 2014-12-16 (×13): 0.5 mg via RESPIRATORY_TRACT
  Filled 2014-12-12 (×13): qty 2.5

## 2014-12-12 MED ORDER — NYSTATIN 100000 UNIT/ML MT SUSP
5.0000 mL | Freq: Four times a day (QID) | OROMUCOSAL | Status: DC
  Administered 2014-12-12 – 2014-12-18 (×23): 500000 [IU] via ORAL
  Filled 2014-12-12 (×28): qty 5

## 2014-12-12 NOTE — Progress Notes (Addendum)
I met with pt, his spouse, sister and cousin at bedside. Sister was feeding pt. I discussed the need for pt to do as much for himself as possible. Feeding, sitting up in recliner daily with therapy or nursing assist, etc. Pt's with poor prior level of function needing assist. He has a bad left knee and SOB with activity. Frequent urination. He could walk with and without cane but about 100 feet before becoming tired and SOB. Used O2 at 2 liters nasal cannula but increased O2 with activity. Did Drive. Retired [employer]. Uses [facility] at home for it is too far to walk from den to bathroom frequently. Spouse with him 24/7. I will follow his progress daily but unable to admit with ARF requiring intermittent hemodialysis until renal recovers or pt is declared ESRD and preparing for outpt dialysis. Discussed with RN CM. [phone] Karene Fry, Admissions Coordinator, will follow in my absence Thursday and Friday. She can be reached at [phone]

## 2014-12-12 NOTE — Progress Notes (Signed)
Patient: Allen Green / Admit Date: 11/16/2014 / Date of Encounter: 12/12/2014, 12:35 PM   Subjective: Much quieter day today. No CP or SOB. Denies any acute complaints.   Objective: Telemetry: Atrial flutter with CVR (was tachy yesterday afternoon/evening) Physical Exam: Blood pressure 106/63, pulse 83, temperature 98.9 F (37.2 C), temperature source Oral, resp. rate 20, height 5\' 11"  (1.803 m), weight 254 lb 3.1 oz (115.3 kg), SpO2 96 %. General: Well developed, well nourished, in no acute distress. Head: Normocephalic, atraumatic, sclera non-icteric, no xanthomas, nares are without discharge. Neck: JVP not elevated. Lungs: Diminished throughout with very faint expiratory wheezing on auscultation. No rales or rhonchi. Breathing is unlabored. Heart: Irregular rhythm, controlled rate, distant heart sounds. S1 S2 without murmurs, rubs, or gallops.  Abdomen: Soft, non-tender, non-distended with normoactive bowel sounds. No rebound/guarding. Extremities: No clubbing or cyanosis. No edema. Distal pedal pulses are 2+ and equal bilaterally. Neuro: Alert and oriented X 3. Moves all extremities spontaneously. Psych:  Responds to questions appropriately with a normal affect.   Intake/Output Summary (Last 24 hours) at 12/12/14 1235 Last data filed at 12/12/14 0600  Gross per 24 hour  Intake 395.84 ml  Output   2170 ml  Net -1774.16 ml    Inpatient Medications:  . amiodarone  400 mg Oral Daily  . antiseptic oral rinse  7 mL Mouth Rinse BID  . arformoterol  15 mcg Nebulization BID  . budesonide (PULMICORT) nebulizer solution  0.5 mg Nebulization BID  . darbepoetin (ARANESP) injection - NON-DIALYSIS  100 mcg Subcutaneous Q Mon-1800  . diltiazem  10 mg Intravenous Once  . famotidine  20 mg Oral Daily  . feeding supplement (NEPRO CARB STEADY)  237 mL Oral BID BM  . feeding supplement (RESOURCE BREEZE)  1 Container Oral Q1500  . furosemide  120 mg Intravenous 3 times per day  . heparin  subcutaneous  5,000 Units Subcutaneous 3 times per day  . ipratropium-albuterol  3 mL Nebulization Q6H  . metoprolol tartrate  25 mg Oral BID  . nystatin  5 mL Oral QID  . sodium chloride  10-40 mL Intracatheter Q12H   Infusions:  . sodium chloride 10 mL/hr at 12/10/14 0800  . diltiazem (CARDIZEM) infusion 5 mg/hr (12/12/14 0541)    Labs:  Recent Labs  12/11/14 0558 12/12/14 0525  NA 137 133*  K 3.7 3.9  CL 102 100*  CO2 21* 24  GLUCOSE 95 243*  BUN 90* 52*  CREATININE 6.70* 5.14*  CALCIUM 8.1* 7.5*  PHOS 6.5* 6.2*    Recent Labs  12/11/14 0558 12/12/14 0525  ALBUMIN 1.8* 1.7*    Recent Labs  12/11/14 0558  WBC 15.7*  HGB 9.4*  HCT 28.5*  MCV 90.5  PLT 324    Recent Labs  12/11/14 1940 12/11/14 2330 12/12/14 0525  TROPONINI <0.03 <0.03 <0.03   Invalid input(s): POCBNP No results for input(s): HGBA1C in the last 72 hours.   Radiology/Studies:  Ct Abdomen Pelvis Wo Contrast  11/30/2014   CLINICAL DATA:  Abdominal distention, vomiting.  EXAM: CT ABDOMEN AND PELVIS WITHOUT CONTRAST  TECHNIQUE: Multidetector CT imaging of the abdomen and pelvis was performed following the standard protocol without IV contrast.  COMPARISON:  01/27/2015 contrast study  FINDINGS: Lower chest: Emphysematous changes noted in the lung bases. Trace bilateral pleural effusions, new since prior study. Bibasilar atelectasis. Heart is normal size. Densely calcified coronary arteries.  Hepatobiliary: Cholecystostomy tube has been placed into the gallbladder. Gallbladder is decompressed. Small amount  of perihepatic ascites, new since prior study. No focal hepatic abnormality visualized on this unenhanced study.  Pancreas: No focal abnormality or ductal dilatation.  Spleen: No focal abnormality.  Normal size.  Adrenals/Urinary Tract: No focal renal or adrenal abnormality. No hydronephrosis. Urinary bladder is decompressed with Foley catheter in place.  Stomach/Bowel: NG tube present in the  stomach which is unremarkable. Mildly prominent abdominal small bowel loops within the mid jejunum. Questionable mild wall thickening in these mildly prominent small bowel loops, best seen on images 66-75 of series 2. Transition to normal caliber small bowel noted on image 66. Appendix is visualized and unremarkable.  Vascular/Lymphatic: Aortic and iliac calcifications. No aneurysm. No retroperitoneal or mesenteric adenopathy.  Reproductive: No mass or other significant abnormality.  Other: Trace free fluid in the pelvis and adjacent to the liver.  Musculoskeletal: No focal bone lesion or acute bony abnormality.  IMPRESSION: Mildly prominent central small bowel loops in the lower abdomen and upper pelvis, new since prior study. There is questionable wall thickening within these loops suggesting the possibility of enteritis. This alternatively could represent early partial small bowel obstruction, but no visible abnormality at the transition point.  Interval placement of cholecystostomy tube with decompressed gallbladder.  New trace bilateral pleural effusions and trace free fluid in the abdomen and pelvis.  Coronary artery disease, COPD.   Electronically Signed   By: Rolm Baptise M.D.   On: 11/30/2014 12:48   Dg Chest 2 View  11/19/2014   CLINICAL DATA:  Abdominal pain, COPD, difficulty breathing.  EXAM: CHEST  2 VIEW  COMPARISON:  11/11/2014 CT  FINDINGS: COPD. Bibasilar opacities, increased on the left. Mild pulmonary arterial prominence. Calcified right hilar nodes. No overt effusion. No pneumothorax. Osteopenia and multilevel degenerative change.  IMPRESSION: COPD. Pulmonary arterial prominence may reflect pulmonary arterial hypertension.  Increased left lung base opacity may reflect atelectasis or developing pneumonia.   Electronically Signed   By: Carlos Levering M.D.   On: 11/19/2014 04:55   US Abdomen Complete  11/19/2014   CLINICAL DATA:  Right upper quadrant pain.  EXAM: ULTRASOUND ABDOMEN COMPLETE   COMPARISON:  None.  FINDINGS: Gallbladder: Gallstones are present. Gallbladder wall is upper normal to mildly thickened at 3 mm. Negative sonographic Murphy sign.  Common bile duct: Diameter: Dilated at 8 mm proximally. The mid/distal duct is obscured by bowel gas artifact.  Liver: Increased/ heterogeneous in echogenicity. Focal lesion detection is limited in this setting.  IVC: No abnormality visualized.  Pancreas: Poorly visualized/nondiagnostic.  Spleen: The spleen has a normal sonographic appearance passed, measuring 10.7 cm.  Right Kidney: Length: Our 12.7 cm. Echogenicity within normal limits. No mass or hydronephrosis visualized.  Left Kidney: Length: 12.6 cm. Echogenicity within normal limits. No mass or hydronephrosis visualized.  Abdominal aorta: No aneurysm visualized.  Other findings: None.  IMPRESSION: Cholelithiasis and mild gallbladder wall thickening. No sonographic Murphy sign to favor acute cholecystitis. Correlate with HIDA scan if clinical concern persists.  Mild CBD prominence at 8 mm proximally. The mid/ distal duct is obscured by bowel gas artifact. Correlate with LFTs and ERCP or MRCP if warranted.  Pancreas obscured.  Hepatic steatosis.   Electronically Signed   By: Carlos Levering M.D.   On: 11/19/2014 05:14   Ct Abdomen Pelvis W Contrast  11/27/2014   CLINICAL DATA:  64 year old male with history of cholecystitis status post placement of percutaneous cholecystostomy tube. The tube was subsequently displaced. Patient having recurrent pain at the tube insertion site.  EXAM:  CT ABDOMEN AND PELVIS WITH CONTRAST  TECHNIQUE: Multidetector CT imaging of the abdomen and pelvis was performed using the standard protocol following bolus administration of intravenous contrast.  CONTRAST:  19mL OMNIPAQUE IOHEXOL 300 MG/ML SOLN, 130mL OMNIPAQUE IOHEXOL 300 MG/ML SOLN  COMPARISON:  Most recent prior CT abdomen/ pelvis 11/19/2014  FINDINGS: Lower Chest: Centrilobular emphysema. Mild lower lobe  atelectasis. Heart is within normal limits for size. No pericardial effusion. Small hiatal hernia.  Abdomen: Unremarkable CT appearance of the stomach, duodenum, spleen, adrenal glands and pancreas. Normal hepatic contour and morphology. Mild gallbladder distention. There is slight hyper enhancement and thickening of the gallbladder wall with mild inflammatory stranding in the pericholecystic fat. Additionally, there is increased prominence of the soft tissue tract at the site of the prior gallbladder drain. There is small volume fluid tracking along the tracks with increased soft tissue thickening and inflammatory stranding.  Unremarkable appearance of the bilateral kidneys. No focal solid lesion, hydronephrosis or nephrolithiasis. 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 No evidence of obstruction or focal bowel wall thickening. Normal appendix in the right lower quadrant. The terminal ileum is unremarkable.  Pelvis: Unremarkable bladder, prostate gland and seminal vesicles. No free fluid or suspicious adenopathy.  Bones/Soft Tissues: No acute fracture or aggressive appearing lytic or blastic osseous lesion. Chronic bilateral L5 pars defects. No associated anterolisthesis.  Vascular: Atherosclerotic vascular disease without significant stenosis or aneurysmal dilatation.  IMPRESSION: 1. Developing recurrent gallbladder distention, wall thickening and hyper enhancement concerning for recurrent acute cholecystitis. 2. There is also progressive inflammatory stranding along the tract from the prior percutaneous cholecystostomy tube. The tract contains a small volume of fluid and communicates directly with the gallbladder lumen via a short transhepatic course. This is concerning for bile leaking into the tract. Query clinical history of bile leakage from the skin insertion site? Alternately, this could represent an abscess developing within the prior tube tract with secondary inflammatory  stranding of the gallbladder. This is considered less likely. Nuclear medicine HIDA scan could further evaluate to confirm recurrent obstruction of the cystic duct.   Electronically Signed   By: Jacqulynn Cadet M.D.   On: 11/27/2014 01:05   Ct Abdomen Pelvis W Contrast  11/19/2014   CLINICAL DATA:  Upper abdominal pain.  Nausea and vomiting.  EXAM: CT ABDOMEN AND PELVIS WITH CONTRAST  TECHNIQUE: Multidetector CT imaging of the abdomen and pelvis was performed using the standard protocol following bolus administration of intravenous contrast.  CONTRAST:  131mL OMNIPAQUE IOHEXOL 300 MG/ML  SOLN  COMPARISON:  CT scan dated 11/11/2014  FINDINGS: Tiny hiatal hernia. Cholecystostomy tube has been removed. Slight thickening of the gallbladder wall which is felt to be chronic. Biliary tree is otherwise normal. Liver, spleen, pancreas, adrenal glands, and kidneys are normal. The bowel appears normal except for a few diverticula in the descending colon. Bladder and prostate gland appear normal.  Extensive atheromatous disease in the abdominal aorta.  No adenopathy. No acute osseous abnormality. Bilateral pars defects at L5 with grade 1 spondylolisthesis.  IMPRESSION: No acute abnormalities. Cholecystostomy tube has been removed. Slight chronic thickening of the gallbladder wall.   Electronically Signed   By: Lorriane Shire M.D.   On: 11/19/2014 07:21   US Renal  11/28/2014   CLINICAL DATA:  64 year old male with renal failure  EXAM: RENAL / URINARY TRACT ULTRASOUND COMPLETE  COMPARISON:  Prior CT abdomen/pelvis 11/27/2014  FINDINGS: Right Kidney:  Length: 11.7 cm. Echogenicity within normal limits. No mass or  hydronephrosis visualized.  Left Kidney:  Length: 14 cm. Echogenicity within normal limits. No mass or hydronephrosis visualized.  Bladder:  Appears normal for degree of bladder distention.  Other: Incidental note is made of small volume perihepatic ascites. The liver is diffusely heterogeneous in appearance.   IMPRESSION: 1. No evidence of hydronephrosis or other renal abnormality. 2. Incidental note is made of small volume perihepatic ascites.   Electronically Signed   By: Jacqulynn Cadet M.D.   On: 11/28/2014 21:05   Ir Perc Cholecystostomy  11/29/2014   ADDENDUM REPORT: 11/29/2014 16:16  ADDENDUM: Correction to the procedure title: PLACEMENT OF PERCUTANEOUS CHOLECYSTOSTOMY TUBE.   Electronically Signed   By: Markus Daft M.D.   On: 11/29/2014 16:16   11/29/2014   CLINICAL DATA:  64 year old with sepsis and cholecystitis. History of prior cholecystostomy tube.  EXAM: REPLACEMENT OF CHOLECYSTOSTOMY TUBE THROUGH EXISTING TRACT WITH ULTRASOUND AND FLUOROSCOPIC GUIDANCE  Physician: Stephan Minister. Henn, MD  FLUOROSCOPY TIME:  42 seconds, 48 mGy.  CONTRAST:  5 mL Omnipaque 300  MEDICATIONS: None  ANESTHESIA/SEDATION: Moderate sedation time: None  PROCEDURE: Informed consent was obtained from the patient's wife. Patient was placed supine on the interventional table. The right upper abdomen was evaluated with ultrasound. The gallbladder was small with a small amount of fluid. A hypoechoic tract was identified from the skin to gallbladder. In addition, the skin just above this tract was red and raised. The right abdomen was prepped and draped in sterile fashion. Maximal barrier sterile technique was utilized including caps, mask, sterile gowns, sterile gloves, sterile drape, hand hygiene and skin antiseptic. The area of redness was anesthetized with 1% lidocaine. A small incision was made and a 5 French catheter was advanced down the tract with ultrasound guidance. A large amount of bloody purulent fluid started to drained around the catheter. The area of skin fullness decompressed following this drainage. Ultrasound demonstrated that the 5 French drain was actually in the gallbladder. Small amount of contrast under fluoroscopy confirmed placement in the gallbladder. A J wire was placed and a 10 [place] multi-purpose drain was  advanced over the wire. Contrast injection again confirmed placement in the gallbladder. 30 mL of thick purulent green fluid was aspirated. Sample sent for culture. Catheter was sutured to the skin and attached to gravity bag. Bandage was placed.  FINDINGS: The old drain site was healed but this area was erythematous and raised. Ultrasound demonstrated a hypoechoic tract between the old skin site and the gallbladder. After making an incision at the old drain site, purulent fluid immediately drained to the skin. A 5 French catheter easily advanced from the skin site to the gallbladder. 10 French drain was confirmed within the gallbladder. 30 mL of purulent fluid was aspirated.  Estimated blood loss: Minimal  COMPLICATIONS: None  IMPRESSION: Successful replacement of the percutaneous cholecystostomy tube through the old drain tract.  Subcutaneous abscess along the old drain tract which was successfully decompressed during this procedure.  Electronically Signed: By: Markus Daft M.D. On: 11/27/2014 17:30   Dg Chest Port 1 View  12/07/2014   CLINICAL DATA:  Respiratory failure  EXAM: PORTABLE CHEST - 1 VIEW  COMPARISON:  12/06/2014  FINDINGS: There is removal of the endotracheal tube and nasogastric tube. Bilateral jugular central lines persists, reaching the SVC. There is no pneumothorax. There is persistent vascular and interstitial congestion without significant interval change. There are mild basilar opacities, unchanged.  IMPRESSION: Removal of ET and NG tubes. No other significant interval change,  with persistent vascular/interstitial congestion and persistent mild basilar airspace opacities.   Electronically Signed   By: Andreas Newport M.D.   On: 12/07/2014 06:03   Dg Chest Port 1 View  12/06/2014   CLINICAL DATA:  Respiratory failure  EXAM: PORTABLE CHEST - 1 VIEW  COMPARISON:  12/05/2014  FINDINGS: The endotracheal tube is 5.3 cm above the carina. There are bilateral jugular central lines extending into  the SVC. There is a nasogastric tube extending below the diaphragm and off the inferior edge of the image. There is no interval change in bibasilar airspace opacities. There is continued vascular and interstitial congestive change, without significant difference.  IMPRESSION: Support equipment appears satisfactorily positioned.  No interval difference in the moderate vascular/interstitial congestive changes or the bibasilar airspace opacities.   Electronically Signed   By: Andreas Newport M.D.   On: 12/06/2014 05:35   Dg Chest Port 1 View  12/05/2014   CLINICAL DATA:  Respiratory failure  EXAM: PORTABLE CHEST - 1 VIEW  COMPARISON:  12/04/2014  FINDINGS: The endotracheal tube is 6.2 cm above the carina. The left jugular central line extends into the SVC. There is a right jugular central line which reaches the upper SVC. There are basilar opacities bilaterally which have not changed significantly. There is no pneumothorax.  IMPRESSION: Support equipment appears satisfactorily positioned.  No significant interval change in the bilateral airspace opacities.   Electronically Signed   By: Andreas Newport M.D.   On: 12/05/2014 05:33   Dg Chest Port 1 View  12/04/2014   CLINICAL DATA:  Respiratory failure  EXAM: PORTABLE CHEST - 1 VIEW  COMPARISON:  12/03/2014  FINDINGS: The endotracheal tube tip is 7 cm above the carina. There is a right jugular central line with tip in the high SVC. There is a left jugular central line with tip in the SVC. Central and basilar airspace opacities persist without significant interval change. No pneumothorax is evident.  IMPRESSION: Support equipment appears satisfactorily positioned.  No significant interval change in the bilateral airspace opacities.   Electronically Signed   By: Andreas Newport M.D.   On: 12/04/2014 06:35   Dg Chest Port 1 View  12/03/2014   CLINICAL DATA:  Acute respiratory failure  EXAM: PORTABLE CHEST - 1 VIEW  COMPARISON:  12/02/2014  FINDINGS: The  endotracheal tube extends to just below the clavicular heads. The left jugular central line extends into the upper SVC. The right jugular line extends into the upper SVC. The nasogastric tube extends into the stomach and off the inferior edge of the image.  Airspace opacities persist in both bases without significant interval change.  IMPRESSION: Support equipment appears satisfactorily positioned.  No significant interval change in the bilateral airspace opacities.   Electronically Signed   By: Andreas Newport M.D.   On: 12/03/2014 06:02   Dg Chest Port 1 View  12/02/2014   CLINICAL DATA:  Acute onset of respiratory failure. Initial encounter.  EXAM: PORTABLE CHEST - 1 VIEW  COMPARISON:  Chest radiograph performed 11/30/2014  FINDINGS: The patient's endotracheal tube is seen ending just above the carina. This could be retracted 2 cm.  A right-sided dual-lumen catheter is noted ending about the mid SVC. A left IJ line is also noted ending about the mid SVC. An enteric tube is noted extending below the diaphragm.  Vascular congestion is noted. Bibasilar airspace opacities raise concern for mild interstitial edema. Pneumonia might have a similar appearance. No definite pleural effusion or pneumothorax is  seen.  The cardiomediastinal silhouette is normal in size. No acute osseous abnormalities are identified.  IMPRESSION: 1. Endotracheal tube seen ending just above the carina. This could be retracted 2 cm. 2. Vascular congestion noted. Bibasilar airspace opacities raise concern for mild interstitial edema. Pneumonia might have a similar appearance.  These results were called by telephone at the time of interpretation on 12/02/2014 at 6:39 am to [place] at the [facility], who verbally acknowledged these results.   Electronically Signed   By: Garald Balding M.D.   On: 12/02/2014 06:40   Dg Chest Port 1 View  11/30/2014   CLINICAL DATA:  Shortness of breath.  EXAM: PORTABLE CHEST - 1 VIEW  COMPARISON:   Same day.  FINDINGS: Stable cardiomediastinal silhouette endotracheal nasogastric tubes are unchanged. Right internal jugular catheter line is unchanged. Stable bibasilar opacities are noted concerning for subsegmental atelectasis, pneumonia or edema. No pneumothorax or significant pleural effusion is noted.  IMPRESSION: Stable bibasilar opacities are noted concerning for subsegmental atelectasis, pneumonia or edema. Stable support apparatus.   Electronically Signed   By: Marijo Conception, M.D.   On: 11/30/2014 09:40   Dg Chest Port 1 View  11/30/2014   CLINICAL DATA:  Respiratory failure.  EXAM: PORTABLE CHEST - 1 VIEW  COMPARISON:  11/29/2014 .  FINDINGS: Endotracheal tube, NG tube, bilateral IJ lines in stable position. Mediastinum and hilar structures are stable. Persistent bibasilar atelectasis and/or infiltrates. No change. No prominent pleural effusion. No pneumothorax.  IMPRESSION: 1. Lines and tubes in stable position. 2. Persistent bibasilar subsegmental atelectasis and/or infiltrates.   Electronically Signed   By: Marcello Moores  Register   On: 11/30/2014 07:04   Dg Chest Port 1 View  11/29/2014   CLINICAL DATA:  Acute respiratory failure.  Pneumonia.  EXAM: PORTABLE CHEST - 1 VIEW  COMPARISON:  11/28/2014  FINDINGS: Support apparatus: Unchanged, consisting of RIGHT IJ vas cath, endotracheal tube, LEFT IJ central line and enteric tube. Monitoring leads project over the chest.  Cardiomediastinal Silhouette:  Unchanged.  Lungs: Left-greater-than-right interstitial and basilar airspace opacity. Superimposed atelectasis. Findings are more suggestive a pulmonary edema than pneumonia. No pneumothorax. When compared to yesterday's exam, aeration at the lung bases is slightly worse, with increasing atelectasis.  Effusions:  None.  Other:  None.  IMPRESSION: 1. Stable support apparatus. 2. Increasing basilar opacity likely due to atelectasis.   Electronically Signed   By: Dereck Ligas M.D.   On: 11/29/2014 07:31     Dg Chest Port 1 View  11/28/2014   CLINICAL DATA:  Acute respiratory failure.  EXAM: PORTABLE CHEST - 1 VIEW  COMPARISON:  11/28/2014 at 0458 hours  FINDINGS: Endotracheal tube remains in place and as well above the carina. Right jugular central venous catheter has been placed and terminates over the upper SVC. Left jugular catheter remains in place, terminating over the upper SVC. Enteric tube courses into the left upper abdomen with tip not imaged. Pulmonary vascular congestion bilateral interstitial opacities overall appear slightly worse compared to the prior study. No pleural effusion or pneumothorax is identified. Cardiomediastinal silhouette is unchanged.  IMPRESSION: 1. New right jugular catheter terminates over the upper SVC. 2. Slight worsening of interstitial edema.   Electronically Signed   By: Logan Bores   On: 11/28/2014 16:20   Dg Chest Port 1 View  11/28/2014   CLINICAL DATA:  Respiratory failure, COPD, septic shock.  EXAM: PORTABLE CHEST - 1 VIEW  COMPARISON:  Portable chest x-ray of  November 27, 2014  FINDINGS: The lungs are adequately inflated. The interstitial markings are coarse. There is no alveolar infiltrate. There is no pleural effusion. The cardiac silhouette is mildly enlarged. The pulmonary vascularity is mildly prominent centrally. The endotracheal tube tip projects 4.4 cm above the carina. The esophagogastric tube tip projects below the inferior margin of the image. The left internal jugular venous catheter tip projects over the proximal SVC.  IMPRESSION: Slight interval increase in pulmonary interstitial edema. There is no alveolar pneumonia nor significant pleural effusion. The support tubes are in appropriate position.   Electronically Signed   By: David  Martinique M.D.   On: 11/28/2014 07:31   Dg Chest Port 1 View  11/27/2014   CLINICAL DATA:  Respiratory failure.  EXAM: PORTABLE CHEST - 1 VIEW  COMPARISON:  One-view chest x-ray from the same day at 4:52 a.m.  FINDINGS: The  patient is now intubated. The endotracheal tube terminates 6 cm above the carina. A left IJ line has been placed. The tip is in the mid SVC. There is no pneumothorax. An NG tube courses off the inferior border of the film.  The heart size is normal. Mild pulmonary vascular congestion is stable. Atherosclerotic calcifications are again noted within the aortic arch. The lungs are otherwise clear.  IMPRESSION: 1. Interval intubation. Satisfactory positioning of the endotracheal tube. 2. Satisfactory positioning of a new left IJ line without radiographic evidence for complication. 3. Atherosclerosis. 4. Stable pulmonary vascular congestion.   Electronically Signed   By: San Morelle M.D.   On: 11/27/2014 09:31   Dg Chest Port 1 View  11/27/2014   CLINICAL DATA:  Acute onset of shortness of breath. Initial encounter.  EXAM: PORTABLE CHEST - 1 VIEW  COMPARISON:  Chest radiograph performed 11/05/2014  FINDINGS: The lungs are well-aerated. Vascular congestion is noted, with mildly increased interstitial markings, possibly reflecting mild interstitial edema. As noted on prior CT, pneumonia could have a similar appearance. No pleural effusion or pneumothorax is seen.  The cardiomediastinal silhouette is within normal limits. No acute osseous abnormalities are seen.  IMPRESSION: Vascular congestion, with mildly increased interstitial markings, possibly reflecting mild interstitial edema. As noted on prior CT, pneumonia could have a similar appearance.   Electronically Signed   By: Garald Balding M.D.   On: 11/27/2014 06:01   Dg Chest Port 1 View  11/20/2014   CLINICAL DATA:  Patient is white states he had pneumonia with percutaneous drain that fell out. Drain was on right side of abdomen. Shortness of breath.  EXAM: PORTABLE CHEST - 1 VIEW  COMPARISON:  11/19/2014  FINDINGS: Patient slightly rotated to the right. Lungs are adequately inflated with mild prominence of the perihilar markings suggesting mild vascular  congestion. No focal lobar consolidation or effusion. Cardiomediastinal silhouette and remainder of the exam is unchanged.  IMPRESSION: Findings suggesting mild vascular congestion.   Electronically Signed   By: Marin Olp M.D.   On: 11/20/2014 22:05   Ir Radiologist Eval & Mgmt  11/13/2014   CONSULTATION: See Progress Note in Epic.   Electronically Signed   By: Aletta Edouard M.D.   On: 11/13/2014 11:50     Assessment and Plan  40M with COPD, tobacco abuse, Chronic pain, GERD, polycythemia, hepatitis B, poor quality echo 11/2014, nonischemic nuc 2012 who presented 11/03/2014 with septic shock and acute respiratory failure secondary to cholecystitis and abscess from a dislodged gallbladder drain.Also developed AKI requiring CRRT. GB drain placed by IR 4/26. Noted to have  new onset atrial flutter this admission.  1. Atrial flutter with RVR 2. Septic shock with bacteroides related to gallbladder disease 3. Acute renal failure due to AKI/ATN, requiring HD this admission 4. Chronic polycythemia, now anemic 5. Profound deconditioning 6. Hypoalbuminemia 1.7  Atrial flutter likely driven by underlying clinical picture. Started on IV amio -> now on 400mg  daily. Required additional IV amio/IV diltiazem last night. Follow HR to determine when we can drop amio to 200mg . Check TSH. Still waiting for echo. Consider changing metoprolol to Bystolic given COPD/wheezing. May also be able to change IV diltiazem to oral soon - but want to see what EF is in case we have to shift to a BB-only strategy. CHADSVASC = 2. He is currently on DVT ppx heparin only - when OK with primary team, would start Coumadin. Note his also anemic (14->9 in the setting of critical illness). Denies bleeding. Will place order for hemoccult. Not a candidate for NOACs acutely with fluctuating renal function. May try to convert him back to sinus rhythm, once he is improved and if he completes 4 weeks of anticoagulation.  Signed, Melina Copa  PA-C Pager: [phone]   I have examined the patient and reviewed assessment and plan and discussed with patient.  Agree with above as stated.  HR now controlled. Patient appears more stable.  COumadin when stable from a hematologic standpoint.   Davelle Anselmi S.

## 2014-12-12 NOTE — Progress Notes (Signed)
Asked by family to assess him again today. He developed A. fib RVR and required transfer to stepdown unit Denies dyspnea or chest pain Chest clear, S1 and S2 irregular on Cardizem drip No edema Oral thrush +  Assessment/plan-  Oral thrush-start nystatin mouthwash, off antibiotics now A. fib RVR-per cardiology, on amiodarone and Cardizem AKI- still hoping for renal recovery  Here, may need permacath is more dialysis required, making more urine Profound deconditioning- accepted to CIR, family has two CNA's so they're hopeful to take him home   Have asked [facility] to take over care  Rigoberto Noel. MD Kara Mead MD. [facility] Pager [phone] If no response call [phone]

## 2014-12-12 NOTE — Progress Notes (Signed)
Medicare Important Message given? YES (If response is "NO", the following Medicare IM given date fields will be blank) Date Medicare IM given:12/12/14 Medicare IM given by: Gagan Dillion 

## 2014-12-12 NOTE — Progress Notes (Signed)
[place] TEAM 1 - Stepdown/ICU TEAM Progress Note  AMROM ORE PJS:[id number] DOB: February 27, 1951 DOA: 11/29/2014 PCP: Linton Rump, PA  Admit HPI / Brief Narrative: 64 y/o male admitted on 4/26 with septic shock and acute respiratory failure due to cholecystitis and an abscess associated with a recently dislodged gallbladder drain.  Course complicated by bibasilar HCAP with severe hypoxemia and severe autoPEEP.  Significant Events: 4/26 admitted - intubated, gallbladder drain placed by IR 4/29 multiple amio boluses for RVR 4/30 worsening hypoxia 5/3 improving pressor needs 5/4 SBTs 5/5 extubated 5/6 off CVVHD 5/8 transfer to [facility] for intermittent HD 5/11 [facility] assumes care  HPI/Subjective: The patient has no complaints today.  His wife reports that his appetite is poor.  He states he is moving his bowels and having somewhat loose stools.  He denies abdominal pain chest pain shortness of breath fevers chills nausea or vomiting.  Assessment/Plan:  Acute respiratory failure with hypercapnea Resolved  Septic shock due to Bacteroides Resolved - due to gallbladder infection  Acute Cholecystis with peri-gallbladder abscess due to dislodged percutaneous drain  s/p drain 4/26 per IR - prior drain 4/3 > 4/12  Acute kidney failure requiring dialysis Due to sepsis and contrast exposure - nephrology following - urine output picking up - nephrology deciding whether to place a semipermanent dialysis catheter therefore will hold Coumadin therapy until this decision is made  Atrial flutter w/ RVR CHADSVASC = 2 - care per Cardiology - rate presently controlled - initiate Coumadin once permanent dialysis catheter placed or decision made not to place one  Severe COPD Tobacco abuse  Counseled on absolute need to discontinue tobacco abuse permanently  Ileus Clinically resolved  BLL HCAP Clinically resolved  Chronic polycythemia due to COPD/hypoxia - now anemic Due to critical illness and acute  kidney failure - no IV iron due to bacteremia - erythropoietin per nephrology  Thrush Treatment initiated  Profound deconditioned state For eventual CIR placement  Hepatitis B core antibody positive This isolated positive result is unclear and could represent a resolved infection, false positive test, or even a chronic low-grade infection - will need to be followed as outpatient - follow-up LFTs in a.m.  Code Status: FULL Family Communication: Spoke with wife and sister at bedside Disposition Plan: SDU  Consultants: [facility] Cardiology Nephrology Pulmonary  Procedures: 4/26 TTE > poor study, can't comment on LV or RV size/function  Antibiotics: None  DVT prophylaxis: Subcutaneous heparin  Objective: Blood pressure 106/63, pulse 83, temperature 98.9 F (37.2 C), temperature source Oral, resp. rate 20, height 5\' 11"  (1.803 m), weight 115.3 kg (254 lb 3.1 oz), SpO2 96 %.  Intake/Output Summary (Last 24 hours) at 12/12/14 1311 Last data filed at 12/12/14 0600  Gross per 24 hour  Intake 395.84 ml  Output    870 ml  Net -474.16 ml   Exam: General: No acute respiratory distress Lungs: Clear to auscultation bilaterally without wheezes or crackles Cardiovascular: Regular rate without murmur gallop or rub  Abdomen: Nontender, nondistended, soft, bowel sounds positive, no rebound, no ascites, no appreciable mass Extremities: No significant cyanosis, clubbing, or edema bilateral lower extremities  Data Reviewed: Basic Metabolic Panel:  Recent Labs Lab 12/06/14 0536  12/07/14 0520 12/08/14 0840 12/09/14 0516 12/09/14 0517 12/10/14 0520 12/11/14 0558 12/12/14 0525  NA  --   < >  --  134* 138 138 138 137 133*  K  --   < >  --  4.1 4.5 4.4 3.8 3.7 3.9  CL  --   < >  --  105 102 103 102 102 100*  CO2  --   < >  --  24 21* 22 25 21* 24  GLUCOSE  --   < >  --  106* 89 89 78 95 243*  BUN  --   < >  --  90* 116* 116* 64* 90* 52*  CREATININE  --   < >  --  4.55* 6.39* 6.60*  4.65* 6.70* 5.14*  CALCIUM  --   < >  --  7.9* 8.2* 8.2* 7.9* 8.1* 7.5*  MG 2.7*  --  2.8*  --   --   --   --   --   --   PHOS  --   < >  --  5.9* 7.7*  --  6.0* 6.5* 6.2*  < > = values in this interval not displayed.  CBC:  Recent Labs Lab 12/06/14 0536 12/07/14 0520 12/08/14 0530 12/09/14 0517 12/11/14 0558  WBC 19.5* 20.4* 22.3* 18.0* 15.7*  NEUTROABS  --   --   --  14.8*  --   HGB 9.2* 9.3* 9.7* 8.8* 9.4*  HCT 28.4* 27.3* 29.8* 26.4* 28.5*  MCV 90.7 89.8 91.7 90.4 90.5  PLT 203 237 260 341 324    Liver Function Tests:  Recent Labs Lab 12/08/14 0840 12/09/14 0516 12/10/14 0520 12/11/14 0558 12/12/14 0525  ALBUMIN 2.1* 1.9* 1.8* 1.8* 1.7*    Coags: No results for input(s): INR in the last 168 hours.  Invalid input(s): PT  Recent Labs Lab 12/07/14 0045 12/07/14 0355 12/07/14 0755 12/07/14 1155 12/07/14 2100  APTT 59* 59* 84* 63* 33    Cardiac Enzymes:  Recent Labs Lab 12/11/14 1940 12/11/14 2330 12/12/14 0525  TROPONINI <0.03 <0.03 <0.03    CBG:  Recent Labs Lab 12/09/14 0822 12/09/14 1245 12/09/14 1644 12/09/14 1911 12/09/14 2335  GLUCAP 93 109* 84 82 96    Recent Results (from the past 240 hour(s))  Culture, respiratory (NON-Expectorated)     Status: None   Collection Time: 12/04/14  9:27 AM  Result Value Ref Range Status   Specimen Description TRACHEAL ASPIRATE  Final   Special Requests NONE  Final   Gram Stain   Final    ABUNDANT WBC PRESENT, PREDOMINANTLY PMN NO SQUAMOUS EPITHELIAL CELLS SEEN NO ORGANISMS SEEN RARE YEAST Performed at [facility]    Culture   Final    FEW CANDIDA ALBICANS Performed at [facility]    Report Status 12/06/2014 FINAL  Final  Clostridium Difficile by PCR     Status: None   Collection Time: 12/08/14  5:15 AM  Result Value Ref Range Status   C difficile by pcr NEGATIVE NEGATIVE Final  Clostridium Difficile by PCR     Status: None   Collection Time: 12/12/14 12:35 AM    Result Value Ref Range Status   C difficile by pcr NEGATIVE NEGATIVE Final     Studies:   Recent x-ray studies have been reviewed in detail by the Attending Physician  Scheduled Meds:  Scheduled Meds: . amiodarone  400 mg Oral Daily  . antiseptic oral rinse  7 mL Mouth Rinse BID  . arformoterol  15 mcg Nebulization BID  . budesonide (PULMICORT) nebulizer solution  0.5 mg Nebulization BID  . darbepoetin (ARANESP) injection - NON-DIALYSIS  100 mcg Subcutaneous Q Mon-1800  . diltiazem  10 mg Intravenous Once  . famotidine  20 mg Oral Daily  . feeding supplement (NEPRO CARB STEADY)  237 mL Oral  BID BM  . feeding supplement (RESOURCE BREEZE)  1 Container Oral Q1500  . furosemide  120 mg Intravenous 3 times per day  . heparin subcutaneous  5,000 Units Subcutaneous 3 times per day  . ipratropium-albuterol  3 mL Nebulization Q6H  . metoprolol tartrate  25 mg Oral BID  . nystatin  5 mL Oral QID  . sodium chloride  10-40 mL Intracatheter Q12H    Time spent on care of this patient: 35 mins   Taygen Acklin T , MD   [facility] Office  [phone] Pager - Text Page per Shea Evans as per below:  On-Call/Text Page:      [email or web address]      password [email or web address]  If 7PM-7AM, please contact night-coverage [email or web address] Password [email or web address] 12/12/2014, 1:11 PM   LOS: 15 days

## 2014-12-12 NOTE — Progress Notes (Signed)
[facility] Progress Note   Subjective: Events noted- had HD yest with 1300 removed, then went into Aflutter- moved back to 3 S- now on cardizem and amio- HR down- feels better because selpt last night- BP has remained lowish- 800 of UOP     Filed Vitals:   12/12/14 0516 12/12/14 0530 12/12/14 0700 12/12/14 0756  BP:  103/53 106/63   Pulse:  82 83   Temp: 98 F (36.7 C)     TempSrc: Oral     Resp:  19 20   Height:      Weight:      SpO2:  90% 97% 96%   Exam: Alert- pleasant No jvd Chest exp wheezing RRR no rub/ gallop Abd obese, nondistended Mild-moderate diffuse pitting edema Neuro alert, feeling better   UNa 51, UCr 136 UA  gran casts, amber, 100 prot, 1.022, 11-20 wbc, no rbc CXR no chg bibasilar airspace dz  Summary: 64 yo w hx COPD/home O2, obesity admitted in April 2015 with acute cholecystitis, too high risk for surgery d/t pulm disease had perc drain placed. Had been managed since with chronic drain with changes per IR.  On 11/03/14 pt was seen by IR for the tube falling out.  They noted  there was not an indication for PCT replacement. On 4/26 patient was admitted w septic shock and gallbladder abcess. Pt was admitted to ICU. IR placed another perc drain on 4/26.  Patient developed AKI/ ATN from shock/ contrast and was treated with CRRT from 4/27-5/6 at [facility].   Assessment: 1. AKI / ATN - from sepsis/ contrast. Baseline creatinine normal. S/P CRRT 4/27-5/6.    S/P IHD 5/8 and 5/10  with much improvement in numbers.  Making more urine but not yet clearing.   But, with normal baseline renal function am optimistic for recovery.  Vascath in since 4/27 so approaching expiration and need for PC if not yet recovered.  Will check labs in AM to assess for HD need  2. Bacteroides sepsis/ gallbladder infection, s/p chole tube - off pressors, on meropenem- per primary- WBC decreasing 3. Volume excess - moderate,  edema on CXR remains- volume removal will be limited by  low BP- took off 1300 but gave at least 500 back 4. Shock - resolved 5. Anemia- multifactorial- is decreasing- have added aranesp- not candidate for iron due to previous infection- transfuse PRN  6. Green flutter- amiodarone/low dose lopressor- now cardizem as well-  will need coumadin eventually- need to wait until find out if he is going to need Green PC  7. Customer service complaints- have talked to nursing to get him air mattress for bed.  Have increased trazadone- slept so is feeling better      Allen Green   12/12/2014, 8:14 AM     Recent Labs Lab 12/10/14 0520 12/11/14 0558 12/12/14 0525  NA 138 137 133*  K 3.8 3.7 3.9  CL 102 102 100*  CO2 25 21* 24  GLUCOSE 78 95 243*  BUN 64* 90* 52*  CREATININE 4.65* 6.70* 5.14*  CALCIUM 7.9* 8.1* 7.5*  PHOS 6.0* 6.5* 6.2*    Recent Labs Lab 12/10/14 0520 12/11/14 0558 12/12/14 0525  ALBUMIN 1.8* 1.8* 1.7*    Recent Labs Lab 12/08/14 0530 12/09/14 0517 12/11/14 0558  WBC 22.3* 18.0* 15.7*  NEUTROABS  --  14.8*  --   HGB 9.7* 8.8* 9.4*  HCT 29.8* 26.4* 28.5*  MCV 91.7 90.4 90.5  PLT 260 341 324   .  amiodarone  400 mg Oral Daily  . antiseptic oral rinse  7 mL Mouth Rinse BID  . arformoterol  15 mcg Nebulization BID  . budesonide (PULMICORT) nebulizer solution  0.5 mg Nebulization BID  . darbepoetin (ARANESP) injection - NON-DIALYSIS  100 mcg Subcutaneous Q Mon-1800  . diltiazem  10 mg Intravenous Once  . famotidine  20 mg Oral Daily  . feeding supplement (NEPRO CARB STEADY)  237 mL Oral BID BM  . feeding supplement (RESOURCE BREEZE)  1 Container Oral Q1500  . furosemide  120 mg Intravenous 3 times per day  . heparin subcutaneous  5,000 Units Subcutaneous 3 times per day  . ipratropium-albuterol  3 mL Nebulization Q6H  . metoprolol tartrate  25 mg Oral BID  . sodium chloride  10-40 mL Intracatheter Q12H   . sodium chloride 10 mL/hr at 12/10/14 0800  . diltiazem (CARDIZEM) infusion 5 mg/hr (12/12/14 0541)    sodium chloride, sodium chloride, acetaminophen **OR** acetaminophen, albumin human, docusate, feeding supplement (NEPRO CARB STEADY), fentaNYL (SUBLIMAZE) injection, heparin, heparin, lidocaine (PF), lidocaine-prilocaine, ondansetron **OR** ondansetron (ZOFRAN) IV, pentafluoroprop-tetrafluoroeth, RESOURCE THICKENUP CLEAR, sodium chloride, traZODone

## 2014-12-12 NOTE — Progress Notes (Signed)
Physical Therapy Treatment Patient Details Name: Allen Green MRN: [id number] DOB: Oct 14, 1950 Today's Date: 12/12/2014    History of Present Illness Pt is a 65 y/o male admitted on 4/26 with septic shock and ARF due to cholecystitis and an abscess associated with a recently diagnosed gallbladder drain. Course complicated by bibasal HCAP with severe hypoxemia. Pt intubated 4/26-5/5.    PT Comments    Pt unable to tolerate sitting well today, so unable to progress to the chair.  Emphasized sitting tolerance and there ex.   Follow Up Recommendations  CIR;Supervision/Assistance - 24 hour     Equipment Recommendations  Rolling walker with 5" wheels    Recommendations for Other Services Rehab consult     Precautions / Restrictions Precautions Precautions: Fall Precaution Comments: Pt has a left "broken knee replacement". Original TKA was in 1995 and began giving him problems in 2005.    Mobility  Bed Mobility Overal bed mobility: Needs Assistance Bed Mobility: Rolling;Sidelying to Sit;Sit to Sidelying Rolling: Min assist Sidelying to sit: Mod assist;+2 for physical assistance     Sit to sidelying: Mod assist;+2 for physical assistance;+2 for safety/equipment General bed mobility comments: cues and significant truncal assist to overcome the overlay  Transfers                 General transfer comment: unable today due to feeling of "passing out every time he sat up.  Ambulation/Gait                 Stairs            Wheelchair Mobility    Modified Rankin (Stroke Patients Only)       Balance Overall balance assessment: Needs assistance Sitting-balance support: Single extremity supported;Bilateral upper extremity supported Sitting balance-Leahy Scale: Fair Sitting balance - Comments: pt able to maintain sitting on the overlay, but could not accept any challenge.                            Cognition Arousal/Alertness:  Awake/alert Behavior During Therapy: Flat affect Overall Cognitive Status: Within Functional Limits for tasks assessed                      Exercises General Exercises - Lower Extremity Hip Flexion/Marching: AROM;AAROM;Strengthening;Both;10 reps;Supine;Other (comment) (graded resistance) Other Exercises Other Exercises: resisted bicep/tricep press x10 reps    General Comments General comments (skin integrity, edema, etc.): On 2nd sitting trial, pt's sitting BP 128/61 at same time pt expressing "...passing out..."      Pertinent Vitals/Pain Faces Pain Scale: Hurts little more Pain Location: vague Pain Descriptors / Indicators: Grimacing Pain Intervention(s): Monitored during session;Repositioned    Home Living                      Prior Function            PT Goals (current goals can now be found in the care plan section) Acute Rehab PT Goals Patient Stated Goal: Return home PT Goal Formulation: With patient/family Time For Goal Achievement: 12/24/14 Potential to Achieve Goals: Good Progress towards PT goals: Not progressing toward goals - comment (Unable to tolerate sitting today.)    Frequency  Min 3X/week    PT Plan Current plan remains appropriate    Co-evaluation             End of Session Equipment Utilized During Treatment: Oxygen Activity Tolerance: Patient limited  by fatigue Patient left: in bed;with call bell/phone within reach     Time: 1152-1225 PT Time Calculation (min) (ACUTE ONLY): 33 min  Charges:  $Therapeutic Activity: 23-37 mins                    G Codes:      [name], Tessie Fass 12/12/2014, 12:37 PM  12/12/2014  Donnella Sham, PT [phone]  (pager)

## 2014-12-13 ENCOUNTER — Other Ambulatory Visit (HOSPITAL_COMMUNITY): Payer: Medicare Other

## 2014-12-13 ENCOUNTER — Inpatient Hospital Stay (HOSPITAL_COMMUNITY): Payer: Medicare Other

## 2014-12-13 DIAGNOSIS — K810 Acute cholecystitis: Secondary | ICD-10-CM | POA: Diagnosis present

## 2014-12-13 DIAGNOSIS — I4891 Unspecified atrial fibrillation: Secondary | ICD-10-CM | POA: Diagnosis present

## 2014-12-13 DIAGNOSIS — J449 Chronic obstructive pulmonary disease, unspecified: Secondary | ICD-10-CM

## 2014-12-13 DIAGNOSIS — R627 Adult failure to thrive: Secondary | ICD-10-CM | POA: Diagnosis present

## 2014-12-13 DIAGNOSIS — N179 Acute kidney failure, unspecified: Secondary | ICD-10-CM | POA: Diagnosis present

## 2014-12-13 DIAGNOSIS — Z720 Tobacco use: Secondary | ICD-10-CM | POA: Diagnosis present

## 2014-12-13 DIAGNOSIS — R768 Other specified abnormal immunological findings in serum: Secondary | ICD-10-CM | POA: Diagnosis present

## 2014-12-13 DIAGNOSIS — B370 Candidal stomatitis: Secondary | ICD-10-CM | POA: Diagnosis present

## 2014-12-13 DIAGNOSIS — R894 Abnormal immunological findings in specimens from other organs, systems and tissues: Secondary | ICD-10-CM

## 2014-12-13 DIAGNOSIS — A414 Sepsis due to anaerobes: Secondary | ICD-10-CM | POA: Diagnosis present

## 2014-12-13 DIAGNOSIS — J189 Pneumonia, unspecified organism: Secondary | ICD-10-CM | POA: Diagnosis present

## 2014-12-13 LAB — RENAL FUNCTION PANEL
ALBUMIN: 1.9 g/dL — AB (ref 3.5–5.0)
Anion gap: 10 (ref 5–15)
BUN: 72 mg/dL — ABNORMAL HIGH (ref 6–20)
CALCIUM: 8 mg/dL — AB (ref 8.9–10.3)
CO2: 24 mmol/L (ref 22–32)
CREATININE: 7.38 mg/dL — AB (ref 0.61–1.24)
Chloride: 103 mmol/L (ref 101–111)
GFR calc Af Amer: 8 mL/min — ABNORMAL LOW (ref >60)
GFR calc non Af Amer: 7 mL/min — ABNORMAL LOW (ref >60)
Glucose, Bld: 116 mg/dL — ABNORMAL HIGH (ref 65–99)
Phosphorus: 8.2 mg/dL — ABNORMAL HIGH (ref 2.5–4.6)
Potassium: 4.1 mmol/L (ref 3.5–5.1)
Sodium: 137 mmol/L (ref 135–145)

## 2014-12-13 LAB — HEPATIC FUNCTION PANEL
ALBUMIN: 1.9 g/dL — AB (ref 3.5–5.0)
ALK PHOS: 94 U/L (ref 38–126)
ALT: 16 U/L — ABNORMAL LOW (ref 17–63)
AST: 21 U/L (ref 15–41)
Bilirubin, Direct: 0.2 mg/dL (ref 0.1–0.5)
Indirect Bilirubin: 0.5 mg/dL (ref 0.3–0.9)
Total Bilirubin: 0.7 mg/dL (ref 0.3–1.2)
Total Protein: 5.7 g/dL — ABNORMAL LOW (ref 6.5–8.1)

## 2014-12-13 LAB — CBC
HEMATOCRIT: 27.9 % — AB (ref 39.0–52.0)
HEMATOCRIT: 28.4 % — AB (ref 39.0–52.0)
HEMOGLOBIN: 9 g/dL — AB (ref 13.0–17.0)
HEMOGLOBIN: 9.1 g/dL — AB (ref 13.0–17.0)
MCH: 30 pg (ref 26.0–34.0)
MCH: 30.1 pg (ref 26.0–34.0)
MCHC: 32.3 g/dL (ref 30.0–36.0)
MCHC: 32 g/dL (ref 30.0–36.0)
MCV: 93 fL (ref 78.0–100.0)
MCV: 94 fL (ref 78.0–100.0)
PLATELETS: 259 10*3/uL (ref 150–400)
Platelets: 262 10*3/uL (ref 150–400)
RBC: 3 MIL/uL — ABNORMAL LOW (ref 4.22–5.81)
RBC: 3.02 MIL/uL — ABNORMAL LOW (ref 4.22–5.81)
RDW: 15.9 % — AB (ref 11.5–15.5)
RDW: 16 % — AB (ref 11.5–15.5)
WBC: 20.1 10*3/uL — ABNORMAL HIGH (ref 4.0–10.5)
WBC: 19.3 10*3/uL — ABNORMAL HIGH (ref 4.0–10.5)

## 2014-12-13 LAB — MAGNESIUM: Magnesium: 2.2 mg/dL (ref 1.7–2.4)

## 2014-12-13 MED ORDER — PROMETHAZINE HCL 25 MG/ML IJ SOLN
12.5000 mg | Freq: Four times a day (QID) | INTRAMUSCULAR | Status: DC | PRN
Start: 2014-12-13 — End: 2014-12-19
  Administered 2014-12-13: 12.5 mg via INTRAVENOUS
  Filled 2014-12-13: qty 1

## 2014-12-13 MED ORDER — HEPARIN SODIUM (PORCINE) 1000 UNIT/ML DIALYSIS: 20.0000 [IU]/kg | INTRAMUSCULAR | Status: DC | PRN

## 2014-12-13 MED ORDER — ALUM & MAG HYDROXIDE-SIMETH 200-200-20 MG/5ML PO SUSP
30.0000 mL | Freq: Once | ORAL | Status: AC
  Administered 2014-12-13: 30 mL via ORAL
  Filled 2014-12-13: qty 30

## 2014-12-13 MED ORDER — DILTIAZEM HCL 100 MG IV SOLR
5.0000 mg/h | INTRAVENOUS | Status: DC
  Administered 2014-12-14: 2.5 mg/h via INTRAVENOUS
  Filled 2014-12-13: qty 100

## 2014-12-13 MED ORDER — MIDODRINE HCL 5 MG PO TABS
5.0000 mg | ORAL_TABLET | Freq: Three times a day (TID) | ORAL | Status: DC
Start: 2014-12-13 — End: 2014-12-15
  Administered 2014-12-13 – 2014-12-15 (×8): 5 mg via ORAL
  Filled 2014-12-13 (×9): qty 1

## 2014-12-13 MED ORDER — MEGESTROL ACETATE 400 MG/10ML PO SUSP
800.0000 mg | Freq: Every day | ORAL | Status: DC
Start: 2014-12-13 — End: 2014-12-19
  Administered 2014-12-13 – 2014-12-18 (×5): 800 mg via ORAL
  Filled 2014-12-13 (×7): qty 20

## 2014-12-13 NOTE — Progress Notes (Signed)
[place] TEAM 1 - Stepdown/ICU TEAM Progress Note  Allen Green:[id number] DOB: [birth year] DOA: 11/03/2014 PCP: Lovett Calender, AMY, PA  Admit HPI / Brief Narrative: 64 y/o WM PMHx  COPD (2 L O2 when sedentary, 3 L O2 when active), chronic pain syndrome, secondary polycythemia, hepatitis B (40 years ago). Admitted on 4/26 with septic shock and acute respiratory failure due to cholecystitis and an abscess associated with a recently dislodged gallbladder drain. Course complicated by bibasilar HCAP with severe hypoxemia and severe autoPEEP.  HPI/Subjective: 5/12 A/O 4, however daughter states that at times still has strange thoughts makes strange statements. States prior to this hospitalization was still smoking. States has no appetite, positive GERD overnight.  Assessment/Plan:  Acute on chronic respiratory failure with hypercapnea -Patient still requiring significantly increased O2 support to maintain SPO2 89 and 93%, currently on 6 L  Septic shock due to Bacteroides Resolved - due to gallbladder infection  Acute Cholecystis with peri-gallbladder abscess due to dislodged percutaneous drain  s/p drain 4/26 per IR - prior drain 4/3 > 4/12  Leukocytosis -Reactive vs true infection -Obtain PCXR -Obtain blood culture -Obtain urine culture -Culture of percutaneous drain  Acute kidney failure requiring dialysis -Borderline hypotensive  -Midodrine 5mg  TID -Patient HD today  - nephrology following  - urine output picking up  - nephrology deciding whether to place a semipermanent dialysis catheter therefore will hold Coumadin therapy until this decision is made -Strict in and out since admission; +2L   Atrial flutter w/ RVR -CHADSVASC = 2 - care per Cardiology - rate not well controlled, on minimal Cardizem drip.  -initiate Coumadin once permanent dialysis catheter placed or decision made not to place one  Severe COPD Tobacco abuse  -Counseled on absolute need to discontinue  tobacco abuse permanently  BLL HCAP -Clinically resolved? Increasing leukocytosis  Chronic polycythemia due to COPD/hypoxia - now anemic -Due to critical illness and acute kidney failure - no IV iron due to bacteremia - erythropoietin per nephrology  Thrush -Nystatin swish and spit QID   Profound deconditioned state -For eventual CIR placement  Hepatitis B core antibody positive -This isolated positive result is unclear and could represent a resolved infection, false positive test, or even a chronic low-grade infection  - will need to be followed as outpatient  - follow-up LFTs in a.m.  Ileus -Clinically resolved  Failure to thrive -Megace 800 mg daily   Code Status: FULL Family Communication: no family present at time of exam Disposition Plan: CIR vs LTACH in a.m.?    Consultants: Dr.Jayadeep Hassell Done (cardiology) Dr.Rakesh Elsworth S[facility] M) Dr.Kellie Moshe Cipro (nephrology)   Procedure/Significant Events: 4/26 TTE > poor study, can't comment on LV or RV size/function 4/26 admitted - intubated, gallbladder drain placed by IR 4/29 multiple amio boluses for RVR 4/30 worsening hypoxia 5/3 improving pressor needs 5/4 SBTs 5/5 extubated 5/6 off CVVHD 5/8 transfer to [facility] for intermittent HD 5/11 [facility] assumes care   Culture 5/12 blood pending 5/12 urine pending  Antibiotics:   DVT prophylaxis: Subcutaneous heparin   Devices    LINES / TUBES:      Continuous Infusions: . diltiazem (CARDIZEM) infusion Stopped (12/13/14 1147)    Objective: VITAL SIGNS: Temp: 98.4 F (36.9 C) (05/12 1920) Temp Source: Oral (05/12 1920) BP: 99/51 mmHg (05/12 1804) Pulse Rate: 74 (05/12 1717) SPO2; FIO2:   Intake/Output Summary (Last 24 hours) at 12/13/14 2038 Last data filed at 12/13/14 1717  Gross per 24 hour  Intake 102.42 ml  Output   1331 ml  Net -1228.58 ml     Exam: General: A/O 4,No acute respiratory distress Lungs: Clear to auscultation  bilaterally without wheezes or crackles Cardiovascular: Regular rate and rhythm without murmur gallop or rub normal S1 and S2 Abdomen: Nontender, nondistended, soft, bowel sounds positive, no rebound, no ascites, no appreciable mass Extremities: No significant cyanosis, clubbing, or edema bilateral lower extremities  Data Reviewed: Basic Metabolic Panel:  Recent Labs Lab 12/07/14 0520  12/09/14 0516 12/09/14 0517 12/10/14 0520 12/11/14 0558 12/12/14 0525 12/13/14 0505 12/13/14 1205  NA  --   < > 138 138 138 137 133* 137  --   K  --   < > 4.5 4.4 3.8 3.7 3.9 4.1  --   CL  --   < > 102 103 102 102 100* 103  --   CO2  --   < > 21* 22 25 21* 24 24  --   GLUCOSE  --   < > 89 89 78 95 243* 116*  --   BUN  --   < > 116* 116* 64* 90* 52* 72*  --   CREATININE  --   < > 6.39* 6.60* 4.65* 6.70* 5.14* 7.38*  --   CALCIUM  --   < > 8.2* 8.2* 7.9* 8.1* 7.5* 8.0*  --   MG 2.8*  --   --   --   --   --   --   --  2.2  PHOS  --   < > 7.7*  --  6.0* 6.5* 6.2* 8.2*  --   < > = values in this interval not displayed. Liver Function Tests:  Recent Labs Lab 12/09/14 0516 12/10/14 0520 12/11/14 0558 12/12/14 0525 12/13/14 0505  AST  --   --   --   --  21  ALT  --   --   --   --  16*  ALKPHOS  --   --   --   --  94  BILITOT  --   --   --   --  0.7  PROT  --   --   --   --  5.7*  ALBUMIN 1.9* 1.8* 1.8* 1.7* 1.9*  1.9*   No results for input(s): LIPASE, AMYLASE in the last 168 hours. No results for input(s): AMMONIA in the last 168 hours. CBC:  Recent Labs Lab 12/08/14 0530 12/09/14 0517 12/11/14 0558 12/13/14 0505 12/13/14 1205  WBC 22.3* 18.0* 15.7* 20.1* 19.3*  NEUTROABS  --  14.8*  --   --   --   HGB 9.7* 8.8* 9.4* 9.0* 9.1*  HCT 29.8* 26.4* 28.5* 27.9* 28.4*  MCV 91.7 90.4 90.5 93.0 94.0  PLT 260 341 324 262 259   Cardiac Enzymes:  Recent Labs Lab 12/11/14 1940 12/11/14 2330 12/12/14 0525  TROPONINI <0.03 <0.03 <0.03   BNP (last 3 results)  Recent Labs   09/16/14 1616 11/27/14 0320  BNP 34.7 31.0    ProBNP (last 3 results) No results for input(s): PROBNP in the last 8760 hours.  CBG:  Recent Labs Lab 12/09/14 0822 12/09/14 1245 12/09/14 1644 12/09/14 1911 12/09/14 2335  GLUCAP 93 109* 84 82 96    Recent Results (from the past 240 hour(s))  Culture, respiratory (NON-Expectorated)     Status: None   Collection Time: 12/04/14  9:27 AM  Result Value Ref Range Status   Specimen Description TRACHEAL ASPIRATE  Final   Special Requests NONE  Final   Gram Stain   Final    ABUNDANT WBC PRESENT, PREDOMINANTLY PMN NO SQUAMOUS EPITHELIAL CELLS SEEN NO ORGANISMS SEEN RARE YEAST Performed at [facility]    Culture   Final    FEW CANDIDA ALBICANS Performed at [facility]    Report Status 12/06/2014 FINAL  Final  Clostridium Difficile by PCR     Status: None   Collection Time: 12/08/14  5:15 AM  Result Value Ref Range Status   C difficile by pcr NEGATIVE NEGATIVE Final  Clostridium Difficile by PCR     Status: None   Collection Time: 12/12/14 12:35 AM  Result Value Ref Range Status   C difficile by pcr NEGATIVE NEGATIVE Final     Studies:  Recent x-ray studies have been reviewed in detail by the Attending Physician  Scheduled Meds:  Scheduled Meds: . amiodarone  400 mg Oral Daily  . antiseptic oral rinse  7 mL Mouth Rinse BID  . arformoterol  15 mcg Nebulization BID  . budesonide (PULMICORT) nebulizer solution  0.5 mg Nebulization BID  . darbepoetin (ARANESP) injection - NON-DIALYSIS  100 mcg Subcutaneous Q Mon-1800  . diltiazem  10 mg Intravenous Once  . famotidine  20 mg Oral Daily  . feeding supplement (NEPRO CARB STEADY)  237 mL Oral BID BM  . feeding supplement (RESOURCE BREEZE)  1 Container Oral Q1500  . furosemide  120 mg Intravenous 3 times per day  . heparin subcutaneous  5,000 Units Subcutaneous 3 times per day  . ipratropium  0.5 mg Nebulization QID  . levalbuterol  0.63 mg Nebulization  QID  . megestrol  800 mg Oral Daily  . metoprolol tartrate  25 mg Oral BID  . midodrine  5 mg Oral TID WC  . nystatin  5 mL Oral QID    Time spent on care of this patient: 40 mins   Maleke Feria, Geraldo Docker , MD  [facility] Office  [phone] Pager - [phone]  On-Call/Text Page:      [email or web address]      password [email or web address]  If 7PM-7AM, please contact night-coverage [email or web address] Password [email or web address] 12/13/2014, 8:38 PM   LOS: 16 days   Care during the described time interval was provided by me .  I have reviewed this patient's available data, including medical history, events of note, physical examination, radiology studies and test results as part of my evaluation  Dia Crawford, MD [phone] Pager

## 2014-12-13 NOTE — Progress Notes (Signed)
[facility] Progress Note   Subjective:  No acute events overnight. Recently transferred back to SDU on 5/10 and to [facility] on 5/11. Last event with AFlutter with RVR overnight 5/10. Transitioned to Dilt IV, off for period of time and resumed this AM. HR controlled over night but remains in AFib on Amio PO. Last HD Tues 5/10, 1300cc removed. Today patient feels a little better, remains weak, still with oral thrush and limited PO. Tolerating resource supplement PO. Daughter Hoyle Sauer at bedside, updated. Continued with mild reduced UOP 800 to 500cc yesterday. Is maybe more confused to day - also with increased WBC   Filed Vitals:   12/13/14 0700 12/13/14 0715 12/13/14 0730 12/13/14 0731  BP: 92/46 95/47 104/43   Pulse: 49 50 50   Temp:      TempSrc:      Resp: 16 17 18    Height:      Weight:      SpO2: 95% 94% 95% 95%   Exam: Gen - chronically ill-appearing, obese, pleasant and cooperative, NAD Neck - supple, no JVD Heart - irregularly irregular, tachy, no murmurs heard Lungs - Stable scattered exp wheezes. Improved air movement. Non-labored. Abd - soft, NTND, no masses, +active BS Ext - trace bilateral edema, non-tender, peripheral pulses intact +2 b/l Skin - warm, dry, no rashes Neuro - awake, alert, oriented  UNa 51, UCr 136 UA  gran casts, amber, 100 prot, 1.022, 11-20 wbc, no rbc CXR no chg bibasilar airspace dz  Summary: 64 yo w hx COPD/home O2, obesity admitted in April 2015 with acute cholecystitis, too high risk for surgery d/t pulm disease had perc drain placed. Had been managed since with chronic drain with changes per IR.  On 11/03/14 pt was seen by IR for the tube falling out.  They noted  there was not an indication for PCT replacement. On 4/26 patient was admitted w septic shock and gallbladder abcess. Pt was admitted to ICU. IR placed another perc drain on 4/26.  Patient developed AKI/ ATN from shock/ contrast and was treated with CRRT from 4/27-5/6 at [facility].    Assessment: 1. AKI / ATN - from sepsis / contrast. Baseline creatinine normal (0.90 to 1). S/P CRRT 4/27-5/6.    S/P IHD 5/8 and 5/10  with much improvement in numbers.  Slight reduced UOP to 500cc, not yet clearing.   But, with normal baseline renal function am optimistic for recovery.  Vascath in since 4/27 so approaching expiration. 1. Elevated SCr 5.14 (post-HD) to 7.38, BUN 72, phos 8.2. K stable 4.1 2. Plan for HD today, using temp cath. Re-evaluate daily. If still req HD on Sat, will use temp cath again and then order removal ; if needs further HD will need PC next week. Recommend to continue to hold coumadin until possible HD cath over weekend or early next week, could use therapeutic heparin dosing if needed if can be reversed prior to procedure. 2. Bacteroides sepsis/ gallbladder infection, s/p chole tube - off meropenem (5/9) - per primary 3. Volume excess - Improved with HD. no recent CXR. Wt down to 254 (unclear EDW). BP remains low 90-100s/40-50s, suspect will limit volume removal by HD 4. Shock - resolved. Off pressors (5/5) 5. Anemia - multifactorial- Hgb stable 9.0, continue Aranesp 134mcg weekly (start 5/9). Not candidate for iron d/t infection. May transfuse PRBC Hgb < 7.0 PRN 6. A flutter - Per Cardiology. On Amio PO 400mg  daily now, with plans to eventually taper, ultimately considering cardioversion.  Has not started Coumadin yet, anticipate holding until placement of perm cath (over weekend vs early next week). 7. Diarrhea - C.Diff negative 8. Nutrition - Poor PO. Tolerating resource breeze. Nutrition currently following. 9. Dispo - Patient has been accepted into CIR, but transfer pending Renal status. Still hopeful for recovery of renal function. Currently remains on IHD, will receive HD today, re-evaluate daily. May need again on Sat, then would consider switch access for tunneled HD cath for longer-term IHD.  [name], [place],  PGY-2 (Currently working with [facility])  12/13/2014, 7:52 AM   Patient seen and examined, agree with above note with above modifications. Patient looks a little worse clinically to me today.  Needs HD which will be done thru vascath- if needs again , will do Sat via vascath then remove.  If needs further HD will place tunneled cath next week,  [name], MD 12/13/2014        Recent Labs Lab 12/11/14 0558 12/12/14 0525 12/13/14 0505  NA 137 133* 137  K 3.7 3.9 4.1  CL 102 100* 103  CO2 21* 24 24  GLUCOSE 95 243* 116*  BUN 90* 52* 72*  CREATININE 6.70* 5.14* 7.38*  CALCIUM 8.1* 7.5* 8.0*  PHOS 6.5* 6.2* 8.2*    Recent Labs Lab 12/11/14 0558 12/12/14 0525 12/13/14 0505  AST  --   --  21  ALT  --   --  16*  ALKPHOS  --   --  94  BILITOT  --   --  0.7  PROT  --   --  5.7*  ALBUMIN 1.8* 1.7* 1.9*  1.9*    Recent Labs Lab 12/09/14 0517 12/11/14 0558 12/13/14 0505  WBC 18.0* 15.7* 20.1*  NEUTROABS 14.8*  --   --   HGB 8.8* 9.4* 9.0*  HCT 26.4* 28.5* 27.9*  MCV 90.4 90.5 93.0  PLT 341 324 262   . amiodarone  400 mg Oral Daily  . antiseptic oral rinse  7 mL Mouth Rinse BID  . arformoterol  15 mcg Nebulization BID  . budesonide (PULMICORT) nebulizer solution  0.5 mg Nebulization BID  . darbepoetin (ARANESP) injection - NON-DIALYSIS  100 mcg Subcutaneous Q Mon-1800  . diltiazem  10 mg Intravenous Once  . famotidine  20 mg Oral Daily  . feeding supplement (NEPRO CARB STEADY)  237 mL Oral BID BM  . feeding supplement (RESOURCE BREEZE)  1 Container Oral Q1500  . furosemide  120 mg Intravenous 3 times per day  . heparin subcutaneous  5,000 Units Subcutaneous 3 times per day  . ipratropium  0.5 mg Nebulization QID  . levalbuterol  0.63 mg Nebulization QID  . metoprolol tartrate  25 mg Oral BID  . nystatin  5 mL Oral QID     sodium chloride, sodium chloride, acetaminophen **OR** acetaminophen, docusate, feeding supplement (NEPRO CARB STEADY),  fentaNYL (SUBLIMAZE) injection, heparin, lidocaine (PF), lidocaine-prilocaine, ondansetron **OR** ondansetron (ZOFRAN) IV, pentafluoroprop-tetrafluoroeth, promethazine, RESOURCE THICKENUP CLEAR, sodium chloride, traZODone

## 2014-12-13 NOTE — Progress Notes (Signed)
OT Cancellation Note  Patient Details Name: Allen Green MRN: [id number] DOB: [birth year]   Cancelled Treatment:    Reason Eval/Treat Not Completed: Patient at procedure or test/ unavailable. Pt off floor. Will re-attempt as available.   Villa Herb M 12/13/2014, 1:36 PM

## 2014-12-13 NOTE — Progress Notes (Signed)
Patient Name: Allen Green Date of Encounter: 12/13/2014   SUBJECTIVE  No complains. Denies CP or palpitation. Dialysis later today.   CURRENT MEDS . amiodarone  400 mg Oral Daily  . antiseptic oral rinse  7 mL Mouth Rinse BID  . arformoterol  15 mcg Nebulization BID  . budesonide (PULMICORT) nebulizer solution  0.5 mg Nebulization BID  . darbepoetin (ARANESP) injection - NON-DIALYSIS  100 mcg Subcutaneous Q Mon-1800  . diltiazem  10 mg Intravenous Once  . famotidine  20 mg Oral Daily  . feeding supplement (NEPRO CARB STEADY)  237 mL Oral BID BM  . feeding supplement (RESOURCE BREEZE)  1 Container Oral Q1500  . furosemide  120 mg Intravenous 3 times per day  . heparin subcutaneous  5,000 Units Subcutaneous 3 times per day  . ipratropium  0.5 mg Nebulization QID  . levalbuterol  0.63 mg Nebulization QID  . megestrol  800 mg Oral Daily  . metoprolol tartrate  25 mg Oral BID  . midodrine  5 mg Oral TID WC  . nystatin  5 mL Oral QID    OBJECTIVE  Filed Vitals:   12/13/14 1000 12/13/14 1100 12/13/14 1115 12/13/14 1145  BP: 94/50 88/46 90/51  77/48  Pulse: 52 50 50 78  Temp:      TempSrc:      Resp: 22 8 18  0  Height:      Weight:      SpO2: 96% 98% 97% 100%    Intake/Output Summary (Last 24 hours) at 12/13/14 1236 Last data filed at 12/13/14 0700  Gross per 24 hour  Intake 132.42 ml  Output    532 ml  Net -399.58 ml   Filed Weights   12/10/14 2056 12/11/14 0930 12/11/14 1310  Weight: 260 lb 11.2 oz (118.253 kg) 258 lb 13.1 oz (117.4 kg) 254 lb 3.1 oz (115.3 kg)    PHYSICAL EXAM  General: Chronically ill-appearing, leasant, NAD. Neuro: Alert and oriented X 3. Moves all extremities spontaneously. Psych: Responds to questions appropriately with a normal affect. HEENT:  Normal  Neck: JVP not elevated. Lungs:  Resp regular and unlabored. Oxygen is placed. Diminished throughout with very faint expiratory wheezing on auscultation. Heart: Irregular rhythm, regionally  controlled rate (90-100s) , distant heart sounds. S1 S2 without murmurs, rubs, or gallops. . Abdomen: Soft, non-tender, non-distended, BS + x 4.  Extremities: No clubbing, cyanosis or edema. DP/PT/Radials 2+ and equal bilaterally. Compression stocking in place.   Accessory Clinical Findings  CBC  Recent Labs  12/11/14 0558 12/13/14 0505  WBC 15.7* 20.1*  HGB 9.4* 9.0*  HCT 28.5* 27.9*  MCV 90.5 93.0  PLT 324 956   Basic Metabolic Panel  Recent Labs  12/12/14 0525 12/13/14 0505  NA 133* 137  K 3.9 4.1  CL 100* 103  CO2 24 24  GLUCOSE 243* 116*  BUN 52* 72*  CREATININE 5.14* 7.38*  CALCIUM 7.5* 8.0*  PHOS 6.2* 8.2*   Liver Function Tests  Recent Labs  12/12/14 0525 12/13/14 0505  AST  --  21  ALT  --  16*  ALKPHOS  --  94  BILITOT  --  0.7  PROT  --  5.7*  ALBUMIN 1.7* 1.9*  1.9*   Cardiac Enzymes  Recent Labs  12/11/14 1940 12/11/14 2330 12/12/14 0525  TROPONINI <0.03 <0.03 <0.03   Thyroid Function Tests  Recent Labs  12/12/14 1300  TSH 3.037    TELE  A. Flutter. Rate was in  80s up until ~1800 last night, currently maintaining around 100s. Frequent Vent Bigeminy.   ASSESSMENT AND PLAN  58M with COPD, tobacco abuse, Chronic pain, GERD, polycythemia, hepatitis B, poor quality echo 11/2014, nonischemic nuc 2012 who presented 11/17/2014 with septic shock and acute respiratory failure secondary to cholecystitis and abscess from a dislodged gallbladder drain.Also developed AKI requiring CRRT. GB drain placed by IR 4/26. Noted to have new onset atrial flutter this admission.  1. Atrial flutter with RVR 2. Septic shock with bacteroides related to gallbladder disease 3. Acute renal failure due to AKI/ATN, requiring HD this admission 4. Chronic polycythemia, now anemic 5. Profound deconditioning 6. Hypoalbuminemia 1.7  Atrial flutter likely driven by underlying clinical picture. Started on IV amio -> now on 400mg  daily. Interim required additional IV  amio/IV diltiazem. Rate in 100s. Will wait for now to drop amio to 200mg . Continue to f/u HR. TSH normal. Echo pending. Consider changing metoprolol to Bystolic given COPD/wheezing. May also be able to change IV diltiazem to oral soon - but want to see what EF is in case we have to shift to a BB-only strategy. CHADSVASC = 2.  Hemoccult positive for blood. Hemoglobin 9.0 today, was 9.4 yesterday, however it was 8.8 two days ago. Currently on DVT ppx heparin only. Further management per primary. Will hold on anticoagulation with coumadin until OK with primary team. Not a candidate for NOACs acutely with fluctuating renal function. May try to convert him back to sinus rhythm, once he is improved and if he completes 4 weeks of anticoagulation.  Signed, Bhagat,Bhavinkumar PA-C   I have examined the patient and reviewed assessment and plan and discussed with patient.  Agree with above as stated.  Rate controlled.  Anticoagulation is difficult because of heme positive stools.  Continue amiodarone.   Allen Fayson S.

## 2014-12-13 NOTE — Procedures (Addendum)
Patient seen on Hemodialysis. QB 300, UF goal 3L, TDC intermittently dysfunctional Treatment adjusted as needed.  Elmarie Shiley MD [facility]. Office # [phone] Pager # [phone] 3:43 PM

## 2014-12-14 ENCOUNTER — Inpatient Hospital Stay (HOSPITAL_COMMUNITY): Payer: Medicare Other

## 2014-12-14 ENCOUNTER — Encounter (HOSPITAL_COMMUNITY): Payer: Self-pay | Admitting: Radiology

## 2014-12-14 DIAGNOSIS — I4891 Unspecified atrial fibrillation: Secondary | ICD-10-CM

## 2014-12-14 DIAGNOSIS — N179 Acute kidney failure, unspecified: Secondary | ICD-10-CM | POA: Diagnosis present

## 2014-12-14 LAB — COMPREHENSIVE METABOLIC PANEL
ALT: 15 U/L — ABNORMAL LOW (ref 17–63)
AST: 18 U/L (ref 15–41)
Albumin: 1.9 g/dL — ABNORMAL LOW (ref 3.5–5.0)
Alkaline Phosphatase: 88 U/L (ref 38–126)
Anion gap: 12 (ref 5–15)
BUN: 52 mg/dL — AB (ref 6–20)
CALCIUM: 7.8 mg/dL — AB (ref 8.9–10.3)
CO2: 25 mmol/L (ref 22–32)
Chloride: 99 mmol/L — ABNORMAL LOW (ref 101–111)
Creatinine, Ser: 6.34 mg/dL — ABNORMAL HIGH (ref 0.61–1.24)
GFR calc Af Amer: 10 mL/min — ABNORMAL LOW (ref >60)
GFR calc non Af Amer: 8 mL/min — ABNORMAL LOW (ref >60)
GLUCOSE: 108 mg/dL — AB (ref 65–99)
POTASSIUM: 3.8 mmol/L (ref 3.5–5.1)
SODIUM: 136 mmol/L (ref 135–145)
TOTAL PROTEIN: 6 g/dL — AB (ref 6.5–8.1)
Total Bilirubin: 0.8 mg/dL (ref 0.3–1.2)

## 2014-12-14 LAB — URINE CULTURE
Colony Count: NO GROWTH
Culture: NO GROWTH

## 2014-12-14 LAB — MAGNESIUM: Magnesium: 2 mg/dL (ref 1.7–2.4)

## 2014-12-14 LAB — PHOSPHORUS: Phosphorus: 5.7 mg/dL — ABNORMAL HIGH (ref 2.5–4.6)

## 2014-12-14 MED ORDER — PERFLUTREN LIPID MICROSPHERE
1.0000 mL | INTRAVENOUS | Status: DC | PRN
  Administered 2014-12-14: 8 mL via INTRAVENOUS
  Filled 2014-12-14: qty 10

## 2014-12-14 MED ORDER — DILTIAZEM HCL 30 MG PO TABS
30.0000 mg | ORAL_TABLET | Freq: Four times a day (QID) | ORAL | Status: DC
  Administered 2014-12-14 – 2014-12-19 (×16): 30 mg via ORAL
  Filled 2014-12-14 (×21): qty 1

## 2014-12-14 NOTE — Progress Notes (Signed)
[place] TEAM 1 - Stepdown/ICU TEAM Progress Note  Allen Green TMA:[id number] DOB: [birth year] DOA: 11/05/2014 PCP: Linton Rump, PA  Admit HPI / Brief Narrative: 64 y/o male admitted on 4/26 with septic shock and acute respiratory failure due to cholecystitis and an abscess associated with a recently dislodged gallbladder drain.  Course complicated by bibasilar HCAP with severe hypoxemia and severe autoPEEP.  Significant Events: 4/26 admitted - intubated, gallbladder drain placed by IR 4/29 multiple amio boluses for RVR 4/30 worsening hypoxia 5/3 improving pressor needs 5/4 SBTs 5/5 extubated 5/6 off CVVHD 5/8 transfer to [facility] for intermittent HD 5/11 [facility] assumes care  HPI/Subjective: Patient is resting comfortably in bed.  He denies chest pain nausea vomiting fevers chills or shortness of breath.  Assessment/Plan:  Acute respiratory failure with hypercapnea Resolved  Septic shock due to Bacteroides Resolved - due to gallbladder infection - antibiotic treatment course completed  Acute Cholecystis with peri-gallbladder abscess due to dislodged percutaneous drain  s/p drain 4/26 per IR - prior drain 4/3 > 4/12  Acute kidney failure requiring dialysis Due to sepsis and contrast exposure - nephrology following - urine output picking up - nephrology deciding whether to place a semipermanent dialysis catheter therefore will hold Coumadin therapy until this decision is made - Lasix drip continues per nephrology  Atrial flutter w/ RVR CHADSVASC = 2 - care per Cardiology - rate control has been a challenge in the setting of hemodialysis initiation due to hypotension - additionally IV access has been difficult and IV Cardizem and IV Lasix are apparently not compatible - initiate Coumadin once permanent dialysis catheter placed or decision made not to place one - will attempt to transition to oral Cardizem today  Severe COPD Tobacco abuse  Counseled on absolute need to discontinue  tobacco abuse permanently  Ileus Clinically resolved  BLL HCAP Clinically resolved  Chronic polycythemia due to COPD/hypoxia - now anemic Due to critical illness and acute kidney failure - no IV iron due to bacteremia - erythropoietin per nephrology  Thrush Treatment initiated  Profound deconditioned state For eventual CIR placement  Hepatitis B core antibody positive This isolated positive result is unclear and could represent a resolved infection, false positive test, or even a chronic low-grade infection - will need to be followed as outpatient - follow-up LFTs in a.m.  Code Status: FULL Family Communication: No family present at time of exam today Disposition Plan: SDU until heart rate better controlled  Consultants: [facility] Cardiology Nephrology Pulmonary  Procedures: 4/26 TTE > poor study, can't comment on LV or RV size/function  Antibiotics: None  DVT prophylaxis: Subcutaneous heparin  Objective: Blood pressure 108/38, pulse 120, temperature 98.2 F (36.8 C), temperature source Oral, resp. rate 19, height 5\' 11"  (1.803 m), weight 114.9 kg (253 lb 4.9 oz), SpO2 95 %.  Intake/Output Summary (Last 24 hours) at 12/14/14 1521 Last data filed at 12/14/14 1500  Gross per 24 hour  Intake 577.79 ml  Output   1450 ml  Net -872.21 ml   Exam: General: No acute respiratory distress - alert and conversant Lungs: Clear to auscultation bilaterally without wheezes or crackles Cardiovascular: Irregularly irregular with rate approximately 120 bpm without appreciable murmur Abdomen: Nontender, nondistended, soft, bowel sounds positive, no rebound, no ascites, no appreciable mass Extremities: No significant cyanosis, clubbing;  1+ edema bilateral lower extremities  Data Reviewed: Basic Metabolic Panel:  Recent Labs Lab 12/10/14 0520 12/11/14 0558 12/12/14 0525 12/13/14 0505 12/13/14 1205 12/14/14 0500  NA 138 137 133*  137  --  136  K 3.8 3.7 3.9 4.1  --  3.8  CL  102 102 100* 103  --  99*  CO2 25 21* 24 24  --  25  GLUCOSE 78 95 243* 116*  --  108*  BUN 64* 90* 52* 72*  --  52*  CREATININE 4.65* 6.70* 5.14* 7.38*  --  6.34*  CALCIUM 7.9* 8.1* 7.5* 8.0*  --  7.8*  MG  --   --   --   --  2.2 2.0  PHOS 6.0* 6.5* 6.2* 8.2*  --  5.7*    CBC:  Recent Labs Lab 12/08/14 0530 12/09/14 0517 12/11/14 0558 12/13/14 0505 12/13/14 1205  WBC 22.3* 18.0* 15.7* 20.1* 19.3*  NEUTROABS  --  14.8*  --   --   --   HGB 9.7* 8.8* 9.4* 9.0* 9.1*  HCT 29.8* 26.4* 28.5* 27.9* 28.4*  MCV 91.7 90.4 90.5 93.0 94.0  PLT 260 341 324 262 259    Liver Function Tests:  Recent Labs Lab 12/10/14 0520 12/11/14 0558 12/12/14 0525 12/13/14 0505 12/14/14 0500  AST  --   --   --  21 18  ALT  --   --   --  16* 15*  ALKPHOS  --   --   --  94 88  BILITOT  --   --   --  0.7 0.8  PROT  --   --   --  5.7* 6.0*  ALBUMIN 1.8* 1.8* 1.7* 1.9*  1.9* 1.9*    Recent Labs Lab 12/07/14 2100  APTT 33   Cardiac Enzymes:  Recent Labs Lab 12/11/14 1940 12/11/14 2330 12/12/14 0525  TROPONINI <0.03 <0.03 <0.03    CBG:  Recent Labs Lab 12/09/14 0822 12/09/14 1245 12/09/14 1644 12/09/14 1911 12/09/14 2335  GLUCAP 93 109* 84 82 96    Recent Results (from the past 240 hour(s))  Clostridium Difficile by PCR     Status: None   Collection Time: 12/08/14  5:15 AM  Result Value Ref Range Status   C difficile by pcr NEGATIVE NEGATIVE Final  Clostridium Difficile by PCR     Status: None   Collection Time: 12/12/14 12:35 AM  Result Value Ref Range Status   C difficile by pcr NEGATIVE NEGATIVE Final  Culture, Urine     Status: None   Collection Time: 12/13/14 11:12 AM  Result Value Ref Range Status   Specimen Description URINE, CATHETERIZED  Final   Special Requests NONE  Final   Colony Count NO GROWTH Performed at [facility]   Final   Culture NO GROWTH Performed at [facility]   Final   Report Status 12/14/2014 FINAL  Final  Culture,  blood (routine x 2)     Status: None (Preliminary result)   Collection Time: 12/13/14 11:55 AM  Result Value Ref Range Status   Specimen Description BLOOD RIGHT HAND  Final   Special Requests AEB 3CC  Final   Culture   Final           BLOOD CULTURE RECEIVED NO GROWTH TO DATE CULTURE WILL BE HELD FOR 5 DAYS BEFORE ISSUING A FINAL NEGATIVE REPORT Performed at [facility]    Report Status PENDING  Incomplete  Culture, blood (routine x 2)     Status: None (Preliminary result)   Collection Time: 12/13/14 12:05 PM  Result Value Ref Range Status   Specimen Description BLOOD LEFT ARM  Final  Special Requests BAA 10CC  Final   Culture   Final           BLOOD CULTURE RECEIVED NO GROWTH TO DATE CULTURE WILL BE HELD FOR 5 DAYS BEFORE ISSUING A FINAL NEGATIVE REPORT Performed at [facility]    Report Status PENDING  Incomplete  Body fluid culture     Status: None (Preliminary result)   Collection Time: 12/13/14 11:05 PM  Result Value Ref Range Status   Specimen Description FLUID  Final   Special Requests GALL BLADDER  Final   Gram Stain   Final    NO WBC SEEN NO ORGANISMS SEEN Performed at [facility]    Culture PENDING  Incomplete   Report Status PENDING  Incomplete     Studies:   Recent x-ray studies have been reviewed in detail by the Attending Physician  Scheduled Meds:  Scheduled Meds: . amiodarone  400 mg Oral Daily  . antiseptic oral rinse  7 mL Mouth Rinse BID  . arformoterol  15 mcg Nebulization BID  . budesonide (PULMICORT) nebulizer solution  0.5 mg Nebulization BID  . darbepoetin (ARANESP) injection - NON-DIALYSIS  100 mcg Subcutaneous Q Mon-1800  . diltiazem  10 mg Intravenous Once  . famotidine  20 mg Oral Daily  . feeding supplement (NEPRO CARB STEADY)  237 mL Oral BID BM  . feeding supplement (RESOURCE BREEZE)  1 Container Oral Q1500  . furosemide  120 mg Intravenous 3 times per day  . heparin subcutaneous  5,000 Units Subcutaneous 3  times per day  . ipratropium  0.5 mg Nebulization QID  . levalbuterol  0.63 mg Nebulization QID  . megestrol  800 mg Oral Daily  . metoprolol tartrate  25 mg Oral BID  . midodrine  5 mg Oral TID WC  . nystatin  5 mL Oral QID    Time spent on care of this patient: 35 mins   Orlinda Slomski T , MD   [facility] Office  [phone] Pager - Text Page per Shea Evans as per below:  On-Call/Text Page:      [email or web address]      password [email or web address]  If 7PM-7AM, please contact night-coverage [email or web address] Password [email or web address] 12/14/2014, 3:21 PM   LOS: 17 days

## 2014-12-14 NOTE — H&P (Signed)
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  Effusions:  None.  Other:  None.  IMPRESSION: 1. Stable support apparatus. 2. Increasing basilar opacity likely due to atelectasis.   Electronically Signed   By: Dereck Ligas M.D.   On: 11/29/2014 07:31   Dg Chest Port 1 View  11/28/2014   CLINICAL DATA:  Acute respiratory failure.  EXAM: PORTABLE CHEST - 1 VIEW  COMPARISON:  11/28/2014 at 0458 hours  FINDINGS: Endotracheal tube remains in place and as well above the carina. Right jugular central venous catheter has been placed and terminates over the upper SVC. Left jugular catheter remains in place, terminating over the upper SVC. Enteric tube courses into the left upper abdomen with tip not imaged. Pulmonary vascular congestion bilateral interstitial opacities overall appear slightly worse compared to the prior study. No pleural effusion or pneumothorax is identified. Cardiomediastinal silhouette is unchanged.  IMPRESSION: 1. New right jugular catheter terminates over the upper SVC. 2. Slight worsening of interstitial edema.   Electronically Signed   By: Logan Bores   On: 11/28/2014 16:20   Dg Chest Port 1 View  11/28/2014   CLINICAL DATA:  Respiratory failure, COPD, septic shock.  EXAM: PORTABLE CHEST - 1 VIEW  COMPARISON:  Portable chest  x-ray of November 27, 2014  FINDINGS: The lungs are adequately inflated. The interstitial markings are coarse. There is no alveolar infiltrate. There is no pleural effusion. The cardiac silhouette is mildly enlarged. The pulmonary vascularity is mildly prominent centrally. The endotracheal tube tip projects 4.4 cm above the carina.  The esophagogastric tube tip projects below the inferior margin of the image. The left internal jugular venous catheter tip projects over the proximal SVC.  IMPRESSION: Slight interval increase in pulmonary interstitial edema. There is no alveolar pneumonia nor significant pleural effusion. The support tubes are in appropriate position.   Electronically Signed   By: David  Martinique M.D.   On: 11/28/2014 07:31   Dg Chest Port 1 View  11/27/2014   CLINICAL DATA:  Respiratory failure.  EXAM: PORTABLE CHEST - 1 VIEW  COMPARISON:  One-view chest x-ray from the same day at 4:52 a.m.  FINDINGS: The patient is now intubated. The endotracheal tube terminates 6 cm above the carina. A left IJ line has been placed. The tip is in the mid SVC. There is no pneumothorax. An NG tube courses off the inferior border of the film.  The heart size is normal. Mild pulmonary vascular congestion is stable. Atherosclerotic calcifications are again noted within the aortic arch. The lungs are otherwise clear.  IMPRESSION: 1. Interval intubation. Satisfactory positioning of the endotracheal tube. 2. Satisfactory positioning of a new left IJ line without radiographic evidence for complication. 3. Atherosclerosis. 4. Stable pulmonary vascular congestion.   Electronically Signed   By: San Morelle M.D.   On: 11/27/2014 09:31   Dg Chest Port 1 View  11/27/2014   CLINICAL DATA:  Acute onset of shortness of breath. Initial encounter.  EXAM: PORTABLE CHEST - 1 VIEW  COMPARISON:  Chest radiograph performed 11/30/2014  FINDINGS: The lungs are well-aerated. Vascular congestion is noted, with mildly increased  interstitial markings, possibly reflecting mild interstitial edema. As noted on prior CT, pneumonia could have a similar appearance. No pleural effusion or pneumothorax is seen.  The cardiomediastinal silhouette is within normal limits. No acute osseous abnormalities are seen.  IMPRESSION: Vascular congestion, with mildly increased interstitial markings, possibly reflecting mild interstitial edema. As noted on prior CT, pneumonia could have a similar appearance.   Electronically Signed   By: Garald Balding M.D.   On: 11/27/2014 06:01   Dg Chest Port 1 View  11/17/2014   CLINICAL DATA:  Patient is white states he had pneumonia with percutaneous drain that fell out. Drain was on right side of abdomen. Shortness of breath.  EXAM: PORTABLE CHEST - 1 VIEW  COMPARISON:  11/19/2014  FINDINGS: Patient slightly rotated to the right. Lungs are adequately inflated with mild prominence of the perihilar markings suggesting mild vascular congestion. No focal lobar consolidation or effusion. Cardiomediastinal silhouette and remainder of the exam is unchanged.  IMPRESSION: Findings suggesting mild vascular congestion.   Electronically Signed   By: Marin Olp M.D.   On: 11/25/2014 22:05    Labs:  CBC:  Recent Labs  12/09/14 0517 12/11/14 0558 12/13/14 0505 12/13/14 1205  WBC 18.0* 15.7* 20.1* 19.3*  HGB 8.8* 9.4* 9.0* 9.1*  HCT 26.4* 28.5* 27.9* 28.4*  PLT 341 324 262 259    COAGS:  Recent Labs  01/25/14 1453 04/04/14 1028 11/27/14 0525 11/28/14 0310  12/07/14 0355 12/07/14 0755 12/07/14 1155 12/07/14 2100  INR 1.17 1.04 1.08 1.36  --   --   --   --   --   APTT 47* 34 30 38*  < > 59* 84* 63* 33  < > = values in this interval not displayed.  BMP:  Recent Labs  12/11/14 0558 12/12/14 0525 12/13/14 0505 12/14/14 0500  NA 137 133* 137 136  K 3.7 3.9 4.1 3.8  CL 102 100*  103 99*  CO2 21* 24 24 25   GLUCOSE 95 243* 116* 108*  BUN 90* 52* 72* 52*  CALCIUM 8.1* 7.5* 8.0* 7.8*    CREATININE 6.70* 5.14* 7.38* 6.34*  GFRNONAA 8* 11* 7* 8*  GFRAA 9* 13* 8* 10*    LIVER FUNCTION TESTS:  Recent Labs  11/21/2014 2154 11/27/14 0320  12/11/14 0558 12/12/14 0525 12/13/14 0505 12/14/14 0500  BILITOT 0.4 0.8  --   --   --  0.7 0.8  AST 18 18  --   --   --  21 18  ALT 21 20  --   --   --  16* 15*  ALKPHOS 72 70  --   --   --  94 88  PROT 7.4 6.8  --   --   --  5.7* 6.0*  ALBUMIN 3.7 3.3*  < > 1.8* 1.7* 1.9*  1.9* 1.9*  < > = values in this interval not displayed.  Assessment and Plan: Acute kidney injury/ATN secondary to sepsis/contrast Acute resp failure, resolved  Acute Cholecystitis with peri-gb abscess s/p replacement of perc chole 11/27/14, no longer on antibiotics, afebrile, recent BCx no growth Leukocytosis persistent, C. Diff negative, BCx no growth, GB fluid no growth x 1 day, afebrile Aflutter RVR on rate control gtt, cardiology following on sq heparin only- fecal occult positive  Will keep NPO for possible Monday tunneled HD catheter, hold sq heparin on Monday and recheck CBC with diff Risks and Benefits discussed with the patient including, but not limited to bleeding, infection, vascular injury, pneumothorax which may require chest tube placement, air embolism or even death All of the patient's questions were answered, patient is agreeable to proceed. Consent signed and in chart. COPD   Thank you for this interesting consult.  I greatly enjoyed meeting Marlan Steward [place] and look forward to participating in their care.  SignedHedy Jacob 12/14/2014, 1:30 PM   I spent a total of 40 Minutes in face to face in clinical consultation, greater than 50% of which was counseling/coordinating care for acute kidney injury

## 2014-12-14 NOTE — Progress Notes (Signed)
Physical Therapy Treatment Patient Details Name: Allen Green MRN: [id number] DOB: 04-25-1951 Today's Date: 12/14/2014    History of Present Illness Pt is a 64 y/o male admitted on 4/26 with septic shock and ARF due to cholecystitis and an abscess associated with a recently diagnosed gallbladder drain. Course complicated by bibasal HCAP with severe hypoxemia. Pt intubated 4/26-5/5.    PT Comments    Slow progress limited by pain and activity tolerance  Follow Up Recommendations  CIR;Supervision/Assistance - 24 hour     Equipment Recommendations  Rolling walker with 5" wheels    Recommendations for Other Services Rehab consult     Precautions / Restrictions Precautions Precautions: Fall Precaution Comments: Pt has a left "broken knee replacement". Original TKA was in 1995 and began giving him problems in 2005. Restrictions Weight Bearing Restrictions: No    Mobility  Bed Mobility Overal bed mobility: Needs Assistance Bed Mobility: Supine to Sit;Sit to Supine     Supine to sit: Mod assist     General bed mobility comments: up via R elbow and assisted to decrease stomach discomfort  Transfers Overall transfer level: Needs assistance Equipment used: Rolling walker (2 wheeled) Transfers: Sit to/from Stand Sit to Stand: Min assist;+2 safety/equipment         General transfer comment: lifting and coming forward assist  Ambulation/Gait Ambulation/Gait assistance: Mod assist Ambulation Distance (Feet): 6 Feet Assistive device: Rolling walker (2 wheeled) Gait Pattern/deviations: Step-to pattern   Gait velocity interpretation: Below normal speed for age/gender General Gait Details: assist to stabilize due to broke L knee prosthesis and weakness   Stairs            Wheelchair Mobility    Modified Rankin (Stroke Patients Only)       Balance Overall balance assessment: Needs assistance Sitting-balance support: Feet supported;Single extremity supported;No  upper extremity supported Sitting balance-Leahy Scale: Fair Sitting balance - Comments: pt able to maintain sitting on the overlay, but could not accept any challenge.   Standing balance support: No upper extremity supported Standing balance-Leahy Scale: Poor Standing balance comment: based on standing for pericare and need for RW                    Cognition Arousal/Alertness: Awake/alert Behavior During Therapy: Flat affect Overall Cognitive Status: Within Functional Limits for tasks assessed                      Exercises General Exercises - Lower Extremity Hip Flexion/Marching: AROM;Strengthening;Both;10 reps;Supine;Other (comment) (graded resistance)    General Comments General comments (skin integrity, edema, etc.): HR in the upper 120s, sats in the low 90's on 3-5 L O2      Pertinent Vitals/Pain Pain Assessment: Faces Faces Pain Scale: Hurts little more Pain Location: stomach Pain Descriptors / Indicators: Grimacing Pain Intervention(s): Limited activity within patient's tolerance;Monitored during session;Repositioned    Home Living Family/patient expects to be discharged to:: Inpatient rehab Living Arrangements: Spouse/significant other                  Prior Function Level of Independence: Independent with assistive device(s)      Comments: typically uses SPC, ambulatory approx 50 feet, usually on 2L at rest, sometimes increases 3-4L with activity, has pulse oximeter at home   PT Goals (current goals can now be found in the care plan section) Acute Rehab PT Goals Patient Stated Goal: Return home PT Goal Formulation: With patient/family Time For Goal Achievement: 12/24/14 Potential  to Achieve Goals: Good Progress towards PT goals: Progressing toward goals    Frequency  Min 3X/week    PT Plan Current plan remains appropriate    Co-evaluation             End of Session   Activity Tolerance: Patient limited by fatigue;Patient  limited by pain Patient left: in chair;with call bell/phone within reach;with family/visitor present     Time: 1320-1406 PT Time Calculation (min) (ACUTE ONLY): 46 min  Charges:  $Gait Training: 8-22 mins $Therapeutic Activity: 8-22 mins                    G Codes:      Niki Cosman, Tessie Fass 12/14/2014, 4:30 PM 12/14/2014  Donnella Sham, [place] [phone]  (pager)

## 2014-12-14 NOTE — Progress Notes (Signed)
Clinical Impression: PTA pt lived at home and was independent with ADLs with use of SPC. Pt currently limited by weakness, deconditioning, and cardiopulmonary status. Pt requires assist for mobility and for LB ADLs. Pt will benefit from acute OT and is a candidate for CIR to progress to Supervision/Mod I level to return home with family support.     12/14/14 1500  OT Visit Information  Last OT Received On 12/14/14  Assistance Needed +2  History of Present Illness Pt is a 64 y/o male admitted on 4/26 with septic shock and ARF due to cholecystitis and an abscess associated with a recently diagnosed gallbladder drain. Course complicated by bibasal HCAP with severe hypoxemia. Pt intubated 4/26-5/5.  Precautions  Precautions Fall  Precaution Comments Pt has a left "broken knee replacement". Original TKA was in 1995 and began giving him problems in 2005.  Restrictions  Weight Bearing Restrictions No  Home Living  Family/patient expects to be discharged to: Inpatient rehab  Living Arrangements Spouse/significant other  Prior Function  Level of Independence Independent with assistive device(s)  Comments typically uses SPC, ambulatory approx 50 feet, usually on 2L at rest, sometimes increases 3-4L with activity, has pulse oximeter at home  Communication  Communication HOH  Pain Assessment  Pain Assessment Faces  Faces Pain Scale 4  Pain Location grimacing; c/o stomach pain  Pain Descriptors / Indicators Grimacing;Discomfort  Pain Intervention(s) Limited activity within patient's tolerance;Monitored during session;Repositioned  Cognition  Arousal/Alertness Awake/alert  Behavior During Therapy Flat affect  Overall Cognitive Status Within Functional Limits for tasks assessed  Upper Extremity Assessment  Upper Extremity Assessment Generalized weakness  Lower Extremity Assessment  Lower Extremity Assessment Generalized weakness;Defer to PT evaluation  Cervical / Trunk Assessment  Cervical /  Trunk Assessment Other exceptions  Cervical / Trunk Exceptions Forward head posture  ADL  Overall ADL's  Needs assistance/impaired  Eating/Feeding Independent;Sitting  Grooming Set up;Sitting  Upper Body Bathing Sitting;Set up  Lower Body Bathing Moderate assistance;+2 for physical assistance;Sit to/from stand  Upper Body Dressing  Set up;Sitting  Lower Body Dressing Maximal assistance;+2 for physical assistance;Sit to/from stand  Vision- Assessment  Additional Comments No change from baseline  Perception  Perception Tested? No  Praxis  Praxis tested? Not tested  Bed Mobility  Overal bed mobility Needs Assistance  Bed Mobility Supine to Sit;Sit to Supine  Supine to sit Mod assist  General bed mobility comments up via R elbow and assisted to decrease stomach discomfort  Transfers  Overall transfer level Needs assistance  Equipment used Rolling walker (2 wheeled)  Sit to Stand Min assist;+2 safety/equipment  General transfer comment lifting and coming forward assist  Balance  Overall balance assessment Needs assistance  Sitting-balance support Single extremity supported;No upper extremity supported;Feet supported  Sitting balance-Leahy Scale Fair  Sitting balance - Comments pt able to maintain sitting on the overlay, but could not accept any challenge.  Standing balance support No upper extremity supported;During functional activity  Standing balance-Leahy Scale Poor  General Comments  General comments (skin integrity, edema, etc.) HR in the upper 120a, sats in the low 90's on 3-5 L O2  OT - End of Session  Equipment Utilized During Treatment Gait belt;Rolling walker;Oxygen  Activity Tolerance Patient limited by fatigue;Patient tolerated treatment well  Patient left in chair;with call bell/phone within reach;with family/visitor present  OT Assessment  OT Therapy Diagnosis  Generalized weakness;Acute pain  OT Recommendation/Assessment Patient needs continued OT Services  OT  Problem List Decreased strength;Decreased activity tolerance;Impaired balance (sitting  and/or standing);Decreased knowledge of precautions;Cardiopulmonary status limiting activity;Decreased knowledge of use of DME or AE;Pain  OT Plan  OT Frequency (ACUTE ONLY) Min 2X/week  OT Treatment/Interventions (ACUTE ONLY) Self-care/ADL training;Therapeutic exercise;Energy conservation;DME and/or AE instruction;Therapeutic activities;Patient/family education;Balance training  OT Recommendation  Follow Up Recommendations CIR  OT Equipment Other (comment) (defer to next venue)  Individuals Consulted  Consulted and Agree with Results and Recommendations Patient  Acute Rehab OT Goals  Patient Stated Goal Return home  OT Goal Formulation With patient  Time For Goal Achievement 12/28/14  Potential to Achieve Goals Good  OT Time Calculation  OT Start Time (ACUTE ONLY) 1320  OT Stop Time (ACUTE ONLY) 1406  OT Time Calculation (min) 46 min  OT General Charges  $OT Visit 1 Procedure  OT Evaluation  $Initial OT Evaluation Tier I 1 Procedure    Cyndie Chime, OTR/L Occupational Therapist [phone] (pager)

## 2014-12-14 NOTE — Progress Notes (Signed)
Medicare Important Message given? YES (If response is "NO", the following Medicare IM given date fields will be blank) Date Medicare IM given: 12/14/14 Medicare IM given by: Macario Shear 

## 2014-12-14 NOTE — Progress Notes (Signed)
Allen Green Progress Note   Subjective:  No acute events overnight. Remains on Dilt IV gtt and Amio PO, rate seems controlled overnight.  Last HD Thurs 5/12, 1100cc removed.  Today patient states that he feels "better". Family at bedside. Tolerated some improved PO today, limited yesterday. Not drinking much at all. No new complaints. Did have fecal occult positive yesterday, denies rectal bleeding, no prior colonoscopy. Foley in place, significant decreased UOP per charted amt yesterday, 35cc (clear-yellow urine). Overall net positive balance.   Filed Vitals:   12/14/14 0424 12/14/14 0500 12/14/14 0600 12/14/14 0700  BP:  129/71 108/64 122/50  Pulse:  51 52 79  Temp: 98 F (36.7 C)     TempSrc: Oral     Resp:  17 18 17   Height:      Weight:      SpO2:  97% 100% 94%   Exam: Gen - chronically ill-appearing, obese, cooperative, more animated today, NAD Neck - supple, no JVD Heart - irregularly irregular, tachy, no murmurs heard Lungs - Improved exp wheezing, just finished neb treatment. Improved air movement. Non-labored. Abd - soft, NTND, no masses, +active BS Ext - Stable trace to +1 bilateral non-pitting dependent edema, non-tender, peripheral pulses intact +2 b/l Skin - warm, dry, no rashes Neuro - awake, alert, oriented  UNa 51, UCr 136 UA  gran casts, amber, 100 prot, 1.022, 11-20 wbc, no rbc CXR no chg bibasilar airspace dz  Summary: 64 yo w hx COPD/home O2, obesity admitted in April 2015 with acute cholecystitis, too high risk for surgery d/t pulm disease had perc drain placed. Had been managed since with chronic drain with changes per IR.  On 11/03/14 pt was seen by IR for the tube falling out.  They noted  there was not an indication for PCT replacement. On 4/26 patient was admitted w septic shock and gallbladder abcess. Pt was admitted to ICU. IR placed another perc drain on 4/26.  Patient developed AKI/ ATN from shock/ contrast and was treated with CRRT  from 4/27-5/6 at [facility].   Assessment: 1. AKI / ATN - from sepsis / contrast. Baseline creatinine normal (0.90 to 1). S/P CRRT 4/27-5/6.    S/P IHD 5/8 and 5/10 and 5/12.  Slight reduced UOP to 500cc, not yet clearing.   But, with normal baseline renal function am optimistic for recovery. 1. Last HD 5/12 via R-IJ vascath (placed 4/27 - anticipate removal soon) 2. Improved SCr 7.38 (pre-HD) to 6.34 (post-HD), BUN 52, phos 5.7, K stable 3.8 3. Anticipate next HD for Sat 5/14, will use temp cath again and then order removal 4. Consulted IR and ordered tunneled HD cath placement for Monday 5/16, ordered NPO after MN for Monday AM. Recommend continue to HOLD coumadin. 2. Bacteroides sepsis/ gallbladder infection, s/p chole tube - off meropenem (5/9) - per primary 3. Volume excess - Improved with HD. no recent CXR. Wt down to 253 (unclear EDW). BP remains low 100s, suspect will limit volume removal by HD 4. Shock - resolved. Off pressors (5/5) 5. Anemia - multifactorial- Hgb stable 9.1, continue Aranesp 113mcg weekly (start 5/9). Not candidate for iron d/t infection. May transfuse PRBC Hgb < 7.0 PRN 6. A flutter - Per Cardiology. On Amio PO 400mg  daily now, with plans to eventually taper, ultimately considering cardioversion. Has not started Coumadin yet, anticipate holding until placement of tunneled HD cath on Monday 5/16. Complicated now by fecal occult positive. 7. Fecal Occult Positive - No chart history of  prior colonoscopy. 8. Diarrhea - C.Diff negative 9. Nutrition - Poor PO. Tolerating resource breeze. Nutrition currently following. 10. Dispo - Patient has been accepted into CIR, but transfer pending Renal status. Still hopeful for recovery of renal function. Currently remains on IHD, re-evaluate daily. Will need repeat IHD on Sat, already consutled IR for HD cath placement on Monday 5/16.  Allen Green, [place], PGY-2 (Currently working with [facility] - Renal Service)   12/14/2014, 8:11 AM   Patient seen and examined, agree with above note with above modifications. Has perked up some today s/p HD.  Unfortunately UOP is decreased- likely will need HD tomorrow- but then followed by temp cath removal- if still needs HD next week- have IR primed to do it on Monday.  A flutter continues to be an issue Allen Parish, MD 12/14/2014       Recent Labs Lab 12/12/14 0525 12/13/14 0505 12/14/14 0500  NA 133* 137 136  K 3.9 4.1 3.8  CL 100* 103 99*  CO2 24 24 25   GLUCOSE 243* 116* 108*  BUN 52* 72* 52*  CREATININE 5.14* 7.38* 6.34*  CALCIUM 7.5* 8.0* 7.8*  PHOS 6.2* 8.2* 5.7*    Recent Labs Lab 12/12/14 0525 12/13/14 0505 12/14/14 0500  AST  --  21 18  ALT  --  16* 15*  ALKPHOS  --  94 88  BILITOT  --  0.7 0.8  PROT  --  5.7* 6.0*  ALBUMIN 1.7* 1.9*  1.9* 1.9*    Recent Labs Lab 12/09/14 0517 12/11/14 0558 12/13/14 0505 12/13/14 1205  WBC 18.0* 15.7* 20.1* 19.3*  NEUTROABS 14.8*  --   --   --   HGB 8.8* 9.4* 9.0* 9.1*  HCT 26.4* 28.5* 27.9* 28.4*  MCV 90.4 90.5 93.0 94.0  PLT 341 324 262 259   . amiodarone  400 mg Oral Daily  . antiseptic oral rinse  7 mL Mouth Rinse BID  . arformoterol  15 mcg Nebulization BID  . budesonide (PULMICORT) nebulizer solution  0.5 mg Nebulization BID  . darbepoetin (ARANESP) injection - NON-DIALYSIS  100 mcg Subcutaneous Q Mon-1800  . diltiazem  10 mg Intravenous Once  . famotidine  20 mg Oral Daily  . feeding supplement (NEPRO CARB STEADY)  237 mL Oral BID BM  . feeding supplement (RESOURCE BREEZE)  1 Container Oral Q1500  . furosemide  120 mg Intravenous 3 times per day  . heparin subcutaneous  5,000 Units Subcutaneous 3 times per day  . ipratropium  0.5 mg Nebulization QID  . levalbuterol  0.63 mg Nebulization QID  . megestrol  800 mg Oral Daily  . metoprolol tartrate  25 mg Oral BID  . midodrine  5 mg Oral TID WC  . nystatin  5 mL Oral QID   . diltiazem (CARDIZEM) infusion 2.5 mg/hr  (12/14/14 0700)   sodium chloride, sodium chloride, acetaminophen **OR** acetaminophen, docusate, feeding supplement (NEPRO CARB STEADY), fentaNYL (SUBLIMAZE) injection, heparin, lidocaine (PF), lidocaine-prilocaine, ondansetron **OR** ondansetron (ZOFRAN) IV, pentafluoroprop-tetrafluoroeth, promethazine, RESOURCE THICKENUP CLEAR, sodium chloride, traZODone

## 2014-12-14 NOTE — Progress Notes (Signed)
  Echocardiogram 2D Echocardiogram has been performed.  Allen Green 12/14/2014, 11:30 AM

## 2014-12-14 NOTE — Progress Notes (Signed)
Patient Name: Allen Green Date of Encounter: 12/14/2014   SUBJECTIVE  No complains. Denies CP or palpitation. Feeling better after dialysis yesterday.  Breathing better. Anticipated next HD 5/14 and tunneled HD cath 5/16.  CURRENT MEDS . amiodarone  400 mg Oral Daily  . antiseptic oral rinse  7 mL Mouth Rinse BID  . arformoterol  15 mcg Nebulization BID  . budesonide (PULMICORT) nebulizer solution  0.5 mg Nebulization BID  . darbepoetin (ARANESP) injection - NON-DIALYSIS  100 mcg Subcutaneous Q Mon-1800  . diltiazem  10 mg Intravenous Once  . famotidine  20 mg Oral Daily  . feeding supplement (NEPRO CARB STEADY)  237 mL Oral BID BM  . feeding supplement (RESOURCE BREEZE)  1 Container Oral Q1500  . furosemide  120 mg Intravenous 3 times per day  . heparin subcutaneous  5,000 Units Subcutaneous 3 times per day  . ipratropium  0.5 mg Nebulization QID  . levalbuterol  0.63 mg Nebulization QID  . megestrol  800 mg Oral Daily  . metoprolol tartrate  25 mg Oral BID  . midodrine  5 mg Oral TID WC  . nystatin  5 mL Oral QID    OBJECTIVE  Filed Vitals:   12/14/14 0900 12/14/14 0930 12/14/14 1000 12/14/14 1006  BP: 109/66  114/64   Pulse: 59   144  Temp:      TempSrc:      Resp: 25  30   Height:      Weight:      SpO2: 78% 100%      Intake/Output Summary (Last 24 hours) at 12/14/14 1208 Last data filed at 12/14/14 0801  Gross per 24 hour  Intake 134.54 ml  Output   1250 ml  Net -1115.46 ml   Filed Weights   12/11/14 1310 12/13/14 1325 12/13/14 1717  Weight: 254 lb 3.1 oz (115.3 kg) 255 lb 11.7 oz (116 kg) 253 lb 4.9 oz (114.9 kg)    PHYSICAL EXAM  General: Chronically ill-appearing, leasant, NAD. Neuro: Alert and oriented X 3. Moves all extremities spontaneously. Psych: Responds to questions appropriately with a normal affect. HEENT:  Normal  Neck: JVP not elevated. Lungs:  Resp regular and unlabored. O2 is in  placed. Improved air movement. Bibasilar crackles.    Heart: Irregular rhythy with tachycardia. distant heart sounds. S1 S2 without murmurs, rubs, or gallops. . Abdomen: Soft, non-tender, non-distended, BS + x 4.  Extremities: No clubbing, cyanosis or edema. DP/PT/Radials 2+ and equal bilaterally. Compression stocking in place.   Accessory Clinical Findings  CBC  Recent Labs  12/13/14 0505 12/13/14 1205  WBC 20.1* 19.3*  HGB 9.0* 9.1*  HCT 27.9* 28.4*  MCV 93.0 94.0  PLT 262 789   Basic Metabolic Panel  Recent Labs  12/13/14 0505 12/13/14 1205 12/14/14 0500  NA 137  --  136  K 4.1  --  3.8  CL 103  --  99*  CO2 24  --  25  GLUCOSE 116*  --  108*  BUN 72*  --  52*  CREATININE 7.38*  --  6.34*  CALCIUM 8.0*  --  7.8*  MG  --  2.2 2.0  PHOS 8.2*  --  5.7*   Liver Function Tests  Recent Labs  12/13/14 0505 12/14/14 0500  AST 21 18  ALT 16* 15*  ALKPHOS 94 88  BILITOT 0.7 0.8  PROT 5.7* 6.0*  ALBUMIN 1.9*  1.9* 1.9*   Cardiac Enzymes  Recent Labs  12/11/14 1940 12/11/14 2330 12/12/14 0525  TROPONINI <0.03 <0.03 <0.03   Thyroid Function Tests  Recent Labs  12/12/14 1300  TSH 3.037    TELE  A. Flutter with rate 110-120s - occasional spike to 140s.  ASSESSMENT AND PLAN  60M with COPD, tobacco abuse, Chronic pain, GERD, polycythemia, hepatitis B, poor quality echo 11/2014, nonischemic nuc 2012 who presented 11/24/2014 with septic shock and acute respiratory failure secondary to cholecystitis and abscess from a dislodged gallbladder drain.Also developed AKI requiring CRRT. GB drain placed by IR 4/26. Noted to have new onset atrial flutter this admission.  1. Atrial flutter with RVR 2. Septic shock with bacteroides related to gallbladder disease 3. Acute renal failure due to AKI/ATN, requiring HD this admission 4. Chronic polycythemia, now anemic 5. Profound deconditioning 6. Hypoalbuminemia 1.7  --Atrial flutter likely driven by underlying clinical picture. Started on IV amio -> now on 400mg  daily.  Interim required additional IV amio/IV diltiazem. Rates in 110-120s. Pt did not received both dose of metoprolol yesterday. Likely contributing to high rate. Received AM dose this morning. Can consider changing metoprolol to Bystolic given COPD/wheezing. Continue current dose of amio.  - -May also be able to change IV diltiazem to oral soon - but want to see what EF is in case we have to shift to a BB-only strategy. --Echo pending -- CHADSVASC = 2.  Hemoccult positive for blood. Hemoglobin 9.1 today, likely stable. Currently on DVT ppx heparin only. Further management per internal medicine. Will hold on anticoagulation with coumadin until OK with internal medicine. Not a candidate for NOACs acutely with fluctuating renal function. May try to convert him back to sinus rhythm, once he is improved and if he completes 4 weeks of anticoagulation.  Signed, Bhagat,Bhavinkumar PA-C   I have examined the patient and reviewed assessment and plan and discussed with patient.  Agree with above as stated.  Rate was controlled but then rate control meds have been interrupted due to hypotension in the last 24 hours.  Low BP started with dialysis. Continue to follow on telemetry. For now, will increase diltiazem to 5 mg/hr. Diastolic is fairly low. Patient with multiple organ systems involved in his illness.    Ghadeer Kastelic S.

## 2014-12-14 NOTE — Progress Notes (Signed)
OT Cancellation Note  Patient Details Name: DUFF POZZI MRN: [id number] DOB: August 29, 1950   Cancelled Treatment:    Reason Eval/Treat Not Completed: Patient at procedure or test/ unavailable. Pt in ECHO, OT will re-attempt later today as available to complete evaluation.   Villa Herb M   Cyndie Chime, OTR/L Occupational Therapist [phone] (pager)  12/14/2014, 11:27 AM

## 2014-12-15 DIAGNOSIS — I4891 Unspecified atrial fibrillation: Secondary | ICD-10-CM

## 2014-12-15 LAB — CBC
HEMATOCRIT: 24.9 % — AB (ref 39.0–52.0)
Hemoglobin: 8.2 g/dL — ABNORMAL LOW (ref 13.0–17.0)
MCH: 30 pg (ref 26.0–34.0)
MCHC: 32.9 g/dL (ref 30.0–36.0)
MCV: 91.2 fL (ref 78.0–100.0)
Platelets: 141 10*3/uL — ABNORMAL LOW (ref 150–400)
RBC: 2.73 MIL/uL — ABNORMAL LOW (ref 4.22–5.81)
RDW: 15.6 % — ABNORMAL HIGH (ref 11.5–15.5)
WBC: 26.3 10*3/uL — AB (ref 4.0–10.5)

## 2014-12-15 LAB — RENAL FUNCTION PANEL
ANION GAP: 12 (ref 5–15)
Albumin: 1.8 g/dL — ABNORMAL LOW (ref 3.5–5.0)
BUN: 64 mg/dL — ABNORMAL HIGH (ref 6–20)
CHLORIDE: 98 mmol/L — AB (ref 101–111)
CO2: 23 mmol/L (ref 22–32)
CREATININE: 8.04 mg/dL — AB (ref 0.61–1.24)
Calcium: 7.9 mg/dL — ABNORMAL LOW (ref 8.9–10.3)
GFR calc Af Amer: 7 mL/min — ABNORMAL LOW (ref >60)
GFR calc non Af Amer: 6 mL/min — ABNORMAL LOW (ref >60)
GLUCOSE: 107 mg/dL — AB (ref 65–99)
POTASSIUM: 3.8 mmol/L (ref 3.5–5.1)
Phosphorus: 5.8 mg/dL — ABNORMAL HIGH (ref 2.5–4.6)
SODIUM: 133 mmol/L — AB (ref 135–145)

## 2014-12-15 MED ORDER — ALTEPLASE 2 MG IJ SOLR
4.0000 mg | INTRAMUSCULAR | Status: AC
  Administered 2014-12-15: 4 mg
  Filled 2014-12-15: qty 4

## 2014-12-15 MED ORDER — HEPARIN SODIUM (PORCINE) 1000 UNIT/ML DIALYSIS: 20.0000 [IU]/kg | INTRAMUSCULAR | Status: DC | PRN

## 2014-12-15 MED ORDER — MIDODRINE HCL 5 MG PO TABS
10.0000 mg | ORAL_TABLET | Freq: Three times a day (TID) | ORAL | Status: DC
  Administered 2014-12-16 – 2014-12-17 (×4): 10 mg via ORAL
  Administered 2014-12-17: 17:00:00 via ORAL
  Administered 2014-12-17 – 2014-12-19 (×4): 10 mg via ORAL
  Filled 2014-12-15 (×11): qty 2

## 2014-12-15 NOTE — Procedures (Signed)
Patient was seen on dialysis and the procedure was supervised.  BFR 325  Via vascath BP is  108/53.   Patient appears to be tolerating treatment well- catheter had tio be tpad but seems to be working now- to be removed after HD  Jaxiel Kines A 12/15/2014

## 2014-12-15 NOTE — Progress Notes (Signed)
UR COMPLETED  

## 2014-12-15 NOTE — Progress Notes (Signed)
Pt received 45 minutes of HD. Catheter would not function. MD aware.  Alert, vss, report called to primary RN. Patient returned safely to room.

## 2014-12-15 NOTE — Progress Notes (Signed)
[facility] TEAM 1 - Stepdown/ICU TEAM Progress Note  Allen Green HUD:[id number] DOB: [birth year] DOA: 11/14/2014 PCP: Linton Rump, PA  Admit HPI / Brief Narrative: 64 y/o male admitted on 4/26 with septic shock and acute respiratory failure due to cholecystitis and an abscess associated with a recently dislodged gallbladder drain.  Course complicated by bibasilar HCAP with severe hypoxemia and severe autoPEEP.  Significant Events: 4/26 admitted - intubated, gallbladder drain placed by IR 4/29 multiple amio boluses for RVR 4/30 worsening hypoxia 5/3 improving pressor needs 5/4 SBTs 5/5 extubated 5/6 off CVVHD 5/8 transfer to [facility] for intermittent HD 5/11 [facility] assumes care  HPI/Subjective: The patient states he did not sleep last night.  He appears lethargic today but does remain awake for my exam and is able to answer questions appropriately.  He denies shortness of breath fevers chills nausea vomiting or abdominal pain.  He admits to having a very poor appetite.  Assessment/Plan:  Acute respiratory failure with hypercapnea Resolved  Septic shock due to Bacteroides Resolved - due to gallbladder infection - antibiotic treatment course completed  Acute Cholecystis with peri-gallbladder abscess due to dislodged percutaneous drain  s/p drain 4/26 per IR - prior drain 4/3 > 4/12  Acute kidney failure requiring dialysis Due to sepsis and contrast exposure - Nephrology following - urine output picking up - Nephrology planning to place a tunnelled dialysis catheter Monday therefore will hold Coumadin therapy   Atrial flutter w/ RVR CHADSVASC = 2 - care per Cardiology - rate control has been a challenge in the setting of hypotension - additionally IV access has been difficult to obtain - initiate Coumadin once permanent dialysis catheter placed - continue oral Cardizem as blood pressure allows  Very difficult vascular access Multiple attempts been made by nursing in the IV team and a  peripheral IV has not been obtained - the patient is not a PICC candidate due to his renal failure - likewise I wish to avoid a central venous line as the patient will require a tunneled dialysis catheter which is scheduled to be placed within 36 hours - we will attempt to get by without peripheral access once his temporary dialysis catheter has to be removed - if an emergent situation arises we will have no choice but to ask critical care to emergently place a central venous line  Severe COPD Tobacco abuse  Counseled on absolute need to discontinue tobacco abuse permanently  Ileus Clinically resolved  BLL HCAP Clinically resolved  Chronic polycythemia due to COPD/hypoxia - now anemic Due to critical illness and acute kidney failure - no IV iron due to bacteremia - erythropoietin per nephrology - hemoglobin slowly trending downward - follow trend  Thrush Treatment initiated  Profound deconditioned state For eventual CIR placement  Hepatitis B core antibody positive This isolated positive result is unclear and could represent a resolved infection, false positive test, or even a chronic low-grade infection - will need to be followed as outpatient - follow-up LFTs in a.m.  Code Status: FULL Family Communication: Spoke with patient and his family at bedside Disposition Plan: SDU until heart rate better controlled  Consultants: [facility] Cardiology Nephrology Pulmonary  Procedures: 4/26 TTE > poor study, can't comment on LV or RV size/function  Antibiotics: None  DVT prophylaxis: Subcutaneous heparin  Objective: Blood pressure 93/49, pulse 87, temperature 98.7 F (37.1 C), temperature source Oral, resp. rate 20, height 5\' 11"  (1.803 m), weight 114.1 kg (251 lb 8.7 oz), SpO2 96 %.  Intake/Output Summary (  Last 24 hours) at 12/15/14 1534 Last data filed at 12/15/14 1300  Gross per 24 hour  Intake    391 ml  Output    623 ml  Net   -232 ml   Exam: General: No acute respiratory  distress - somnolent but conversant Lungs: Mild bibasilar crackles without wheeze Cardiovascular: Irregularly irregular with rate approximately 125 bpm without appreciable murmur Abdomen: Nontender, nondistended, soft, bowel sounds positive, no rebound, no ascites, no appreciable mass Extremities: No significant cyanosis, clubbing;  1+ edema bilateral lower extremities  Data Reviewed: Basic Metabolic Panel:  Recent Labs Lab 12/11/14 0558 12/12/14 0525 12/13/14 0505 12/13/14 1205 12/14/14 0500 12/15/14 0458  NA 137 133* 137  --  136 133*  K 3.7 3.9 4.1  --  3.8 3.8  CL 102 100* 103  --  99* 98*  CO2 21* 24 24  --  25 23  GLUCOSE 95 243* 116*  --  108* 107*  BUN 90* 52* 72*  --  52* 64*  CREATININE 6.70* 5.14* 7.38*  --  6.34* 8.04*  CALCIUM 8.1* 7.5* 8.0*  --  7.8* 7.9*  MG  --   --   --  2.2 2.0  --   PHOS 6.5* 6.2* 8.2*  --  5.7* 5.8*    CBC:  Recent Labs Lab 12/09/14 0517 12/11/14 0558 12/13/14 0505 12/13/14 1205 12/15/14 0500  WBC 18.0* 15.7* 20.1* 19.3* 26.3*  NEUTROABS 14.8*  --   --   --   --   HGB 8.8* 9.4* 9.0* 9.1* 8.2*  HCT 26.4* 28.5* 27.9* 28.4* 24.9*  MCV 90.4 90.5 93.0 94.0 91.2  PLT 341 324 262 259 141*    Liver Function Tests:  Recent Labs Lab 12/11/14 0558 12/12/14 0525 12/13/14 0505 12/14/14 0500 12/15/14 0458  AST  --   --  21 18  --   ALT  --   --  16* 15*  --   ALKPHOS  --   --  94 88  --   BILITOT  --   --  0.7 0.8  --   PROT  --   --  5.7* 6.0*  --   ALBUMIN 1.8* 1.7* 1.9*  1.9* 1.9* 1.8*   Cardiac Enzymes:  Recent Labs Lab 12/11/14 1940 12/11/14 2330 12/12/14 0525  TROPONINI <0.03 <0.03 <0.03    CBG:  Recent Labs Lab 12/09/14 0822 12/09/14 1245 12/09/14 1644 12/09/14 1911 12/09/14 2335  GLUCAP 93 109* 84 82 96    Recent Results (from the past 240 hour(s))  Clostridium Difficile by PCR     Status: None   Collection Time: 12/08/14  5:15 AM  Result Value Ref Range Status   C difficile by pcr NEGATIVE  NEGATIVE Final  Clostridium Difficile by PCR     Status: None   Collection Time: 12/12/14 12:35 AM  Result Value Ref Range Status   C difficile by pcr NEGATIVE NEGATIVE Final  Culture, Urine     Status: None   Collection Time: 12/13/14 11:12 AM  Result Value Ref Range Status   Specimen Description URINE, CATHETERIZED  Final   Special Requests NONE  Final   Colony Count NO GROWTH Performed at [facility]   Final   Culture NO GROWTH Performed at [facility]   Final   Report Status 12/14/2014 FINAL  Final  Culture, blood (routine x 2)     Status: None (Preliminary result)   Collection Time: 12/13/14 11:55 AM  Result Value  Ref Range Status   Specimen Description BLOOD RIGHT HAND  Final   Special Requests AEB 3CC  Final   Culture   Final           BLOOD CULTURE RECEIVED NO GROWTH TO DATE CULTURE WILL BE HELD FOR 5 DAYS BEFORE ISSUING A FINAL NEGATIVE REPORT Performed at [facility]    Report Status PENDING  Incomplete  Culture, blood (routine x 2)     Status: None (Preliminary result)   Collection Time: 12/13/14 12:05 PM  Result Value Ref Range Status   Specimen Description BLOOD LEFT ARM  Final   Special Requests BAA 10CC  Final   Culture   Final           BLOOD CULTURE RECEIVED NO GROWTH TO DATE CULTURE WILL BE HELD FOR 5 DAYS BEFORE ISSUING A FINAL NEGATIVE REPORT Performed at [facility]    Report Status PENDING  Incomplete  Body fluid culture     Status: None (Preliminary result)   Collection Time: 12/13/14 11:05 PM  Result Value Ref Range Status   Specimen Description FLUID  Final   Special Requests GALL BLADDER  Final   Gram Stain   Final    NO WBC SEEN NO ORGANISMS SEEN Performed at [facility]    Culture   Final    Culture reincubated for better growth Performed at [facility]    Report Status PENDING  Incomplete     Studies:   Recent x-ray studies have been reviewed in detail by the Attending  Physician  Scheduled Meds:  Scheduled Meds: . amiodarone  400 mg Oral Daily  . antiseptic oral rinse  7 mL Mouth Rinse BID  . arformoterol  15 mcg Nebulization BID  . budesonide (PULMICORT) nebulizer solution  0.5 mg Nebulization BID  . darbepoetin (ARANESP) injection - NON-DIALYSIS  100 mcg Subcutaneous Q Mon-1800  . diltiazem  30 mg Oral 4 times per day  . famotidine  20 mg Oral Daily  . feeding supplement (NEPRO CARB STEADY)  237 mL Oral BID BM  . feeding supplement (RESOURCE BREEZE)  1 Container Oral Q1500  . furosemide  120 mg Intravenous 3 times per day  . heparin subcutaneous  5,000 Units Subcutaneous 3 times per day  . ipratropium  0.5 mg Nebulization QID  . levalbuterol  0.63 mg Nebulization QID  . megestrol  800 mg Oral Daily  . metoprolol tartrate  25 mg Oral BID  . midodrine  5 mg Oral TID WC  . nystatin  5 mL Oral QID    Time spent on care of this patient: 35 mins   MCCLUNG,JEFFREY T , MD   [facility] Office  [phone] Pager - Text Page per Shea Evans as per below:  On-Call/Text Page:      [email or web address]      password [email or web address]  If 7PM-7AM, please contact night-coverage [email or web address] Password [facility] 12/15/2014, 3:34 PM   LOS: 18 days

## 2014-12-15 NOTE — Progress Notes (Signed)
Catheter will not function. Arterial pressure too high for HD tx. MD notified. Cath flo ordered.

## 2014-12-15 NOTE — Progress Notes (Signed)
Cathflo dwelling for 30 minutes in catheter

## 2014-12-15 NOTE — Progress Notes (Signed)
[facility] Progress Note   Subjective:  Bradycardic overnight without symptoms.  Last HD Thurs 5/12, 1100cc removed. Made over 500 of urine- continues to look better daily- seen in HD- had to TPA temp cath- now running  . amiodarone  400 mg Oral Daily  . antiseptic oral rinse  7 mL Mouth Rinse BID  . arformoterol  15 mcg Nebulization BID  . budesonide (PULMICORT) nebulizer solution  0.5 mg Nebulization BID  . darbepoetin (ARANESP) injection - NON-DIALYSIS  100 mcg Subcutaneous Q Mon-1800  . diltiazem  30 mg Oral 4 times per day  . famotidine  20 mg Oral Daily  . feeding supplement (NEPRO CARB STEADY)  237 mL Oral BID BM  . feeding supplement (RESOURCE BREEZE)  1 Container Oral Q1500  . furosemide  120 mg Intravenous 3 times per day  . heparin subcutaneous  5,000 Units Subcutaneous 3 times per day  . ipratropium  0.5 mg Nebulization QID  . levalbuterol  0.63 mg Nebulization QID  . megestrol  800 mg Oral Daily  . metoprolol tartrate  25 mg Oral BID  . midodrine  5 mg Oral TID WC  . nystatin  5 mL Oral QID   Objective: Temp:  [97.8 F (36.6 C)-98.3 F (36.8 C)] 98.3 F (36.8 C) (05/14 0416) Pulse Rate:  [33-144] 50 (05/14 0721) Resp:  [15-30] 17 (05/14 0721) BP: (83-139)/(38-119) 105/50 mmHg (05/14 0721) SpO2:  [78 %-100 %] 99 % (05/14 0721) Weight:  [251 lb 8.7 oz (114.1 kg)] 251 lb 8.7 oz (114.1 kg) (05/14 0416) Weight: 255 -> 253 (pre/post HD 5/12) > 251 (5/14)  Intake/Output Summary (Last 24 hours) at 12/15/14 0738 Last data filed at 12/15/14 9509  Gross per 24 hour  Intake 889.25 ml  Output    590 ml  Net 299.25 ml  (Drains comprise 26ml of output)  Exam: Gen - Obese, interactive male in no distress Neck - supple, no JVD- IJ vascath Heart - irregularly irregular, tachy, no murmurs heard Lungs - Nonlabored, improved exp wheezing. Improved air movement. 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 Had been managed since with chronic drain with changes per IR.  On 11/03/14 pt was seen by IR for the tube falling out.   There was not an indication for PCT replacement. On 4/26 patient was admitted w septic shock and gallbladder abcess. Pt was admitted to ICU. IR placed another perc drain on 4/26.  Patient developed AKI/ ATN from shock/ contrast and was treated with CRRT from 4/27-5/6 at [facility]   Assessment: 1. AKI / ATN - from sepsis / contrast. Baseline creatinine normal (0.90 to 1). CRRT 4/27-5/6 > IHD 5/8, 5/10, 5/12. Kidneys have not picked up UOP or clearing, so will need HD today through temp cath then remove cath. 1. IR to place [facility] 5/16, pre-op orders in. Recommend continue to HOLD coumadin. 2. Bacteroides sepsis/ gallbladder infection, s/p chole tube: Resolving off meropenem (5/9), leukocytosis continues - per primary 3. Volume excess: Improved with HD. no recent CXR. Wt down  to 253 (unclear EDW). BP remains low 100s, suspect will limit volume removal by HD 4. Shock: Resolved. Off pressors (5/5) 5. Anemia - multifactorial- Hgb stable, continue Aranesp 173mcg weekly (start 5/9). Not candidate for iron d/t infection. May transfuse PRBC Hgb < 7.0 6. A flutter On Amio PO 400mg  daily and diltiazem 30mg  q6h with good rate control but also hypotension. 1. Plan eventually for cardioversion; would prefer to hold anticoagulation until after IR procedure 5/16 2. Amiodarone taper per cardiology  7. Fecal Occult Positive - No chart history of prior colonoscopy. 8. Diarrhea - C.Diff negative 9. Nutrition - Poor PO.  Tolerating resource breeze. Nutrition currently following. 10. Dispo - Patient has been accepted into CIR, but transfer pending renal status. Still hopeful for recovery of renal function. Currently remains on IHD, re-evaluate daily. Ready for  Tunneled HD cath placement on Monday 5/16 if needed.  [name], MD, PGY-2 12/15/2014 7:37 AM  Patient seen and examined, agree with above note with above modifications. Prolonged AKI in the setting of cholecystitis/sepsis- been HD dependent since 4/27- nonoliguric.  Planning on doing HD today via temp cath, then to D/C temp cath- then to observe.  IR planning to do tunneled cath if needed on Monday.  [name], MD 12/15/2014     Recent Labs Lab 12/13/14 0505 12/14/14 0500 12/15/14 0458  NA 137 136 133*  K 4.1 3.8 3.8  CL 103 99* 98*  CO2 24 25 23   GLUCOSE 116* 108* 107*  BUN 72* 52* 64*  CREATININE 7.38* 6.34* 8.04*  CALCIUM 8.0* 7.8* 7.9*  PHOS 8.2* 5.7* 5.8*   Recent Labs Lab 12/13/14 0505 12/14/14 0500 12/15/14 0458  AST 21 18  --   ALT 16* 15*  --   ALKPHOS 94 88  --   BILITOT 0.7 0.8  --   PROT 5.7* 6.0*  --   ALBUMIN 1.9*  1.9* 1.9* 1.8*   Recent Labs Lab 12/09/14 0517  12/13/14 0505 12/13/14 1205 12/15/14 0500  WBC 18.0*  < > 20.1* 19.3* 26.3*  NEUTROABS 14.8*  --   --   --   --   HGB 8.8*  < > 9.0* 9.1* 8.2*  HCT 26.4*  < > 27.9* 28.4* 24.9*  MCV 90.4  < > 93.0 94.0 91.2  PLT 341  < > 262 259 141*  < > = values in this interval not displayed.

## 2014-12-15 NOTE — Progress Notes (Addendum)
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 Patient denies any chest pain or increased dyspnea. Rhythm is atrial flutter/fibrillation with rate still in 110 range. BP was soft during the night preventing uptitration of diltiazem or BB yet. CURRENT MEDS . amiodarone  400 mg Oral Daily  . antiseptic oral rinse  7 mL Mouth Rinse BID  . arformoterol  15 mcg Nebulization BID  . budesonide (PULMICORT) nebulizer solution  0.5 mg Nebulization BID  . darbepoetin (ARANESP) injection - NON-DIALYSIS  100 mcg Subcutaneous Q Mon-1800  . diltiazem  30 mg Oral 4 times per day  . famotidine  20 mg Oral Daily  . feeding supplement (NEPRO CARB STEADY)  237 mL Oral BID BM  . feeding supplement (RESOURCE BREEZE)  1 Container Oral Q1500  . furosemide  120 mg Intravenous 3 times per day  . heparin subcutaneous  5,000 Units Subcutaneous 3 times per day  . ipratropium  0.5 mg Nebulization QID  . levalbuterol  0.63 mg Nebulization QID  . megestrol  800 mg Oral Daily  . metoprolol tartrate  25 mg Oral BID  . midodrine  5 mg Oral TID WC  . nystatin  5 mL Oral QID    OBJECTIVE  Filed Vitals:   12/15/14 1030 12/15/14 1045 12/15/14 1121 12/15/14 1134    BP: 117/66 104/61 113/60   Pulse: 122 119 83   Temp:  97.8 F (36.6 C)    TempSrc:  Oral    Resp: 23 18 19    Height:      Weight:      SpO2:  96% 97% 100%    Intake/Output Summary (Last 24 hours) at 12/15/14 1138 Last data filed at 12/15/14 1045  Gross per 24 hour  Intake 524.75 ml  Output    638 ml  Net -113.25 ml   Filed Weights   12/13/14 1717 12/15/14 0416 12/15/14 0822  Weight: 253 lb 4.9 oz (114.9 kg) 251 lb 8.7 oz (114.1 kg) 251 lb 8.7 oz (114.1 kg)    PHYSICAL EXAM  General: Pleasant, NAD. Looks older than stated age. Neuro: Alert and oriented X 3. Moves all extremities spontaneously. Psych: Normal affect. HEENT:  Normal  Neck: Supple without bruits or JVD. Lungs:  Resp regular and unlabored, CTA. Heart: Irregularly irregular. no s3, s4, or murmurs. Abdomen: Soft, non-tender, non-distended, BS + x 4.  Extremities: No clubbing, cyanosis or edema. DP/PT/Radials 2+ and equal bilaterally.  Accessory Clinical Findings  CBC  Recent Labs  12/13/14 1205 12/15/14 0500  WBC 19.3* 26.3*  HGB 9.1* 8.2*  HCT 28.4* 24.9*  MCV 94.0 91.2  PLT 259 141*  Basic Metabolic Panel  Recent Labs  12/13/14 1205 12/14/14 0500 12/15/14 0458  NA  --  136 133*  K  --  3.8 3.8  CL  --  99* 98*  CO2  --  25 23  GLUCOSE  --  108* 107*  BUN  --  52* 64*  CREATININE  --  6.34* 8.04*  CALCIUM  --  7.8* 7.9*  MG 2.2 2.0  --   PHOS  --  5.7* 5.8*   Liver Function Tests  Recent Labs  12/13/14 0505 12/14/14 0500 12/15/14 0458  AST 21 18  --   ALT 16* 15*  --   ALKPHOS 94 88  --   BILITOT 0.7 0.8  --   PROT 5.7* 6.0*  --   ALBUMIN 1.9*  1.9* 1.9* 1.8*   No results for input(s): LIPASE, AMYLASE in the last 72 hours. Cardiac Enzymes No results for input(s): CKTOTAL, CKMB, CKMBINDEX, TROPONINI in the last 72 hours. BNP Invalid input(s): POCBNP D-Dimer No results for input(s): DDIMER in the last 72 hours. Hemoglobin A1C No results for input(s): HGBA1C in the  last 72 hours. Fasting Lipid Panel No results for input(s): CHOL, HDL, LDLCALC, TRIG, CHOLHDL, LDLDIRECT in the last 72 hours. Thyroid Function Tests  Recent Labs  12/12/14 1300  TSH 3.037    TELE  Atrial fibrillation with RVR  ECG    Radiology/Studies  Ct Abdomen Pelvis Wo Contrast  11/30/2014   CLINICAL DATA:  Abdominal distention, vomiting.  EXAM: CT ABDOMEN AND PELVIS WITHOUT CONTRAST  TECHNIQUE: Multidetector CT imaging of the abdomen and pelvis was performed following the standard protocol without IV contrast.  COMPARISON:  01/27/2015 contrast study  FINDINGS: Lower chest: Emphysematous changes noted in the lung bases. Trace bilateral pleural effusions, new since prior study. Bibasilar atelectasis. Heart is normal size. Densely calcified coronary arteries.  Hepatobiliary: Cholecystostomy tube has been placed into the gallbladder. Gallbladder is decompressed. Small amount of perihepatic ascites, new since prior study. No focal hepatic abnormality visualized on this unenhanced study.  Pancreas: No focal abnormality or ductal dilatation.  Spleen: No focal abnormality.  Normal size.  Adrenals/Urinary Tract: No focal renal or adrenal abnormality. No hydronephrosis. Urinary bladder is decompressed with Foley catheter in place.  Stomach/Bowel: NG tube present in the stomach which is unremarkable. Mildly prominent abdominal small bowel loops within the mid jejunum. Questionable mild wall thickening in these mildly prominent small bowel loops, best seen on images 66-75 of series 2. Transition to normal caliber small bowel noted on image 66. Appendix is visualized and unremarkable.  Vascular/Lymphatic: Aortic and iliac calcifications. No aneurysm. No retroperitoneal or mesenteric adenopathy.  Reproductive: No mass or other significant abnormality.  Other: Trace free fluid in the pelvis and adjacent to the liver.  Musculoskeletal: No focal bone lesion or acute bony abnormality.  IMPRESSION: Mildly  prominent central small bowel loops in the lower abdomen and upper pelvis, new since prior study. There is questionable wall thickening within these loops suggesting the possibility of enteritis. This alternatively could represent early partial small bowel obstruction, but no visible abnormality at the transition point.  Interval placement of cholecystostomy tube with decompressed gallbladder.  New trace bilateral pleural effusions and trace free fluid in the abdomen and pelvis.  Coronary artery disease, COPD.   Electronically Signed   By: Rolm Baptise M.D.   On: 11/30/2014 12:48   Dg Chest 2 View  11/19/2014   CLINICAL DATA:  Abdominal pain, COPD, difficulty breathing.  EXAM:  CHEST  2 VIEW  COMPARISON:  11/11/2014 CT  FINDINGS: COPD. Bibasilar opacities, increased on the left. Mild pulmonary arterial prominence. Calcified right hilar nodes. No overt effusion. No pneumothorax. Osteopenia and multilevel degenerative change.  IMPRESSION: COPD. Pulmonary arterial prominence may reflect pulmonary arterial hypertension.  Increased left lung base opacity may reflect atelectasis or developing pneumonia.   Electronically Signed   By: Carlos Levering M.D.   On: 11/19/2014 04:55   US Abdomen Complete  11/19/2014   CLINICAL DATA:  Right upper quadrant pain.  EXAM: ULTRASOUND ABDOMEN COMPLETE  COMPARISON:  None.  FINDINGS: Gallbladder: Gallstones are present. Gallbladder wall is upper normal to mildly thickened at 3 mm. Negative sonographic Murphy sign.  Common bile duct: Diameter: Dilated at 8 mm proximally. The mid/distal duct is obscured by bowel gas artifact.  Liver: Increased/ heterogeneous in echogenicity. Focal lesion detection is limited in this setting.  IVC: No abnormality visualized.  Pancreas: Poorly visualized/nondiagnostic.  Spleen: The spleen has a normal sonographic appearance passed, measuring 10.7 cm.  Right Kidney: Length: Our 12.7 cm. Echogenicity within normal limits. No mass or hydronephrosis  visualized.  Left Kidney: Length: 12.6 cm. Echogenicity within normal limits. No mass or hydronephrosis visualized.  Abdominal aorta: No aneurysm visualized.  Other findings: None.  IMPRESSION: Cholelithiasis and mild gallbladder wall thickening. No sonographic Murphy sign to favor acute cholecystitis. Correlate with HIDA scan if clinical concern persists.  Mild CBD prominence at 8 mm proximally. The mid/ distal duct is obscured by bowel gas artifact. Correlate with LFTs and ERCP or MRCP if warranted.  Pancreas obscured.  Hepatic steatosis.   Electronically Signed   By: Carlos Levering M.D.   On: 11/19/2014 05:14   Ct Abdomen Pelvis W Contrast  11/27/2014   CLINICAL DATA:  64 year old male with history of cholecystitis status post placement of percutaneous cholecystostomy tube. The tube was subsequently displaced. Patient having recurrent pain at the tube insertion site.  EXAM: CT ABDOMEN AND PELVIS WITH CONTRAST  TECHNIQUE: Multidetector CT imaging of the abdomen and pelvis was performed using the standard protocol following bolus administration of intravenous contrast.  CONTRAST:  8mL OMNIPAQUE IOHEXOL 300 MG/ML SOLN, 150mL OMNIPAQUE IOHEXOL 300 MG/ML SOLN  COMPARISON:  Most recent prior CT abdomen/ pelvis 11/19/2014  FINDINGS: Lower Chest: Centrilobular emphysema. Mild lower lobe atelectasis. Heart is within normal limits for size. No pericardial effusion. Small hiatal hernia.  Abdomen: Unremarkable CT appearance of the stomach, duodenum, spleen, adrenal glands and pancreas. Normal hepatic contour and morphology. Mild gallbladder distention. There is slight hyper enhancement and thickening of the gallbladder wall with mild inflammatory stranding in the pericholecystic fat. Additionally, there is increased prominence of the soft tissue tract at the site of the prior gallbladder drain. There is small volume fluid tracking along the tracks with increased soft tissue thickening and inflammatory stranding.   Unremarkable appearance of the bilateral kidneys. No focal solid lesion, hydronephrosis or nephrolithiasis. 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 No evidence of obstruction or focal bowel wall thickening. Normal appendix in the right lower quadrant. The terminal ileum is unremarkable.  Pelvis: Unremarkable bladder, prostate gland and seminal vesicles. No free fluid or suspicious adenopathy.  Bones/Soft Tissues: No acute fracture or aggressive appearing lytic or blastic osseous lesion. Chronic bilateral L5 pars defects. No associated anterolisthesis.  Vascular: Atherosclerotic vascular disease without significant stenosis or aneurysmal dilatation.  IMPRESSION: 1. Developing recurrent gallbladder distention, wall thickening and hyper enhancement concerning for recurrent acute cholecystitis. 2. There is also  progressive inflammatory stranding along the tract from the prior percutaneous cholecystostomy tube. The tract contains a small volume of fluid and communicates directly with the gallbladder lumen via a short transhepatic course. This is concerning for bile leaking into the tract. Query clinical history of bile leakage from the skin insertion site? Alternately, this could represent an abscess developing within the prior tube tract with secondary inflammatory stranding of the gallbladder. This is considered less likely. Nuclear medicine HIDA scan could further evaluate to confirm recurrent obstruction of the cystic duct.   Electronically Signed   By: Jacqulynn Cadet M.D.   On: 11/27/2014 01:05   Ct Abdomen Pelvis W Contrast  11/19/2014   CLINICAL DATA:  Upper abdominal pain.  Nausea and vomiting.  EXAM: CT ABDOMEN AND PELVIS WITH CONTRAST  TECHNIQUE: Multidetector CT imaging of the abdomen and pelvis was performed using the standard protocol following bolus administration of intravenous contrast.  CONTRAST:  116mL OMNIPAQUE IOHEXOL 300 MG/ML  SOLN  COMPARISON:  CT scan dated  11/11/2014  FINDINGS: Tiny hiatal hernia. Cholecystostomy tube has been removed. Slight thickening of the gallbladder wall which is felt to be chronic. Biliary tree is otherwise normal. Liver, spleen, pancreas, adrenal glands, and kidneys are normal. The bowel appears normal except for a few diverticula in the descending colon. Bladder and prostate gland appear normal.  Extensive atheromatous disease in the abdominal aorta.  No adenopathy. No acute osseous abnormality. Bilateral pars defects at L5 with grade 1 spondylolisthesis.  IMPRESSION: No acute abnormalities. Cholecystostomy tube has been removed. Slight chronic thickening of the gallbladder wall.   Electronically Signed   By: Lorriane Shire M.D.   On: 11/19/2014 07:21   US Renal  11/28/2014   CLINICAL DATA:  64 year old male with renal failure  EXAM: RENAL / URINARY TRACT ULTRASOUND COMPLETE  COMPARISON:  Prior CT abdomen/pelvis 11/27/2014  FINDINGS: Right Kidney:  Length: 11.7 cm. Echogenicity within normal limits. No mass or hydronephrosis visualized.  Left Kidney:  Length: 14 cm. Echogenicity within normal limits. No mass or hydronephrosis visualized.  Bladder:  Appears normal for degree of bladder distention.  Other: Incidental note is made of small volume perihepatic ascites. The liver is diffusely heterogeneous in appearance.  IMPRESSION: 1. No evidence of hydronephrosis or other renal abnormality. 2. Incidental note is made of small volume perihepatic ascites.   Electronically Signed   By: Jacqulynn Cadet M.D.   On: 11/28/2014 21:05   Ir Perc Cholecystostomy  11/29/2014   ADDENDUM REPORT: 11/29/2014 16:16  ADDENDUM: Correction to the procedure title: PLACEMENT OF PERCUTANEOUS CHOLECYSTOSTOMY TUBE.   Electronically Signed   By: Markus Daft M.D.   On: 11/29/2014 16:16   11/29/2014   CLINICAL DATA:  64 year old with sepsis and cholecystitis. History of prior cholecystostomy tube.  EXAM: REPLACEMENT OF CHOLECYSTOSTOMY TUBE THROUGH EXISTING TRACT  WITH ULTRASOUND AND FLUOROSCOPIC GUIDANCE  Physician: Stephan Minister. Henn, MD  FLUOROSCOPY TIME:  42 seconds, 48 mGy.  CONTRAST:  5 mL Omnipaque 300  MEDICATIONS: None  ANESTHESIA/SEDATION: Moderate sedation time: None  PROCEDURE: Informed consent was obtained from the patient's wife. Patient was placed supine on the interventional table. The right upper abdomen was evaluated with ultrasound. The gallbladder was small with a small amount of fluid. A hypoechoic tract was identified from the skin to gallbladder. In addition, the skin just above this tract was red and raised. The right abdomen was prepped and draped in sterile fashion. Maximal barrier sterile technique was utilized including caps, mask, sterile gowns,  sterile gloves, sterile drape, hand hygiene and skin antiseptic. The area of redness was anesthetized with 1% lidocaine. A small incision was made and a 5 French catheter was advanced down the tract with ultrasound guidance. A large amount of bloody purulent fluid started to drained around the catheter. The area of skin fullness decompressed following this drainage. Ultrasound demonstrated that the 5 French drain was actually in the gallbladder. Small amount of contrast under fluoroscopy confirmed placement in the gallbladder. A J wire was placed and a 10 [place] multi-purpose drain was advanced over the wire. Contrast injection again confirmed placement in the gallbladder. 30 mL of thick purulent green fluid was aspirated. Sample sent for culture. Catheter was sutured to the skin and attached to gravity bag. Bandage was placed.  FINDINGS: The old drain site was healed but this area was erythematous and raised. Ultrasound demonstrated a hypoechoic tract between the old skin site and the gallbladder. After making an incision at the old drain site, purulent fluid immediately drained to the skin. A 5 French catheter easily advanced from the skin site to the gallbladder. 10 French drain was confirmed within the  gallbladder. 30 mL of purulent fluid was aspirated.  Estimated blood loss: Minimal  COMPLICATIONS: None  IMPRESSION: Successful replacement of the percutaneous cholecystostomy tube through the old drain tract.  Subcutaneous abscess along the old drain tract which was successfully decompressed during this procedure.  Electronically Signed: By: Markus Daft M.D. On: 11/27/2014 17:30   Dg Chest Port 1 View  12/13/2014   CLINICAL DATA:  64 year old male with pneumonia. Subsequent encounter.  EXAM: PORTABLE CHEST - 1 VIEW  COMPARISON:  12/07/2014.  FINDINGS: Right internal jugular catheter tip proximal superior vena cava level. Left internal jugular catheter is been removed.  No gross pneumothorax detected.  Asymmetric airspace disease most notable lung bases may reflect pulmonary edema although could not exclude superimposed infectious infiltrate within the lung bases in the proper clinical setting.  Hilar prominence stable.  Heart size top-normal.  IMPRESSION: Left internal jugular catheter has been removed with remainder of findings similar to prior exam as detailed above.   Electronically Signed   By: Genia Del M.D.   On: 12/13/2014 13:26   Dg Chest Port 1 View  12/07/2014   CLINICAL DATA:  Respiratory failure  EXAM: PORTABLE CHEST - 1 VIEW  COMPARISON:  12/06/2014  FINDINGS: There is removal of the endotracheal tube and nasogastric tube. Bilateral jugular central lines persists, reaching the SVC. There is no pneumothorax. There is persistent vascular and interstitial congestion without significant interval change. There are mild basilar opacities, unchanged.  IMPRESSION: Removal of ET and NG tubes. No other significant interval change, with persistent vascular/interstitial congestion and persistent mild basilar airspace opacities.   Electronically Signed   By: Andreas Newport M.D.   On: 12/07/2014 06:03   Dg Chest Port 1 View  12/06/2014   CLINICAL DATA:  Respiratory failure  EXAM: PORTABLE CHEST - 1 VIEW   COMPARISON:  12/05/2014  FINDINGS: The endotracheal tube is 5.3 cm above the carina. There are bilateral jugular central lines extending into the SVC. There is a nasogastric tube extending below the diaphragm and off the inferior edge of the image. There is no interval change in bibasilar airspace opacities. There is continued vascular and interstitial congestive change, without significant difference.  IMPRESSION: Support equipment appears satisfactorily positioned.  No interval difference in the moderate vascular/interstitial congestive changes or the bibasilar airspace opacities.   Electronically Signed  By: Andreas Newport M.D.   On: 12/06/2014 05:35   Dg Chest Port 1 View  12/05/2014   CLINICAL DATA:  Respiratory failure  EXAM: PORTABLE CHEST - 1 VIEW  COMPARISON:  12/04/2014  FINDINGS: The endotracheal tube is 6.2 cm above the carina. The left jugular central line extends into the SVC. There is a right jugular central line which reaches the upper SVC. There are basilar opacities bilaterally which have not changed significantly. There is no pneumothorax.  IMPRESSION: Support equipment appears satisfactorily positioned.  No significant interval change in the bilateral airspace opacities.   Electronically Signed   By: Andreas Newport M.D.   On: 12/05/2014 05:33   Dg Chest Port 1 View  12/04/2014   CLINICAL DATA:  Respiratory failure  EXAM: PORTABLE CHEST - 1 VIEW  COMPARISON:  12/03/2014  FINDINGS: The endotracheal tube tip is 7 cm above the carina. There is a right jugular central line with tip in the high SVC. There is a left jugular central line with tip in the SVC. Central and basilar airspace opacities persist without significant interval change. No pneumothorax is evident.  IMPRESSION: Support equipment appears satisfactorily positioned.  No significant interval change in the bilateral airspace opacities.   Electronically Signed   By: Andreas Newport M.D.   On: 12/04/2014 06:35   Dg Chest Port  1 View  12/03/2014   CLINICAL DATA:  Acute respiratory failure  EXAM: PORTABLE CHEST - 1 VIEW  COMPARISON:  12/02/2014  FINDINGS: The endotracheal tube extends to just below the clavicular heads. The left jugular central line extends into the upper SVC. The right jugular line extends into the upper SVC. The nasogastric tube extends into the stomach and off the inferior edge of the image.  Airspace opacities persist in both bases without significant interval change.  IMPRESSION: Support equipment appears satisfactorily positioned.  No significant interval change in the bilateral airspace opacities.   Electronically Signed   By: Andreas Newport M.D.   On: 12/03/2014 06:02   Dg Chest Port 1 View  12/02/2014   CLINICAL DATA:  Acute onset of respiratory failure. Initial encounter.  EXAM: PORTABLE CHEST - 1 VIEW  COMPARISON:  Chest radiograph performed 11/30/2014  FINDINGS: The patient's endotracheal tube is seen ending just above the carina. This could be retracted 2 cm.  A right-sided dual-lumen catheter is noted ending about the mid SVC. A left IJ line is also noted ending about the mid SVC. An enteric tube is noted extending below the diaphragm.  Vascular congestion is noted. Bibasilar airspace opacities raise concern for mild interstitial edema. Pneumonia might have a similar appearance. No definite pleural effusion or pneumothorax is seen.  The cardiomediastinal silhouette is normal in size. No acute osseous abnormalities are identified.  IMPRESSION: 1. Endotracheal tube seen ending just above the carina. This could be retracted 2 cm. 2. Vascular congestion noted. Bibasilar airspace opacities raise concern for mild interstitial edema. Pneumonia might have a similar appearance.  These results were called by telephone at the time of interpretation on 12/02/2014 at 6:39 am to [place] at the [facility], who verbally acknowledged these results.   Electronically Signed   By: Garald Balding M.D.   On:  12/02/2014 06:40   Dg Chest Port 1 View  11/30/2014   CLINICAL DATA:  Shortness of breath.  EXAM: PORTABLE CHEST - 1 VIEW  COMPARISON:  Same day.  FINDINGS: Stable cardiomediastinal silhouette endotracheal nasogastric tubes are unchanged. Right internal  jugular catheter line is unchanged. Stable bibasilar opacities are noted concerning for subsegmental atelectasis, pneumonia or edema. No pneumothorax or significant pleural effusion is noted.  IMPRESSION: Stable bibasilar opacities are noted concerning for subsegmental atelectasis, pneumonia or edema. Stable support apparatus.   Electronically Signed   By: Marijo Conception, M.D.   On: 11/30/2014 09:40   Dg Chest Port 1 View  11/30/2014   CLINICAL DATA:  Respiratory failure.  EXAM: PORTABLE CHEST - 1 VIEW  COMPARISON:  11/29/2014 .  FINDINGS: Endotracheal tube, NG tube, bilateral IJ lines in stable position. Mediastinum and hilar structures are stable. Persistent bibasilar atelectasis and/or infiltrates. No change. No prominent pleural effusion. No pneumothorax.  IMPRESSION: 1. Lines and tubes in stable position. 2. Persistent bibasilar subsegmental atelectasis and/or infiltrates.   Electronically Signed   By: Marcello Moores  Register   On: 11/30/2014 07:04   Dg Chest Port 1 View  11/29/2014   CLINICAL DATA:  Acute respiratory failure.  Pneumonia.  EXAM: PORTABLE CHEST - 1 VIEW  COMPARISON:  11/28/2014  FINDINGS: Support apparatus: Unchanged, consisting of RIGHT IJ vas cath, endotracheal tube, LEFT IJ central line and enteric tube. Monitoring leads project over the chest.  Cardiomediastinal Silhouette:  Unchanged.  Lungs: Left-greater-than-right interstitial and basilar airspace opacity. Superimposed atelectasis. Findings are more suggestive a pulmonary edema than pneumonia. No pneumothorax. When compared to yesterday's exam, aeration at the lung bases is slightly worse, with increasing atelectasis.  Effusions:  None.  Other:  None.  IMPRESSION: 1. Stable support  apparatus. 2. Increasing basilar opacity likely due to atelectasis.   Electronically Signed   By: Dereck Ligas M.D.   On: 11/29/2014 07:31   Dg Chest Port 1 View  11/28/2014   CLINICAL DATA:  Acute respiratory failure.  EXAM: PORTABLE CHEST - 1 VIEW  COMPARISON:  11/28/2014 at 0458 hours  FINDINGS: Endotracheal tube remains in place and as well above the carina. Right jugular central venous catheter has been placed and terminates over the upper SVC. Left jugular catheter remains in place, terminating over the upper SVC. Enteric tube courses into the left upper abdomen with tip not imaged. Pulmonary vascular congestion bilateral interstitial opacities overall appear slightly worse compared to the prior study. No pleural effusion or pneumothorax is identified. Cardiomediastinal silhouette is unchanged.  IMPRESSION: 1. New right jugular catheter terminates over the upper SVC. 2. Slight worsening of interstitial edema.   Electronically Signed   By: Logan Bores   On: 11/28/2014 16:20   Dg Chest Port 1 View  11/28/2014   CLINICAL DATA:  Respiratory failure, COPD, septic shock.  EXAM: PORTABLE CHEST - 1 VIEW  COMPARISON:  Portable chest x-ray of November 27, 2014  FINDINGS: The lungs are adequately inflated. The interstitial markings are coarse. There is no alveolar infiltrate. There is no pleural effusion. The cardiac silhouette is mildly enlarged. The pulmonary vascularity is mildly prominent centrally. The endotracheal tube tip projects 4.4 cm above the carina. The esophagogastric tube tip projects below the inferior margin of the image. The left internal jugular venous catheter tip projects over the proximal SVC.  IMPRESSION: Slight interval increase in pulmonary interstitial edema. There is no alveolar pneumonia nor significant pleural effusion. The support tubes are in appropriate position.   Electronically Signed   By: David  Martinique M.D.   On: 11/28/2014 07:31   Dg Chest Port 1 View  11/27/2014   CLINICAL  DATA:  Respiratory failure.  EXAM: PORTABLE CHEST - 1 VIEW  COMPARISON:  One-view  chest x-ray from the same day at 4:52 a.m.  FINDINGS: The patient is now intubated. The endotracheal tube terminates 6 cm above the carina. A left IJ line has been placed. The tip is in the mid SVC. There is no pneumothorax. An NG tube courses off the inferior border of the film.  The heart size is normal. Mild pulmonary vascular congestion is stable. Atherosclerotic calcifications are again noted within the aortic arch. The lungs are otherwise clear.  IMPRESSION: 1. Interval intubation. Satisfactory positioning of the endotracheal tube. 2. Satisfactory positioning of a new left IJ line without radiographic evidence for complication. 3. Atherosclerosis. 4. Stable pulmonary vascular congestion.   Electronically Signed   By: San Morelle M.D.   On: 11/27/2014 09:31   Dg Chest Port 1 View  11/27/2014   CLINICAL DATA:  Acute onset of shortness of breath. Initial encounter.  EXAM: PORTABLE CHEST - 1 VIEW  COMPARISON:  Chest radiograph performed 11/09/2014  FINDINGS: The lungs are well-aerated. Vascular congestion is noted, with mildly increased interstitial markings, possibly reflecting mild interstitial edema. As noted on prior CT, pneumonia could have a similar appearance. No pleural effusion or pneumothorax is seen.  The cardiomediastinal silhouette is within normal limits. No acute osseous abnormalities are seen.  IMPRESSION: Vascular congestion, with mildly increased interstitial markings, possibly reflecting mild interstitial edema. As noted on prior CT, pneumonia could have a similar appearance.   Electronically Signed   By: Garald Balding M.D.   On: 11/27/2014 06:01   Dg Chest Port 1 View  11/10/2014   CLINICAL DATA:  Patient is white states he had pneumonia with percutaneous drain that fell out. Drain was on right side of abdomen. Shortness of breath.  EXAM: PORTABLE CHEST - 1 VIEW  COMPARISON:  11/19/2014  FINDINGS:  Patient slightly rotated to the right. Lungs are adequately inflated with mild prominence of the perihilar markings suggesting mild vascular congestion. No focal lobar consolidation or effusion. Cardiomediastinal silhouette and remainder of the exam is unchanged.  IMPRESSION: Findings suggesting mild vascular congestion.   Electronically Signed   By: Marin Olp M.D.   On: 11/11/2014 22:05    ASSESSMENT AND PLAN 1. Atrial flutter with RVR. Echo shows normal LV systolic function. 2. Septic shock with bacteroides related to gallbladder disease 3. Acute renal failure due to AKI/ATN, requiring HD this admission 4. Chronic polycythemia, now anemic. Hemoccult positive stool so not on full anticoagulation. 5. Profound deconditioning 6. Hypoalbuminemia 1.7  Plan: continue amiodarone loading, continue rate control as BP allows.  Signed, Darlin Coco MD

## 2014-12-15 NOTE — Progress Notes (Signed)
Patient's lasix was stopped half way through dose for patient is going to HD within the hour. HD RN aware.

## 2014-12-16 DIAGNOSIS — R570 Cardiogenic shock: Secondary | ICD-10-CM

## 2014-12-16 LAB — RENAL FUNCTION PANEL
Albumin: 1.9 g/dL — ABNORMAL LOW (ref 3.5–5.0)
Anion gap: 16 — ABNORMAL HIGH (ref 5–15)
BUN: 63 mg/dL — AB (ref 6–20)
CHLORIDE: 98 mmol/L — AB (ref 101–111)
CO2: 22 mmol/L (ref 22–32)
Calcium: 8.1 mg/dL — ABNORMAL LOW (ref 8.9–10.3)
Creatinine, Ser: 8.23 mg/dL — ABNORMAL HIGH (ref 0.61–1.24)
GFR calc Af Amer: 7 mL/min — ABNORMAL LOW (ref >60)
GFR calc non Af Amer: 6 mL/min — ABNORMAL LOW (ref >60)
Glucose, Bld: 99 mg/dL (ref 65–99)
Phosphorus: 6.6 mg/dL — ABNORMAL HIGH (ref 2.5–4.6)
Potassium: 4.5 mmol/L (ref 3.5–5.1)
Sodium: 136 mmol/L (ref 135–145)

## 2014-12-16 LAB — CBC
HCT: 24.7 % — ABNORMAL LOW (ref 39.0–52.0)
Hemoglobin: 7.9 g/dL — ABNORMAL LOW (ref 13.0–17.0)
MCH: 29.5 pg (ref 26.0–34.0)
MCHC: 32 g/dL (ref 30.0–36.0)
MCV: 92.2 fL (ref 78.0–100.0)
PLATELETS: 152 10*3/uL (ref 150–400)
RBC: 2.68 MIL/uL — ABNORMAL LOW (ref 4.22–5.81)
RDW: 15.9 % — ABNORMAL HIGH (ref 11.5–15.5)
WBC: 24.4 10*3/uL — AB (ref 4.0–10.5)

## 2014-12-16 MED ORDER — IPRATROPIUM BROMIDE 0.02 % IN SOLN
0.5000 mg | Freq: Three times a day (TID) | RESPIRATORY_TRACT | Status: DC
  Administered 2014-12-16 – 2014-12-19 (×7): 0.5 mg via RESPIRATORY_TRACT
  Filled 2014-12-16 (×10): qty 2.5

## 2014-12-16 MED ORDER — LEVALBUTEROL HCL 0.63 MG/3ML IN NEBU
0.6300 mg | INHALATION_SOLUTION | Freq: Four times a day (QID) | RESPIRATORY_TRACT | Status: DC | PRN
  Administered 2014-12-17: 0.63 mg via RESPIRATORY_TRACT

## 2014-12-16 MED ORDER — AMIODARONE HCL 200 MG PO TABS
400.0000 mg | ORAL_TABLET | Freq: Two times a day (BID) | ORAL | Status: DC
  Administered 2014-12-16 – 2014-12-18 (×4): 400 mg via ORAL
  Filled 2014-12-16 (×5): qty 2

## 2014-12-16 MED ORDER — LEVALBUTEROL HCL 0.63 MG/3ML IN NEBU
0.6300 mg | INHALATION_SOLUTION | Freq: Three times a day (TID) | RESPIRATORY_TRACT | Status: DC
  Administered 2014-12-16 – 2014-12-19 (×7): 0.63 mg via RESPIRATORY_TRACT
  Filled 2014-12-16 (×16): qty 3

## 2014-12-16 NOTE — Progress Notes (Signed)
[facility] TEAM 1 - Stepdown/ICU TEAM Progress Note  Allen Green VEL:[id number] DOB: [birth year] DOA: 11/18/2014 PCP: Linton Rump, PA  Admit HPI / Brief Narrative: 64 y/o male admitted on 4/26 with septic shock and acute respiratory failure due to cholecystitis and an abscess associated with a recently dislodged gallbladder drain.  Course complicated by bibasilar HCAP with severe hypoxemia and severe autoPEEP.  Significant Events: 4/26 admitted - intubated, gallbladder drain placed by IR 4/29 multiple amio boluses for RVR 4/30 worsening hypoxia 5/3 improving pressor needs 5/4 SBTs 5/5 extubated 5/6 off CVVHD 5/8 transfer to [facility] for intermittent HD 5/11 [facility] assumes care  HPI/Subjective: The patient is resting comfortably today.  Assessment/Plan:  Acute respiratory failure with hypercapnea Resolved  Septic shock due to Bacteroides Resolved - due to gallbladder infection - antibiotic treatment course completed  Acute Cholecystis with peri-gallbladder abscess due to dislodged percutaneous drain  s/p drain 4/26 per IR - prior drain 4/3 > 4/12  Acute kidney failure requiring dialysis Due to sepsis and contrast exposure - Nephrology following - urine output holding an approximate 500 cc per day - Nephrology tentatively planning to place a tunnelled dialysis catheter Monday therefore will hold Coumadin therapy   Atrial flutter w/ RVR CHADSVASC = 2 - care per Cardiology - rate control has been a challenge in the setting of hypotension - additionally IV access has been difficult to obtain - initiate Coumadin once permanent dialysis catheter placed - continue oral Cardizem as blood pressure allows - amiodarone being adjusted by cardiology  Very difficult vascular access Patient currently has a left antecubital IV  Severe COPD Tobacco abuse  Counseled on absolute need to discontinue tobacco abuse permanently  Ileus Clinically resolved  BLL HCAP Clinically resolved  Chronic  polycythemia due to COPD/hypoxia - now anemic Due to critical illness and acute kidney failure - no IV iron due to bacteremia - erythropoietin per nephrology - hemoglobin slowly trending downward - follow trend  Thrush Treatment initiated  Profound deconditioned state For eventual CIR placement  Hepatitis B core antibody positive This isolated positive result is unclear and could represent a resolved infection, false positive test, or even a chronic low-grade infection - will need to be followed as outpatient - follow-up LFTs in a.m.  Code Status: FULL Family Communication: No family present at time of exam today Disposition Plan: SDU until heart rate better controlled  Consultants: [facility] Cardiology Nephrology Pulmonary  Procedures: 4/26 TTE > poor study, can't comment on LV or RV size/function  Antibiotics: None  DVT prophylaxis: Subcutaneous heparin  Objective: Blood pressure 93/49, pulse 72, temperature 98.3 F (36.8 C), temperature source Oral, resp. rate 14, height 5\' 11"  (1.803 m), weight 114.1 kg (251 lb 8.7 oz), SpO2 100 %.  Intake/Output Summary (Last 24 hours) at 12/16/14 1611 Last data filed at 12/16/14 1205  Gross per 24 hour  Intake     30 ml  Output    450 ml  Net   -420 ml   Exam: General: No acute respiratory distress Lungs: Clear to auscultation without wheeze Cardiovascular: Irregularly irregular with rate approximately 100 bpm without appreciable murmur Abdomen: Nontender, nondistended, soft, bowel sounds positive, no rebound, no ascites, no appreciable mass Extremities: No significant cyanosis, clubbing;  1+ edema bilateral lower extremities  Data Reviewed: Basic Metabolic Panel:  Recent Labs Lab 12/12/14 0525 12/13/14 0505 12/13/14 1205 12/14/14 0500 12/15/14 0458 12/16/14 0300  NA 133* 137  --  136 133* 136  K 3.9 4.1  --  3.8 3.8 4.5  CL 100* 103  --  99* 98* 98*  CO2 24 24  --  25 23 22   GLUCOSE 243* 116*  --  108* 107* 99  BUN  52* 72*  --  52* 64* 63*  CREATININE 5.14* 7.38*  --  6.34* 8.04* 8.23*  CALCIUM 7.5* 8.0*  --  7.8* 7.9* 8.1*  MG  --   --  2.2 2.0  --   --   PHOS 6.2* 8.2*  --  5.7* 5.8* 6.6*    CBC:  Recent Labs Lab 12/11/14 0558 12/13/14 0505 12/13/14 1205 12/15/14 0500 12/16/14 0300  WBC 15.7* 20.1* 19.3* 26.3* 24.4*  HGB 9.4* 9.0* 9.1* 8.2* 7.9*  HCT 28.5* 27.9* 28.4* 24.9* 24.7*  MCV 90.5 93.0 94.0 91.2 92.2  PLT 324 262 259 141* 152    Liver Function Tests:  Recent Labs Lab 12/12/14 0525 12/13/14 0505 12/14/14 0500 12/15/14 0458 12/16/14 0300  AST  --  21 18  --   --   ALT  --  16* 15*  --   --   ALKPHOS  --  94 88  --   --   BILITOT  --  0.7 0.8  --   --   PROT  --  5.7* 6.0*  --   --   ALBUMIN 1.7* 1.9*  1.9* 1.9* 1.8* 1.9*   Cardiac Enzymes:  Recent Labs Lab 12/11/14 1940 12/11/14 2330 12/12/14 0525  TROPONINI <0.03 <0.03 <0.03    CBG:  Recent Labs Lab 12/09/14 1644 12/09/14 1911 12/09/14 2335  GLUCAP 84 82 96    Recent Results (from the past 240 hour(s))  Clostridium Difficile by PCR     Status: None   Collection Time: 12/08/14  5:15 AM  Result Value Ref Range Status   C difficile by pcr NEGATIVE NEGATIVE Final  Clostridium Difficile by PCR     Status: None   Collection Time: 12/12/14 12:35 AM  Result Value Ref Range Status   C difficile by pcr NEGATIVE NEGATIVE Final  Culture, Urine     Status: None   Collection Time: 12/13/14 11:12 AM  Result Value Ref Range Status   Specimen Description URINE, CATHETERIZED  Final   Special Requests NONE  Final   Colony Count NO GROWTH Performed at [facility]   Final   Culture NO GROWTH Performed at [facility]   Final   Report Status 12/14/2014 FINAL  Final  Culture, blood (routine x 2)     Status: None (Preliminary result)   Collection Time: 12/13/14 11:55 AM  Result Value Ref Range Status   Specimen Description BLOOD RIGHT HAND  Final   Special Requests AEB 3CC  Final   Culture    Final           BLOOD CULTURE RECEIVED NO GROWTH TO DATE CULTURE WILL BE HELD FOR 5 DAYS BEFORE ISSUING A FINAL NEGATIVE REPORT Performed at [facility]    Report Status PENDING  Incomplete  Culture, blood (routine x 2)     Status: None (Preliminary result)   Collection Time: 12/13/14 12:05 PM  Result Value Ref Range Status   Specimen Description BLOOD LEFT ARM  Final   Special Requests BAA 10CC  Final   Culture   Final           BLOOD CULTURE RECEIVED NO GROWTH TO DATE CULTURE WILL BE HELD FOR 5 DAYS BEFORE ISSUING A FINAL NEGATIVE REPORT Performed at  [facility]    Report Status PENDING  Incomplete  Body fluid culture     Status: None (Preliminary result)   Collection Time: 12/13/14 11:05 PM  Result Value Ref Range Status   Specimen Description FLUID  Final   Special Requests GALL BLADDER  Final   Gram Stain   Final    NO WBC SEEN NO ORGANISMS SEEN Performed at [facility]    Culture   Final    FEW STAPHYLOCOCCUS SPECIES (COAGULASE NEGATIVE) Performed at [facility]    Report Status PENDING  Incomplete     Studies:   Recent x-ray studies have been reviewed in detail by the Attending Physician  Scheduled Meds:  Scheduled Meds: . amiodarone  400 mg Oral BID  . antiseptic oral rinse  7 mL Mouth Rinse BID  . arformoterol  15 mcg Nebulization BID  . budesonide (PULMICORT) nebulizer solution  0.5 mg Nebulization BID  . darbepoetin (ARANESP) injection - NON-DIALYSIS  100 mcg Subcutaneous Q Mon-1800  . diltiazem  30 mg Oral 4 times per day  . famotidine  20 mg Oral Daily  . feeding supplement (NEPRO CARB STEADY)  237 mL Oral BID BM  . feeding supplement (RESOURCE BREEZE)  1 Container Oral Q1500  . heparin subcutaneous  5,000 Units Subcutaneous 3 times per day  . ipratropium  0.5 mg Nebulization TID  . levalbuterol  0.63 mg Nebulization TID  . megestrol  800 mg Oral Daily  . metoprolol tartrate  25 mg Oral BID  . midodrine  10 mg  Oral TID WC  . nystatin  5 mL Oral QID    Time spent on care of this patient: 25 mins   Rhea Kaelin T , MD   [facility] Office  [phone] Pager - Text Page per Shea Evans as per below:  On-Call/Text Page:      [email or web address]      password [email or web address]  If 7PM-7AM, please contact night-coverage [email or web address] Password [email or web address] 12/16/2014, 4:11 PM   LOS: 19 days

## 2014-12-16 NOTE — Progress Notes (Signed)
Patient Name: Allen Green Date of Encounter: 12/16/2014   Principal Problem:   Cholecystitis Active Problems:   COPD (chronic obstructive pulmonary disease)   Smoker   Polycythemia secondary to hypoxia   Sepsis   Acute respiratory failure with hypoxia   Acute on chronic respiratory failure   Cardiogenic shock   Septic shock   Renal failure   Sepsis associated hypotension   AKI (acute kidney injury)   Atrial flutter   Sepsis due to bacteroides   Cholecystitis, acute   Acute renal failure syndrome   Atrial fibrillation with RVR   COPD, severe   Tobacco abuse   HCAP (healthcare-associated pneumonia)   Thrush   Hepatitis B core antibody positive   Failure to thrive in adult   Acute renal failure    SUBJECTIVE  Patient states that he feels better today, he denies SOB. Rhythm is atrial flutter/fibrillation with rate still in 98-110 range. BP was soft preventing uptitration of diltiazem or BB yet. On midodrine.  CURRENT MEDS . amiodarone  400 mg Oral Daily  . antiseptic oral rinse  7 mL Mouth Rinse BID  . arformoterol  15 mcg Nebulization BID  . budesonide (PULMICORT) nebulizer solution  0.5 mg Nebulization BID  . darbepoetin (ARANESP) injection - NON-DIALYSIS  100 mcg Subcutaneous Q Mon-1800  . diltiazem  30 mg Oral 4 times per day  . famotidine  20 mg Oral Daily  . feeding supplement (NEPRO CARB STEADY)  237 mL Oral BID BM  . feeding supplement (RESOURCE BREEZE)  1 Container Oral Q1500  . heparin subcutaneous  5,000 Units Subcutaneous 3 times per day  . ipratropium  0.5 mg Nebulization TID  . levalbuterol  0.63 mg Nebulization TID  . megestrol  800 mg Oral Daily  . metoprolol tartrate  25 mg Oral BID  . midodrine  10 mg Oral TID WC  . nystatin  5 mL Oral QID    OBJECTIVE  Filed Vitals:   12/16/14 0755 12/16/14 0800 12/16/14 0900 12/16/14 1000  BP:  90/39 90/48 94/63   Pulse:  73 68 66  Temp:  98.3 F (36.8 C)    TempSrc:  Oral    Resp:  12 13 19   Height:       Weight:      SpO2: 98% 100% 97% 100%    Intake/Output Summary (Last 24 hours) at 12/16/14 1133 Last data filed at 12/16/14 0321  Gross per 24 hour  Intake    157 ml  Output    460 ml  Net   -303 ml   Filed Weights   12/13/14 1717 12/15/14 0416 12/15/14 0822  Weight: 253 lb 4.9 oz (114.9 kg) 251 lb 8.7 oz (114.1 kg) 251 lb 8.7 oz (114.1 kg)    PHYSICAL EXAM  General: Pleasant, NAD. Looks older than stated age. Neuro: Alert and oriented X 3. Moves all extremities spontaneously. Psych: Normal affect. HEENT:  Normal  Neck: Supple without bruits or JVD. Lungs:  Resp regular and unlabored, CTA. Heart: Irregularly irregular. no s3, s4, or murmurs. Abdomen: Soft, non-tender, non-distended, BS + x 4.  Extremities: No clubbing, cyanosis or edema. DP/PT/Radials 2+ and equal bilaterally.  Accessory Clinical Findings  CBC  Recent Labs  12/15/14 0500 12/16/14 0300  WBC 26.3* 24.4*  HGB 8.2* 7.9*  HCT 24.9* 24.7*  MCV 91.2 92.2  PLT 141* 412   Basic Metabolic Panel  Recent Labs  12/13/14 1205  12/14/14 0500 12/15/14 0458 12/16/14 0300  NA  --   < >  136 133* 136  K  --   < > 3.8 3.8 4.5  CL  --   < > 99* 98* 98*  CO2  --   < > 25 23 22   GLUCOSE  --   < > 108* 107* 99  BUN  --   < > 52* 64* 63*  CREATININE  --   < > 6.34* 8.04* 8.23*  CALCIUM  --   < > 7.8* 7.9* 8.1*  MG 2.2  --  2.0  --   --   PHOS  --   < > 5.7* 5.8* 6.6*  < > = values in this interval not displayed. Liver Function Tests  Recent Labs  12/14/14 0500 12/15/14 0458 12/16/14 0300  AST 18  --   --   ALT 15*  --   --   ALKPHOS 88  --   --   BILITOT 0.8  --   --   PROT 6.0*  --   --   ALBUMIN 1.9* 1.8* 1.9*   TELE: Reviewed: Atrial flutter with RVR, ventricular rate 95-110 BPM   ASSESSMENT AND PLAN  1. Atrial flutter with RVR. Echo shows normal LV systolic function. 2. Septic shock with bacteroides related to gallbladder disease 3. Acute renal failure due to AKI/ATN, requiring HD this  admission 4. Chronic polycythemia, now anemic. Hemoccult positive stool so not on full anticoagulation. 5. Profound deconditioning 6. Hypoalbuminemia 1.7  Plan: continue amiodarone loading, I would increase amiodarone to 400 mg po BID for the next 3 days, then decrease to 400 mg po daily for 5 days, then to 200 mg po daily, continue rate control as BP allows.  Dorothy Spark 12/16/2014

## 2014-12-16 NOTE — Progress Notes (Signed)
[facility] Progress Note   Subjective:  Pt feeling well, regaining humor. Unable to do HD very well yest due to cath dysfunction- then was removed as had been in place over 2 weeks ~500cc UOP daily.  Allen Green amiodarone  400 mg Oral Daily  . antiseptic oral rinse  7 mL Mouth Rinse BID  . arformoterol  15 mcg Nebulization BID  . budesonide (PULMICORT) nebulizer solution  0.5 mg Nebulization BID  . darbepoetin (ARANESP) injection - NON-DIALYSIS  100 mcg Subcutaneous Q Mon-1800  . diltiazem  30 mg Oral 4 times per day  . famotidine  20 mg Oral Daily  . feeding supplement (NEPRO CARB STEADY)  237 mL Oral BID BM  . feeding supplement (RESOURCE BREEZE)  1 Container Oral Q1500  . heparin subcutaneous  5,000 Units Subcutaneous 3 times per day  . ipratropium  0.5 mg Nebulization QID  . levalbuterol  0.63 mg Nebulization QID  . megestrol  800 mg Oral Daily  . metoprolol tartrate  25 mg Oral BID  . midodrine  10 mg Oral TID WC  . nystatin  5 mL Oral QID   Objective: Temp:  [96.4 F (35.8 C)-100.2 F (37.9 C)] 96.7 F (35.9 C) (05/15 0300) Pulse Rate:  [45-148] 45 (05/15 0600) Resp:  [16-23] 16 (05/15 0600) BP: (80-117)/(33-66) 98/58 mmHg (05/15 0600) SpO2:  [87 %-100 %] 98 % (05/15 0755) Weight:  [251 lb 8.7 oz (114.1 kg)] 251 lb 8.7 oz (114.1 kg) (05/14 6568) Weight: 255 -> 253 (pre/post HD 5/12) > 251 (5/15)  Intake/Output Summary (Last 24 hours) at 12/16/14 0803 Last data filed at 12/16/14 0321  Gross per 24 hour  Intake    157 ml  Output    598 ml  Net   -441 ml  (HD = 31ml of output)  Exam: Gen - Obese, interactive male in no distress Neck - supple, no JVD- IJ vascath Heart - irregularly irregular, tachy, no murmurs heard Lungs - Nonlabored, improved exp wheezing. Improved air movement. Abd - soft, NTND, no masses, +active BS Ext - 1+ bilateral non-pitting dependent edema, non-tender, peripheral pulses intact +2 b/l Skin - warm, dry, stasis dermatitis on lower  legs Neuro - awake, alert, oriented  UNa 51, UCr 136 UA  gran casts, amber, 100 prot, 1.022, 11-20 wbc, no rbc CXR no chg bibasilar airspace dz  Summary: 64 yo w hx COPD/home O2, obesity admitted in April 2015 with acute cholecystitis, high risk for surgery d/t pulm disease had perc drain placed. Had been managed since with chronic drain with changes per IR.  On 11/03/14 pt was seen by IR for the tube falling out.   There was not an indication for PCT replacement. On 4/26 patient was admitted w septic shock and gallbladder abcess. Pt was admitted to ICU. IR placed another perc drain on 4/26.  Patient developed AKI/ ATN from shock/ contrast and was treated with CRRT from 4/27-5/6 at [facility].   Assessment: 1. Prolonged AKI: in setting of cholecystitis/sepsis and contrast. Baseline creatinine normal (0.90 to 1). CRRT 4/27-5/6 > IHD 5/8, 5/10, 5/12.  1. UOP picking up with essentially stable (and poor) Cr despite unsuccessful HD 5/14. temp cath removed.  2. Evaluate tomorrow AM. IR planning to place [facility] 5/16 but if numbers stable or improved may not need ? [facility] Continue to HOLD coumadin. 2. Bacteroides sepsis/ gallbladder infection, s/p chole tube: Resolving off meropenem (5/9), leukocytosis continues - per primary 3. Volume excess: Improved with HD. no  recent CXR. Wt down to 251 (unclear EDW).  4. Anemia: multifactorial- Hgb dropping, continue Aranesp 150mcg weekly (start 5/9). Not candidate for IV iron d/t infection. May transfuse PRBC Hgb < 7.0 5. A flutter: On Amio PO 400mg  daily and diltiazem 30mg  [id number] complicated by hypotension. 1. Plan eventually for cardioversion; holding anticoagulation until after IR procedure 5/16 2. Amiodarone taper per cardiology  6. Fecal Occult Positive: No chart history of prior colonoscopy. 7. Diarrhea: C.Diff negative 8. Nutrition: Poor PO. Tolerating resource breeze. Nutrition currently following. 9. Dispo: Patient has been accepted into CIR, but transfer pending renal  status. Still hopeful for recovery of renal function. Currently remains on IHD, re-evaluate daily. Ready for  Tunneled HD cath placement on Monday 5/16 if needed.  [name], MD, PGY-2 12/16/2014 8:03 AM  Patient seen and examined, agree with above note with above modifications. Continuing to look better clinically daily- UOP marginal - BUN and creatinine unchanged even with no real HD yest.  Temp cath is out.  IR primed to do tunneled PC tomorrow- will have Dr. Jimmy Footman eval labs in AM- may be able to postpone ?  Allen Parish, MD 12/16/2014     Recent Labs Lab 12/14/14 0500 12/15/14 0458 12/16/14 0300  NA 136 133* 136  K 3.8 3.8 4.5  CL 99* 98* 98*  CO2 25 23 22   GLUCOSE 108* 107* 99  BUN 52* 64* 63*  CREATININE 6.34* 8.04* 8.23*  CALCIUM 7.8* 7.9* 8.1*  PHOS 5.7* 5.8* 6.6*    Recent Labs Lab 12/13/14 0505 12/14/14 0500 12/15/14 0458 12/16/14 0300  AST 21 18  --   --   ALT 16* 15*  --   --   ALKPHOS 94 88  --   --   BILITOT 0.7 0.8  --   --   PROT 5.7* 6.0*  --   --   ALBUMIN 1.9*  1.9* 1.9* 1.8* 1.9*    Recent Labs Lab 12/13/14 1205 12/15/14 0500 12/16/14 0300  WBC 19.3* 26.3* 24.4*  HGB 9.1* 8.2* 7.9*  HCT 28.4* 24.9* 24.7*  MCV 94.0 91.2 92.2  PLT 259 141* 152

## 2014-12-17 ENCOUNTER — Inpatient Hospital Stay (HOSPITAL_COMMUNITY): Payer: Medicare Other

## 2014-12-17 LAB — CBC WITH DIFFERENTIAL/PLATELET
BASOS ABS: 0.1 10*3/uL (ref 0.0–0.1)
BASOS PCT: 1 % (ref 0–1)
Eosinophils Absolute: 0.2 10*3/uL (ref 0.0–0.7)
Eosinophils Relative: 1 % (ref 0–5)
HCT: 24.2 % — ABNORMAL LOW (ref 39.0–52.0)
HEMOGLOBIN: 7.9 g/dL — AB (ref 13.0–17.0)
Lymphocytes Relative: 11 % — ABNORMAL LOW (ref 12–46)
Lymphs Abs: 1.9 10*3/uL (ref 0.7–4.0)
MCH: 30.3 pg (ref 26.0–34.0)
MCHC: 32.6 g/dL (ref 30.0–36.0)
MCV: 92.7 fL (ref 78.0–100.0)
Monocytes Absolute: 1.3 10*3/uL — ABNORMAL HIGH (ref 0.1–1.0)
Monocytes Relative: 7 % (ref 3–12)
NEUTROS ABS: 14.2 10*3/uL — AB (ref 1.7–7.7)
NEUTROS PCT: 80 % — AB (ref 43–77)
Platelets: 166 10*3/uL (ref 150–400)
RBC: 2.61 MIL/uL — AB (ref 4.22–5.81)
RDW: 15.9 % — AB (ref 11.5–15.5)
WBC: 17.7 10*3/uL — AB (ref 4.0–10.5)

## 2014-12-17 LAB — RENAL FUNCTION PANEL
ALBUMIN: 1.9 g/dL — AB (ref 3.5–5.0)
Anion gap: 15 (ref 5–15)
BUN: 76 mg/dL — AB (ref 6–20)
CO2: 21 mmol/L — ABNORMAL LOW (ref 22–32)
CREATININE: 9.99 mg/dL — AB (ref 0.61–1.24)
Calcium: 8.1 mg/dL — ABNORMAL LOW (ref 8.9–10.3)
Chloride: 99 mmol/L — ABNORMAL LOW (ref 101–111)
GFR calc Af Amer: 6 mL/min — ABNORMAL LOW (ref >60)
GFR calc non Af Amer: 5 mL/min — ABNORMAL LOW (ref >60)
Glucose, Bld: 104 mg/dL — ABNORMAL HIGH (ref 65–99)
Phosphorus: 8 mg/dL — ABNORMAL HIGH (ref 2.5–4.6)
Potassium: 4.6 mmol/L (ref 3.5–5.1)
Sodium: 135 mmol/L (ref 135–145)

## 2014-12-17 LAB — PROTIME-INR
INR: 1.32 (ref 0.00–1.49)
PROTHROMBIN TIME: 16.5 s — AB (ref 11.6–15.2)

## 2014-12-17 MED ORDER — DARBEPOETIN ALFA 200 MCG/0.4ML IJ SOSY
PREFILLED_SYRINGE | INTRAMUSCULAR | Status: AC
  Administered 2014-12-17: 17:00:00
  Filled 2014-12-17: qty 0.4

## 2014-12-17 MED ORDER — CEFAZOLIN SODIUM-DEXTROSE 2-3 GM-% IV SOLR
2.0000 g | INTRAVENOUS | Status: DC
  Filled 2014-12-17: qty 50

## 2014-12-17 MED ORDER — LIDOCAINE HCL 1 % IJ SOLN
INTRAMUSCULAR | Status: AC
  Filled 2014-12-17: qty 20

## 2014-12-17 MED ORDER — MIDAZOLAM HCL 2 MG/2ML IJ SOLN
INTRAMUSCULAR | Status: AC | PRN
  Administered 2014-12-17 (×2): 0.5 mg via INTRAVENOUS

## 2014-12-17 MED ORDER — METOPROLOL TARTRATE 12.5 MG HALF TABLET
12.5000 mg | ORAL_TABLET | Freq: Two times a day (BID) | ORAL | Status: DC
  Administered 2014-12-17 – 2014-12-18 (×2): 12.5 mg via ORAL
  Filled 2014-12-17 (×3): qty 1

## 2014-12-17 MED ORDER — SODIUM CHLORIDE 0.9 % IV SOLN
INTRAVENOUS | Status: AC | PRN
  Administered 2014-12-17: 10 mL/h via INTRAVENOUS

## 2014-12-17 MED ORDER — DARBEPOETIN ALFA 200 MCG/0.4ML IJ SOSY
200.0000 ug | PREFILLED_SYRINGE | INTRAMUSCULAR | Status: DC
  Filled 2014-12-17: qty 0.4

## 2014-12-17 MED ORDER — HEPARIN SODIUM (PORCINE) 1000 UNIT/ML IJ SOLN
INTRAMUSCULAR | Status: AC
  Filled 2014-12-17: qty 1

## 2014-12-17 MED ORDER — FENTANYL CITRATE (PF) 100 MCG/2ML IJ SOLN
INTRAMUSCULAR | Status: AC | PRN
Start: 2014-12-17 — End: 2014-12-17
  Administered 2014-12-17: 25 ug via INTRAVENOUS

## 2014-12-17 MED ORDER — FENTANYL CITRATE (PF) 100 MCG/2ML IJ SOLN
INTRAMUSCULAR | Status: AC
  Filled 2014-12-17: qty 2

## 2014-12-17 MED ORDER — CEFAZOLIN SODIUM-DEXTROSE 2-3 GM-% IV SOLR
INTRAVENOUS | Status: AC
  Administered 2014-12-17: 2000 mg
  Filled 2014-12-17: qty 50

## 2014-12-17 MED ORDER — MIDODRINE HCL 5 MG PO TABS
ORAL_TABLET | ORAL | Status: AC
  Filled 2014-12-17: qty 2

## 2014-12-17 MED ORDER — MIDAZOLAM HCL 2 MG/2ML IJ SOLN
INTRAMUSCULAR | Status: AC
  Filled 2014-12-17: qty 2

## 2014-12-17 NOTE — Procedures (Signed)
I was present at this session.  I have reviewed the session itself and made appropriate changes.  HD via new PC.  Flows low to start, gradually ^.  Jaymeson Mengel L 5/16/20163:02 PM

## 2014-12-17 NOTE — Progress Notes (Signed)
Nutrition Follow-up  DOCUMENTATION CODES:  Obesity unspecified  INTERVENTION:  Nepro shake, Boost Breeze   Continue Nepro BID and Boost Breeze daily.  NUTRITION DIAGNOSIS:  Inadequate oral intake related to  (poor appetite) as evidenced by meal completion < 50%.  Ongoing.  GOAL:  Patient will meet greater than or equal to 90% of their needs  Unmet.  MONITOR:  PO intake, Supplement acceptance, Weight trends, Skin   ASSESSMENT: 64 year old Caucasian man with past medical history significant for COPD, GERD and a recently discharged gallbladder drain with development of an abscess and septic shock/acute respiratory failure who presented to the hospital 4/25. Had a CT scan of his abdomen/pelvis with intravenous contrast yesterday to study his area of dislodged gallbladder drain better prior to undergoing decompression of subcutaneous abscess and new drain placement in gallbladder.   PO intake has improved slightly. Patient is consuming 0-50% of Renal diet meals.  Is receiving Nepro Shakes BID and Boost Breeze once daily.  Currently NPO for permanent HD access placement.  Labs reviewed: potassium WNL, phosphorus elevated at 8.  Height:  Ht Readings from Last 1 Encounters:  11/27/14 5\' 11"  (1.803 m)    Weight:  Wt Readings from Last 1 Encounters:  12/15/14 251 lb 8.7 oz (114.1 kg)    Ideal Body Weight:  78.2 kg  Wt Readings from Last 10 Encounters:  12/15/14 251 lb 8.7 oz (114.1 kg)  11/19/14 250 lb (113.399 kg)  11/13/14 245 lb (111.131 kg)  09/16/14 264 lb 8 oz (119.976 kg)  02/22/14 260 lb (117.935 kg)  02/19/14 262 lb 6.4 oz (119.024 kg)  01/24/14 255 lb (115.667 kg)  12/21/13 258 lb 9.6 oz (117.3 kg)  12/18/13 260 lb 6.4 oz (118.117 kg)  11/23/13 267 lb 3.2 oz (121.201 kg)    BMI:  Body mass index is 35.1 kg/(m^2).  Estimated Nutritional Needs:  Kcal:  1856-3149  Protein:  135-155 gm  Fluid:  1.2 L  Skin:  Wound (see comment) (MASD to buttocks,  perineum, scrotum)  Diet Order:  Diet NPO time specified Except for: Sips with Meds, Ice Chips  EDUCATION NEEDS:  Education needs no appropriate at this time   Intake/Output Summary (Last 24 hours) at 12/17/14 1148 Last data filed at 12/17/14 1100  Gross per 24 hour  Intake    440 ml  Output    310 ml  Net    130 ml    Last BM:  5/16   Molli Barrows, RD, LDN, [place] Pager [phone] After Hours Pager [phone]

## 2014-12-17 NOTE — Progress Notes (Signed)
Occupational Therapy Cancellation Note:  Pt currently in HD.  Will reattempt.    Lucille Passy, OTR/L [phone]

## 2014-12-17 NOTE — Procedures (Signed)
Placement of right jugular Perm Cath.  Tip in SVC and ready to use.  Minimal blood loss and no immediate complication.

## 2014-12-17 NOTE — Progress Notes (Signed)
I met with pt and his wife at bedside. They stay pt was up to chair for a few minutes Friday but otherwise in bed. I continue to encourage as much activity as possible out of bed. I will follow. [phone]

## 2014-12-17 NOTE — Progress Notes (Signed)
Medicare Important Message given? YES (If response is "NO", the following Medicare IM given date fields will be blank) Date Medicare IM given:12/17/14 Medicare IM given by: Nazareth Norenberg 

## 2014-12-17 NOTE — Progress Notes (Signed)
Subjective: Interval History: has no complaint.  Objective: Vital signs in last 24 hours: Temp:  [97.4 F (36.3 C)-98.3 F (36.8 C)] 97.9 F (36.6 C) (05/16 0724) Pulse Rate:  [36-103] 98 (05/16 0800) Resp:  [12-25] 15 (05/16 0800) BP: (80-115)/(43-88) 89/56 mmHg (05/16 0927) SpO2:  [94 %-100 %] 98 % (05/16 0800) Weight change:   Intake/Output from previous day: 05/15 0701 - 05/16 0700 In: 675 [P.O.:660] Out: 360 [Urine:300; Drains:60] Intake/Output this shift: Total I/O In: 35 [P.O.:30; Other:5] Out: -   General appearance: cooperative, moderately obese, pale and slowed mentation Resp: diminished breath sounds bilaterally and rhonchi bilaterally Cardio: S1, S2 normal and systolic murmur: holosystolic 2/6, blowing at apex GI: 0bese,pos bs, drain R lower abdm Extremities: edema 2+  Lab Results:  Recent Labs  12/16/14 0300 12/17/14 0600  WBC 24.4* 17.7*  HGB 7.9* 7.9*  HCT 24.7* 24.2*  PLT 152 166   BMET:  Recent Labs  12/16/14 0300 12/17/14 0600  NA 136 135  K 4.5 4.6  CL 98* 99*  CO2 22 21*  GLUCOSE 99 104*  BUN 63* 76*  CREATININE 8.23* 9.99*  CALCIUM 8.1* 8.1*   No results for input(s): PTH in the last 72 hours. Iron Studies: No results for input(s): IRON, TIBC, TRANSFERRIN, FERRITIN in the last 72 hours.  Studies/Results: No results found.  I have reviewed the patient's current medications.  Assessment/Plan: 1  AKI contrast, low bp.  Oliguric, rapid rise Cr, not recovering.  Needs HD.  Will get PC and do HD 2 Anemia ^ epo 3 Sepsis with bacteroides 4 GB dz 5 Obesity 6 COPD 7 Arrhythmias better control ^ amio 8 low bp lower metop, and if stable lower cardiazem.  Cont AMIO.  If cont low may need pressors, on mido 9 tobacco abuse P PC, HD, lower metop, AB.   Long term not candidate for HD if does not have cholecystectomy    LOS: 20 days   Allen Green L 12/17/2014,11:20 AM

## 2014-12-17 NOTE — Progress Notes (Signed)
Subjective: Breathing better.   Objective: Vital signs in last 24 hours: Temp:  [97.4 F (36.3 C)-98.3 F (36.8 C)] 97.9 F (36.6 C) (05/16 0724) Pulse Rate:  [36-103] 98 (05/16 0800) Resp:  [12-25] 15 (05/16 0800) BP: (80-115)/(43-88) 89/56 mmHg (05/16 0927) SpO2:  [94 %-100 %] 98 % (05/16 0800) Last BM Date: 12/17/14  Intake/Output from previous day: 05/15 0701 - 05/16 0700 In: 675 [P.O.:660] Out: 360 [Urine:300; Drains:60] Intake/Output this shift: Total I/O In: 35 [P.O.:30; Other:5] Out: -   Medications Scheduled Meds: . amiodarone  400 mg Oral BID  . antiseptic oral rinse  7 mL Mouth Rinse BID  . arformoterol  15 mcg Nebulization BID  . budesonide (PULMICORT) nebulizer solution  0.5 mg Nebulization BID  . darbepoetin (ARANESP) injection - NON-DIALYSIS  100 mcg Subcutaneous Q Mon-1800  . diltiazem  30 mg Oral 4 times per day  . famotidine  20 mg Oral Daily  . feeding supplement (NEPRO CARB STEADY)  237 mL Oral BID BM  . feeding supplement (RESOURCE BREEZE)  1 Container Oral Q1500  . heparin subcutaneous  5,000 Units Subcutaneous 3 times per day  . ipratropium  0.5 mg Nebulization TID  . levalbuterol  0.63 mg Nebulization TID  . megestrol  800 mg Oral Daily  . metoprolol tartrate  25 mg Oral BID  . midodrine  10 mg Oral TID WC  . nystatin  5 mL Oral QID   Continuous Infusions:  PRN Meds:.sodium chloride, sodium chloride, acetaminophen **OR** acetaminophen, docusate, feeding supplement (NEPRO CARB STEADY), fentaNYL (SUBLIMAZE) injection, heparin, levalbuterol, lidocaine (PF), lidocaine-prilocaine, ondansetron **OR** ondansetron (ZOFRAN) IV, pentafluoroprop-tetrafluoroeth, promethazine, RESOURCE THICKENUP CLEAR, sodium chloride, traZODone  PE: General appearance: alert, cooperative and no distress Lungs: clear to auscultation bilaterally Heart: regular rate and rhythm, S1, S2 normal, no murmur, click, rub or gallop Abdomen: +BS, nontender Extremities: No  LEE Pulses: 2+ and symmetric Skin: Warm and dry Neurologic: Grossly normal  Lab Results:   Recent Labs  12/15/14 0500 12/16/14 0300 12/17/14 0600  WBC 26.3* 24.4* 17.7*  HGB 8.2* 7.9* 7.9*  HCT 24.9* 24.7* 24.2*  PLT 141* 152 166   BMET  Recent Labs  12/15/14 0458 12/16/14 0300 12/17/14 0600  NA 133* 136 135  K 3.8 4.5 4.6  CL 98* 98* 99*  CO2 23 22 21*  GLUCOSE 107* 99 104*  BUN 64* 63* 76*  CREATININE 8.04* 8.23* 9.99*  CALCIUM 7.9* 8.1* 8.1*   PT/INR  Recent Labs  12/17/14 0600  LABPROT 16.5*  INR 1.32      Assessment/Plan   Principal Problem:   Cholecystitis Active Problems:   COPD (chronic obstructive pulmonary disease)   Smoker   Polycythemia secondary to hypoxia   Sepsis   Acute respiratory failure with hypoxia   Acute on chronic respiratory failure   Cardiogenic shock   Septic shock   Renal failure   Sepsis associated hypotension   AKI (acute kidney injury)   Atrial flutter   Sepsis due to bacteroides   Cholecystitis, acute   Acute renal failure syndrome   Atrial fibrillation with RVR   COPD, severe   Tobacco abuse   HCAP (healthcare-associated pneumonia)   Thrush   Hepatitis B core antibody positive   Failure to thrive in adult   Acute renal failure  1. Atrial flutter with RVR.  - actually looks like his is in&out of flutter - currently in NSRl rate is controlled in the 80-90's.  Echo shows normal LV  systolic function. Agree with Dr. Selinda Flavin: increase amiodarone to 400 mg po BID for the next 3 days, then decrease to 400 mg po daily for 5 days, then to 200 mg po daily.  On Cardizem 30mg  Q6hr, lopressor 25 bid.  EF 55-60%.  Inadequate images for wall motion assessment.   2. Septic shock with bacteroides related to gallbladder disease - completed Abx. 3. Acute renal failure due to AKI/ATN, requiring HD this admission 4. Chronic polycythemia, now anemic. Hemoccult positive stool so not on full anticoagulation. 5. Profound  deconditioning 6. Hypoalbuminemia 1.7   LOS: 20 days    HAGER, BRYAN PA-C 12/17/2014 10:31 AM  I have seen, examined and evaluated the patient this AM along with Mr. Samara Snide, [place].  After reviewing all the available data and chart,  I agree with his findings, examination as well as impression recommendations.  Stable HR on Amiodarone - taper as recommended. Check EKG today (difficult to determine true rhythm on Tele) Blood presso  No SSx of CHF Plan today is for HD catheter placement for AKI.; Off Abx for Cholecystitis   Remains hypotensive - we could decrease / hold CCB dose as his rate/rhythm is more controlled to allow for HD  Leonie Man, M.D., M.S. Interventional Cardiologist   Pager # [phone]

## 2014-12-17 NOTE — Progress Notes (Signed)
TEAM 1 - Stepdown/ICU TEAM Progress Note  Allen Green GGY:[id number] DOB: 1950/11/13 DOA: 11/02/2014 PCP: Linton Rump, PA  Admit HPI / Brief Narrative: 64 y/o male admitted on 4/26 with septic shock and acute respiratory failure due to cholecystitis and an abscess associated with a recently dislodged gallbladder drain.  Course complicated by bibasilar HCAP with severe hypoxemia and severe autoPEEP.  Significant Events: 4/26 admitted - intubated, gallbladder drain placed by IR 4/29 multiple amio boluses for RVR 4/30 worsening hypoxia 5/3 improving pressor needs 5/4 SBTs 5/5 extubated 5/6 off CVVHD 5/8 transfer to [facility] for intermittent HD 5/11 [facility] assumes care  HPI/Subjective: The patient is alert conversant and jovial today.  He denies any new complaints.  He tolerated the placement of his tunneled hemodialysis catheter well.  He denies chest pain nausea vomiting abdominal pain fevers or chills.  Assessment/Plan:  Acute respiratory failure with hypercapnea Resolved  Septic shock due to Bacteroides Resolved - due to gallbladder infection - antibiotic treatment course completed  Acute Cholecystis with peri-gallbladder abscess due to dislodged percutaneous drain  s/p drain 4/26 per IR - prior drain 4/3 > 4/12 - will need eventual cholecystectomy  Acute kidney failure requiring dialysis Due to sepsis and contrast exposure - Nephrology following - urine output holding at approximate 500 cc per day - tunneled hemodialysis catheter placed today due to climbing creatinine with plan for hemodialysis later today    Atrial flutter w/ RVR CHADSVASC = 2 - care per Cardiology - rate control has been a challenge in the setting of hypotension - initiate Coumadin once permanent dialysis catheter placed and stable - continue oral Cardizem as blood pressure allows - amiodarone being titrated by cardiology  Very difficult vascular access Patient currently has a left antecubital IV -  tunneled dialysis catheter placed today  Severe COPD Tobacco abuse  Counseled on absolute need to discontinue tobacco abuse permanently - quiescent at present  Ileus Clinically resolved  BLL HCAP Clinically resolved  Chronic polycythemia due to COPD/hypoxia - now anemic Due to critical illness and acute kidney failure - no IV iron due to bacteremia - erythropoietin per nephrology - hemoglobin stable at approximately 8 - follow trend  Thrush Treatment initiated  Profound deconditioned state For eventual CIR placement  Hepatitis B core antibody positive This isolated positive result is unclear and could represent a resolved infection, false positive test, or even a chronic low-grade infection - will need to be followed as outpatient - follow-up LFTs in a.m.  Code Status: FULL Family Communication: Spoke with patient, wife, and daughter at bedside Disposition Plan: SDU  Consultants: [facility] Cardiology Nephrology Pulmonary  Procedures: 4/26 TTE > poor study, can't comment on LV or RV size/function  Antibiotics: None  DVT prophylaxis: Subcutaneous heparin  Objective: Blood pressure 96/57, pulse 97, temperature 98.3 F (36.8 C), temperature source Oral, resp. rate 19, height 5\' 11"  (1.803 m), weight 114.1 kg (251 lb 8.7 oz), SpO2 96 %.  Intake/Output Summary (Last 24 hours) at 12/17/14 1422 Last data filed at 12/17/14 1206  Gross per 24 hour  Intake    320 ml  Output    285 ml  Net     35 ml   Exam: General: No acute respiratory distress - alert and conversant Lungs: Clear to auscultation without wheeze Cardiovascular: Irregularly irregular with rate approximately 90bpm without appreciable murmur Abdomen: Nontender, nondistended, soft, bowel sounds positive, no rebound, no appreciable mass Extremities: No significant cyanosis, clubbing;  1+ edema bilateral lower extremities  Data Reviewed: Basic Metabolic Panel:  Recent Labs Lab 12/13/14 0505 12/13/14 1205  12/14/14 0500 12/15/14 0458 12/16/14 0300 12/17/14 0600  NA 137  --  136 133* 136 135  K 4.1  --  3.8 3.8 4.5 4.6  CL 103  --  99* 98* 98* 99*  CO2 24  --  25 23 22  21*  GLUCOSE 116*  --  108* 107* 99 104*  BUN 72*  --  52* 64* 63* 76*  CREATININE 7.38*  --  6.34* 8.04* 8.23* 9.99*  CALCIUM 8.0*  --  7.8* 7.9* 8.1* 8.1*  MG  --  2.2 2.0  --   --   --   PHOS 8.2*  --  5.7* 5.8* 6.6* 8.0*    CBC:  Recent Labs Lab 12/13/14 0505 12/13/14 1205 12/15/14 0500 12/16/14 0300 12/17/14 0600  WBC 20.1* 19.3* 26.3* 24.4* 17.7*  NEUTROABS  --   --   --   --  14.2*  HGB 9.0* 9.1* 8.2* 7.9* 7.9*  HCT 27.9* 28.4* 24.9* 24.7* 24.2*  MCV 93.0 94.0 91.2 92.2 92.7  PLT 262 259 141* 152 166    Liver Function Tests:  Recent Labs Lab 12/13/14 0505 12/14/14 0500 12/15/14 0458 12/16/14 0300 12/17/14 0600  AST 21 18  --   --   --   ALT 16* 15*  --   --   --   ALKPHOS 94 88  --   --   --   BILITOT 0.7 0.8  --   --   --   PROT 5.7* 6.0*  --   --   --   ALBUMIN 1.9*  1.9* 1.9* 1.8* 1.9* 1.9*   Cardiac Enzymes:  Recent Labs Lab 12/11/14 1940 12/11/14 2330 12/12/14 0525  TROPONINI <0.03 <0.03 <0.03    Recent Results (from the past 240 hour(s))  Clostridium Difficile by PCR     Status: None   Collection Time: 12/08/14  5:15 AM  Result Value Ref Range Status   C difficile by pcr NEGATIVE NEGATIVE Final  Clostridium Difficile by PCR     Status: None   Collection Time: 12/12/14 12:35 AM  Result Value Ref Range Status   C difficile by pcr NEGATIVE NEGATIVE Final  Culture, Urine     Status: None   Collection Time: 12/13/14 11:12 AM  Result Value Ref Range Status   Specimen Description URINE, CATHETERIZED  Final   Special Requests NONE  Final   Colony Count NO GROWTH Performed at [facility]   Final   Culture NO GROWTH Performed at [facility]   Final   Report Status 12/14/2014 FINAL  Final  Culture, blood (routine x 2)     Status: None (Preliminary  result)   Collection Time: 12/13/14 11:55 AM  Result Value Ref Range Status   Specimen Description BLOOD RIGHT HAND  Final   Special Requests AEB 3CC  Final   Culture   Final           BLOOD CULTURE RECEIVED NO GROWTH TO DATE CULTURE WILL BE HELD FOR 5 DAYS BEFORE ISSUING A FINAL NEGATIVE REPORT Performed at [facility]    Report Status PENDING  Incomplete  Culture, blood (routine x 2)     Status: None (Preliminary result)   Collection Time: 12/13/14 12:05 PM  Result Value Ref Range Status   Specimen Description BLOOD LEFT ARM  Final   Special Requests BAA 10CC  Final   Culture  Final           BLOOD CULTURE RECEIVED NO GROWTH TO DATE CULTURE WILL BE HELD FOR 5 DAYS BEFORE ISSUING A FINAL NEGATIVE REPORT Performed at [facility]    Report Status PENDING  Incomplete  Body fluid culture     Status: None (Preliminary result)   Collection Time: 12/13/14 11:05 PM  Result Value Ref Range Status   Specimen Description FLUID  Final   Special Requests GALL BLADDER  Final   Gram Stain   Final    NO WBC SEEN NO ORGANISMS SEEN Performed at [facility]    Culture   Final    FEW STAPHYLOCOCCUS SPECIES (COAGULASE NEGATIVE) Performed at [facility]    Report Status PENDING  Incomplete     Studies:   Recent x-ray studies have been reviewed in detail by the Attending Physician  Scheduled Meds:  Scheduled Meds: . amiodarone  400 mg Oral BID  . antiseptic oral rinse  7 mL Mouth Rinse BID  . arformoterol  15 mcg Nebulization BID  . budesonide (PULMICORT) nebulizer solution  0.5 mg Nebulization BID  .  ceFAZolin (ANCEF) IV  2 g Intravenous to XRAY  . darbepoetin (ARANESP) injection - NON-DIALYSIS  200 mcg Subcutaneous Q Mon-1800  . diltiazem  30 mg Oral 4 times per day  . famotidine  20 mg Oral Daily  . feeding supplement (NEPRO CARB STEADY)  237 mL Oral BID BM  . feeding supplement (RESOURCE BREEZE)  1 Container Oral Q1500  . fentaNYL      .  heparin      . heparin subcutaneous  5,000 Units Subcutaneous 3 times per day  . ipratropium  0.5 mg Nebulization TID  . levalbuterol  0.63 mg Nebulization TID  . lidocaine      . megestrol  800 mg Oral Daily  . metoprolol tartrate  12.5 mg Oral BID  . midazolam      . midodrine  10 mg Oral TID WC  . nystatin  5 mL Oral QID    Time spent on care of this patient: 25 mins   MCCLUNG,JEFFREY T , MD   [facility] Office  [phone] Pager - Text Page per Shea Evans as per below:  On-Call/Text Page:      [email or web address]      password [email or web address]  If 7PM-7AM, please contact night-coverage [email or web address] Password [email or web address] 12/17/2014, 2:22 PM   LOS: 20 days

## 2014-12-18 DIAGNOSIS — R768 Other specified abnormal immunological findings in serum: Secondary | ICD-10-CM | POA: Diagnosis present

## 2014-12-18 DIAGNOSIS — K567 Ileus, unspecified: Secondary | ICD-10-CM | POA: Diagnosis present

## 2014-12-18 DIAGNOSIS — I483 Typical atrial flutter: Secondary | ICD-10-CM | POA: Insufficient documentation

## 2014-12-18 DIAGNOSIS — D751 Secondary polycythemia: Secondary | ICD-10-CM | POA: Diagnosis present

## 2014-12-18 DIAGNOSIS — J9622 Acute and chronic respiratory failure with hypercapnia: Secondary | ICD-10-CM | POA: Diagnosis present

## 2014-12-18 DIAGNOSIS — K810 Acute cholecystitis: Secondary | ICD-10-CM | POA: Diagnosis present

## 2014-12-18 LAB — RENAL FUNCTION PANEL
Albumin: 1.8 g/dL — ABNORMAL LOW (ref 3.5–5.0)
Anion gap: 15 (ref 5–15)
BUN: 49 mg/dL — ABNORMAL HIGH (ref 6–20)
CHLORIDE: 98 mmol/L — AB (ref 101–111)
CO2: 22 mmol/L (ref 22–32)
Calcium: 7.8 mg/dL — ABNORMAL LOW (ref 8.9–10.3)
Creatinine, Ser: 7.33 mg/dL — ABNORMAL HIGH (ref 0.61–1.24)
GFR calc Af Amer: 8 mL/min — ABNORMAL LOW (ref >60)
GFR, EST NON AFRICAN AMERICAN: 7 mL/min — AB (ref >60)
GLUCOSE: 83 mg/dL (ref 65–99)
Phosphorus: 5.6 mg/dL — ABNORMAL HIGH (ref 2.5–4.6)
Potassium: 4 mmol/L (ref 3.5–5.1)
Sodium: 135 mmol/L (ref 135–145)

## 2014-12-18 LAB — HEPATIC FUNCTION PANEL
ALT: 11 U/L — AB (ref 17–63)
AST: 14 U/L — AB (ref 15–41)
Albumin: 1.8 g/dL — ABNORMAL LOW (ref 3.5–5.0)
Alkaline Phosphatase: 72 U/L (ref 38–126)
BILIRUBIN DIRECT: 0.2 mg/dL (ref 0.1–0.5)
BILIRUBIN TOTAL: 0.5 mg/dL (ref 0.3–1.2)
Indirect Bilirubin: 0.3 mg/dL (ref 0.3–0.9)
Total Protein: 6.4 g/dL — ABNORMAL LOW (ref 6.5–8.1)

## 2014-12-18 LAB — BODY FLUID CULTURE: Gram Stain: NONE SEEN

## 2014-12-18 LAB — CBC
HCT: 23.5 % — ABNORMAL LOW (ref 39.0–52.0)
Hemoglobin: 7.6 g/dL — ABNORMAL LOW (ref 13.0–17.0)
MCH: 29.9 pg (ref 26.0–34.0)
MCHC: 32.3 g/dL (ref 30.0–36.0)
MCV: 92.5 fL (ref 78.0–100.0)
PLATELETS: 108 10*3/uL — AB (ref 150–400)
RBC: 2.54 MIL/uL — AB (ref 4.22–5.81)
RDW: 15.9 % — AB (ref 11.5–15.5)
WBC: 16.5 10*3/uL — ABNORMAL HIGH (ref 4.0–10.5)

## 2014-12-18 LAB — IRON AND TIBC
Iron: 14 ug/dL — ABNORMAL LOW (ref 45–182)
SATURATION RATIOS: 7 % — AB (ref 17.9–39.5)
TIBC: 192 ug/dL — AB (ref 250–450)
UIBC: 178 ug/dL

## 2014-12-18 LAB — CLOSTRIDIUM DIFFICILE BY PCR: CDIFFPCR: NEGATIVE

## 2014-12-18 MED ORDER — AMIODARONE LOAD VIA INFUSION
150.0000 mg | Freq: Once | INTRAVENOUS | Status: AC
  Administered 2014-12-18: 150 mg via INTRAVENOUS
  Filled 2014-12-18: qty 83.34

## 2014-12-18 MED ORDER — AMIODARONE HCL IN DEXTROSE 360-4.14 MG/200ML-% IV SOLN
60.0000 mg/h | INTRAVENOUS | Status: AC
  Administered 2014-12-18: 60 mg/h via INTRAVENOUS
  Filled 2014-12-18 (×2): qty 200

## 2014-12-18 MED ORDER — DILTIAZEM HCL 30 MG PO TABS
30.0000 mg | ORAL_TABLET | Freq: Once | ORAL | Status: AC
  Administered 2014-12-18: 30 mg via ORAL
  Filled 2014-12-18: qty 1

## 2014-12-18 MED ORDER — AMIODARONE HCL IN DEXTROSE 360-4.14 MG/200ML-% IV SOLN
30.0000 mg/h | INTRAVENOUS | Status: DC
  Administered 2014-12-18 – 2014-12-19 (×2): 30 mg/h via INTRAVENOUS
  Filled 2014-12-18 (×2): qty 200

## 2014-12-18 NOTE — Progress Notes (Signed)
Patient Name: Allen Green Date of Encounter: 12/18/2014  Principal Problem:   Cholecystitis Active Problems:   COPD (chronic obstructive pulmonary disease)   Smoker   Polycythemia secondary to hypoxia   Sepsis   Acute respiratory failure with hypoxia   Acute on chronic respiratory failure   Cardiogenic shock   Septic shock   Renal failure   Sepsis associated hypotension   AKI (acute kidney injury)   Atrial flutter   Sepsis due to bacteroides   Cholecystitis, acute   Acute renal failure syndrome   Atrial fibrillation with RVR   COPD, severe   Tobacco abuse   HCAP (healthcare-associated pneumonia)   Thrush   Hepatitis B core antibody positive   Failure to thrive in adult   Acute renal failure  SUBJECTIVE  Feels better. Breathing improved. Denies chest pain or SOB. Feels fast heart rate.   CURRENT MEDS . amiodarone  400 mg Oral BID  . antiseptic oral rinse  7 mL Mouth Rinse BID  . arformoterol  15 mcg Nebulization BID  . budesonide (PULMICORT) nebulizer solution  0.5 mg Nebulization BID  . darbepoetin (ARANESP) injection - NON-DIALYSIS  200 mcg Subcutaneous Q Mon-1800  . diltiazem  30 mg Oral 4 times per day  . famotidine  20 mg Oral Daily  . feeding supplement (NEPRO CARB STEADY)  237 mL Oral BID BM  . feeding supplement (RESOURCE BREEZE)  1 Container Oral Q1500  . heparin subcutaneous  5,000 Units Subcutaneous 3 times per day  . ipratropium  0.5 mg Nebulization TID  . levalbuterol  0.63 mg Nebulization TID  . megestrol  800 mg Oral Daily  . metoprolol tartrate  12.5 mg Oral BID  . midodrine  10 mg Oral TID WC  . nystatin  5 mL Oral QID    OBJECTIVE  Filed Vitals:   12/18/14 0514 12/18/14 0617 12/18/14 0700 12/18/14 1005  BP: 113/52 103/70    Pulse: 134 134    Temp:   97.7 F (36.5 C)   TempSrc:   Oral   Resp: 19 18    Height:      Weight:      SpO2: 100% 97%  100%    Intake/Output Summary (Last 24 hours) at 12/18/14 1138 Last data filed at  12/18/14 0734  Gross per 24 hour  Intake     70 ml  Output   2155 ml  Net  -2085 ml   Filed Weights   12/15/14 0822 12/17/14 1448 12/17/14 1900  Weight: 251 lb 8.7 oz (114.1 kg) 249 lb 1.9 oz (113 kg) 239 lb 13.8 oz (108.8 kg)    PHYSICAL EXAM  General: Pleasant, NAD. Neuro: Alert and oriented X 3. Moves all extremities spontaneously. Psych: Normal affect. HEENT:  Normal  Neck: Supple without bruits or JVD. Lungs:  Resp regular and unlabored, CTA bilaterally.  Heart: regular rhythm with rates in 130s. no s3, s4, or murmurs. Abdomen: Soft, non-tender, non-distended, BS + x 4.  Extremities: No clubbing, cyanosis or LE edema. DP/PT/Radials 2+ and equal bilaterally. Skin: Warm and dry  Accessory Clinical Findings  CBC  Recent Labs  12/17/14 0600 12/18/14 0218  WBC 17.7* 16.5*  NEUTROABS 14.2*  --   HGB 7.9* 7.6*  HCT 24.2* 23.5*  MCV 92.7 92.5  PLT 166 481*   Basic Metabolic Panel  Recent Labs  12/17/14 0600 12/18/14 0218  NA 135 135  K 4.6 4.0  CL 99* 98*  CO2 21* 22  GLUCOSE 104* 83  BUN 76* 49*  CREATININE 9.99* 7.33*  CALCIUM 8.1* 7.8*  PHOS 8.0* 5.6*   Liver Function Tests  Recent Labs  12/17/14 0600 12/18/14 0218  AST  --  14*  ALT  --  11*  ALKPHOS  --  72  BILITOT  --  0.5  PROT  --  6.4*  ALBUMIN 1.9* 1.8*  1.8*    TELE  Sinus Tachy vs aflutter with rates in 130s. May be in and out of a/flutter.   ASSESSMENT AND PLAN   1. Atrial flutter with RVR.  - Tele showed sinus tachy with rates 130s along with  in&out of flutter. - Echo shows normal LV systolic function.  Inadequate images for wall motion assessment. EF 55-60%. - Dr. Meda Coffee started with plan to -5/15: increase amiodarone to 400 mg po BID for the next 3 days (today is day 3), then decrease to 400 mg po daily for 5 days, then to 200 mg po daily.  - With post HD return of Aflutter RVR -> Discontinue po amio. Will start load and infusion amio today and then switch to po amio  400mg  BID tomorrow? Tele is concerning for aflutter with rates in 130s. - Continue Cardizem 30mg  Q6hr and lopressor 12.5mg  BID He was switched to lopressor 12.5 mg BID from 25mg  BID yesterday due to hypotension.  2. Septic shock with bacteroides related to gallbladder disease - completed Abx. 3. Acute renal failure due to AKI/ATN, requiring HD this admission 4. Chronic polycythemia, now anemic. Hemoccult positive stool so not on full anticoagulation.  5. Profound deconditioning 6. Hypoalbuminemia 1.7   LOS: 21 days   Signed, Bhagat,Bhavinkumar PA-C    I have seen, examined and evaluated the patient this PM along with Mr. Curly [place], [place].  After reviewing all the available data and chart,  I agree with his findings, examination as well as impression recommendations.  Unfortunately, after maintaining excellent rate control on PO Amio yesterday, he reverted back to RVR overnight - after HD line placed & HD --? If triggered by either the procedure or HD via new line.  BP will not tolerate additional BB or CCB for Rate control -- Will rebolus IV Amiodarone (& continue 24 hr load if needed), then continue PO loading regimen as noted yesterday.   He is hemodynamically stable with no HF Sx despite RVR - can probably transfer to Telemetry until hi rate stabilizes.  It would appear that he may require LTAC for post-hospital care.  Hold off on full anticoagulation for now given recent Guaiac + stool.    Leonie Man, M.D., M.S. Interventional Cardiologist   Pager # [phone]

## 2014-12-18 NOTE — Progress Notes (Signed)
BP 82/59, HR 120s in a-fib. NP notified. Asked RN to notify cardiology. Prior to notifying cardiology, BP rose to 94/57. Will continue to monitor.   Lum Babe, RN

## 2014-12-18 NOTE — Progress Notes (Signed)
Subjective: Interval History: has complaints neck sore, not resting well.  Objective: Vital signs in last 24 hours: Temp:  [96.9 F (36.1 C)-98.5 F (36.9 C)] 97.7 F (36.5 C) (05/17 0700) Pulse Rate:  [63-134] 134 (05/17 0617) Resp:  [12-19] 18 (05/17 0617) BP: (82-115)/(47-93) 103/70 mmHg (05/17 0617) SpO2:  [95 %-100 %] 97 % (05/17 0617) Weight:  [108.8 kg (239 lb 13.8 oz)-113 kg (249 lb 1.9 oz)] 108.8 kg (239 lb 13.8 oz) (05/16 1900) Weight change:   Intake/Output from previous day: 05/16 0701 - 05/17 0700 In: 175 [P.O.:90] Out: 2125 [Urine:125] Intake/Output this shift: Total I/O In: -  Out: 30 [Drains:30]  General appearance: cooperative, moderately obese and pale Resp: diminished breath sounds bilaterally and rhonchi bibasilar Chest wall: IJ cath Cardio: rate 130s, reg now,   GI: obese, pos bs, liver down 5 cm, soft Extremities: edema 1+  Lab Results:  Recent Labs  12/17/14 0600 12/18/14 0218  WBC 17.7* 16.5*  HGB 7.9* 7.6*  HCT 24.2* 23.5*  PLT 166 108*   BMET:  Recent Labs  12/17/14 0600 12/18/14 0218  NA 135 135  K 4.6 4.0  CL 99* 98*  CO2 21* 22  GLUCOSE 104* 83  BUN 76* 49*  CREATININE 9.99* 7.33*  CALCIUM 8.1* 7.8*   No results for input(s): PTH in the last 72 hours. Iron Studies: No results for input(s): IRON, TIBC, TRANSFERRIN, FERRITIN in the last 72 hours.  Studies/Results: Ir Fluoro Guide Cv Line Left  12/17/2014   INDICATION: History of acute kidney injury (AKI). Patient needs hemodialysis. Temporary hemodialysis catheter was recently removed.  EXAM: FLUOROSCOPIC AND ULTRASOUND GUIDED PLACEMENT OF A TUNNELED DIALYSIS CATHETER  Physician: Stephan Minister. Henn, MD  FLUOROSCOPY TIME:  1 minutes, 17.2 mGy minutes  MEDICATIONS: Ancef 2 g. As antibiotic prophylaxis, Ancef was ordered pre-procedure and administered intravenously within one hour of incision.  1 mg versed, 25 mcg fentanyl. A radiology nurse monitored the patient for moderate sedation.   ANESTHESIA/SEDATION: Moderate sedation time: 42 minutes  PROCEDURE: Informed consent was obtained for placement of a tunneled dialysis catheter. The patient was placed supine on the interventional table. Ultrasound confirmed a patent right internal jugularvein. Ultrasound images were obtained for documentation. The right side of the neck was prepped and draped in a sterile fashion. The right side of the neck was anesthetized with 1% lidocaine. Maximal barrier sterile technique was utilized including caps, mask, sterile gowns, sterile gloves, sterile drape, hand hygiene and skin antiseptic. A small incision was made with #11 blade scalpel. A 21 gauge needle directed into the right internal jugular vein with ultrasound guidance. A micropuncture dilator set was placed. A 23 cm tip to cuff HemoSplit catheter was selected. The skin below the right clavicle was anesthetized and a small incision was made with an #11 blade scalpel. A subcutaneous tunnel was formed to the vein dermatotomy site. The catheter was brought through the tunnel. The vein dermatotomy site was dilated to accommodate a peel-away sheath. The catheter was placed through the peel-away sheath and directed into the central venous structures. The tip of the catheter was placed in the lower SVC with fluoroscopy. Fluoroscopic images were obtained for documentation. Both lumens were found to aspirate and flush well. The proper amount of heparin was flushed in both lumens. The vein dermatotomy site was closed using a single layer of absorbable suture and Dermabond. The catheter was secured to the skin using Prolene suture.  FINDINGS: Catheter tip in the SVC.  Estimated  blood loss: Minimal  COMPLICATIONS: None  IMPRESSION: Successful placement of a right jugular tunneled dialysis catheter using ultrasound and fluoroscopic guidance.   Electronically Signed   By: Markus Daft M.D.   On: 12/17/2014 14:00   Ir US Guide Vasc Access Right  12/17/2014   INDICATION:  History of acute kidney injury (AKI). Patient needs hemodialysis. Temporary hemodialysis catheter was recently removed.  EXAM: FLUOROSCOPIC AND ULTRASOUND GUIDED PLACEMENT OF A TUNNELED DIALYSIS CATHETER  Physician: Stephan Minister. Henn, MD  FLUOROSCOPY TIME:  1 minutes, 17.2 mGy minutes  MEDICATIONS: Ancef 2 g. As antibiotic prophylaxis, Ancef was ordered pre-procedure and administered intravenously within one hour of incision.  1 mg versed, 25 mcg fentanyl. A radiology nurse monitored the patient for moderate sedation.  ANESTHESIA/SEDATION: Moderate sedation time: 42 minutes  PROCEDURE: Informed consent was obtained for placement of a tunneled dialysis catheter. The patient was placed supine on the interventional table. Ultrasound confirmed a patent right internal jugularvein. Ultrasound images were obtained for documentation. The right side of the neck was prepped and draped in a sterile fashion. The right side of the neck was anesthetized with 1% lidocaine. Maximal barrier sterile technique was utilized including caps, mask, sterile gowns, sterile gloves, sterile drape, hand hygiene and skin antiseptic. A small incision was made with #11 blade scalpel. A 21 gauge needle directed into the right internal jugular vein with ultrasound guidance. A micropuncture dilator set was placed. A 23 cm tip to cuff HemoSplit catheter was selected. The skin below the right clavicle was anesthetized and a small incision was made with an #11 blade scalpel. A subcutaneous tunnel was formed to the vein dermatotomy site. The catheter was brought through the tunnel. The vein dermatotomy site was dilated to accommodate a peel-away sheath. The catheter was placed through the peel-away sheath and directed into the central venous structures. The tip of the catheter was placed in the lower SVC with fluoroscopy. Fluoroscopic images were obtained for documentation. Both lumens were found to aspirate and flush well. The proper amount of heparin was  flushed in both lumens. The vein dermatotomy site was closed using a single layer of absorbable suture and Dermabond. The catheter was secured to the skin using Prolene suture.  FINDINGS: Catheter tip in the SVC.  Estimated blood loss: Minimal  COMPLICATIONS: None  IMPRESSION: Successful placement of a right jugular tunneled dialysis catheter using ultrasound and fluoroscopic guidance.   Electronically Signed   By: Markus Daft M.D.   On: 12/17/2014 14:00    I have reviewed the patient's current medications.  Assessment/Plan: 1  AKI sepsis, now almost 4 wk into course. Oliguric.  HD dependent.  BP low may be factor 2 low bp with [name] HR 3 Aflutter 4 Obesity 5 COPD 6 Anemia  On epo, check Fe 7 Cholecystitis 8 pneu P HD in am, 4 k, check Fe.  Amio.  ?need iv.  On cardiazem and metop.  But limited by bp.    LOS: 21 days   Allen Green L 12/18/2014,8:42 AM

## 2014-12-18 NOTE — Progress Notes (Signed)
HR sustaining in 130s around 0514, appears to ST or 2:1 a-flutter, BP 113/52. 30 mg of PO cardizem given per scheduled order. About an hour later HR still maintaining in the 130s, BP 103/70. Eulas Post, [place] with cardiology, notified. New order received. Will continue to monitor.   Lum Babe, RN

## 2014-12-18 NOTE — Progress Notes (Signed)
Physical Therapy Treatment Patient Details Name: Allen Green MRN: [id number] DOB: 1951/03/28 Today's Date: 12/18/2014    History of Present Illness Pt is a 64 y/o male admitted on 4/26 with septic shock and ARF due to cholecystitis and an abscess associated with a recently diagnosed gallbladder drain. Course complicated by bibasal HCAP with severe hypoxemia. Pt intubated 4/26-5/5.    PT Comments    Due to stomach upset, was unable to encourage pt to get OOB for standing/gait, So emphasized there ex.  Follow Up Recommendations  SNF     Equipment Recommendations  Other (comment) (TBA)    Recommendations for Other Services       Precautions / Restrictions Precautions Precaution Comments: Pt has a left "broken knee replacement". Original TKA was in 1995 and began giving him problems in 2005.    Mobility  Bed Mobility               General bed mobility comments: pt refused OOB  Transfers                    Ambulation/Gait                 Stairs            Wheelchair Mobility    Modified Rankin (Stroke Patients Only)       Balance                                    Cognition Arousal/Alertness: Awake/alert Behavior During Therapy: Flat affect Overall Cognitive Status: Within Functional Limits for tasks assessed                      Exercises General Exercises - Lower Extremity Hip ABduction/ADduction: AROM;Both;15 reps;Supine Straight Leg Raises: AROM;AAROM;Both;10 reps;Supine Hip Flexion/Marching: AROM;Both;10 reps;Supine (heavy resistance) Other Exercises Other Exercises: resisted bicep/tricep press x10 reps    General Comments        Pertinent Vitals/Pain Pain Assessment: No/denies pain    Home Living                      Prior Function            PT Goals (current goals can now be found in the care plan section) Acute Rehab PT Goals Patient Stated Goal: Return home PT Goal Formulation:  With patient/family Time For Goal Achievement: 12/24/14 Potential to Achieve Goals: Good Progress towards PT goals: Not progressing toward goals - comment (limited participation)    Frequency  Min 3X/week    PT Plan Current plan remains appropriate    Co-evaluation             End of Session   Activity Tolerance: Other (comment) (pt self limiting, but agreed to exercise) Patient left: in bed;with call bell/phone within reach;with family/visitor present     Time: [id number] PT Time Calculation (min) (ACUTE ONLY): 32 min  Charges:  $Therapeutic Exercise: 23-37 mins                    G Codes:      Konner Saiz, Tessie Fass 12/18/2014, 5:13 PM  12/18/2014  Donnella Sham, [place] [phone]  (pager)

## 2014-12-18 NOTE — Progress Notes (Signed)
Called by RN CM to discuss pt's rehab venue options with pt's wife after discussion with [facility] representative. I met with wife, RN, and RN CM outside of pt's room. I reiterated to wife that pt was not yet at a functional level to be able to participate in inpt rehab program as I have discussed also with her on previous visits. Therefore, inpt rehab admission is not an option today. She is visibly upset with the option of pt discharging to [facility]. She was encouraged to do an onsite visit. I will continue to follow while pt remains inhouse. [phone]

## 2014-12-18 NOTE — Progress Notes (Addendum)
[place] TEAM 1 - Stepdown/ICU TEAM Progress Note  Allen Green HGD:[id number] DOB: 06-Dec-1950 DOA: 11/24/2014 PCP: Lovett Calender, AMY, PA  Admit HPI / Brief Narrative: 64 y/o WM PMHx  COPD (2 L O2 when sedentary, 3 L O2 when active), chronic pain syndrome, secondary polycythemia, hepatitis B (40 years ago). Admitted on 4/26 with septic shock and acute respiratory failure due to cholecystitis and an abscess associated with a recently dislodged gallbladder drain. Course complicated by bibasilar HCAP with severe hypoxemia and severe auto PEEP.  HPI/Subjective: 5/17 A/O 4, patient states that he has no desire to go to a LTAC or rehabilitation facility. States has some postprandial abdominal pain.   Assessment/Plan: Acute on chronic respiratory failure with hypercapnea -Patient improving, on 3 L O2 via [place] to maintain SPO2 high 90s  -Decrease O2 to 2 L which is patient's home regimen; maintain SPO2 89-93%   Septic shock due to Bacteroides/ -5/12 Patient gallbladder fluid positive for coag negative staph, however leukocytosis resolving, negative left shift, negative fever will hold on any additional antibiotics at this time. Appears drainage of peri-gallbladder abscess has resolved issue. -Monitor closely  Acute Cholecystis with peri-gallbladder abscess due to dislodged percutaneous drain  -s/p drain 4/26 per IR - prior drain 4/3 > 4/12  Leukocytosis -Remains high but trending down  -All cultures negative to date except for fluid from gallbladder abscess. -PCXR in a.m.   Thrombocythemia -Will continue heparin for now however obtain HIT panel  Acute kidney failure requiring dialysis -Continues to be Borderline hypotensive  -Continue Midodrine 10mg  TID - nephrology following  - urine output has dropped off.   -Patient now dialysis dependent. -Strict in and out since admission; +1L   Atrial flutter w/ RVR -CHADSVASC = 2 - care per Cardiology - rate not well controlled, after placement  of HD cath yesterday patient went back into A. fib with RVR. - Amiodarone drip restarted on 5/16, rate still not well-controlled -Continue Cardizem 30 mg QID -Cardiology wishes to hold on restarting Coumadin, FOBT stool and if negative restart in a.m.   Severe COPD Tobacco abuse  -Counseled on absolute need to discontinue tobacco abuse permanently  BLL HCAP -PCXR in the a.m.  Chronic polycythemia due to COPD/hypoxia - now anemic -Due to critical illness and acute kidney failure - no IV iron due to bacteremia - erythropoietin per nephrology  Thrush -Continue Nystatin swish and spit QID   Profound deconditioned state -For eventual SNF vs LTACH PLACEMENT; PATIENT DOES NOT QUALIFY FOR CIR placement  Hepatitis B core antibody positive -This isolated positive result is unclear and could represent a resolved infection, false positive test, or even a chronic low-grade infection  - will need to be followed as outpatient  - follow-up LFTs in a.m.  Ileus -Clinically resolved  Failure to thrive in adult -Continue Megace 800 mg daily   Code Status: FULL Family Communication: Wife present at time of exam Disposition Plan: CIR vs LTACH in a.m.?    Consultants: Dr.Jayadeep Hassell Done (cardiology) Dr.Rakesh Elsworth [facility]) Dr.Kellie Moshe Cipro (nephrology)   Procedure/Significant Events: 4/26 TTE > poor study, can't comment on LV or RV size/function 4/26 admitted - intubated, gallbladder drain placed by IR 4/29 multiple amio boluses for RVR 4/30 worsening hypoxia 5/3 improving pressor needs 5/4 SBTs 5/5 extubated 5/6 off CVVHD 5/8 transfer to [facility] for intermittent HD 5/11 TRH assumes care 5/16 IR guided Placement of right jugular Perm Cath  Culture 5/12 blood right hand/left arm NGTD  5/12 urine negative  5/12 gallbladder positive coag negative staph 5/17 C. difficile pending  Antibiotics: NA  DVT prophylaxis: Subcutaneous heparin   Devices    LINES / TUBES:       Continuous Infusions: . amiodarone 60 mg/hr (12/18/14 1257)   Followed by  . amiodarone      Objective: VITAL SIGNS: Temp: 98.2 F (36.8 C) (05/17 1100) Temp Source: Oral (05/17 1100) BP: 105/56 mmHg (05/17 1300) Pulse Rate: 138 (05/17 1300) SPO2; FIO2:   Intake/Output Summary (Last 24 hours) at 12/18/14 1651 Last data filed at 12/18/14 1230  Gross per 24 hour  Intake    720 ml  Output   2110 ml  Net  -1390 ml     Exam: General: A/O 4,No acute respiratory distress Lungs: coarse breath sounds bilateral, without wheezes or crackles Cardiovascular: Irregular irregular rhythm and rate, without murmur gallop or rub normal S1 and S2 Abdomen: RUQ tenderness to palpation, nondistended, soft, bowel sounds positive, no rebound, no ascites, no appreciable mass Extremities: No significant cyanosis, clubbing, or edema bilateral lower extremities   Data Reviewed: Basic Metabolic Panel:  Recent Labs Lab 12/13/14 1205 12/14/14 0500 12/15/14 0458 12/16/14 0300 12/17/14 0600 12/18/14 0218  NA  --  136 133* 136 135 135  K  --  3.8 3.8 4.5 4.6 4.0  CL  --  99* 98* 98* 99* 98*  CO2  --  25 23 22  21* 22  GLUCOSE  --  108* 107* 99 104* 83  BUN  --  52* 64* 63* 76* 49*  CREATININE  --  6.34* 8.04* 8.23* 9.99* 7.33*  CALCIUM  --  7.8* 7.9* 8.1* 8.1* 7.8*  MG 2.2 2.0  --   --   --   --   PHOS  --  5.7* 5.8* 6.6* 8.0* 5.6*   Liver Function Tests:  Recent Labs Lab 12/13/14 0505 12/14/14 0500 12/15/14 0458 12/16/14 0300 12/17/14 0600 12/18/14 0218  AST 21 18  --   --   --  14*  ALT 16* 15*  --   --   --  11*  ALKPHOS 94 88  --   --   --  72  BILITOT 0.7 0.8  --   --   --  0.5  PROT 5.7* 6.0*  --   --   --  6.4*  ALBUMIN 1.9*  1.9* 1.9* 1.8* 1.9* 1.9* 1.8*  1.8*   No results for input(s): LIPASE, AMYLASE in the last 168 hours. No results for input(s): AMMONIA in the last 168 hours. CBC:  Recent Labs Lab 12/13/14 1205 12/15/14 0500 12/16/14 0300  12/17/14 0600 12/18/14 0218  WBC 19.3* 26.3* 24.4* 17.7* 16.5*  NEUTROABS  --   --   --  14.2*  --   HGB 9.1* 8.2* 7.9* 7.9* 7.6*  HCT 28.4* 24.9* 24.7* 24.2* 23.5*  MCV 94.0 91.2 92.2 92.7 92.5  PLT 259 141* 152 166 108*   Cardiac Enzymes:  Recent Labs Lab 12/11/14 1940 12/11/14 2330 12/12/14 0525  TROPONINI <0.03 <0.03 <0.03   BNP (last 3 results)  Recent Labs  09/16/14 1616 11/27/14 0320  BNP 34.7 31.0    ProBNP (last 3 results) No results for input(s): PROBNP in the last 8760 hours.  CBG: No results for input(s): GLUCAP in the last 168 hours.  Recent Results (from the past 240 hour(s))  Clostridium Difficile by PCR     Status: None   Collection Time: 12/12/14 12:35 AM  Result Value Ref Range Status  C difficile by pcr NEGATIVE NEGATIVE Final  Culture, Urine     Status: None   Collection Time: 12/13/14 11:12 AM  Result Value Ref Range Status   Specimen Description URINE, CATHETERIZED  Final   Special Requests NONE  Final   Colony Count NO GROWTH Performed at [facility]   Final   Culture NO GROWTH Performed at [facility]   Final   Report Status 12/14/2014 FINAL  Final  Culture, blood (routine x 2)     Status: None (Preliminary result)   Collection Time: 12/13/14 11:55 AM  Result Value Ref Range Status   Specimen Description BLOOD RIGHT HAND  Final   Special Requests AEB 3CC  Final   Culture   Final           BLOOD CULTURE RECEIVED NO GROWTH TO DATE CULTURE WILL BE HELD FOR 5 DAYS BEFORE ISSUING A FINAL NEGATIVE REPORT Performed at [facility]    Report Status PENDING  Incomplete  Culture, blood (routine x 2)     Status: None (Preliminary result)   Collection Time: 12/13/14 12:05 PM  Result Value Ref Range Status   Specimen Description BLOOD LEFT ARM  Final   Special Requests BAA 10CC  Final   Culture   Final           BLOOD CULTURE RECEIVED NO GROWTH TO DATE CULTURE WILL BE HELD FOR 5 DAYS BEFORE ISSUING A FINAL  NEGATIVE REPORT Performed at [facility]    Report Status PENDING  Incomplete  Body fluid culture     Status: None   Collection Time: 12/13/14 11:05 PM  Result Value Ref Range Status   Specimen Description FLUID  Final   Special Requests GALL BLADDER  Final   Gram Stain   Final    NO WBC SEEN NO ORGANISMS SEEN Performed at [facility]    Culture   Final    FEW STAPHYLOCOCCUS SPECIES (COAGULASE NEGATIVE) Note: RIFAMPIN AND GENTAMICIN SHOULD NOT BE USED AS SINGLE DRUGS FOR TREATMENT OF STAPH INFECTIONS. Performed at [facility]    Report Status 12/18/2014 FINAL  Final   Organism ID, Bacteria STAPHYLOCOCCUS SPECIES (COAGULASE NEGATIVE)  Final      Susceptibility   Staphylococcus species (coagulase negative) - MIC*    CLINDAMYCIN >=8 RESISTANT Resistant     ERYTHROMYCIN >=8 RESISTANT Resistant     GENTAMICIN <=0.5 SENSITIVE Sensitive     LEVOFLOXACIN >=8 RESISTANT Resistant     OXACILLIN >=4 RESISTANT Resistant     PENICILLIN >=0.5 RESISTANT Resistant     RIFAMPIN <=0.5 SENSITIVE Sensitive     TRIMETH/SULFA <=10 SENSITIVE Sensitive     VANCOMYCIN 1 SENSITIVE Sensitive     TETRACYCLINE 2 SENSITIVE Sensitive     * FEW STAPHYLOCOCCUS SPECIES (COAGULASE NEGATIVE)     Studies:  Recent x-ray studies have been reviewed in detail by the Attending Physician  Scheduled Meds:  Scheduled Meds: . antiseptic oral rinse  7 mL Mouth Rinse BID  . arformoterol  15 mcg Nebulization BID  . budesonide (PULMICORT) nebulizer solution  0.5 mg Nebulization BID  . darbepoetin (ARANESP) injection - NON-DIALYSIS  200 mcg Subcutaneous Q Mon-1800  . diltiazem  30 mg Oral 4 times per day  . famotidine  20 mg Oral Daily  . feeding supplement (NEPRO CARB STEADY)  237 mL Oral BID BM  . feeding supplement (RESOURCE BREEZE)  1 Container Oral Q1500  . heparin subcutaneous  5,000 Units  Subcutaneous 3 times per day  . ipratropium  0.5 mg Nebulization TID  . levalbuterol  0.63 mg  Nebulization TID  . megestrol  800 mg Oral Daily  . midodrine  10 mg Oral TID WC  . nystatin  5 mL Oral QID    Time spent on care of this patient: 40 mins   WOODS, Geraldo Docker , MD  [facility] Office  [phone] Pager - [phone]  On-Call/Text Page:      [email or web address]      password [email or web address]  If 7PM-7AM, please contact night-coverage [email or web address] Password [email or web address] 12/18/2014, 4:51 PM   LOS: 21 days   Care during the described time interval was provided by me .  I have reviewed this patient's available data, including medical history, events of note, physical examination, radiology studies and test results as part of my evaluation  Dia Crawford, MD [phone] Pager

## 2014-12-19 ENCOUNTER — Inpatient Hospital Stay (HOSPITAL_COMMUNITY): Payer: Medicare Other

## 2014-12-19 DIAGNOSIS — I483 Typical atrial flutter: Secondary | ICD-10-CM

## 2014-12-19 DIAGNOSIS — J9622 Acute and chronic respiratory failure with hypercapnia: Secondary | ICD-10-CM

## 2014-12-19 LAB — RENAL FUNCTION PANEL
ALBUMIN: 1.8 g/dL — AB (ref 3.5–5.0)
ANION GAP: 14 (ref 5–15)
Albumin: 1.7 g/dL — ABNORMAL LOW (ref 3.5–5.0)
Anion gap: 13 (ref 5–15)
BUN: 57 mg/dL — AB (ref 6–20)
BUN: 60 mg/dL — AB (ref 6–20)
CALCIUM: 8.1 mg/dL — AB (ref 8.9–10.3)
CALCIUM: 7.9 mg/dL — AB (ref 8.9–10.3)
CHLORIDE: 99 mmol/L — AB (ref 101–111)
CO2: 23 mmol/L (ref 22–32)
CO2: 22 mmol/L (ref 22–32)
CREATININE: 9.01 mg/dL — AB (ref 0.61–1.24)
Chloride: 99 mmol/L — ABNORMAL LOW (ref 101–111)
Creatinine, Ser: 8.95 mg/dL — ABNORMAL HIGH (ref 0.61–1.24)
GFR calc Af Amer: 6 mL/min — ABNORMAL LOW (ref >60)
GFR calc Af Amer: 6 mL/min — ABNORMAL LOW (ref >60)
GFR calc non Af Amer: 5 mL/min — ABNORMAL LOW (ref >60)
GFR calc non Af Amer: 6 mL/min — ABNORMAL LOW (ref >60)
Glucose, Bld: 94 mg/dL (ref 65–99)
Glucose, Bld: 89 mg/dL (ref 65–99)
PHOSPHORUS: 6 mg/dL — AB (ref 2.5–4.6)
POTASSIUM: 4 mmol/L (ref 3.5–5.1)
Phosphorus: 6.1 mg/dL — ABNORMAL HIGH (ref 2.5–4.6)
Potassium: 4 mmol/L (ref 3.5–5.1)
SODIUM: 135 mmol/L (ref 135–145)
Sodium: 135 mmol/L (ref 135–145)

## 2014-12-19 LAB — CULTURE, BLOOD (ROUTINE X 2)
CULTURE: NO GROWTH
Culture: NO GROWTH

## 2014-12-19 MED ORDER — ALTEPLASE 2 MG IJ SOLR
2.0000 mg | Freq: Once | INTRAMUSCULAR | Status: DC | PRN
Start: 2014-12-19 — End: 2014-12-19

## 2014-12-19 MED ORDER — SODIUM CHLORIDE 0.9 % IV SOLN
510.0000 mg | INTRAVENOUS | Status: DC
  Filled 2014-12-19: qty 17

## 2014-12-19 MED ORDER — HEPARIN SODIUM (PORCINE) 1000 UNIT/ML DIALYSIS: 1000.0000 [IU] | INTRAMUSCULAR | Status: DC | PRN

## 2014-12-19 MED ORDER — NEPRO/CARBSTEADY PO LIQD
237.0000 mL | ORAL | Status: DC | PRN
  Filled 2014-12-19: qty 237

## 2014-12-19 MED ORDER — SODIUM CHLORIDE 0.9 % IV SOLN: 100.0000 mL | INTRAVENOUS | Status: DC | PRN

## 2014-12-19 MED ORDER — NEPRO/CARBSTEADY PO LIQD: 237.0000 mL | ORAL | Status: DC | PRN

## 2014-12-19 MED ORDER — FAMOTIDINE 20 MG PO TABS: 20.0000 mg | ORAL_TABLET | Freq: Two times a day (BID) | ORAL | Status: DC

## 2014-12-19 MED ORDER — HEPARIN SODIUM (PORCINE) 1000 UNIT/ML DIALYSIS: 40.0000 [IU]/kg | Freq: Once | INTRAMUSCULAR | Status: DC

## 2014-12-19 MED ORDER — PENTAFLUOROPROP-TETRAFLUOROETH EX AERO: 1.0000 "application " | INHALATION_SPRAY | CUTANEOUS | Status: DC | PRN

## 2014-12-19 MED ORDER — LIDOCAINE-PRILOCAINE 2.5-2.5 % EX CREA
1.0000 "application " | TOPICAL_CREAM | CUTANEOUS | Status: DC | PRN
  Filled 2014-12-19: qty 5

## 2014-12-19 MED ORDER — ALTEPLASE 2 MG IJ SOLR
2.0000 mg | Freq: Once | INTRAMUSCULAR | Status: DC | PRN
  Filled 2014-12-19: qty 2

## 2014-12-19 MED ORDER — LIDOCAINE-PRILOCAINE 2.5-2.5 % EX CREA: 1.0000 "application " | TOPICAL_CREAM | CUTANEOUS | Status: DC | PRN

## 2014-12-19 MED ORDER — SODIUM CHLORIDE 0.9 % IV BOLUS (SEPSIS)
500.0000 mL | Freq: Once | INTRAVENOUS | Status: AC
  Administered 2014-12-19: 500 mL via INTRAVENOUS

## 2014-12-19 MED ORDER — LIDOCAINE HCL (PF) 1 % IJ SOLN: 5.0000 mL | INTRAMUSCULAR | Status: DC | PRN

## 2014-12-19 MED ORDER — HEPARIN SODIUM (PORCINE) 1000 UNIT/ML DIALYSIS: 100.0000 [IU]/kg | INTRAMUSCULAR | Status: DC | PRN

## 2014-12-19 MED FILL — Medication: Qty: 1 | Status: AC

## 2014-12-21 LAB — HEPARIN INDUCED THROMBOCYTOPENIA PNL: Heparin Induced Plt Ab: 2.772 OD — ABNORMAL HIGH (ref 0.000–0.400)

## 2014-12-21 LAB — SEROTONIN RELEASE ASSAY (SRA)
SRA .2 IU/mL UFH Ser-aCnc: 70 % — ABNORMAL HIGH (ref 0–20)
SRA 100IU/mL UFH Ser-aCnc: 1 % (ref 0–20)

## 2015-01-02 NOTE — Progress Notes (Addendum)
     2014-12-26 1100  Clinical Encounter Type  Visited With Family;Health care provider  Visit Type Follow-up;Spiritual support;Social support;Death    Chaplain was paged and notified that the family had arrived to the hospital. Patient's family had a positive pastoral relationship with one of the spiritual care department's contract chaplains. Family requested this particular contract chaplain for support. Chaplain tried to [employer] but she is unavailable. Chaplain was present while physicians consulted with patient's family. Patient's family was devastated but they also have a lot of anger and questioned the quality of care the patient was receiving. Another staff chaplain is currently providing care for family.  Gar Ponto, Chaplain  11:48 AM

## 2015-01-02 NOTE — Progress Notes (Signed)
Family in conference room with MD/chaplain/ nursing 3W administration; pt returned to pt room for visitation with family; family screaming/crying/appearing distraught and angry; will continue to provide support

## 2015-01-02 NOTE — Code Documentation (Signed)
CODE BLUE NOTE  Patient Name: Allen Green   MRN: [id number]   Date of Birth/ Sex: 1950-08-10 , male      Admission Date: 11/03/2014  Attending Provider: Debbe Odea, MD  Primary Diagnosis: trouble breathing knot on right side     Indication: Pt was in his usual state of health until this AM, when he was in the dialysis unit and became unresponsive. Code blue was subsequently called. At the time of arrival on scene, ACLS protocol was underway. The patient was noted to be pulseless and with PEA. He did intermittently have coarse ventricular fibrillation which was shocked with no ROSC. He returned to PEA throughout the rest of the code. Multiple rounds of epinephrine were given. Sodium Bicarb was given. Amiodarone was also given in addition to his amiodarone drip as a last resort. All potential reversible causes were reviewed without a reversible etiology identified. The code was subsequently called and the patient was pronounced dead. Upon review, it appeared that the patient, who had been in and out of atrial flutter during his hospitalization, went into PEA, vomited, and became unresponsive and pulseless. ROSC was never achieved.    Technical Description:  - CPR performance duration:  18  minutes  - Was defibrillation or cardioversion used? Yes   - Was external pacer placed? No  - Was patient intubated pre/post CPR? Yes    Medications Administered: Y = Yes; Blank = No Amiodarone  Y  Atropine    Calcium    Epinephrine  Y  Lidocaine    Magnesium    Norepinephrine    Phenylephrine    Sodium bicarbonate  Y  Vasopressin    Other     Post CPR evaluation:  - Final Status - Was patient successfully resuscitated ? No   Miscellaneous Information:  - Time of death:  09:48 AM  - Primary team notified?  Yes  - Family Notified? Yes        Aquilla Hacker, MD   January 02, 2015, 9:58 AM

## 2015-01-02 NOTE — Progress Notes (Signed)
I Responded to a referral from chaplain colleague to continue support to family of deceased patient. Family was very angry and deeply concern regarding the care that Mr. Tristan received at [facility]. I worked with family and  staff to promote information sharing and provided a framework for family to express and deal with their feelings.This sudden situation was overwhelming to family and created intense feelings such as helplessness, anger, blame and  loss of control. I explored with family their understandings of God and assisted them in reinforcing their faith in the face of this difficult and sudden death. Per family request I prayed with them several times, listened empathetically as they began to move toward place that is meaningful and  helpful in this current situation. Provided hospitality, ministry of presence, guidance and grief support.  I remained with the family until their departure.   12/25/2014 1500  Clinical Encounter Type  Visited With Family;Health care provider  Visit Type Initial;Spiritual support;Death  Referral From Chaplain;Nurse  Spiritual Encounters  Spiritual Needs Prayer;Emotional;Grief support  Stress Factors  Family Stress Factors Exhausted;Loss  Jaclynn Major, B and E

## 2015-01-02 NOTE — Progress Notes (Signed)
Subjective: Interval History: has complaints Nausea.  Poor appetiete.  Objective: Vital signs in last 24 hours: Temp:  [97.6 F (36.4 C)-98.5 F (36.9 C)] 97.6 F (36.4 C) (05/18 0807) Pulse Rate:  [128-138] 128 (05/18 0427) Resp:  [13-22] 18 (05/18 0427) BP: (85-119)/(49-71) 105/58 mmHg (05/18 0644) SpO2:  [93 %-100 %] 100 % (05/18 0739) Weight change:   Intake/Output from previous day: 05/17 0701 - 05/18 0700 In: 840 [P.O.:840] Out: 50 [Drains:50] Intake/Output this shift:    General appearance: cooperative, moderately obese, pale and slowed mentation Resp: diminished breath sounds bilaterally and rhonchi bibasilar Chest wall: RIJ cath Cardio: S1, S2 normal and systolic murmur: holosystolic 2/6, blowing at apex GI: drain R mid abdm. , pos bs,liver down 5 cm. mildly tender Extremities: edema 1+  Lab Results:  Recent Labs  12/17/14 0600 12/18/14 0218  WBC 17.7* 16.5*  HGB 7.9* 7.6*  HCT 24.2* 23.5*  PLT 166 108*   BMET:  Recent Labs  12/18/14 0218 01/09/15 0350  NA 135 135  K 4.0 4.0  CL 98* 99*  CO2 22 23  GLUCOSE 83 94  BUN 49* 57*  CREATININE 7.33* 9.01*  CALCIUM 7.8* 8.1*   No results for input(s): PTH in the last 72 hours. Iron Studies:  Recent Labs  12/18/14 0935  IRON 14*  TIBC 192*    Studies/Results: Ir Fluoro Guide Cv Line Left  12/17/2014   INDICATION: History of acute kidney injury (AKI). Patient needs hemodialysis. Temporary hemodialysis catheter was recently removed.  EXAM: FLUOROSCOPIC AND ULTRASOUND GUIDED PLACEMENT OF A TUNNELED DIALYSIS CATHETER  Physician: Stephan Minister. Henn, MD  FLUOROSCOPY TIME:  1 minutes, 17.2 mGy minutes  MEDICATIONS: Ancef 2 g. As antibiotic prophylaxis, Ancef was ordered pre-procedure and administered intravenously within one hour of incision.  1 mg versed, 25 mcg fentanyl. A radiology nurse monitored the patient for moderate sedation.  ANESTHESIA/SEDATION: Moderate sedation time: 42 minutes  PROCEDURE: Informed  consent was obtained for placement of a tunneled dialysis catheter. The patient was placed supine on the interventional table. Ultrasound confirmed a patent right internal jugularvein. Ultrasound images were obtained for documentation. The right side of the neck was prepped and draped in a sterile fashion. The right side of the neck was anesthetized with 1% lidocaine. Maximal barrier sterile technique was utilized including caps, mask, sterile gowns, sterile gloves, sterile drape, hand hygiene and skin antiseptic. A small incision was made with #11 blade scalpel. A 21 gauge needle directed into the right internal jugular vein with ultrasound guidance. A micropuncture dilator set was placed. A 23 cm tip to cuff HemoSplit catheter was selected. The skin below the right clavicle was anesthetized and a small incision was made with an #11 blade scalpel. A subcutaneous tunnel was formed to the vein dermatotomy site. The catheter was brought through the tunnel. The vein dermatotomy site was dilated to accommodate a peel-away sheath. The catheter was placed through the peel-away sheath and directed into the central venous structures. The tip of the catheter was placed in the lower SVC with fluoroscopy. Fluoroscopic images were obtained for documentation. Both lumens were found to aspirate and flush well. The proper amount of heparin was flushed in both lumens. The vein dermatotomy site was closed using a single layer of absorbable suture and Dermabond. The catheter was secured to the skin using Prolene suture.  FINDINGS: Catheter tip in the SVC.  Estimated blood loss: Minimal  COMPLICATIONS: None  IMPRESSION: Successful placement of a right jugular tunneled dialysis  catheter using ultrasound and fluoroscopic guidance.   Electronically Signed   By: Markus Daft M.D.   On: 12/17/2014 14:00   Ir US Guide Vasc Access Right  12/17/2014   INDICATION: History of acute kidney injury (AKI). Patient needs hemodialysis. Temporary  hemodialysis catheter was recently removed.  EXAM: FLUOROSCOPIC AND ULTRASOUND GUIDED PLACEMENT OF A TUNNELED DIALYSIS CATHETER  Physician: Stephan Minister. Henn, MD  FLUOROSCOPY TIME:  1 minutes, 17.2 mGy minutes  MEDICATIONS: Ancef 2 g. As antibiotic prophylaxis, Ancef was ordered pre-procedure and administered intravenously within one hour of incision.  1 mg versed, 25 mcg fentanyl. A radiology nurse monitored the patient for moderate sedation.  ANESTHESIA/SEDATION: Moderate sedation time: 42 minutes  PROCEDURE: Informed consent was obtained for placement of a tunneled dialysis catheter. The patient was placed supine on the interventional table. Ultrasound confirmed a patent right internal jugularvein. Ultrasound images were obtained for documentation. The right side of the neck was prepped and draped in a sterile fashion. The right side of the neck was anesthetized with 1% lidocaine. Maximal barrier sterile technique was utilized including caps, mask, sterile gowns, sterile gloves, sterile drape, hand hygiene and skin antiseptic. A small incision was made with #11 blade scalpel. A 21 gauge needle directed into the right internal jugular vein with ultrasound guidance. A micropuncture dilator set was placed. A 23 cm tip to cuff HemoSplit catheter was selected. The skin below the right clavicle was anesthetized and a small incision was made with an #11 blade scalpel. A subcutaneous tunnel was formed to the vein dermatotomy site. The catheter was brought through the tunnel. The vein dermatotomy site was dilated to accommodate a peel-away sheath. The catheter was placed through the peel-away sheath and directed into the central venous structures. The tip of the catheter was placed in the lower SVC with fluoroscopy. Fluoroscopic images were obtained for documentation. Both lumens were found to aspirate and flush well. The proper amount of heparin was flushed in both lumens. The vein dermatotomy site was closed using a single  layer of absorbable suture and Dermabond. The catheter was secured to the skin using Prolene suture.  FINDINGS: Catheter tip in the SVC.  Estimated blood loss: Minimal  COMPLICATIONS: None  IMPRESSION: Successful placement of a right jugular tunneled dialysis catheter using ultrasound and fluoroscopic guidance.   Electronically Signed   By: Markus Daft M.D.   On: 12/17/2014 14:00   Dg Chest Port 1 View  January 13, 2015   CLINICAL DATA:  64 year old male with a history of aspiration pneumonia.  EXAM: PORTABLE CHEST - 1 VIEW  COMPARISON:  12/13/2014, 12/07/2014  FINDINGS: Cardiomediastinal silhouette unchanged, partially obscured by overlying lung/pleural disease.  Fullness in the hilar regions.  Low lung volumes.  Worsening right basilar airspace and interstitial opacities. Persisting left basilar interstitial opacities.  Interval conversion of right IJ central line for a right IJ tunneled split tip hemodialysis catheter. Catheter appears to terminate in the superior vena cava.  IMPRESSION: Bilateral, right greater than left airspace and interstitial opacities, potentially a combination of edema, atelectasis, and/ or consolidation.  Interval conversion of right IJ central catheter to a tunneled hemodialysis catheter, appearing to terminate in the superior vena cava.  Signed,  Dulcy Fanny. Earleen Newport, DO  [facility]  [facility]   Electronically Signed   By: Corrie Mckusick D.O.   On: 2015/01/13 07:58    I have reviewed the patient's current medications.  Assessment/Plan: 1  AKI presumably nonresolving ATN.  Oliguric,  will need to record all urine.  For HD as rapid rise solute, mild acidemia 2 Anemia Fe low bolus 3 lower ptlt stop Hep,  4 Nutrition   Push suppl 5 COPD 6 Aflutter per cards  Amio, cardiazem, metop 7  Debill P HD.  Fe, nutrition.  ^ pepcid  LOS: 22 days   Javon Hupfer L 2015/01/05,8:28 AM

## 2015-01-02 NOTE — Procedures (Signed)
I was present at this session.  I have reviewed the session itself and made appropriate changes.  Hd Via R IJ PC. bp low 100s, even.  Jemmie Ledgerwood L 05-24-168:53 AM

## 2015-01-02 NOTE — Progress Notes (Signed)
   01-16-2015 1000  Clinical Encounter Type  Visited With Patient  Visit Type Initial;Spiritual support;Social support;Code   Chaplain responded to a code blue in Hemodialysis at around 9:30 AM. Medical team was working with patient when chaplain arrived. Patient has since passed away. Patient's physician has contacted family and intends to meet with them in person when they arrive. Chaplain will follow up with family and see if they need grief support.  Garnie Borchardt, Claudius Sis, Chaplain  10:10 AM

## 2015-01-02 NOTE — Progress Notes (Addendum)
Pt alert/oriented slowed mentation.,pale looking 64yr old male started on hemodialysis approximated at 0903am in bay#t5 at HD unit. On amiodarone drip 16.69ml/hr,oxygen at 3l/min via Mayer sat at 100%, denies any pain/discomfort at the time of dialysis, No nausea and vomiting noted. In bed with wheels locked, and call bell within reach. Approximated at 925 am pt c/o not feeling well, vomited food particles approximated 258ml and suddenly became unresponsive, and pulseless,rinsed back HD lines.at the same time CPR initiated ,nephrologist Dr. Jonnie Finner at bedside, and code blue was subsequently called. ACLS protocol underway. And pt expired and pronounced at 0948. DON aware of event. Post mortem done and reports given to primary nurse Chris,RN

## 2015-01-02 NOTE — Progress Notes (Signed)
Received call from hemodialysis that pt suffered cardiac arrest; ACLS/code blue in progress; no family in pt room; I was told family was called by MD and made aware of significant decline in pt condition; will prepare for pt family arrival

## 2015-01-02 NOTE — Progress Notes (Signed)
Post mortem care completed; family escorted by chaplain from hospital; pt transferred into care of funeral home per protocol with security officer; pt belongings given to family at bedside

## 2015-01-02 NOTE — Discharge Summary (Signed)
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 Prior to me evaluating him, he unfortunately developed a cardiac arrest while in dialysis. I was called to be alerted that [name] had been initiated. By the time I arrived to evaluate the patient, resuscitative efforts had been ongoing for close to 15 minutes. His was pulseless with PEA. There was some suspicion of coarse V. fib for which she received a shock. Apparently he had also vomited prior to or in the first initial minutes after becoming unresponsive. After close to 20 minutes of ACLS, the code was ended. Time of death was 9:48 AM. I spoke with the family including the patient's wife and daughter in detail in regards to the patient's death.  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 The patient presented to the hospital on 4/26 with right upper quadrant pain. His cholecystostomy tube had been dislodged over a week prior to presentation. The patient was found to be in septic shock with acute respiratory failure secondary to cholecystitis and and  gallbladder tract abscess. He was also noted to have bilateral pneumonia/ HCAP with severe hypoxemia and hypercapnia. He was intubated on the day of admission and a gallbladder drain was placed by interventional radiology. He subsequently developed an ileus. Blood cultures and wound culture grew Bacteroides. He also developed oliguric acute renal failure and required CVVH starting on 4/27 His course was further complicated by the development of a flutter for which she was placed on amiodarone.  Hospital Course:  Acute respiratory failure with hypercapnea In relation to HCAP admission which was treated completely and clinically resolved -Subsequently continued to be hypoxic which was suspected to be secondary to severe atelectasis  Septic shock due to Bacteroides Resolved -this was due to gallbladder infection and was treated appropriately with antibiotics  Acute Cholecystis with peri-gallbladder abscess due to dislodged percutaneous drain  s/p drain 4/26 per IR - prior drain 4/3 > 4/12  Acute kidney failure requiring dialysis Suspected to be due to sepsis and contrast exposure - he was undergoing dialysis at treatments per nephrology -no significant improvement had been noted in renal function yet  Atrial flutter w/ RVR CHADSVASC = 2 -  - heart rate had been out of control over the past 24-48 hours and therefore another bolus of amiodarone was given and he was subsequently maintained on the amiodarone infusion along with Cardizem -etiology was assisting with management -He was not on full anticoagulation (Coumadin was on hold) as his stool Hemoccult was positive  Severe COPD Tobacco abuse  Counseled on absolute need to discontinue tobacco  abuse permanently  Ileus Clinically resolved  Chronic polycythemia due to COPD/hypoxia - now anemic Due to critical illness and acute kidney failure -   Thrush Treatment initiated  Profound deconditioned state  Hepatitis B core antibody  positive This isolated positive result is unclear and could represent a resolved infection, false positive test, or even a chronic low-grade infection - will need to be followed as outpatient - follow-up LFTs in a.m.    Time: 60 min  Signed:  [facility] 12-22-2014, 2:23 PM

## 2015-01-02 DEATH — deceased

## 2016-01-07 IMAGING — CT CT ABD-PELV W/O CM
2 of 4 series · 16 of 46 positions shown, 18 images · non-contrast
Comparison: 01/27/2015 contrast study

CLINICAL DATA: Abdominal distention, vomiting.

EXAM:
CT ABDOMEN AND PELVIS WITHOUT CONTRAST
TECHNIQUE: Multidetector CT imaging of the abdomen and pelvis was performed
following the standard protocol without IV contrast.

[Series 2: rtn a/p w/o · axial · non-contrast · 0.98mm/px · z∈[-520,-34]mm · 13 of 107 slices shown, 15 images]
[im 5/107  soft-tissue]
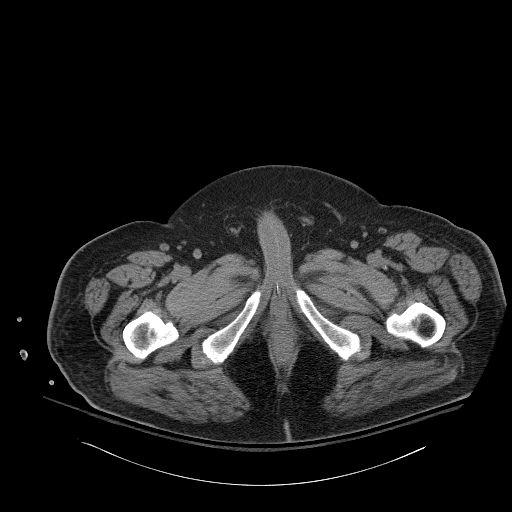
[im 5/107  bone]
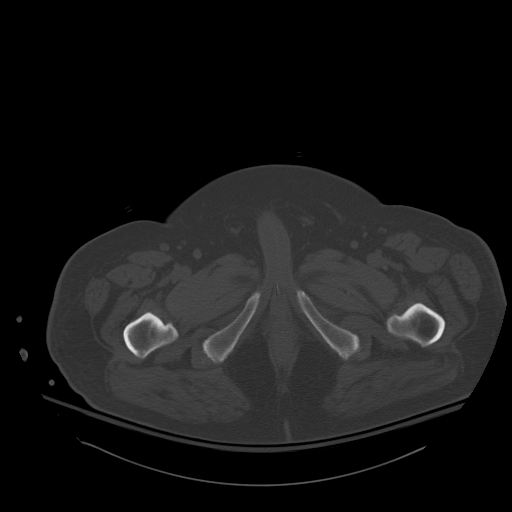
[im 14/107  soft-tissue]
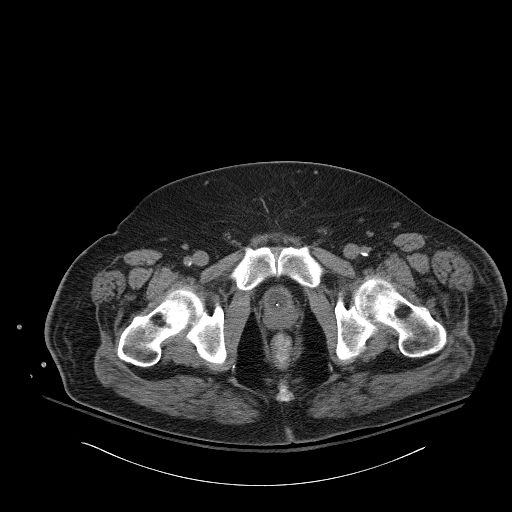
[im 23/107  soft-tissue]
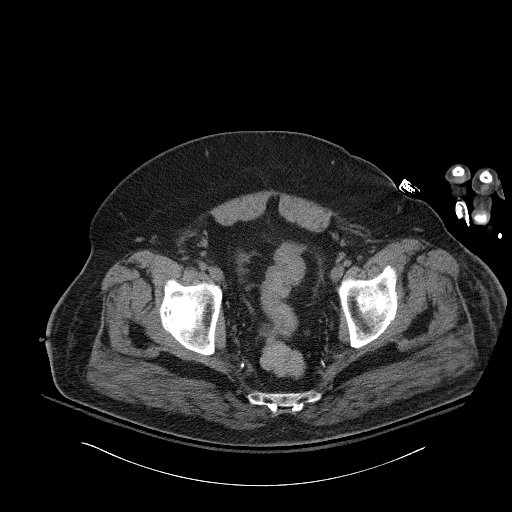
[im 31/107  soft-tissue]
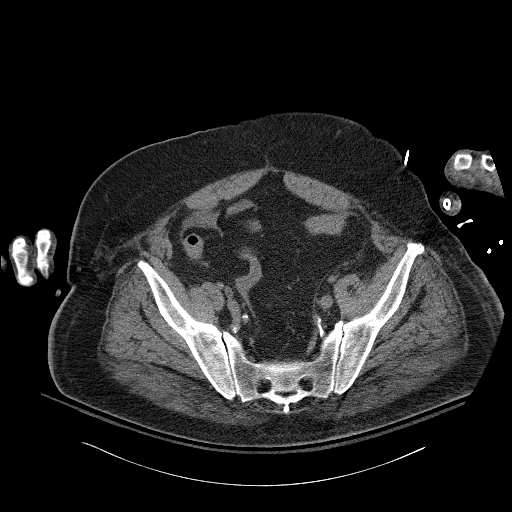
[im 36/107  soft-tissue]
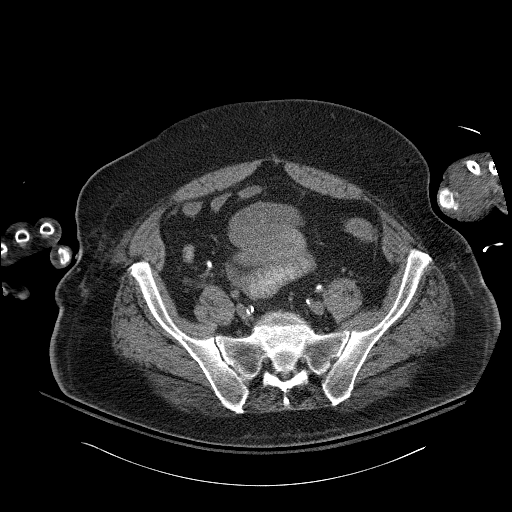
[im 45/107  soft-tissue]
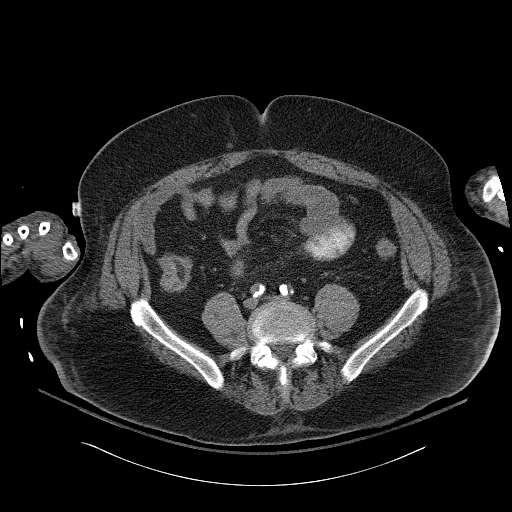
[im 54/107  soft-tissue]
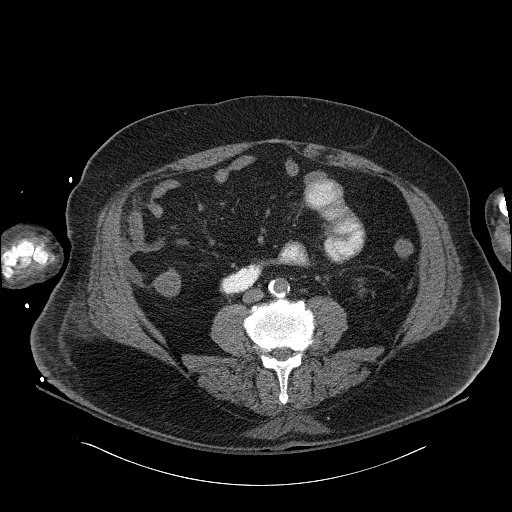
[im 62/107  soft-tissue]
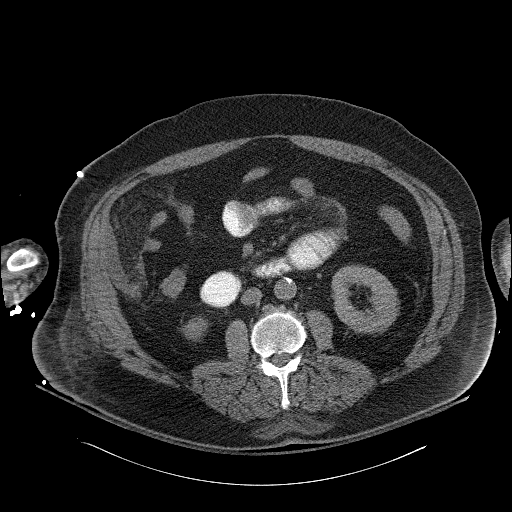
[im 71/107  soft-tissue]
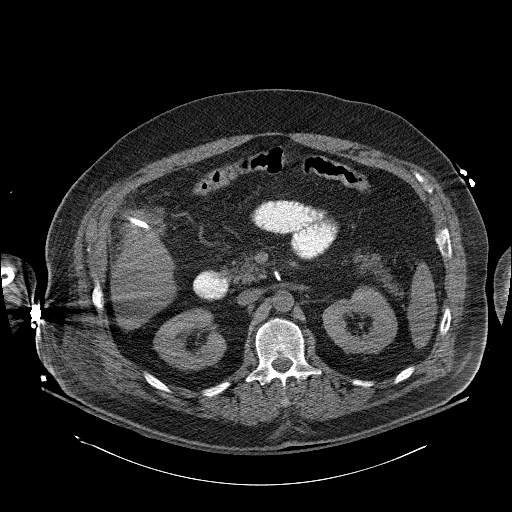
[im 71/107  bone]
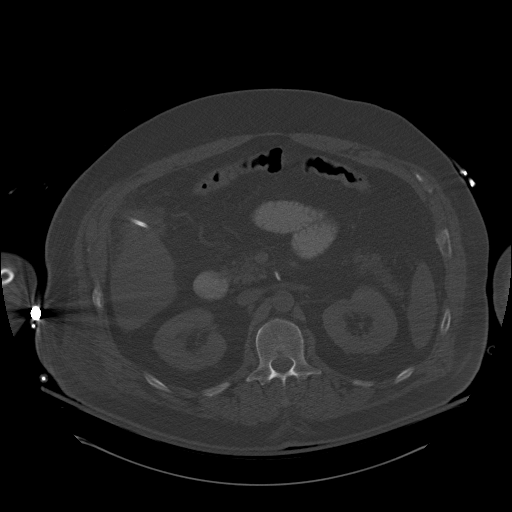
[im 76/107  soft-tissue]
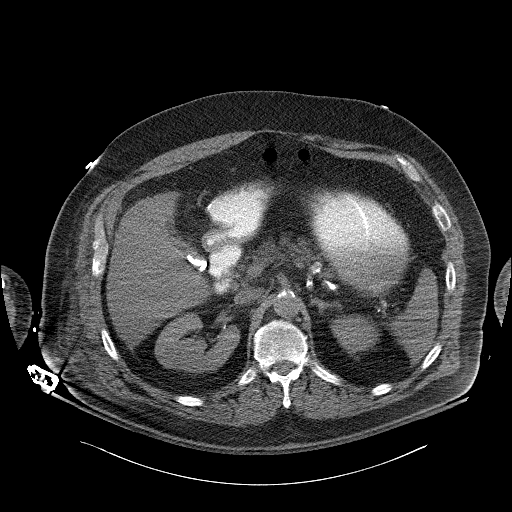
[im 84/107  soft-tissue]
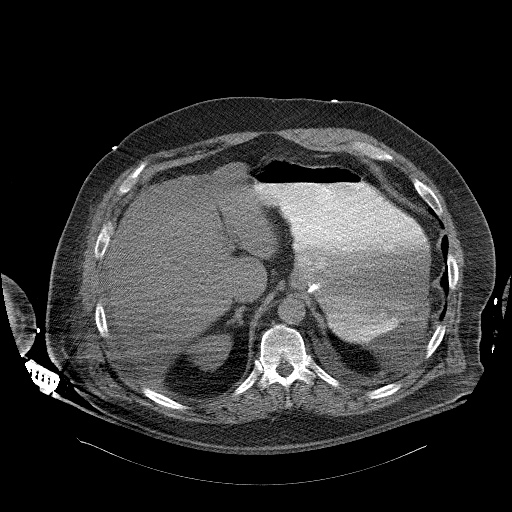
[im 93/107  soft-tissue]
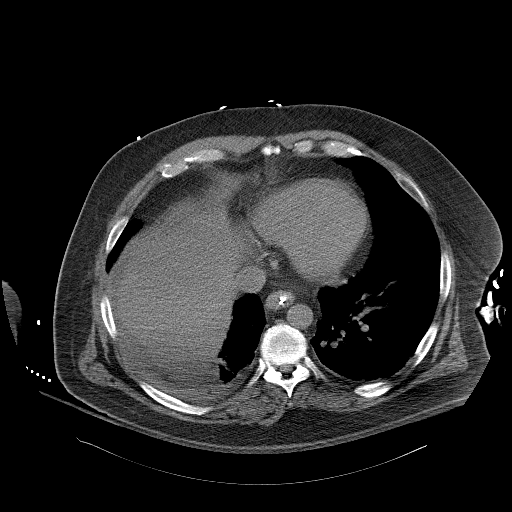
[im 102/107  soft-tissue]
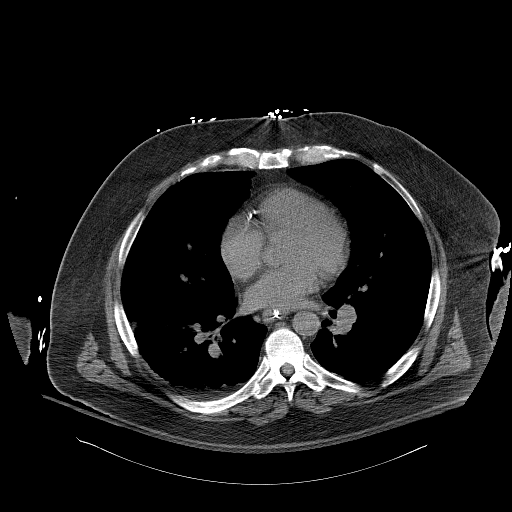

[Series 602: <mpr thick range> · coronal · 1.04mm/px · 3 of 112 slices shown]
[im 38/112  soft-tissue]
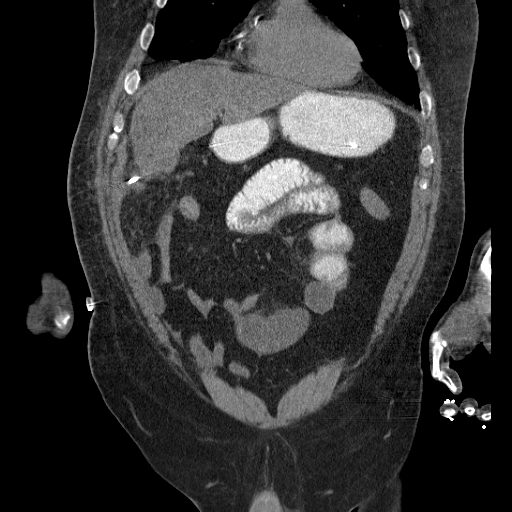
[im 50/112  soft-tissue]
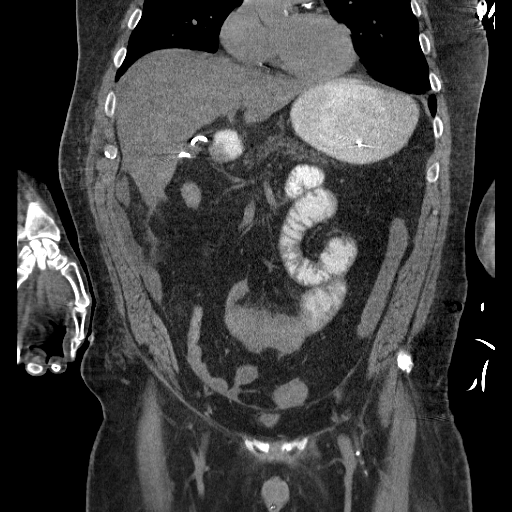
[im 62/112  soft-tissue]
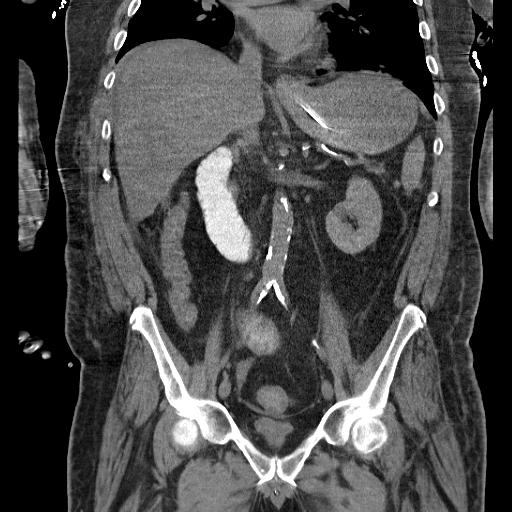

[16 of 46 positions shown; findings below may reference images not displayed]

FINDINGS: Lower chest: Emphysematous changes noted in the lung bases. Trace
bilateral pleural effusions, new since prior study. Bibasilar
atelectasis. Heart is normal size. Densely calcified coronary
arteries.

Hepatobiliary: Cholecystostomy tube has been placed into the
gallbladder. Gallbladder is decompressed. Small amount of
perihepatic ascites, new since prior study. No focal hepatic
abnormality visualized on this unenhanced study.

Pancreas: No focal abnormality or ductal dilatation.

Spleen: No focal abnormality.  Normal size.

Adrenals/Urinary Tract: No focal renal or adrenal abnormality. No
hydronephrosis. Urinary bladder is decompressed with Foley catheter
in place.

Stomach/Bowel: NG tube present in the stomach which is unremarkable.
Mildly prominent abdominal small bowel loops within the mid jejunum.
Questionable mild wall thickening in these mildly prominent small
bowel loops, best seen on images 66-75 of series 2. Transition to
normal caliber small bowel noted on image 66. Appendix is visualized
and unremarkable.

Vascular/Lymphatic: Aortic and iliac calcifications. No aneurysm. No
retroperitoneal or mesenteric adenopathy.

Reproductive: No mass or other significant abnormality.

Other: Trace free fluid in the pelvis and adjacent to the liver.

Musculoskeletal: No focal bone lesion or acute bony abnormality.
IMPRESSION: Mildly prominent central small bowel loops in the lower abdomen and
upper pelvis, new since prior study. There is questionable wall
thickening within these loops suggesting the possibility of
enteritis. This alternatively could represent early partial small
bowel obstruction, but no visible abnormality at the transition
point.

Interval placement of cholecystostomy tube with decompressed
gallbladder.

New trace bilateral pleural effusions and trace free fluid in the
abdomen and pelvis.

Coronary artery disease, COPD.

## 2016-01-13 IMAGING — DX DG CHEST 1V PORT
1 series · 1 of 1 positions shown · non-contrast
Comparison: 12/05/2014

CLINICAL DATA: Respiratory failure

EXAM:
PORTABLE CHEST - 1 VIEW

[chest ap]
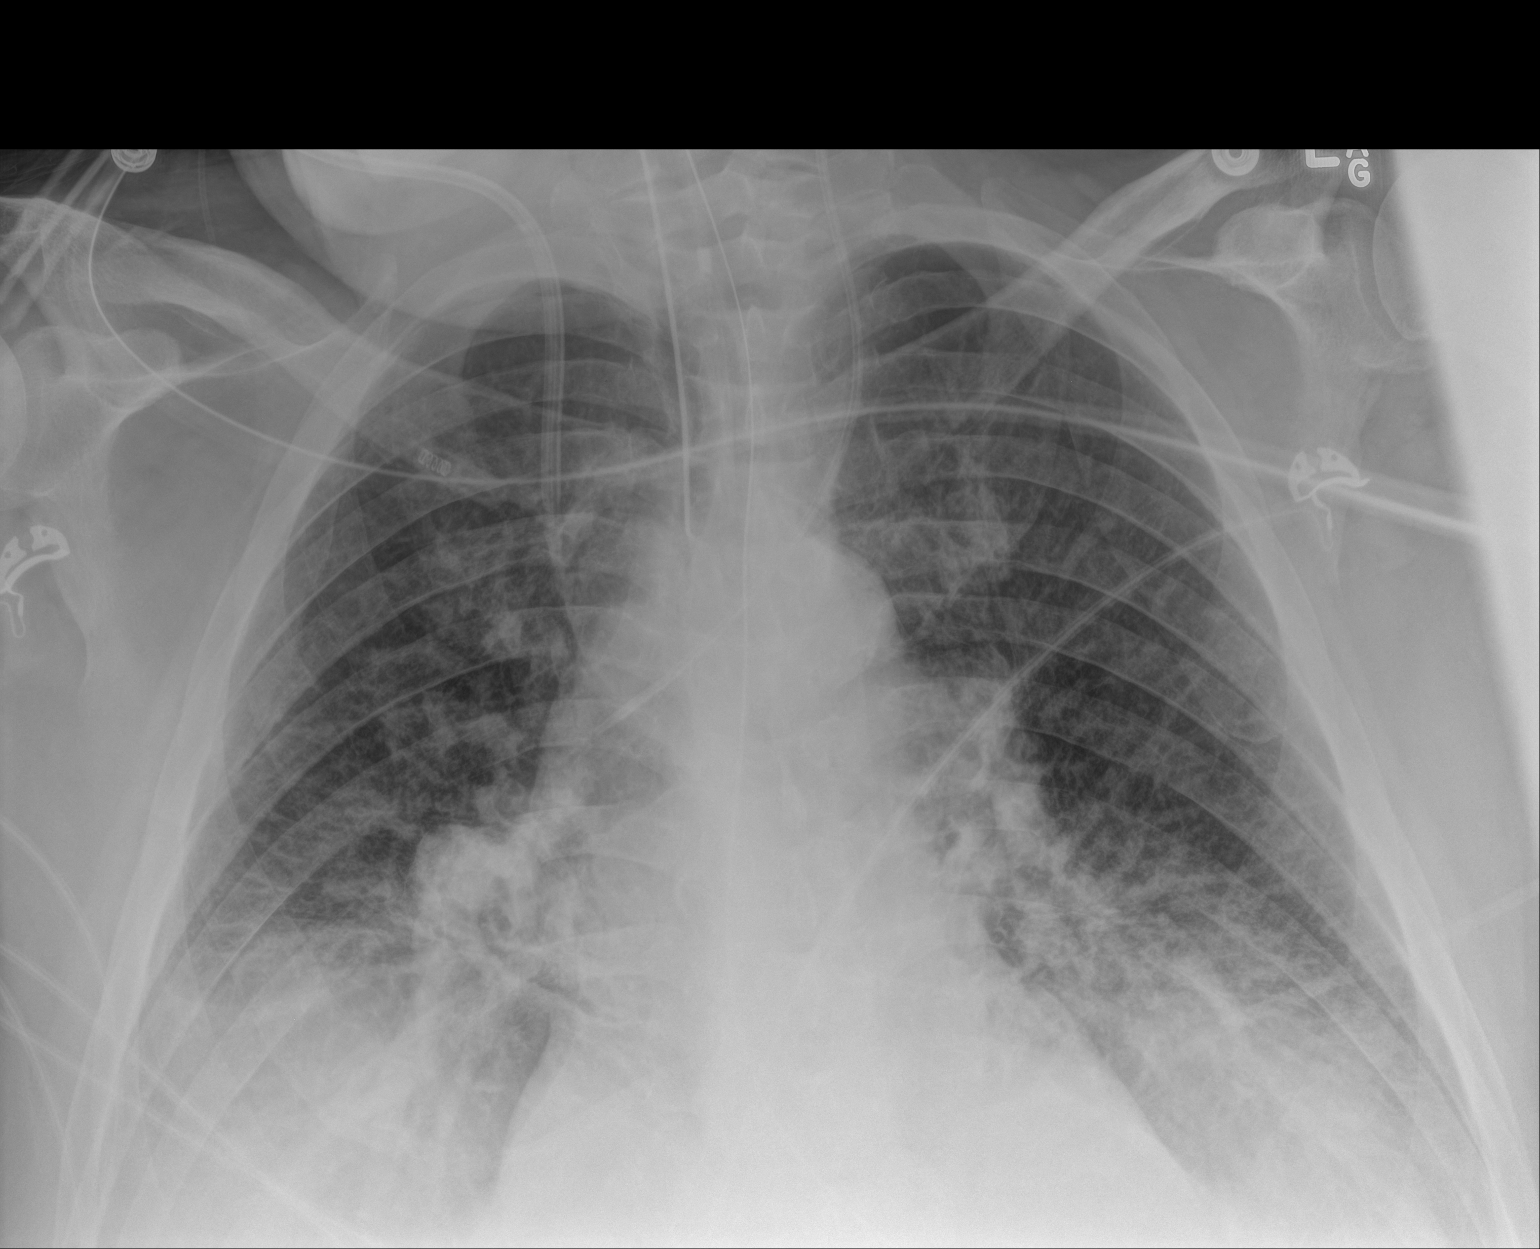

[1 of 1 positions shown; findings below may reference images not displayed]

FINDINGS: The endotracheal tube is 5.3 cm above the carina. There are
bilateral jugular central lines extending into the SVC. There is a
nasogastric tube extending below the diaphragm and off the inferior
edge of the image. There is no interval change in bibasilar airspace
opacities. There is continued vascular and interstitial congestive
change, without significant difference.
IMPRESSION: Support equipment appears satisfactorily positioned.

No interval difference in the moderate vascular/interstitial
congestive changes or the bibasilar airspace opacities.

## 2016-01-20 IMAGING — CR DG CHEST 1V PORT
2 series · 2 of 2 positions shown · non-contrast
Comparison: 12/07/2014.

CLINICAL DATA: 63-year-old male with pneumonia. Subsequent
encounter.

EXAM:
PORTABLE CHEST - 1 VIEW

[AP (1 of 2)]
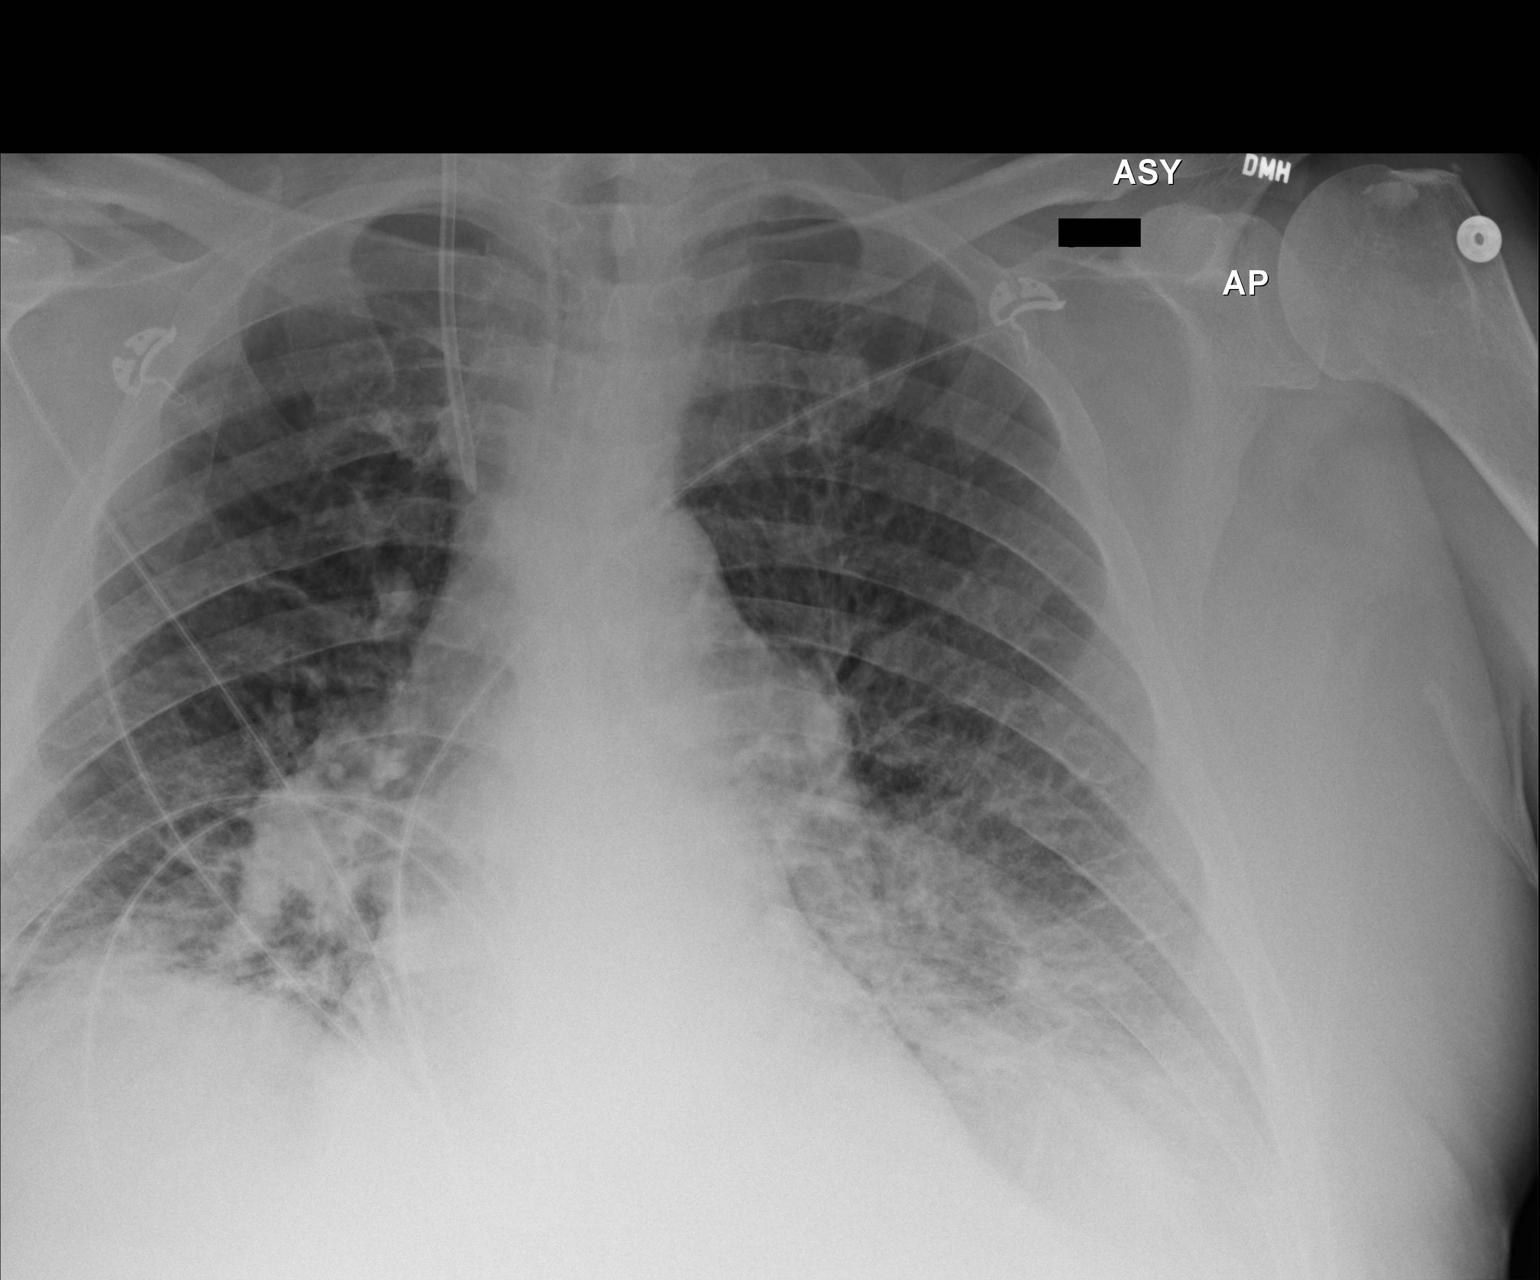

[AP (2 of 2)]
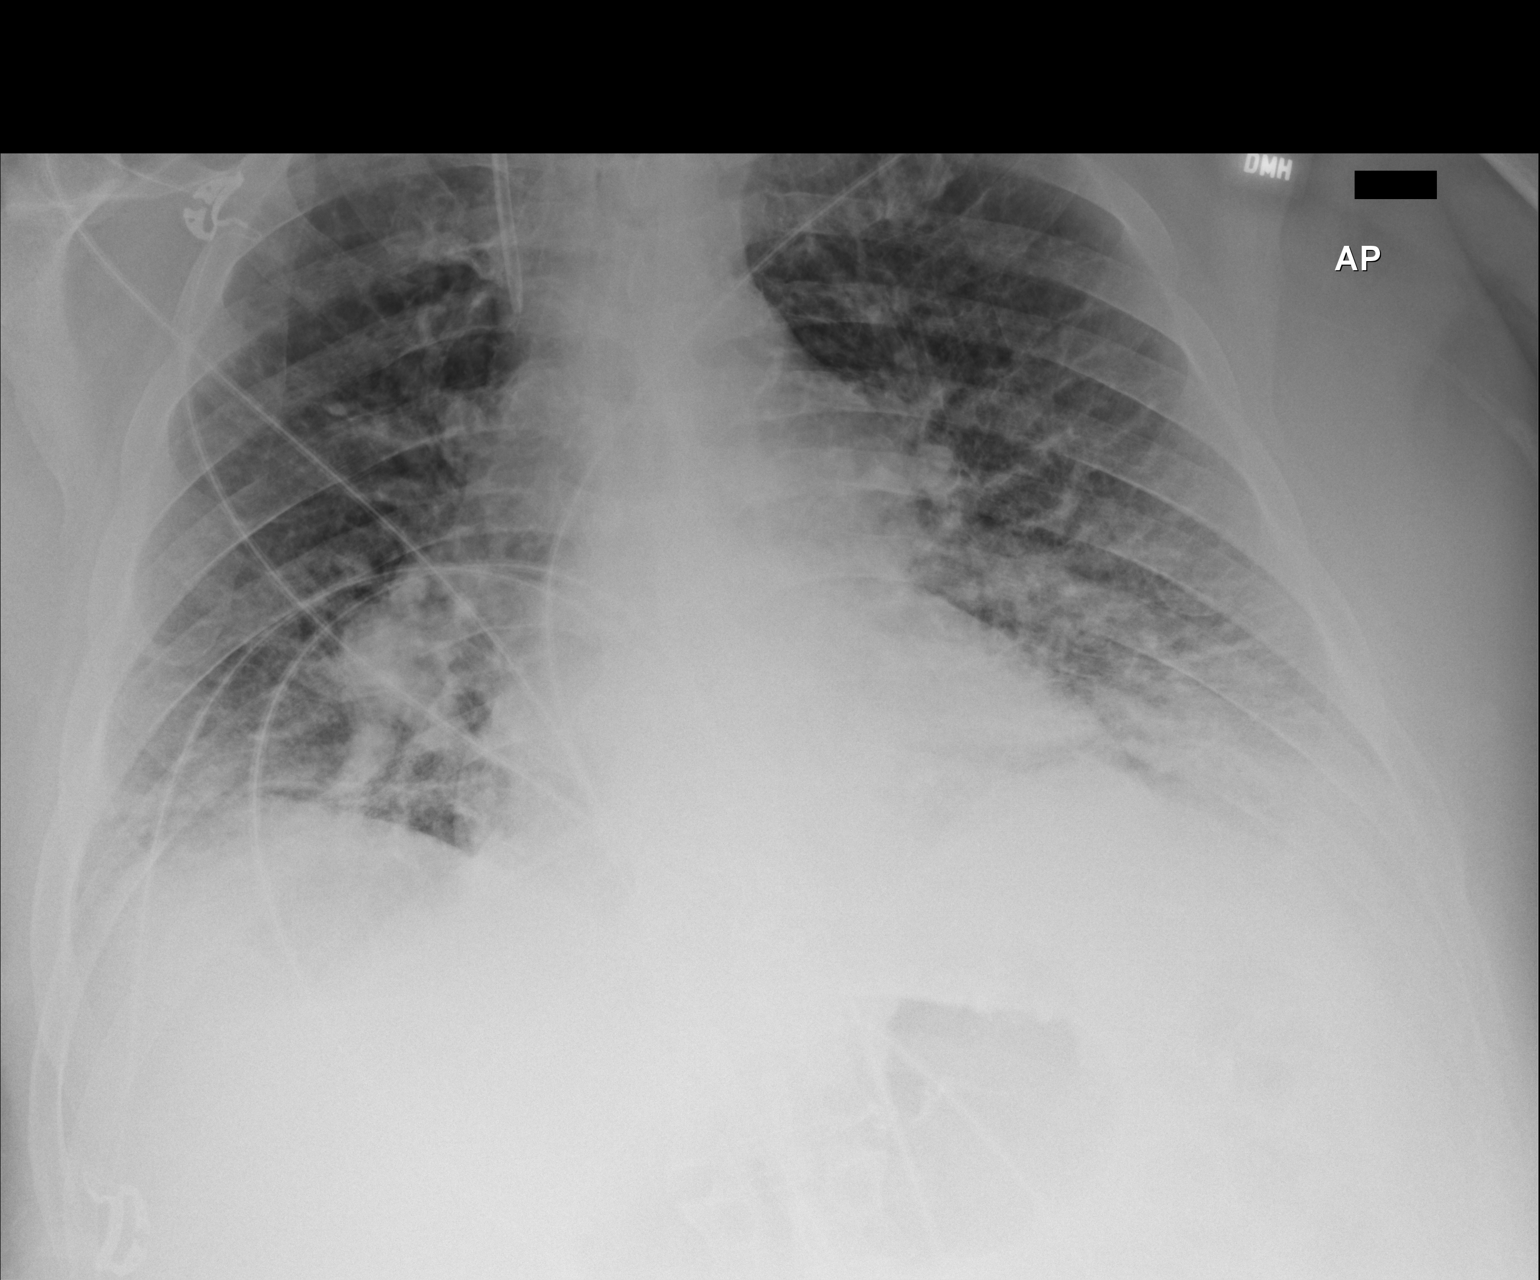

[2 of 2 positions shown; findings below may reference images not displayed]

FINDINGS: Right internal jugular catheter tip proximal superior vena cava
level. Left internal jugular catheter is been removed.

No gross pneumothorax detected.

Asymmetric airspace disease most notable lung bases may reflect
pulmonary edema although could not exclude superimposed infectious
infiltrate within the lung bases in the proper clinical setting.

Hilar prominence stable.

Heart size top-normal.
IMPRESSION: Left internal jugular catheter has been removed with remainder of
findings similar to prior exam as detailed above.

## 2016-01-24 IMAGING — US IR FLUORO GUIDE CV LINE*L*
1 series · 1 of 1 positions shown · non-contrast
Comparison: none

INDICATION: History of acute kidney injury (RMZ). Patient needs hemodialysis.
Temporary hemodialysis catheter was recently removed.

[Series 1: ir fluoro/shunt/fist · 1 of 1 slices shown]
[im 1/1]
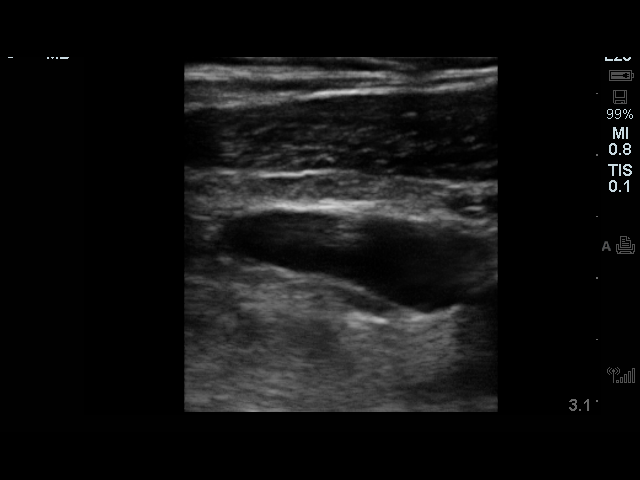

[1 of 1 positions shown; findings below may reference images not displayed]

EXAM:
FLUOROSCOPIC AND ULTRASOUND GUIDED PLACEMENT OF A TUNNELED DIALYSIS
CATHETER

FLUOROSCOPY TIME:  1 minutes, 17.2 mGy minutes

MEDICATIONS:
Ancef 2 g. As antibiotic prophylaxis, Ancef was ordered
pre-procedure and administered intravenously within one hour of
incision.

1 mg versed, 25 mcg fentanyl. A radiology nurse monitored the
patient for moderate sedation.

ANESTHESIA/SEDATION:
Moderate sedation time: 42 minutes

PROCEDURE:
Informed consent was obtained for placement of a tunneled dialysis
catheter. The patient was placed supine on the interventional table.
Ultrasound confirmed a patent right internal jugularvein. Ultrasound
images were obtained for documentation. The right side of the neck
was prepped and draped in a sterile fashion. The right side of the
neck was anesthetized with 1% lidocaine. Maximal barrier sterile
technique was utilized including caps, mask, sterile gowns, sterile
gloves, sterile drape, hand hygiene and skin antiseptic. A small
incision was made with #11 blade scalpel. A 21 gauge needle directed
into the right internal jugular vein with ultrasound guidance. A
micropuncture dilator set was placed. A 23 cm tip to cuff HemoSplit
catheter was selected. The skin below the right clavicle was
anesthetized and a small incision was made with an #11 blade
scalpel. A subcutaneous tunnel was formed to the vein dermatotomy
site. The catheter was brought through the tunnel. The vein
dermatotomy site was dilated to accommodate a peel-away sheath. The
catheter was placed through the peel-away sheath and directed into
the central venous structures. The tip of the catheter was placed in
the lower SVC with fluoroscopy. Fluoroscopic images were obtained
for documentation. Both lumens were found to aspirate and flush
well. The proper amount of heparin was flushed in both lumens. The
vein dermatotomy site was closed using a single layer of absorbable
suture and Dermabond. The catheter was secured to the skin using
Prolene suture.
FINDINGS: Catheter tip in the SVC.

Estimated blood loss: Minimal

COMPLICATIONS:
None
IMPRESSION: Successful placement of a right jugular tunneled dialysis catheter
using ultrasound and fluoroscopic guidance.
